# Patient Record
Sex: Female | Born: 1940 | Race: White | Hispanic: No | Smoking: Never smoker
Health system: Southern US, Community
[De-identification: ages and names within clinical notes are randomized; demographics above are authoritative.]

## PROBLEM LIST (undated history)

## (undated) DIAGNOSIS — K529 Noninfective gastroenteritis and colitis, unspecified: Secondary | ICD-10-CM

## (undated) DIAGNOSIS — K589 Irritable bowel syndrome without diarrhea: Secondary | ICD-10-CM

## (undated) DIAGNOSIS — R7303 Prediabetes: Secondary | ICD-10-CM

## (undated) DIAGNOSIS — D126 Benign neoplasm of colon, unspecified: Secondary | ICD-10-CM

## (undated) DIAGNOSIS — I10 Essential (primary) hypertension: Secondary | ICD-10-CM

## (undated) DIAGNOSIS — E7800 Pure hypercholesterolemia, unspecified: Secondary | ICD-10-CM

## (undated) DIAGNOSIS — M1990 Unspecified osteoarthritis, unspecified site: Secondary | ICD-10-CM

## (undated) DIAGNOSIS — R002 Palpitations: Secondary | ICD-10-CM

## (undated) DIAGNOSIS — K5790 Diverticulosis of intestine, part unspecified, without perforation or abscess without bleeding: Secondary | ICD-10-CM

## (undated) DIAGNOSIS — N390 Urinary tract infection, site not specified: Secondary | ICD-10-CM

## (undated) HISTORY — PX: COLONOSCOPY: SHX174

## (undated) HISTORY — DX: Palpitations: R00.2

## (undated) HISTORY — DX: Benign neoplasm of colon, unspecified: D12.6

## (undated) HISTORY — DX: Diverticulosis of intestine, part unspecified, without perforation or abscess without bleeding: K57.90

## (undated) HISTORY — PX: BILATERAL CARPAL TUNNEL RELEASE: SHX6508

## (undated) HISTORY — DX: Irritable bowel syndrome, unspecified: K58.9

---
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
## 2000-07-27 ENCOUNTER — Other Ambulatory Visit: Admission: RE | Admit: 2000-07-27 | Discharge: 2000-07-27 | Payer: Self-pay | Admitting: Obstetrics and Gynecology

## 2000-07-29 ENCOUNTER — Ambulatory Visit (HOSPITAL_COMMUNITY): Admission: RE | Admit: 2000-07-29 | Discharge: 2000-07-29 | Payer: Self-pay | Admitting: Obstetrics and Gynecology

## 2000-07-29 ENCOUNTER — Encounter: Payer: Self-pay | Admitting: Obstetrics and Gynecology

## 2001-08-01 ENCOUNTER — Other Ambulatory Visit: Admission: RE | Admit: 2001-08-01 | Discharge: 2001-08-01 | Payer: Self-pay | Admitting: Internal Medicine

## 2002-11-27 ENCOUNTER — Other Ambulatory Visit: Admission: RE | Admit: 2002-11-27 | Discharge: 2002-11-27 | Payer: Self-pay | Admitting: Obstetrics and Gynecology

## 2003-10-01 ENCOUNTER — Encounter (INDEPENDENT_AMBULATORY_CARE_PROVIDER_SITE_OTHER): Payer: Self-pay | Admitting: Gastroenterology

## 2003-12-31 ENCOUNTER — Other Ambulatory Visit: Admission: RE | Admit: 2003-12-31 | Discharge: 2003-12-31 | Payer: Self-pay | Admitting: Obstetrics and Gynecology

## 2005-01-29 ENCOUNTER — Other Ambulatory Visit: Admission: RE | Admit: 2005-01-29 | Discharge: 2005-01-29 | Payer: Self-pay | Admitting: Obstetrics and Gynecology

## 2005-09-06 ENCOUNTER — Encounter: Admission: RE | Admit: 2005-09-06 | Discharge: 2005-09-06 | Payer: Self-pay | Admitting: Obstetrics and Gynecology

## 2006-03-09 ENCOUNTER — Other Ambulatory Visit: Admission: RE | Admit: 2006-03-09 | Discharge: 2006-03-09 | Payer: Self-pay | Admitting: Obstetrics and Gynecology

## 2007-10-02 HISTORY — PX: NM MYOCAR PERF WALL MOTION: HXRAD629

## 2009-03-07 ENCOUNTER — Ambulatory Visit: Payer: Self-pay | Admitting: Gastroenterology

## 2009-04-03 ENCOUNTER — Ambulatory Visit: Payer: Self-pay | Admitting: Gastroenterology

## 2009-04-03 DIAGNOSIS — D126 Benign neoplasm of colon, unspecified: Secondary | ICD-10-CM | POA: Insufficient documentation

## 2009-04-07 ENCOUNTER — Telehealth: Payer: Self-pay | Admitting: Gastroenterology

## 2009-04-16 ENCOUNTER — Ambulatory Visit: Payer: Self-pay | Admitting: Gastroenterology

## 2010-10-29 HISTORY — PX: US ECHOCARDIOGRAPHY: HXRAD669

## 2010-12-15 NOTE — Procedures (Signed)
Summary: Colonoscopy   Colonoscopy  Procedure date:  10/01/2003  Findings:      Results: Diverticulosis.       Location:  [facility].    Comments:      Repeat colonoscopy in 5 years.   Procedures Next Due Date:    Colonoscopy: 10/2008 Patient Name: Jade, Hernandez MRN:  Procedure Procedures: Colonoscopy CPT: 47829.  Personnel: Endoscopist: Ulyess Mort, MD.  Exam Location: Exam performed in Outpatient Clinic. Outpatient  Patient Consent: Procedure, Alternatives, Risks and Benefits discussed, consent obtained, from patient. Consent was obtained by the RN.  Indications  Surveillance of: Adenomatous Polyp(s).  History  Current Medications: Patient is not currently taking Coumadin.  Pre-Exam Physical: Entire physical exam was normal.  Exam Exam: Extent of exam reached: Cecum, extent intended: Cecum.  The cecum was identified by appendiceal orifice and IC valve. Colon retroflexion performed. Images were not taken. ASA Classification: II. Tolerance: good.  Monitoring: Pulse and BP monitoring, Oximetry used. Supplemental O2 given.  Colon Prep Prep results: good.  Sedation Meds: Patient assessed and found to be appropriate for moderate (conscious) sedation. Fentanyl 125 mcg. given IV. Versed 12 given IV.  Findings - NOT SEEN ON EXAM: Cecum to Rectum. Polyps, AVM's, Colitis, Tumors, Melanosis, Crohn's, Hemorrhoids, Comments: minnimal diverticulosis   long,redundant colon.   Assessment Abnormal examination, see findings above.  Events  Unplanned Interventions: No intervention was required.  Unplanned Events: There were no complications. Plans Medication Plan: Continue current medications.  Patient Education: Patient given standard instructions for: Diverticulosis. Yearly hemoccult testing recommended. Patient instructed to get routine colonoscopy every 5 years.  Disposition: After procedure patient sent to recovery. After recovery  patient sent home.     cc: Desma Maxim, MD  This report was created from the original endoscopy report, which was reviewed and signed by the above listed endoscopist.

## 2010-12-15 NOTE — Assessment & Plan Note (Signed)
Summary: DISCUSS COL...AS.   History of Present Illness Visit Type: Initial Visit Primary GI MD: Melvia Heaps MD [facility] Primary Provider: Lynnea Ferrier Chief Complaint: Consult Colon Mrs. Wahler is a pleasant 70 year old female referred at the request of Dr.Cosiano  for followup colonoscopy.  She has a history of colon polyps.  On last examination in 2004 no polyps were seen.  The patient has no complaints of change in bowel habits, melena or hematochezia.  She has occasional discomfort in the right upper quadrant.   GI Review of Systems    Reports abdominal pain and  acid reflux.     Location of  Abdominal pain: LUQ.    Denies belching, bloating, chest pain, dysphagia with liquids, dysphagia with solids, heartburn, loss of appetite, nausea, vomiting, vomiting blood, weight loss, and  weight gain.        Denies anal fissure, black tarry stools, change in bowel habit, constipation, diarrhea, diverticulosis, fecal incontinence, heme positive stool, hemorrhoids, irritable bowel syndrome, jaundice, light color stool, liver problems, rectal bleeding, and  rectal pain. Preventive Screening-Counseling & Management     Smoking Status: never      Drug Use:  no.      Current Medications (verified): 1)  Metoprolol Tartrate 50 Mg Tabs (Metoprolol Tartrate) .Marland Kitchen.. 1 1/2 Tablet By Mouth Once Daily 2)  Hydrochlorothiazide 25 Mg Tabs (Hydrochlorothiazide) .Marland Kitchen.. 1 Tablet By Mouth Once Daily 3)  Simvastatin 40 Mg Tabs (Simvastatin) .Marland Kitchen.. 1 Tablet By Mouth Once Daily 4)  Adult Aspirin Ec Low Strength 81 Mg Tbec (Aspirin) .Marland Kitchen.. 1 Tablet By Mouth Once Daily 5)  Aciphex 20 Mg Tbec (Rabeprazole Sodium) .Marland Kitchen.. 1 Tablet By Mouth Every Other Day 6)  Mobic 15 Mg Tabs (Meloxicam) .Marland Kitchen.. 1 Tablet By Mouth At Bedtime 7)  Omega-3 1000 Mg Caps (Omega-3 Fatty Acids) .Marland Kitchen.. 1 Capsule By Mouth Three Times A Day 8)  Calcium 600/vitamin D 600-400 Mg-Unit Tabs (Calcium Carbonate-Vitamin D) .Marland Kitchen.. 1 Tablet By Mouth Three Times A  Day 9)  Vitamin D 1000 Unit Tabs (Cholecalciferol) .Marland Kitchen.. 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        Alcohol Use - no       Daily Caffeine Use       Illicit Drug Use - no    Smoking Status:  never    Drug Use:  no  Review of Systems       The patient complains of allergy/sinus, arthritis/joint pain, and urination - excessive.         All other systems were reviewed and were negative   Vital Signs:  Patient profile:   70 year old female Height:      64 inches Weight:      180.25 pounds BMI:     31.05 Pulse rate:   80 / minute Pulse  rhythm:   regular BP sitting:   126 / 78  (left arm)  Vitals Entered By: Milford Cage CMA (Apr 03, 2009 3:06 PM)  Physical Exam  Additional Exam:  She is a healthy-appearing female  skin: anicteric HEENT: normocephalic; PEERLA; no nasal or pharyngeal abnormalities neck: supple nodes: no cervical lymphadenopathy chest: clear to ausculatation and percussion heart: no murmurs, gallops, or rubs abd: soft, nontender; BS normoactive; no abdominal masses, tenderness, organomegaly rectal: deferred ext: no cynanosis, clubbing, edema skeletal: no deformities neuro: oriented x 3; no focal abnormalities    Impression & Recommendations:  Problem # 1:  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  2)  Conscious Sedation brochure given.  3)  Your procedure is scheduled for 04/16/2009 at 3pm 4)  You can pick up your Movi-Prep to day from your pharmacy 5)  cc Dr. Pecola Lawless 6)  The medication list was reviewed and reconciled.  All changed / newly prescribed medications were explained.  A complete medication list was provided to the patient / caregiver. Prescriptions: MOVIPREP 100 GM  SOLR (PEG-KCL-NACL-NASULF-NA ASC-C) As per prep instructions.  #1 x 0   Entered by:   Merri Ray CMA   Authorized by:   Louis Meckel MD   Signed by:   Merri Ray CMA on 04/03/2009   Method used:   Electronically to        [facility] [address] [phone]* (retail)       [address]       [place], [place]  [address]       Ph: [phone] or [phone]       Fax: [phone]   RxID:   [id number]

## 2010-12-15 NOTE — Procedures (Signed)
Summary: Colonoscopy   Colonoscopy  Procedure date:  04/16/2009  Findings:      [facility]    Procedures Next Due Date:    Colonoscopy: 04/2019  COLONOSCOPY PROCEDURE REPORT  PATIENT:  Jade Hernandez, Jade Hernandez  MR#:  [id number] BIRTHDATE:   Jun 23, 1941, 68 yrs. old   GENDER:   female  ENDOSCOPIST:   Barbette Hair. Arlyce Dice, MD Referred by: Desma Maxim, M.D.  PROCEDURE DATE:  04/16/2009 PROCEDURE:  Colonoscopy, diagnostic ASA CLASS:   Class II INDICATIONS: history of pre-cancerous (adenomatous) colon polyps Last exam 2004  MEDICATIONS:    Fentanyl 75 mcg IV, Versed 10 mg IV  DESCRIPTION OF PROCEDURE:   After the risks benefits and alternatives of the procedure were thoroughly explained, informed consent was obtained.  Digital rectal exam was performed and revealed no abnormalities.   The LB [id number] endoscope was introduced through the anus and advanced to the cecum, which was identified by both the appendix and ileocecal valve, without limitations.  The quality of the prep was excellent, using MoviPrep.  The instrument was then slowly withdrawn as the colon was fully examined. <<PROCEDUREIMAGES>>                    <<OLD IMAGES>>  FINDINGS:  Scattered diverticula were found in the proximal transverse colon (see image4).  This was otherwise a normal examination of the colon (see image1, image2, image3, image5, image6, image7, image8, image10, image12, and image13).   Retroflexed views in the rectum revealed no abnormalities.    The scope was then withdrawn from the patient and the procedure completed.  COMPLICATIONS:   None  ENDOSCOPIC IMPRESSION:  1) Diverticula, scattered in the proximal transverse colon  2) Otherwise normal examination RECOMMENDATIONS:  1) colonoscopy  10 years  REPEAT EXAM:   In 10 year(s) for Colonoscopy.   _______________________________ Barbette Hair. Arlyce Dice, MD  CC:

## 2010-12-15 NOTE — Miscellaneous (Signed)
Summary: Pre Meds removed(VFW)  Clinical Lists Changes  Medications: Removed medication of MIRALAX   POWD (POLYETHYLENE GLYCOL 3350) As per prep  instructions. Removed medication of DULCOLAX 5 MG  TBEC (BISACODYL) Day before procedure take 2 at 3pm and 2 at 8pm. Removed medication of REGLAN 10 MG  TABS (METOCLOPRAMIDE HCL) As per prep instructions.

## 2010-12-15 NOTE — Miscellaneous (Signed)
Summary: PV; mirlax  Clinical Lists Changes  Medications: Added new medication of MIRALAX   POWD (POLYETHYLENE GLYCOL 3350) As per prep  instructions. - Signed Added new medication of DULCOLAX 5 MG  TBEC (BISACODYL) Day before procedure take 2 at 3pm and 2 at 8pm. - Signed Added new medication of REGLAN 10 MG  TABS (METOCLOPRAMIDE HCL) As per prep instructions. - Signed Rx of MIRALAX   POWD (POLYETHYLENE GLYCOL 3350) As per prep  instructions.;  #255gm x 0;  Signed;  Entered by: Kyra Searles RN II;  Authorized by: Louis Meckel MD;  Method used: [facility]  [address] [phone]*, [address], [place], [place]  [address], Ph: [phone] or [phone], Fax: [phone] Rx of DULCOLAX 5 MG  TBEC (BISACODYL) Day before procedure take 2 at 3pm and 2 at 8pm.;  #4 x 0;  Signed;  Entered by: Kyra Searles RN II;  Authorized by: Louis Meckel MD;  Method used: [facility]  [address] [phone]*, [address], [place], [place]  [address], Ph: [phone] or [phone], Fax: [phone] Rx of REGLAN 10 MG  TABS (METOCLOPRAMIDE HCL) As per prep instructions.;  #2 x 0;  Signed;  Entered by: Kyra Searles RN II;  Authorized by: Louis Meckel MD;  Method used: [facility]  [address] [phone]*, [address] [place], [place]  [address], Ph: [phone] or [phone], Fax: [phone] Observations: Added new observation of NKA: T (03/07/2009 15:47)    Prescriptions: REGLAN 10 MG  TABS (METOCLOPRAMIDE HCL) As per prep instructions.  #2 x 0   Entered by:   Kyra Searles RN II   Authorized by:   Louis Meckel MD   Signed by:   Kyra Searles RN II on 03/07/2009   Method used:   [facility]        [facility]  [address] [phone]* (retail)       [address]       [place], [place]  [address]       Ph: [phone] or [phone]       Fax: [phone]   RxID:   [id number] DULCOLAX 5 MG  TBEC (BISACODYL) Day before procedure take 2 at 3pm  and 2 at 8pm.  #4 x 0   Entered by:   Kyra Searles RN II   Authorized by:   Louis Meckel MD   Signed by:   Kyra Searles RN II on 03/07/2009   Method used:   [facility]        [facility]  [address] [phone]* (retail)       [address]       [place], [place]  [address]       Ph: [phone] or [phone]       Fax: [phone]   RxID:   [id number] MIRALAX   POWD (POLYETHYLENE GLYCOL 3350) As per prep  instructions.  #255gm x 0   Entered by:   Kyra Searles RN II   Authorized by:   Louis Meckel MD   Signed by:   Kyra Searles RN II on 03/07/2009   Method used:   [facility]        [facility]  [address] [phone][facility] (retail)       [address]       [place], [place]  [address]       Ph: [phone] or [phone]  Fax: [phone]   RxID:   [id number]   Appended Document: PV; mirlax Pt decided to schedule dr's appointment to discuss other options to colonoscopy (XRAY; camera, etc); procedure cancelled; called pharmacy; spoke with Kenard Gower; all meds entered for this procedure have been cancelled

## 2010-12-15 NOTE — Letter (Signed)
Summary: Results Letter   Gastroenterology  [address] [place], [place] [address]   Phone: [phone]  Fax: [phone]        Apr 03, 2009 MRN: [id number]    [facility] [address] [place], [place]  [address]    Dear Ms. Jade Hernandez,  It is my pleasure to have treated you recently as a new patient in my office. I appreciate your confidence and the opportunity to participate in your care.  Since I do have a busy inpatient endoscopy schedule and office schedule, my office hours vary weekly. I am, however, available for emergency calls everyday through my office. If I am not available for an urgent office appointment, another one of our gastroenterologist will be able to assist you.  My well-trained staff are prepared to help you at all times. For emergencies after office hours, a physician from our Gastroenterology section is always available through my 24 hour answering service  Once again I welcome you as a new patient and I look forward to a happy and healthy relationship             Sincerely,  Louis Meckel MD  This letter has been electronically signed by your physician.  Appended Document: Results Letter letter mailed

## 2010-12-15 NOTE — Progress Notes (Signed)
Summary: updates meds  Phone Note Call from Patient Call back at [phone]   Caller: Patient Call For: Shacara Cozine Reason for Call: Talk to Nurse Summary of Call: Patient wants to speak to nurse regarding medication she is taking and does not know if she told the nurse bout it.( procedure is on 04-16-09) Initial call taken by: Tawni Levy,  Apr 07, 2009 9:43 AM  Follow-up for Phone Call        Pt instructed to write down name of med and bring with her the day of the procedure. Follow-up by: Ezra Sites RN,  Apr 07, 2009 10:46 AM

## 2011-12-07 DIAGNOSIS — E782 Mixed hyperlipidemia: Secondary | ICD-10-CM | POA: Diagnosis not present

## 2011-12-09 DIAGNOSIS — M899 Disorder of bone, unspecified: Secondary | ICD-10-CM | POA: Diagnosis not present

## 2011-12-09 DIAGNOSIS — M949 Disorder of cartilage, unspecified: Secondary | ICD-10-CM | POA: Diagnosis not present

## 2011-12-09 DIAGNOSIS — M1710 Unilateral primary osteoarthritis, unspecified knee: Secondary | ICD-10-CM | POA: Diagnosis not present

## 2011-12-31 DIAGNOSIS — R300 Dysuria: Secondary | ICD-10-CM | POA: Diagnosis not present

## 2011-12-31 DIAGNOSIS — N318 Other neuromuscular dysfunction of bladder: Secondary | ICD-10-CM | POA: Diagnosis not present

## 2012-01-18 DIAGNOSIS — M949 Disorder of cartilage, unspecified: Secondary | ICD-10-CM | POA: Diagnosis not present

## 2012-01-18 DIAGNOSIS — M25569 Pain in unspecified knee: Secondary | ICD-10-CM | POA: Diagnosis not present

## 2012-01-18 DIAGNOSIS — M899 Disorder of bone, unspecified: Secondary | ICD-10-CM | POA: Diagnosis not present

## 2012-01-20 DIAGNOSIS — R3915 Urgency of urination: Secondary | ICD-10-CM | POA: Diagnosis not present

## 2012-01-20 DIAGNOSIS — R300 Dysuria: Secondary | ICD-10-CM | POA: Diagnosis not present

## 2012-01-20 DIAGNOSIS — N390 Urinary tract infection, site not specified: Secondary | ICD-10-CM | POA: Diagnosis not present

## 2012-01-20 DIAGNOSIS — N3020 Other chronic cystitis without hematuria: Secondary | ICD-10-CM | POA: Diagnosis not present

## 2012-02-03 DIAGNOSIS — N390 Urinary tract infection, site not specified: Secondary | ICD-10-CM | POA: Diagnosis not present

## 2012-03-29 DIAGNOSIS — E669 Obesity, unspecified: Secondary | ICD-10-CM | POA: Diagnosis not present

## 2012-03-29 DIAGNOSIS — Z0000 Encounter for general adult medical examination without abnormal findings: Secondary | ICD-10-CM | POA: Diagnosis not present

## 2012-03-29 DIAGNOSIS — E782 Mixed hyperlipidemia: Secondary | ICD-10-CM | POA: Diagnosis not present

## 2012-03-29 DIAGNOSIS — I10 Essential (primary) hypertension: Secondary | ICD-10-CM | POA: Diagnosis not present

## 2012-03-29 DIAGNOSIS — M1990 Unspecified osteoarthritis, unspecified site: Secondary | ICD-10-CM | POA: Diagnosis not present

## 2012-04-19 DIAGNOSIS — Z1231 Encounter for screening mammogram for malignant neoplasm of breast: Secondary | ICD-10-CM | POA: Diagnosis not present

## 2012-06-17 ENCOUNTER — Encounter (HOSPITAL_COMMUNITY): Payer: Self-pay | Admitting: *Deleted

## 2012-06-17 ENCOUNTER — Observation Stay (HOSPITAL_COMMUNITY)
Admission: EM | Admit: 2012-06-17 | Discharge: 2012-06-18 | Disposition: A | Discharge status: Home / Self Care | Payer: Medicare Other | Attending: Internal Medicine | Admitting: Internal Medicine

## 2012-06-17 DIAGNOSIS — K5730 Diverticulosis of large intestine without perforation or abscess without bleeding: Secondary | ICD-10-CM | POA: Diagnosis not present

## 2012-06-17 DIAGNOSIS — I10 Essential (primary) hypertension: Secondary | ICD-10-CM | POA: Diagnosis not present

## 2012-06-17 DIAGNOSIS — K922 Gastrointestinal hemorrhage, unspecified: Secondary | ICD-10-CM | POA: Diagnosis not present

## 2012-06-17 DIAGNOSIS — M1710 Unilateral primary osteoarthritis, unspecified knee: Secondary | ICD-10-CM | POA: Diagnosis not present

## 2012-06-17 DIAGNOSIS — K5900 Constipation, unspecified: Secondary | ICD-10-CM | POA: Diagnosis present

## 2012-06-17 DIAGNOSIS — M179 Osteoarthritis of knee, unspecified: Secondary | ICD-10-CM | POA: Diagnosis present

## 2012-06-17 DIAGNOSIS — K5790 Diverticulosis of intestine, part unspecified, without perforation or abscess without bleeding: Secondary | ICD-10-CM

## 2012-06-17 DIAGNOSIS — D126 Benign neoplasm of colon, unspecified: Secondary | ICD-10-CM

## 2012-06-17 HISTORY — DX: Unspecified osteoarthritis, unspecified site: M19.90

## 2012-06-17 HISTORY — DX: Pure hypercholesterolemia, unspecified: E78.00

## 2012-06-17 HISTORY — DX: Essential (primary) hypertension: I10

## 2012-06-17 HISTORY — DX: Urinary tract infection, site not specified: N39.0

## 2012-06-17 LAB — COMPREHENSIVE METABOLIC PANEL
ALT: 12 U/L (ref 0–35)
AST: 16 U/L (ref 0–37)
Albumin: 3.5 g/dL (ref 3.5–5.2)
Alkaline Phosphatase: 48 U/L (ref 39–117)
BUN: 15 mg/dL (ref 6–23)
CO2: 28 mEq/L (ref 19–32)
Calcium: 9.2 mg/dL (ref 8.4–10.5)
Chloride: 106 mEq/L (ref 96–112)
Creatinine, Ser: 0.52 mg/dL (ref 0.50–1.10)
GFR calc Af Amer: >90 mL/min (ref >90)
GFR calc non Af Amer: >90 mL/min (ref >90)
Glucose, Bld: 119 mg/dL — ABNORMAL HIGH (ref 70–99)
Potassium: 3.9 mEq/L (ref 3.5–5.1)
Sodium: 142 mEq/L (ref 135–145)
Total Bilirubin: 0.5 mg/dL (ref 0.3–1.2)
Total Protein: 6.7 g/dL (ref 6.0–8.3)

## 2012-06-17 LAB — CBC
HCT: 38.8 % (ref 36.0–46.0)
Hemoglobin: 12.7 g/dL (ref 12.0–15.0)
MCH: 27.4 pg (ref 26.0–34.0)
MCHC: 32.7 g/dL (ref 30.0–36.0)
MCV: 83.6 fL (ref 78.0–100.0)
Platelets: 240 10*3/uL (ref 150–400)
RBC: 4.64 MIL/uL (ref 3.87–5.11)
RDW: 13.2 % (ref 11.5–15.5)
WBC: 7.8 10*3/uL (ref 4.0–10.5)

## 2012-06-17 LAB — OCCULT BLOOD, POC DEVICE: Fecal Occult Bld: POSITIVE

## 2012-06-17 MED ORDER — SIMVASTATIN 40 MG PO TABS
40.0000 mg | ORAL_TABLET | Freq: Every evening | ORAL | Status: DC
  Administered 2012-06-17: 40 mg via ORAL
  Filled 2012-06-17 (×2): qty 1

## 2012-06-17 MED ORDER — FIBER SELECT GUMMIES PO CHEW: 1.0000 | CHEWABLE_TABLET | ORAL | Status: DC

## 2012-06-17 MED ORDER — VITAMIN D3 25 MCG (1000 UNIT) PO TABS
4000.0000 [IU] | ORAL_TABLET | Freq: Every day | ORAL | Status: DC
  Administered 2012-06-17: 4000 [IU] via ORAL
  Filled 2012-06-17 (×2): qty 4

## 2012-06-17 MED ORDER — PNEUMOCOCCAL VAC POLYVALENT 25 MCG/0.5ML IJ INJ
0.5000 mL | INJECTION | INTRAMUSCULAR | Status: DC
  Filled 2012-06-17: qty 0.5

## 2012-06-17 MED ORDER — ASPIRIN EC 81 MG PO TBEC
81.0000 mg | DELAYED_RELEASE_TABLET | Freq: Every day | ORAL | Status: DC
  Filled 2012-06-17: qty 1

## 2012-06-17 MED ORDER — OMEGA-3-ACID ETHYL ESTERS 1 G PO CAPS
1.0000 g | ORAL_CAPSULE | Freq: Every day | ORAL | Status: DC
  Administered 2012-06-17: 1 g via ORAL
  Filled 2012-06-17 (×2): qty 1

## 2012-06-17 MED ORDER — VITAMIN D 50 MCG (2000 UT) PO CAPS: 4000.0000 [IU] | ORAL_CAPSULE | Freq: Every day | ORAL | Status: DC

## 2012-06-17 MED ORDER — METOPROLOL SUCCINATE ER 50 MG PO TB24
75.0000 mg | ORAL_TABLET | Freq: Every day | ORAL | Status: DC
  Filled 2012-06-17: qty 1

## 2012-06-17 MED ORDER — ACETAMINOPHEN 650 MG RE SUPP: 650.0000 mg | Freq: Four times a day (QID) | RECTAL | Status: DC | PRN

## 2012-06-17 MED ORDER — ACETAMINOPHEN 325 MG PO TABS: 650.0000 mg | ORAL_TABLET | Freq: Four times a day (QID) | ORAL | Status: DC | PRN

## 2012-06-17 MED ORDER — MELOXICAM 15 MG PO TABS
15.0000 mg | ORAL_TABLET | Freq: Every day | ORAL | Status: DC | PRN
  Filled 2012-06-17: qty 1

## 2012-06-17 MED ORDER — NITROFURANTOIN MONOHYD MACRO 100 MG PO CAPS
100.0000 mg | ORAL_CAPSULE | Freq: Every evening | ORAL | Status: DC
  Administered 2012-06-17: 100 mg via ORAL
  Filled 2012-06-17 (×3): qty 1

## 2012-06-17 MED ORDER — CAPSAICIN 0.075 % EX CREA
TOPICAL_CREAM | Freq: Two times a day (BID) | CUTANEOUS | Status: DC
  Administered 2012-06-17 (×2): via TOPICAL
  Filled 2012-06-17: qty 60

## 2012-06-17 MED ORDER — DOCUSATE SODIUM 100 MG PO CAPS
100.0000 mg | ORAL_CAPSULE | Freq: Two times a day (BID) | ORAL | Status: DC
  Administered 2012-06-17: 100 mg via ORAL
  Filled 2012-06-17 (×3): qty 1

## 2012-06-17 MED ORDER — SODIUM CHLORIDE 0.9 % IV SOLN
INTRAVENOUS | Status: AC
  Administered 2012-06-17: 14:00:00 via INTRAVENOUS

## 2012-06-17 MED ORDER — PANTOPRAZOLE SODIUM 40 MG PO TBEC
40.0000 mg | DELAYED_RELEASE_TABLET | Freq: Every day | ORAL | Status: DC
  Administered 2012-06-17: 40 mg via ORAL
  Filled 2012-06-17: qty 1

## 2012-06-17 MED ORDER — SODIUM CHLORIDE 0.9 % IJ SOLN: 3.0000 mL | Freq: Two times a day (BID) | INTRAMUSCULAR | Status: DC

## 2012-06-17 NOTE — ED Provider Notes (Signed)
History     CSN: [id number]  Arrival date & time 06/17/12  0817   First MD Initiated Contact with Patient 06/17/12 [phone]      Chief Complaint  Patient presents with  . Rectal Bleeding    (Consider location/radiation/quality/duration/timing/severity/associated sxs/prior treatment) HPI Comments: Started this morning with loose, bloody stools.  No pain or discomfort.  Had colonoscopy about 3 years ago that was okay.    Patient is a 71 y.o. female presenting with hematochezia. The history is provided by the patient.  Rectal Bleeding  The current episode started today. The onset was sudden. The problem occurs continuously. The problem has been resolved. The patient is experiencing no pain. The stool is described as liquid and bloody. There was no prior successful therapy. There was no prior unsuccessful therapy.    Past Medical History  Diagnosis Date  . Hypertension   . High cholesterol   . Arthritis   . UTI (urinary tract infection)     History reviewed. No pertinent past surgical history.  History reviewed. No pertinent family history.  History  Substance Use Topics  . Smoking status: Not on file  . Smokeless tobacco: Not on file  . Alcohol Use: No    OB History    Grav Para Term Preterm Abortions TAB SAB Ect Mult Living                  Review of Systems  Gastrointestinal: Positive for hematochezia.  All other systems reviewed and are negative.    Allergies  Review of patient's allergies indicates no known allergies.  Home Medications  No current outpatient prescriptions on file.  BP 146/82  Pulse 80  Temp 99.2 F (37.3 C) (Oral)  Resp 18  SpO2 97%  Physical Exam  Nursing note and vitals reviewed. Constitutional: She is oriented to person, place, and time. She appears well-developed and well-nourished. No distress.  HENT:  Head: Normocephalic and atraumatic.  Neck: Normal range of motion. Neck supple.  Cardiovascular: Normal rate and regular  rhythm.  Exam reveals no gallop and no friction rub.   No murmur heard. Pulmonary/Chest: Effort normal and breath sounds normal. No respiratory distress. She has no wheezes.  Abdominal: Soft. Bowel sounds are normal. She exhibits no distension. There is no tenderness.  Genitourinary:       Rectal exam unremarkable.  Scant stool.  Musculoskeletal: Normal range of motion.  Neurological: She is alert and oriented to person, place, and time.  Skin: Skin is warm and dry. She is not diaphoretic.    ED Course  Procedures (including critical care time)   Labs Reviewed  CBC  COMPREHENSIVE METABOLIC PANEL   No results found.   No diagnosis found.    MDM  The Hb and WBC are unremarkable, the exam is benign, and there is no fever.  Although she has been on two antibiotics recently, I doubt C-Diff.  Her colonoscopy from 2010 shows diverticuli in the transverse colon.  I suspect that this is the site of the bleeding.    I have spoken with Dr. Christella Hartigan from GI who wants patient admitted to [facility] for observation.        Geoffery Lyons, MD 06/17/12 1125

## 2012-06-17 NOTE — ED Notes (Signed)
Pt's daughter has pictures of the BM on her phone.  Blood is visible in toilet.  Pt states that the last time she had a BM, there was very little blood.

## 2012-06-17 NOTE — ED Notes (Signed)
Reports intermittent abd pain, had bowel movement this am with dark blood in stools. No acute distress noted at triage

## 2012-06-17 NOTE — H&P (Signed)
[facility] History and Physical  Jade Hernandez WUJ:[id number] DOB: 07/07/1941 DOA: 06/17/2012  Referring physician: ER PCP: [name], MD   Chief Complaint:  Chief Complaint  Patient presents with  . Rectal Bleeding     HPI:  71 year old woman with history of diverticulosis presents to the emergency room after she had 4 bloody bowel movements today. Denies any nausea vomiting. Reports history of chronic constipation  Review of Systems:  Positive for chronic bilateral knee pains, positive for occasional epigastric pains after eating, negative for chest pain shortness of breath. All other systems reviewed and negative  Past Medical History  Diagnosis Date  . Hypertension   . High cholesterol   . Arthritis   . UTI (urinary tract infection)    History reviewed. No pertinent past surgical history. Social History:  does not have a smoking history on file. She does not have any smokeless tobacco history on file. She reports that she does not drink alcohol or use illicit drugs. 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 Authorizing Provider  aspirin EC 81 MG tablet Take 81 mg by mouth daily.   Yes Historical Provider, MD  Calcium Carbonate-Vit D-Min (CALCIUM 1200 PO) Take 1 tablet by mouth daily.   Yes Historical Provider, MD  Cholecalciferol (VITAMIN D) 2000 UNITS CAPS Take 4,000 Units by mouth daily.   Yes Historical Provider, MD  FIBER SELECT GUMMIES CHEW Chew 1 tablet by mouth 2 (two) times a week.   Yes Historical Provider, MD  hydrochlorothiazide (HYDRODIURIL) 25 MG tablet Take 25 mg by mouth daily.   Yes Historical Provider, MD  ibandronate (BONIVA) 150 MG tablet Take 150 mg by mouth every 30 (thirty) days. Take in the morning with a full  glass of water, on an empty stomach, and do not take anything else by mouth or lie down for the next 30 min.- the 1st week of every month   Yes Historical Provider, MD  meloxicam (MOBIC) 15 MG tablet Take 15 mg by mouth daily as needed. For knee pain   Yes Historical Provider, MD  metoprolol succinate (TOPROL-XL) 50 MG 24 hr tablet Take 75 mg by mouth daily. Take with or immediately following a meal.   Yes Historical Provider, MD  nitrofurantoin, macrocrystal-monohydrate, (MACROBID) 100 MG capsule Take 100 mg by mouth every evening. For 3 months to prevent urinary tract infections   Yes Historical Provider, MD  Omega-3 Fatty Acids (FISH OIL) 1200 MG CAPS Take 1 capsule by mouth 2 (two) times a week.   Yes Historical Provider, MD  omeprazole (PRILOSEC) 20 MG capsule Take 20 mg by mouth every other day.   Yes Historical Provider, MD  simvastatin (ZOCOR) 40 MG tablet Take 40 mg by mouth every evening.   Yes Historical Provider, MD   Physical Exam: Filed Vitals:   06/17/12 0830 06/17/12 1125 06/17/12 1311  BP: 146/82 132/76 138/75  Pulse: 80 76 77  Temp: 99.2 F (37.3 C)  98.5 F (36.9 C)  TempSrc: Oral  Oral  Resp: 18 18 18   Height:   5\' 4"  (1.626 m)  Weight:   82.1 kg (181 lb)  SpO2: 97% 98% 96%     General:  Alert oriented x3  Eyes: Pupil equal and round react to light accommodation extraocular  movements intact conjunctiva pink sclera anicteric  ENT: Normal appearing ears, throat without pharyngeal exudate  Neck: No thyromegaly no jugular venous distention  Cardiovascular: Regular rate and rhythm, normal S1-S2  Respiratory: Clear to auscultation bilaterally without wheeze rhonchi crackles  Abdomen: Soft nontender bowel sounds are present  Skin: Warm dry without suspicious rashes  Musculoskeletal: Changes of bilateral osteoarthritis in the knees  Psychiatric: Euthymic  Neurologic: Cranial nerves 2-12 intact, strength intact all 4 extremities, deep tendon reflexes intact,  sensation intact  Labs on Admission:  Basic Metabolic Panel:  Lab 06/17/12 1610  NA 142  K 3.9  CL 106  CO2 28  GLUCOSE 119*  BUN 15  CREATININE 0.52  CALCIUM 9.2  MG --  PHOS --   Liver Function Tests:  Lab 06/17/12 0902  AST 16  ALT 12  ALKPHOS 48  BILITOT 0.5  PROT 6.7  ALBUMIN 3.5   No results found for this basename: LIPASE:5,AMYLASE:5 in the last 168 hours No results found for this basename: AMMONIA:5 in the last 168 hours CBC:  Lab 06/17/12 0902  WBC 7.8  NEUTROABS --  HGB 12.7  HCT 38.8  MCV 83.6  PLT 240   Cardiac Enzymes: No results found for this basename: CKTOTAL:5,CKMB:5,CKMBINDEX:5,TROPONINI:5 in the last 168 hours  BNP (last 3 results) No results found for this basename: PROBNP:3 in the last 8760 hours CBG: No results found for this basename: GLUCAP:5 in the last 168 hours  Radiological Exams on Admission: No results found.  EKG: Independently reviewed. No significant ST changes  Assessment/Plan Principal Problem:  *GI bleed Active Problems:  Benign neoplasm of colon  Diverticulosis  Hypertension  Osteoarthritis of knee  Constipation   1. Lower gastrointestinal bleed-most likely diverticular bleed - no signs of hemodynamic instability. We'll observe overnight to see if the bleeding recurs. Otherwise patient will followup with Dr. Arlyce Dice in the office. 2. Severe constipation-we'll discontinue calcium - start Colace twice a day 3. 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

## 2012-06-18 ENCOUNTER — Encounter (HOSPITAL_COMMUNITY): Payer: Self-pay | Admitting: *Deleted

## 2012-06-18 DIAGNOSIS — M1710 Unilateral primary osteoarthritis, unspecified knee: Secondary | ICD-10-CM

## 2012-06-18 DIAGNOSIS — K922 Gastrointestinal hemorrhage, unspecified: Secondary | ICD-10-CM | POA: Diagnosis not present

## 2012-06-18 DIAGNOSIS — IMO0002 Reserved for concepts with insufficient information to code with codable children: Secondary | ICD-10-CM

## 2012-06-18 DIAGNOSIS — K5900 Constipation, unspecified: Secondary | ICD-10-CM | POA: Diagnosis not present

## 2012-06-18 DIAGNOSIS — I10 Essential (primary) hypertension: Secondary | ICD-10-CM | POA: Diagnosis not present

## 2012-06-18 LAB — BASIC METABOLIC PANEL
BUN: 11 mg/dL (ref 6–23)
CO2: 27 mEq/L (ref 19–32)
Calcium: 8.8 mg/dL (ref 8.4–10.5)
Chloride: 107 mEq/L (ref 96–112)
Creatinine, Ser: 0.42 mg/dL — ABNORMAL LOW (ref 0.50–1.10)
GFR calc Af Amer: >90 mL/min (ref >90)
GFR calc non Af Amer: >90 mL/min (ref >90)
Glucose, Bld: 107 mg/dL — ABNORMAL HIGH (ref 70–99)
Potassium: 3.6 mEq/L (ref 3.5–5.1)
Sodium: 141 mEq/L (ref 135–145)

## 2012-06-18 LAB — CBC
HCT: 32.8 % — ABNORMAL LOW (ref 36.0–46.0)
Hemoglobin: 11 g/dL — ABNORMAL LOW (ref 12.0–15.0)
MCH: 28.3 pg (ref 26.0–34.0)
MCHC: 33.5 g/dL (ref 30.0–36.0)
MCV: 84.3 fL (ref 78.0–100.0)
Platelets: 173 10*3/uL (ref 150–400)
RBC: 3.89 MIL/uL (ref 3.87–5.11)
RDW: 13.4 % (ref 11.5–15.5)
WBC: 4.1 10*3/uL (ref 4.0–10.5)

## 2012-06-18 MED ORDER — DSS 100 MG PO CAPS: 100.0000 mg | ORAL_CAPSULE | Freq: Two times a day (BID) | ORAL | Status: AC | PRN

## 2012-06-18 MED ORDER — CAPSAICIN 0.075 % EX CREA: TOPICAL_CREAM | Freq: Two times a day (BID) | CUTANEOUS | Status: DC

## 2012-06-18 MED ORDER — POLYETHYLENE GLYCOL 3350 17 G PO PACK
34.0000 g | PACK | Freq: Once | ORAL | Status: DC
  Filled 2012-06-18: qty 2

## 2012-06-18 NOTE — Discharge Summary (Signed)
Physician Discharge Summary  Jade Hernandez VHQ:[id number] DOB: 03/31/1941 DOA: 06/17/2012  PCP: [name], MD  Admit date: 06/17/2012 Discharge date: 06/18/2012  Recommendations for Outpatient Follow-up:  1. Patient to return to her primary care physician for routine followup. We have recommended discontinuation of calcium as to prevent further constipation. She should followup with gastroenterology when indicated by her normal colon cancer screening schedule.  Discharge Diagnoses:  Minor GI bleed - most likely diverticular bleed-self resolved  Diverticulosis  Hypertension  Osteoarthritis of knee  Constipation   Discharge Condition: Good  Diet recommendation: Heart healthy  Wt Readings from Last 3 Encounters:  06/17/12 82.1 kg (181 lb)  04/03/09 81.761 kg (180 lb 4 oz)    History of present illness:  71 year old woman with known diverticulosis presented after 4 bloody bowel movements  Hospital Course:  #1 hematochezia - this actually self resolved prior to admission. We observe the patient overnight to make sure he did not recur. Patient remained hemodynamically stable. We presumed that the etiology of hematochezia was diverticular bleed. We recommended patient discontinue calcium as to decrease his constipation. On hospital day #2 the patient had a normal bowel movement without blood and we recommended discharge home   Procedures:  None  Consultations:  None  Discharge Exam: Filed Vitals:   06/18/12 0553  BP: 122/70  Pulse: 69  Temp: 97.6 F (36.4 C)  Resp: 18   Filed Vitals:   06/17/12 1125 06/17/12 1311 06/17/12 2116 06/18/12 0553  BP: 132/76 138/75 116/71 122/7  Pulse: 76 77 74 69  Temp:  98.5 F (36.9 C) 98.4 F (36.9 C) 97.6 F (36.4 C)  TempSrc:  Oral Oral Oral  Resp: 18 18 18 18   Height:  5\' 4"  (1.626 m)    Weight:  82.1 kg (181 lb)    SpO2: 98% 96% 97% 98%    General: Alert oriented x3 Cardiovascular: Regular rate and rhythm Respiratory:  Clear to auscultation bilaterally Abdomen is soft nontender  Discharge Instructions  Discharge Orders    Future Orders Please Complete By Expires   Diet general      Increase activity slowly        Medication List  As of 06/18/2012 10:09 AM   STOP taking these medications         CALCIUM 1200 PO         TAKE these medications         aspirin EC 81 MG tablet   Take 81 mg by mouth daily.      capsicum 0.075 % topical cream   Commonly known as: ZOSTRIX   Apply topically 2 (two) times daily. Apply to both knees      DSS 100 MG Caps   Take 100 mg by mouth 2 (two) times daily as needed for constipation.      FIBER SELECT GUMMIES Chew   Chew 1 tablet by mouth 2 (two) times a week.      Fish Oil 1200 MG Caps   Take 1 capsule by mouth 2 (two) times a week.      hydrochlorothiazide 25 MG tablet   Commonly known as: HYDRODIURIL   Take 25 mg by mouth daily.      ibandronate 150 MG tablet   Commonly known as: BONIVA   Take 150 mg by mouth every 30 (thirty) days. Take in the morning with a full glass of water, on an empty stomach, and do not take anything else by mouth or lie down for  the next 30 min.- the 1st week of every month      meloxicam 15 MG tablet   Commonly known as: MOBIC   Take 15 mg by mouth daily as needed. For knee pain      metoprolol succinate 50 MG 24 hr tablet   Commonly known as: TOPROL-XL   Take 75 mg by mouth daily. Take with or immediately following a meal.      nitrofurantoin (macrocrystal-monohydrate) 100 MG capsule   Commonly known as: MACROBID   Take 100 mg by mouth every evening. For 3 months to prevent urinary tract infections      omeprazole 20 MG capsule   Commonly known as: PRILOSEC   Take 20 mg by mouth every other day.      simvastatin 40 MG tablet   Commonly known as: ZOCOR   Take 40 mg by mouth every evening.      Vitamin D 2000 UNITS Caps   Take 4,000 Units by mouth daily.           Follow-up Information    Schedule an  appointment as soon as possible for a visit with [name], MD.   Contact information:   [address] [place] [address] [phone]           The results of significant diagnostics from this hospitalization (including imaging, microbiology, ancillary and laboratory) are listed below for reference.    Significant Diagnostic Studies: No results found.  Microbiology: No results found for this or any previous visit (from the past 240 hour(s)).   Labs: Basic Metabolic Panel:  Lab 06/18/12 2130 06/17/12 0902  NA 141 142  K 3.6 3.9  CL 107 106  CO2 27 28  GLUCOSE 107* 119*  BUN 11 15  CREATININE 0.42* 0.52  CALCIUM 8.8 9.2  MG -- --  PHOS -- --   Liver Function Tests:  Lab 06/17/12 0902  AST 16  ALT 12  ALKPHOS 48  BILITOT 0.5  PROT 6.7  ALBUMIN 3.5   No results found for this basename: LIPASE:5,AMYLASE:5 in the last 168 hours No results found for this basename: AMMONIA:5 in the last 168 hours CBC:  Lab 06/18/12 0815 06/17/12 0902  WBC 4.1 7.8  NEUTROABS -- --  HGB 11.0* 12.7  HCT 32.8* 38.8  MCV 84.3 83.6  PLT 173 240    Signed:  Linsay Vogt  [facility] 06/18/2012, 10:09 AM

## 2012-06-18 NOTE — Progress Notes (Signed)
Pt discharging home via wheelchair however she is able to get oob self and walk with steady gait, pt alert, oriented, skin intact, no further bleeding since prior to admission yesterday, discharge instructions discussed with pt and husband, copy given, IV rt forearm d/c without problem, telemetry d/c.

## 2012-06-26 ENCOUNTER — Telehealth: Payer: Self-pay | Admitting: Gastroenterology

## 2012-06-26 NOTE — Telephone Encounter (Signed)
Pt states she was in the hospital recently for a diverticular bleed, blood in her stool. Requesting to be seen for follow-up. Pt scheduled to see Mike Gip PA tomorrow at 9:30am. Pt aware of appt date and time.

## 2012-06-27 ENCOUNTER — Encounter: Payer: Self-pay | Admitting: Physician Assistant

## 2012-06-27 ENCOUNTER — Ambulatory Visit (INDEPENDENT_AMBULATORY_CARE_PROVIDER_SITE_OTHER): Payer: Medicare Other | Admitting: Physician Assistant

## 2012-06-27 ENCOUNTER — Other Ambulatory Visit (INDEPENDENT_AMBULATORY_CARE_PROVIDER_SITE_OTHER): Payer: Medicare Other

## 2012-06-27 VITALS — BP 128/80 | HR 79 | Ht 64.0 in | Wt 172.0 lb

## 2012-06-27 DIAGNOSIS — K5731 Diverticulosis of large intestine without perforation or abscess with bleeding: Secondary | ICD-10-CM

## 2012-06-27 LAB — CBC WITH DIFFERENTIAL/PLATELET
Basophils Absolute: 0 10*3/uL (ref 0.0–0.1)
Basophils Relative: 1 % (ref 0.0–3.0)
Eosinophils Absolute: 0 10*3/uL (ref 0.0–0.7)
Eosinophils Relative: 1 % (ref 0.0–5.0)
HCT: 34.6 % — ABNORMAL LOW (ref 36.0–46.0)
Hemoglobin: 11.2 g/dL — ABNORMAL LOW (ref 12.0–15.0)
Lymphocytes Relative: 23 % (ref 12.0–46.0)
Lymphs Abs: 1.1 10*3/uL (ref 0.7–4.0)
MCHC: 32.3 g/dL (ref 30.0–36.0)
MCV: 83.5 fl (ref 78.0–100.0)
Monocytes Absolute: 0.5 10*3/uL (ref 0.1–1.0)
Monocytes Relative: 11 % (ref 3.0–12.0)
Neutro Abs: 3.2 10*3/uL (ref 1.4–7.7)
Neutrophils Relative %: 64 % (ref 43.0–77.0)
Platelets: 295 10*3/uL (ref 150.0–400.0)
RBC: 4.15 Mil/uL (ref 3.87–5.11)
RDW: 14 % (ref 11.5–14.6)
WBC: 5 10*3/uL (ref 4.5–10.5)

## 2012-06-27 MED ORDER — ALIGN PO CAPS: 1.0000 | ORAL_CAPSULE | Freq: Every day | ORAL | Status: AC

## 2012-06-27 NOTE — Patient Instructions (Signed)
You will take Align daily for 3 weeks Samples given

## 2012-06-27 NOTE — Progress Notes (Signed)
Subjective:    Patient ID: Jade Hernandez, female    DOB: Oct 10, 1941, 71 y.o.   MRN: [id number]  HPI Japji is a pleasant 71 year old female known to Dr. Arlyce Dice who last had colonoscopy in 2010. This showed scattered diverticulosis from the transverse to the descending colon and no polyps. She does have prior history of adenomatous polyps. Patient just had a hospital admission 06/17/2012 through 06/18/2012 with an acute diverticular bleed which was self-limited. Patient has not had any history of previous GI bleeding. She says she had acute onset of bloody stool on the morning of admission without any abdominal pain or cramping. She said she had 4 episodes of bleeding and passed all a lot of dark red blood Her bleeding resolved that same day. She did not require transfusion. Hemoglobin was 12.7 on admission and 11.0 on discharge on 06/18/2012.  Patient says she had been constipated because she was taking a lot of calcium and wonders if that aggravated the bleeding. She also takes 1 baby aspirin daily and had been taking Mobic for knee pain. Since discharge she says she has felt a bit tired but otherwise has been fine and has had normal bowel movements without any evidence of blood. She stopped calcium supplements has been using an ointment  for her arthritic pain ,which seems to be helping and has not been taking the Mobic. She does have questions that she's been having some increased abdominal gas and bloating over the past week ,but says she has been taking prophylactic Bactrim because of recurrent urinary tract infections and is also on a penicillin because  of a root canal.    Review of Systems  Constitutional: Positive for fatigue.  HENT: Negative.   Eyes: Negative.   Respiratory: Negative.   Cardiovascular: Negative.   Gastrointestinal: Negative.   Genitourinary: Negative.   Musculoskeletal: Negative.   Neurological: Negative.   Hematological: Negative.   Psychiatric/Behavioral:  Negative.    Outpatient Prescriptions Prior to Visit  Medication Sig Dispense Refill  . aspirin EC 81 MG tablet Take 81 mg by mouth daily.      Marland Kitchen docusate sodium 100 MG CAPS Take 100 mg by mouth 2 (two) times daily as needed for constipation.  10 capsule    . FIBER SELECT GUMMIES CHEW Chew 1 tablet by mouth 2 (two) times a week.      . hydrochlorothiazide (HYDRODIURIL) 25 MG tablet Take 25 mg by mouth daily.      Marland Kitchen ibandronate (BONIVA) 150 MG tablet Take 150 mg by mouth every 30 (thirty) days. Take in the morning with a full glass of water, on an empty stomach, and do not take anything else by mouth or lie down for the next 30 min.- the 1st week of every month      . meloxicam (MOBIC) 15 MG tablet Take 15 mg by mouth daily as needed. For knee pain      . metoprolol succinate (TOPROL-XL) 50 MG 24 hr tablet Take 75 mg by mouth daily. Take with or immediately following a meal.      . nitrofurantoin, macrocrystal-monohydrate, (MACROBID) 100 MG capsule Take 100 mg by mouth every evening. For 3 months to prevent urinary tract infections      . omeprazole (PRILOSEC) 20 MG capsule Take 20 mg by mouth every other day.      . simvastatin (ZOCOR) 40 MG tablet Take 40 mg by mouth every evening.      . Cholecalciferol (VITAMIN D) 2000 UNITS CAPS  Take 4,000 Units by mouth daily.      . Omega-3 Fatty Acids (FISH OIL) 1200 MG CAPS Take 1 capsule by mouth 2 (two) times a week.      . capsicum (ZOSTRIX) 0.075 % topical cream Apply topically 2 (two) times daily. Apply to both knees  28.3 g  0   No Known Allergies     Patient Active Problem List  Diagnosis  . Benign neoplasm of colon  . Diverticulosis  . GI bleed  . Hypertension  . Osteoarthritis of knee  . Constipation   History   Social History  . Marital Status: Married    Spouse Name: N/A    Number of Children: N/A  . Years of Education: N/A   Occupational History  . Not on file.   Social History Main Topics  . Smoking status: Never Smoker    . Smokeless tobacco: Never Used  . Alcohol Use: No  . Drug Use: No  . Sexually Active:    Other Topics Concern  . Not on file   Social History Narrative   Cup of coffee a day    Objective:   Physical Exam well-developed older female in no acute distress, pleasant company by her daughter blood pressure 128/80 pulse 78 height 5 foot 4 weight 172. HEENT; nontraumatic, normocephalic, EOMI, PERRLA sclera anicteric, Neck;Supple no JVD, Cardiovascular; regular rate and rhythm with S1-S2 no murmur or gallop, Pulmonary; clear bilaterally, Abdomen; soft nontender bowel sounds active no palpable mass or hepatosplenomegaly, Rectal; exam not done, Extremities; trace edema in the ankles bilaterally, Psych; mood and affect normal and appropriate.        Assessment & Plan:  #33 71 year old female stable status post self-limited diverticular bleed requiring overnight hospitalization but no transfusions.  #2 anemia mild secondary to acute blood loss #3 remote history of colon polyps, negative colonoscopy 2010 plan followup at 10 year interval  Plan; long discussion today regarding diverticular disease and diverticular bleeding. Patient to advance to regular diet, and will stay off of calcium supplements and Mobic and will resume her baby Aspirin. Will repeat CBC today Start Align one by mouth daily over the next 2-3 weeks while taking antibiotics Followup with Dr. Arlyce Dice as needed

## 2012-06-27 NOTE — Progress Notes (Signed)
I agree with assessment and plans 

## 2012-06-30 DIAGNOSIS — K922 Gastrointestinal hemorrhage, unspecified: Secondary | ICD-10-CM | POA: Diagnosis not present

## 2012-06-30 DIAGNOSIS — K5730 Diverticulosis of large intestine without perforation or abscess without bleeding: Secondary | ICD-10-CM | POA: Diagnosis not present

## 2012-07-07 DIAGNOSIS — D485 Neoplasm of uncertain behavior of skin: Secondary | ICD-10-CM | POA: Diagnosis not present

## 2012-08-07 ENCOUNTER — Other Ambulatory Visit: Payer: Self-pay | Admitting: Obstetrics and Gynecology

## 2012-08-07 DIAGNOSIS — Z124 Encounter for screening for malignant neoplasm of cervix: Secondary | ICD-10-CM | POA: Diagnosis not present

## 2012-08-22 DIAGNOSIS — N3020 Other chronic cystitis without hematuria: Secondary | ICD-10-CM | POA: Diagnosis not present

## 2012-08-22 DIAGNOSIS — R300 Dysuria: Secondary | ICD-10-CM | POA: Diagnosis not present

## 2012-08-29 DIAGNOSIS — D3000 Benign neoplasm of unspecified kidney: Secondary | ICD-10-CM | POA: Diagnosis not present

## 2012-08-29 DIAGNOSIS — N3020 Other chronic cystitis without hematuria: Secondary | ICD-10-CM | POA: Diagnosis not present

## 2012-08-29 DIAGNOSIS — R3915 Urgency of urination: Secondary | ICD-10-CM | POA: Diagnosis not present

## 2012-08-31 DIAGNOSIS — R229 Localized swelling, mass and lump, unspecified: Secondary | ICD-10-CM | POA: Diagnosis not present

## 2012-08-31 DIAGNOSIS — Z23 Encounter for immunization: Secondary | ICD-10-CM | POA: Diagnosis not present

## 2012-09-27 DIAGNOSIS — H524 Presbyopia: Secondary | ICD-10-CM | POA: Diagnosis not present

## 2012-09-27 DIAGNOSIS — H25019 Cortical age-related cataract, unspecified eye: Secondary | ICD-10-CM | POA: Diagnosis not present

## 2012-09-27 DIAGNOSIS — H5210 Myopia, unspecified eye: Secondary | ICD-10-CM | POA: Diagnosis not present

## 2012-10-05 DIAGNOSIS — E782 Mixed hyperlipidemia: Secondary | ICD-10-CM | POA: Diagnosis not present

## 2012-10-05 DIAGNOSIS — N390 Urinary tract infection, site not specified: Secondary | ICD-10-CM | POA: Diagnosis not present

## 2012-10-05 DIAGNOSIS — M1990 Unspecified osteoarthritis, unspecified site: Secondary | ICD-10-CM | POA: Diagnosis not present

## 2012-10-05 DIAGNOSIS — I10 Essential (primary) hypertension: Secondary | ICD-10-CM | POA: Diagnosis not present

## 2012-10-30 DIAGNOSIS — N390 Urinary tract infection, site not specified: Secondary | ICD-10-CM | POA: Diagnosis not present

## 2012-10-30 DIAGNOSIS — R3915 Urgency of urination: Secondary | ICD-10-CM | POA: Diagnosis not present

## 2012-11-29 DIAGNOSIS — N3020 Other chronic cystitis without hematuria: Secondary | ICD-10-CM | POA: Diagnosis not present

## 2012-11-29 DIAGNOSIS — R3915 Urgency of urination: Secondary | ICD-10-CM | POA: Diagnosis not present

## 2013-01-08 DIAGNOSIS — I119 Hypertensive heart disease without heart failure: Secondary | ICD-10-CM | POA: Diagnosis not present

## 2013-01-08 DIAGNOSIS — E782 Mixed hyperlipidemia: Secondary | ICD-10-CM | POA: Diagnosis not present

## 2013-03-07 DIAGNOSIS — D485 Neoplasm of uncertain behavior of skin: Secondary | ICD-10-CM | POA: Diagnosis not present

## 2013-03-07 DIAGNOSIS — R234 Changes in skin texture: Secondary | ICD-10-CM | POA: Diagnosis not present

## 2013-03-21 DIAGNOSIS — R259 Unspecified abnormal involuntary movements: Secondary | ICD-10-CM | POA: Diagnosis not present

## 2013-05-02 DIAGNOSIS — Z1231 Encounter for screening mammogram for malignant neoplasm of breast: Secondary | ICD-10-CM | POA: Diagnosis not present

## 2013-05-03 DIAGNOSIS — N63 Unspecified lump in unspecified breast: Secondary | ICD-10-CM | POA: Diagnosis not present

## 2013-06-08 DIAGNOSIS — M1990 Unspecified osteoarthritis, unspecified site: Secondary | ICD-10-CM | POA: Diagnosis not present

## 2013-06-08 DIAGNOSIS — M899 Disorder of bone, unspecified: Secondary | ICD-10-CM | POA: Diagnosis not present

## 2013-06-08 DIAGNOSIS — Z0000 Encounter for general adult medical examination without abnormal findings: Secondary | ICD-10-CM | POA: Diagnosis not present

## 2013-06-08 DIAGNOSIS — I10 Essential (primary) hypertension: Secondary | ICD-10-CM | POA: Diagnosis not present

## 2013-06-08 DIAGNOSIS — M949 Disorder of cartilage, unspecified: Secondary | ICD-10-CM | POA: Diagnosis not present

## 2013-06-08 DIAGNOSIS — E669 Obesity, unspecified: Secondary | ICD-10-CM | POA: Diagnosis not present

## 2013-06-08 DIAGNOSIS — E782 Mixed hyperlipidemia: Secondary | ICD-10-CM | POA: Diagnosis not present

## 2013-06-14 ENCOUNTER — Encounter: Payer: Self-pay | Admitting: Cardiovascular Disease

## 2013-08-08 DIAGNOSIS — Z124 Encounter for screening for malignant neoplasm of cervix: Secondary | ICD-10-CM | POA: Diagnosis not present

## 2013-08-23 DIAGNOSIS — L723 Sebaceous cyst: Secondary | ICD-10-CM | POA: Diagnosis not present

## 2013-09-01 ENCOUNTER — Other Ambulatory Visit: Payer: Self-pay | Admitting: Cardiovascular Disease

## 2013-09-04 NOTE — Telephone Encounter (Signed)
Rx was sent to pharmacy electronically. 

## 2013-09-05 DIAGNOSIS — Z23 Encounter for immunization: Secondary | ICD-10-CM | POA: Diagnosis not present

## 2013-09-24 DIAGNOSIS — M899 Disorder of bone, unspecified: Secondary | ICD-10-CM | POA: Diagnosis not present

## 2013-09-24 DIAGNOSIS — M1710 Unilateral primary osteoarthritis, unspecified knee: Secondary | ICD-10-CM | POA: Diagnosis not present

## 2013-09-24 DIAGNOSIS — M722 Plantar fascial fibromatosis: Secondary | ICD-10-CM | POA: Diagnosis not present

## 2013-10-01 DIAGNOSIS — D3100 Benign neoplasm of unspecified conjunctiva: Secondary | ICD-10-CM | POA: Diagnosis not present

## 2013-10-01 DIAGNOSIS — H25019 Cortical age-related cataract, unspecified eye: Secondary | ICD-10-CM | POA: Diagnosis not present

## 2013-10-01 DIAGNOSIS — H52209 Unspecified astigmatism, unspecified eye: Secondary | ICD-10-CM | POA: Diagnosis not present

## 2013-10-03 DIAGNOSIS — R300 Dysuria: Secondary | ICD-10-CM | POA: Diagnosis not present

## 2013-10-03 DIAGNOSIS — N318 Other neuromuscular dysfunction of bladder: Secondary | ICD-10-CM | POA: Diagnosis not present

## 2013-10-30 DIAGNOSIS — M899 Disorder of bone, unspecified: Secondary | ICD-10-CM | POA: Diagnosis not present

## 2013-11-02 DIAGNOSIS — N318 Other neuromuscular dysfunction of bladder: Secondary | ICD-10-CM | POA: Diagnosis not present

## 2013-12-03 DIAGNOSIS — R3915 Urgency of urination: Secondary | ICD-10-CM | POA: Diagnosis not present

## 2013-12-03 DIAGNOSIS — N281 Cyst of kidney, acquired: Secondary | ICD-10-CM | POA: Diagnosis not present

## 2013-12-11 DIAGNOSIS — M818 Other osteoporosis without current pathological fracture: Secondary | ICD-10-CM | POA: Diagnosis not present

## 2013-12-11 DIAGNOSIS — M1710 Unilateral primary osteoarthritis, unspecified knee: Secondary | ICD-10-CM | POA: Diagnosis not present

## 2013-12-13 DIAGNOSIS — H6120 Impacted cerumen, unspecified ear: Secondary | ICD-10-CM | POA: Diagnosis not present

## 2013-12-28 DIAGNOSIS — J111 Influenza due to unidentified influenza virus with other respiratory manifestations: Secondary | ICD-10-CM | POA: Diagnosis not present

## 2013-12-28 DIAGNOSIS — B9789 Other viral agents as the cause of diseases classified elsewhere: Secondary | ICD-10-CM | POA: Diagnosis not present

## 2013-12-28 DIAGNOSIS — R52 Pain, unspecified: Secondary | ICD-10-CM | POA: Diagnosis not present

## 2014-01-11 ENCOUNTER — Ambulatory Visit (INDEPENDENT_AMBULATORY_CARE_PROVIDER_SITE_OTHER): Payer: Medicare Other | Admitting: Cardiovascular Disease

## 2014-01-11 ENCOUNTER — Encounter: Payer: Self-pay | Admitting: Cardiovascular Disease

## 2014-01-11 VITALS — BP 140/82 | HR 69 | Ht 63.5 in | Wt 185.0 lb

## 2014-01-11 DIAGNOSIS — I10 Essential (primary) hypertension: Secondary | ICD-10-CM | POA: Diagnosis not present

## 2014-01-11 DIAGNOSIS — E785 Hyperlipidemia, unspecified: Secondary | ICD-10-CM | POA: Diagnosis not present

## 2014-01-11 DIAGNOSIS — Z8249 Family history of ischemic heart disease and other diseases of the circulatory system: Secondary | ICD-10-CM | POA: Diagnosis not present

## 2014-01-11 DIAGNOSIS — K219 Gastro-esophageal reflux disease without esophagitis: Secondary | ICD-10-CM | POA: Insufficient documentation

## 2014-01-11 NOTE — Patient Instructions (Signed)
Your physician recommends that you schedule a follow-up appointment in: 6 months  

## 2014-01-11 NOTE — Progress Notes (Signed)
Patient ID: Jade Hernandez, female   DOB: Apr 25, 1941, 73 y.o.   MRN: [id number]     HPI: Jade Hernandez is a 73 y.o. female who presents to the office for one year cardiology evaluation.  Jade Hernandez is a 73 year old female who has a strong family history for premature coronary disease and has a history of hypertension, palpitations, and hyperlipidemia. In December 2011 and ECHO Doppler study showed proximal septal thickening without outflow tract obstruction, normal LV systolic function but grade 1 diastolic dysfunction. There is mild LA dilatation, mild e calcification with mild MR and TR and mild aortic valve sclerosis with mild AR. A nuclear perfusion study in 2008 revealed normal perfusion.  The past year, she has done well on a medical regimen with simvastatin 40 mg for hyperlipidemia. She takes Toprol-XL 75 mg and HCTZ 25 mg for palpitations and hypertension. She is on Prilosec for GERD.  Recently, she has noticed that her blood pressure has been tending to stay in the range of 140. She denies any chest tightness. She denies dyspnea. He denies palpitations. She presents for evaluation.  I did obtain records from usual physicians and review the laboratory that she had from 06/08/2013. At that time creatinine was 0.52 with a BUN of 18. U/A was negative. Cholesterol was 154 triglycerides 138 HDL 50 LDL 76.  She presents for one-year evaluation  Past Medical History  Diagnosis Date  . Hypertension   . High cholesterol   . Arthritis   . UTI (urinary tract infection)   . Diverticulosis     Past Surgical History  Procedure Laterality Date  . Colonoscopy      No Known Allergies  Current Outpatient Prescriptions  Medication Sig Dispense Refill  . aspirin EC 81 MG tablet Take 81 mg by mouth daily.      . Cholecalciferol (VITAMIN D) 2000 UNITS CAPS Take 4,000 Units by mouth daily.      . hydrochlorothiazide (HYDRODIURIL) 25 MG tablet Take 25 mg by mouth daily.      Marland Kitchen ibandronate  (BONIVA) 150 MG tablet Take 150 mg by mouth every 30 (thirty) days. Take in the morning with a full glass of water, on an empty stomach, and do not take anything else by mouth or lie down for the next 30 min.- the 1st week of every month      . meloxicam (MOBIC) 15 MG tablet Take 15 mg by mouth daily as needed. For knee pain      . metoprolol succinate (TOPROL-XL) 50 MG 24 hr tablet TAKE 1 AND 1/2 TABLETS DAILY  45 tablet  6  . omeprazole (PRILOSEC) 20 MG capsule Take 20 mg by mouth every other day.      . simvastatin (ZOCOR) 40 MG tablet Take 40 mg by mouth every evening.       No current facility-administered medications for this visit.    History   Social History  . Marital Status: Married    Spouse Name: N/A    Number of Children: N/A  . Years of Education: N/A   Occupational History  . Not on file.   Social History Main Topics  . Smoking status: Never Smoker   . Smokeless tobacco: Never Used  . Alcohol Use: No  . Drug Use: No  . Sexual Activity:    Other Topics Concern  . Not on file   Social History Narrative   Cup of coffee a day   She is married and has 4  children 9 grandchildren. Her brother is my patient Mr. Reather Converse.  She states she cannot walk due to knee discomfort and often times tries to march in place for exercise.  Family History  Problem Relation Age of Onset  . Heart disease Father     died from heart attack  . Hypertension Mother   . Diabetes Mother   . Heart disease Brother     ROS is negative for fevers, chills or night sweats.   She denies any significant weight gain. She denies change in vision or hearing. She is unaware of lymphadenopathy. She denies rash. She denies cough or increased sputum production. There is no wheezing. She denies chest wall pain. She denies chest pressure. She is unaware of recent palpitations on her beta blocker therapy. At times she does have some mild GERD. She denies leg edema. She denies nausea vomiting or diarrhea.  She is unaware of blood in stool or urine. There is no claudication. She's not having difficulty with sleep. She is not diabetic. There is no cold or heat intolerance. Other comprehensive 14 point system review is negative.  PE BP 140/82  Pulse 69  Ht 5' 3.5" (1.613 m)  Wt 185 lb (83.915 kg)  BMI 32.25 kg/m2  General: Alert, oriented, no distress.  Skin: normal turgor, no rashes HEENT: Normocephalic, atraumatic. Pupils round and reactive; sclera anicteric;no lid lag. Extraocular muscles intact;; no xanthelasmas. Nose without nasal septal hypertrophy Mouth/Parynx benign; Mallinpatti scale 2 Neck: No JVD, no carotid bruits; normal carotid upstroke Lungs: clear to ausculatation and percussion; no wheezing or rales Chest wall: no tenderness to palpitation Heart: RRR, s1 s2 normal; 1/6 systolic murmur;no diastolic murmur, rub thrills or heaves Abdomen: soft, nontender; no hepatosplenomehaly, BS+; abdominal aorta nontender and not dilated by palpation. Back: no CVA tenderness Pulses 2+ Extremities: no clubbing cyanosis or edema, Homan's sign negative  Neurologic: grossly nonfocal; cranial nerves grossly normal. Psychologic: normal affect and mood.  ECG (independently read by me): Normal sinus rhythm at 69 beats per minute. Mild RV conduction delay. No ectopy.  LABS:  BMET    Component Value Date/Time   NA 141 06/18/2012 0815   K 3.6 06/18/2012 0815   CL 107 06/18/2012 0815   CO2 27 06/18/2012 0815   GLUCOSE 107* 06/18/2012 0815   BUN 11 06/18/2012 0815   CREATININE 0.42* 06/18/2012 0815   CALCIUM 8.8 06/18/2012 0815   GFRNONAA >90 06/18/2012 0815   GFRAA >90 06/18/2012 0815     Hepatic Function Panel     Component Value Date/Time   PROT 6.7 06/17/2012 0902   ALBUMIN 3.5 06/17/2012 0902   AST 16 06/17/2012 0902   ALT 12 06/17/2012 0902   ALKPHOS 48 06/17/2012 0902   BILITOT 0.5 06/17/2012 0902     CBC    Component Value Date/Time   WBC 5.0 06/27/2012 1032   RBC 4.15 06/27/2012 1032   HGB 11.2*  06/27/2012 1032   HCT 34.6* 06/27/2012 1032   PLT 295.0 06/27/2012 1032   MCV 83.5 06/27/2012 1032   MCH 28.3 06/18/2012 0815   MCHC 32.3 06/27/2012 1032   RDW 14.0 06/27/2012 1032   LYMPHSABS 1.1 06/27/2012 1032   MONOABS 0.5 06/27/2012 1032   EOSABS 0.0 06/27/2012 1032   BASOSABS 0.0 06/27/2012 1032     BNP No results found for this basename: probnp    Lipid Panel  No results found for this basename: chol, trig, hdl, cholhdl, vldl, ldlcalc     RADIOLOGY: No  results found.    ASSESSMENT AND PLAN:  A long discussion with both Jade Hernandez and her husband who is with her today. Her blood pressure today was in the 527 systolic range. Upon further questioning she has been using salt in her foods which may also be contributing to her mild blood pressure elevation. She is on Toprol-XL at 75 mg with a resting pulse in the 60s. She also is on HCTZ at 25 mg. I have recommended weight loss as well as significant sodium restriction. She will continue to monitor her blood pressure. Her blood pressure consistently is staying in the 140 range she may benefit from the addition of lisinopril at low dose initially a 2.5 mg per medical regimen. She tells me she will followup with her primary physician, Dr. [name]. Her lipid studies are good on her current dose of simvastatin. She has normal renal function. Her body mass index is 32.25 concordant with mild obesity. I will see her in 6 months to a year depending upon her clinical status.    Troy Sine, MD, [facility]  01/11/2014 2:48 PM

## 2014-02-04 ENCOUNTER — Telehealth: Payer: Self-pay | Admitting: *Deleted

## 2014-02-04 NOTE — Telephone Encounter (Signed)
Pt stated that she saw Dr. Claiborne Billings a few weeks back and they discussed her taking some BP pills. At the time it was not prescribed, she feels that she needs to take it now and wants a Rx for it.  TK

## 2014-02-04 NOTE — Telephone Encounter (Signed)
Returned call and pt verified x 2.  Pt stated she takes her BP meds about 6 am.  Then checks BP about 1-2 hours later and BP 140s/70s initially and then 120s/70s after 5 mins.  Pt wants Dr. Claiborne Billings to prescribe the other BP med he mentioned at her visit.  RN asked pt if she has cut back on salt and started losing weight.  Pt stated she has been trying.  Pt advised to come in for BP check as RN explained BP 140s/70s w/ drop to 120s/70s in 5 mins should be checked.  Pt agreed and BP check scheduled for 3.25.15 at 9:30am w/ Tommy Medal, PharmD.  Pt informed she can prescribe med if needed.  Pt verbalized understanding and agreed w/ plan.

## 2014-02-06 ENCOUNTER — Ambulatory Visit (INDEPENDENT_AMBULATORY_CARE_PROVIDER_SITE_OTHER): Payer: Medicare Other | Admitting: Pharmacist Clinician (PhC)/ Clinical Pharmacy Specialist

## 2014-02-06 VITALS — BP 130/76 | HR 68 | Ht 64.0 in | Wt 185.5 lb

## 2014-02-06 DIAGNOSIS — I10 Essential (primary) hypertension: Secondary | ICD-10-CM | POA: Diagnosis not present

## 2014-02-06 NOTE — Patient Instructions (Signed)
Return for a a follow up appointment if blood pressure is consistently above 150/90  Your blood pressure today is 130/76  Check your blood pressure at home 2-3 times per week.   No change in medications today     HOW TO TAKE YOUR BLOOD PRESSURE:   Rest 5 minutes before taking your blood pressure.    Don't smoke or drink caffeinated beverages for at least 30 minutes before.   Take your blood pressure before (not after) you eat.   Sit comfortably with your back supported and both feet on the floor (don't cross your legs).   Elevate your arm to heart level on a table or a desk.   Use the proper sized cuff. It should fit smoothly and snugly around your bare upper arm. There should be enough room to slip a fingertip under the cuff. The bottom edge of the cuff should be 1 inch above the crease of the elbow.   Ideally, take 3 measurements at one sitting and record the average.

## 2014-02-07 ENCOUNTER — Encounter: Payer: Self-pay | Admitting: Pharmacist Clinician (PhC)/ Clinical Pharmacy Specialist

## 2014-02-07 NOTE — Progress Notes (Signed)
02/07/2014 Jade Hernandez Jan 19, 1941 [id number]   HPI:  Jade Hernandez is a 73 y.o. female patient of Dr Claiborne Billings, with a PMH below who presents today for a blood pressure check.  She saw Dr. Claiborne Billings several weeks ago and her pressure was stable at 140/82, however there were concerns because her home readings were running as high as 462 systolic.  Jade Hernandez states compliance with her medications, for blood pressure control she takes metoprolol succ 75mg  and hctz 25mg , both each morning.  She does have an active prescription for meloxicam, which she uses for knee pain, but she states that she rarely takes more than 1-2 tablets per week.  She has never smoked or used any tobacco products.  She does drink the occasional alcoholic beverage and likes her morning cup of Turkish coffee.  She has cut salt out of her diet as much as possible, although her family owns a [employer], so she does eat some meals there.  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  Today she brings her home BP cuff in for calibration.  Home readings have ranged from 120/67 to 160/72.  Her family history is significant in that her father died from a MI in his 100s while living in [place].  Her mother suffered from hypertension as do many of her 8 brothers.  She had one sister who is now deceased.      Current Outpatient Prescriptions  Medication Sig Dispense Refill  . aspirin EC 81 MG tablet Take 81 mg by mouth daily.      . Cholecalciferol (VITAMIN D) 2000 UNITS CAPS Take 4,000 Units by mouth daily.      . hydrochlorothiazide (HYDRODIURIL) 25 MG tablet Take 25 mg by mouth daily.      Marland Kitchen ibandronate (BONIVA) 150 MG tablet Take 150 mg by mouth every 30 (thirty) days. Take in the morning with a full glass of water, on an empty stomach, and do not take anything else by mouth or lie down for the next 30 min.- the 1st week of every month      .  meloxicam (MOBIC) 15 MG tablet Take 15 mg by mouth daily as needed. For knee pain      . metoprolol succinate (TOPROL-XL) 50 MG 24 hr tablet TAKE 1 AND 1/2 TABLETS DAILY  45 tablet  6  . omeprazole (PRILOSEC) 20 MG capsule Take 20 mg by mouth every other day.      . simvastatin (ZOCOR) 40 MG tablet Take 40 mg by mouth every evening.       No current facility-administered medications for this visit.    No Known Allergies  Past Medical History  Diagnosis Date  . Hypertension   . High cholesterol   . Arthritis   . UTI (urinary tract infection)   . Diverticulosis     Blood pressure 130/76, pulse 68, height 5\' 4"  (1.626 m), weight 185 lb 8 oz (84.142 kg). Standing 124/66   ASSESSMENT AND PLAN: Today Jade Hernandez blood pressure is well within normal limits.  She is not diabetic, nor do her labs indicate any problems with kidney function, therefore her goal BP is to remain <150/90.  We tested her home BP cuff, a newer [employer] model, and it was accurate within 5 points of the office reading.  For now I believe she is well controlled.  I have asked that  she continue to monitor her home BP 2-3 times per week and let us know if the readings trend to >150/90.  Otherwise I have asked her to continue monitoring her sodium intake and stay active.  We talked about the use of meloxicam and its potential to increase BP if used on a daily basis.  She understands this and hopefully will not increase her use of this medication.    Jade Hernandez PharmD CPP [facility]

## 2014-03-19 DIAGNOSIS — N63 Unspecified lump in unspecified breast: Secondary | ICD-10-CM | POA: Diagnosis not present

## 2014-03-21 DIAGNOSIS — N6459 Other signs and symptoms in breast: Secondary | ICD-10-CM | POA: Diagnosis not present

## 2014-03-29 ENCOUNTER — Other Ambulatory Visit: Payer: Self-pay

## 2014-03-29 MED ORDER — METOPROLOL SUCCINATE ER 50 MG PO TB24: ORAL_TABLET | ORAL | Status: DC

## 2014-03-29 NOTE — Telephone Encounter (Signed)
Rx was sent to pharmacy electronically. 

## 2014-06-16 DIAGNOSIS — H6120 Impacted cerumen, unspecified ear: Secondary | ICD-10-CM | POA: Diagnosis not present

## 2014-07-09 DIAGNOSIS — Z6833 Body mass index (BMI) 33.0-33.9, adult: Secondary | ICD-10-CM | POA: Diagnosis not present

## 2014-07-09 DIAGNOSIS — Z23 Encounter for immunization: Secondary | ICD-10-CM | POA: Diagnosis not present

## 2014-07-09 DIAGNOSIS — I10 Essential (primary) hypertension: Secondary | ICD-10-CM | POA: Diagnosis not present

## 2014-07-09 DIAGNOSIS — E669 Obesity, unspecified: Secondary | ICD-10-CM | POA: Diagnosis not present

## 2014-07-09 DIAGNOSIS — M949 Disorder of cartilage, unspecified: Secondary | ICD-10-CM | POA: Diagnosis not present

## 2014-07-09 DIAGNOSIS — E782 Mixed hyperlipidemia: Secondary | ICD-10-CM | POA: Diagnosis not present

## 2014-07-09 DIAGNOSIS — Z0000 Encounter for general adult medical examination without abnormal findings: Secondary | ICD-10-CM | POA: Diagnosis not present

## 2014-07-09 DIAGNOSIS — M899 Disorder of bone, unspecified: Secondary | ICD-10-CM | POA: Diagnosis not present

## 2014-07-09 DIAGNOSIS — M1990 Unspecified osteoarthritis, unspecified site: Secondary | ICD-10-CM | POA: Diagnosis not present

## 2014-08-20 ENCOUNTER — Other Ambulatory Visit: Payer: Self-pay | Admitting: Obstetrics and Gynecology

## 2014-08-20 DIAGNOSIS — Z01419 Encounter for gynecological examination (general) (routine) without abnormal findings: Secondary | ICD-10-CM | POA: Diagnosis not present

## 2014-08-20 DIAGNOSIS — Z124 Encounter for screening for malignant neoplasm of cervix: Secondary | ICD-10-CM | POA: Diagnosis not present

## 2014-08-21 LAB — CYTOLOGY - PAP

## 2014-09-03 DIAGNOSIS — Z23 Encounter for immunization: Secondary | ICD-10-CM | POA: Diagnosis not present

## 2014-10-07 DIAGNOSIS — H25013 Cortical age-related cataract, bilateral: Secondary | ICD-10-CM | POA: Diagnosis not present

## 2014-10-07 DIAGNOSIS — D3101 Benign neoplasm of right conjunctiva: Secondary | ICD-10-CM | POA: Diagnosis not present

## 2014-11-20 DIAGNOSIS — M1712 Unilateral primary osteoarthritis, left knee: Secondary | ICD-10-CM | POA: Diagnosis not present

## 2014-11-20 DIAGNOSIS — M1711 Unilateral primary osteoarthritis, right knee: Secondary | ICD-10-CM | POA: Diagnosis not present

## 2015-01-22 DIAGNOSIS — M1711 Unilateral primary osteoarthritis, right knee: Secondary | ICD-10-CM | POA: Diagnosis not present

## 2015-01-22 DIAGNOSIS — M1712 Unilateral primary osteoarthritis, left knee: Secondary | ICD-10-CM | POA: Diagnosis not present

## 2015-01-22 DIAGNOSIS — M170 Bilateral primary osteoarthritis of knee: Secondary | ICD-10-CM | POA: Diagnosis not present

## 2015-01-23 ENCOUNTER — Other Ambulatory Visit: Payer: Self-pay | Admitting: Cardiovascular Disease

## 2015-01-23 NOTE — Telephone Encounter (Signed)
Rx refill sent to patient pharmacy   

## 2015-01-29 DIAGNOSIS — H9190 Unspecified hearing loss, unspecified ear: Secondary | ICD-10-CM | POA: Diagnosis not present

## 2015-01-29 DIAGNOSIS — H6120 Impacted cerumen, unspecified ear: Secondary | ICD-10-CM | POA: Diagnosis not present

## 2015-02-19 ENCOUNTER — Telehealth: Payer: Self-pay | Admitting: Cardiovascular Disease

## 2015-02-19 MED ORDER — METOPROLOL SUCCINATE ER 50 MG PO TB24: ORAL_TABLET | ORAL | Status: DC

## 2015-02-19 NOTE — Telephone Encounter (Signed)
Rx(s) sent to pharmacy electronically. Patient notified & reminded of OV 04/22/2015

## 2015-02-19 NOTE — Telephone Encounter (Signed)
°  1. Which medications need to be refilled? Metoprolol  2. Which pharmacy is medication to be sent to?[facility] on [address]  3. Do they need a 30 day or 90 day supply? Not specified   4. Would they like a call back once the medication has been sent to the pharmacy? yes

## 2015-02-27 DIAGNOSIS — N3020 Other chronic cystitis without hematuria: Secondary | ICD-10-CM | POA: Diagnosis not present

## 2015-02-27 DIAGNOSIS — D1771 Benign lipomatous neoplasm of kidney: Secondary | ICD-10-CM | POA: Diagnosis not present

## 2015-02-27 DIAGNOSIS — R350 Frequency of micturition: Secondary | ICD-10-CM | POA: Diagnosis not present

## 2015-03-11 DIAGNOSIS — M1711 Unilateral primary osteoarthritis, right knee: Secondary | ICD-10-CM | POA: Diagnosis not present

## 2015-03-11 DIAGNOSIS — M1712 Unilateral primary osteoarthritis, left knee: Secondary | ICD-10-CM | POA: Diagnosis not present

## 2015-03-18 ENCOUNTER — Encounter: Payer: Self-pay | Admitting: *Deleted

## 2015-03-21 DIAGNOSIS — D1771 Benign lipomatous neoplasm of kidney: Secondary | ICD-10-CM | POA: Diagnosis not present

## 2015-03-26 ENCOUNTER — Encounter: Payer: Self-pay | Admitting: Gastroenterology

## 2015-04-22 ENCOUNTER — Encounter: Payer: Self-pay | Admitting: Cardiovascular Disease

## 2015-04-22 ENCOUNTER — Ambulatory Visit (INDEPENDENT_AMBULATORY_CARE_PROVIDER_SITE_OTHER): Payer: Medicare Other | Admitting: Cardiovascular Disease

## 2015-04-22 VITALS — BP 150/62 | HR 72 | Ht 63.0 in | Wt 184.7 lb

## 2015-04-22 DIAGNOSIS — K219 Gastro-esophageal reflux disease without esophagitis: Secondary | ICD-10-CM | POA: Diagnosis not present

## 2015-04-22 DIAGNOSIS — Z8249 Family history of ischemic heart disease and other diseases of the circulatory system: Secondary | ICD-10-CM | POA: Diagnosis not present

## 2015-04-22 DIAGNOSIS — I10 Essential (primary) hypertension: Secondary | ICD-10-CM | POA: Diagnosis not present

## 2015-04-22 DIAGNOSIS — Z87898 Personal history of other specified conditions: Secondary | ICD-10-CM | POA: Insufficient documentation

## 2015-04-22 DIAGNOSIS — E669 Obesity, unspecified: Secondary | ICD-10-CM

## 2015-04-22 DIAGNOSIS — M1711 Unilateral primary osteoarthritis, right knee: Secondary | ICD-10-CM

## 2015-04-22 DIAGNOSIS — Z8679 Personal history of other diseases of the circulatory system: Secondary | ICD-10-CM

## 2015-04-22 DIAGNOSIS — E785 Hyperlipidemia, unspecified: Secondary | ICD-10-CM | POA: Diagnosis not present

## 2015-04-22 NOTE — Patient Instructions (Signed)
Your physician wants you to follow-up in: 1 year or sooner if needed. You will receive a reminder letter in the mail two months in advance. If you don't receive a letter, please call our office to schedule the follow-up appointment.  

## 2015-04-22 NOTE — Progress Notes (Signed)
Patient ID: Jade Hernandez, female   DOB: [birth year], 74 y.o.   MRN: [id number]     HPI: Jade Hernandez is a 75 y.o. female who presents to the office for one year cardiology evaluation.  Jade Hernandez has a strong family history for premature coronary disease and has a history of hypertension, palpitations, and hyperlipidemia. In December 2011 an ECHO Doppler study showed proximal septal thickening without outflow tract obstruction, normal LV systolic function but grade 1 diastolic dysfunction. There was mild LA dilatation, mild MAC with mild MR, TR and mild aortic valve sclerosis with mild AR. A nuclear perfusion study in 2008 revealed normal perfusion. Recently, she has noticed that her blood pressure has been tending to stay in the range of 140. She denies any chest tightness. She denies dyspnea. He denies palpitations. She presents for evaluation.  In August 2015, she underwent laboratory echo.  This revealed normal renal function with a BUN of 14, creatine 0.43.  Fasting glucose was 85.  LFTs were normal.  Total cholesterol was 165, HDL 52, triglycerides 166, and LDL cholesterol 80.  Over the past year, she has done well on a medical regimen with simvastatin 40 mg for hyperlipidemia. She takes Toprol-XL 75 mg and HCTZ 25 mg for palpitations and hypertension. She is on Prilosec for GERD.  She also is bothered by right knee pain for which she takes meloxicam 15 mg on an as-needed basis.  She denies chest pain.  She denies PND, orthopnea.  She denies recent palpitations.  There is no significant leg swelling. She presents for one-year evaluation.  Past Medical History  Diagnosis Date  . Hypertension   . High cholesterol   . Arthritis   . UTI (urinary tract infection)   . Diverticulosis   . Palpitations     Past Surgical History  Procedure Laterality Date  . Colonoscopy    . Nm myocar perf wall motion  10/02/2007    No significant ischemia demonstrated  . US echocardiography   10/29/2010    Proximal septal thickening noted, EF =>55%,LA mildly dilated,mild mitral annular ca+, AOV mildly sclerotic, mild AI.    No Known Allergies  Current Outpatient Prescriptions  Medication Sig Dispense Refill  . aspirin EC 81 MG tablet Take 81 mg by mouth daily.    . Cholecalciferol (VITAMIN D) 2000 UNITS CAPS Take 4,000 Units by mouth daily.    . hydrochlorothiazide (HYDRODIURIL) 25 MG tablet Take 25 mg by mouth daily.    Marland Kitchen ibandronate (BONIVA) 150 MG tablet Take 150 mg by mouth every 30 (thirty) days. Take in the morning with a full glass of water, on an empty stomach, and do not take anything else by mouth or lie down for the next 30 min.- the 1st week of every month    . meloxicam (MOBIC) 15 MG tablet Take 15 mg by mouth daily as needed. For knee pain    . metoprolol succinate (TOPROL-XL) 50 MG 24 hr tablet Take 1.5 tablets (70m) by mouth daily 45 tablet 1  . omeprazole (PRILOSEC) 20 MG capsule Take 20 mg by mouth every other day.    . simvastatin (ZOCOR) 40 MG tablet Take 40 mg by mouth every evening.    . trimethoprim (TRIMPEX) 100 MG tablet Take 1-2 tablets by mouth as needed.  1   No current facility-administered medications for this visit.    History   Social History  . Marital Status: Married    Spouse Name: N/A  . Number  of Children: N/A  . Years of Education: N/A   Occupational History  . Not on file.   Social History Main Topics  . Smoking status: Never Smoker   . Smokeless tobacco: Never Used  . Alcohol Use: No  . Drug Use: No  . Sexual Activity: Not on file   Other Topics Concern  . Not on file   Social History Narrative   Cup of coffee a day   She is married and has 4 children 9 grandchildren. Her brother is my patient Mr. Reather Converse.  She states she cannot walk due to knee discomfort and often times tries to march in place for exercise.  Family History  Problem Relation Age of Onset  . Heart disease Father     died from heart attack  .  Hypertension Mother   . Diabetes Mother   . Heart disease Brother    ROS General: Negative; No fevers, chills, or night sweats; no success with weight loss HEENT: Negative; No changes in vision or hearing, sinus congestion, difficulty swallowing Pulmonary: Negative; No cough, wheezing, shortness of breath, hemoptysis Cardiovascular: Negative; No chest pain, presyncope, syncope, palpitations GI: Negative; No nausea, vomiting, diarrhea, or abdominal pain GU: Negative; No dysuria, hematuria, or difficulty voiding Musculoskeletal: Positive for right knee discomfort Hematologic/Oncology: Negative; no easy bruising, bleeding Endocrine: Negative; no heat/cold intolerance; no diabetes Neuro: Negative; no changes in balance, headaches Skin: Negative; No rashes or skin lesions Psychiatric: Negative; No behavioral problems, depression Sleep: Negative; No snoring, daytime sleepiness, hypersomnolence, bruxism, restless legs, hypnogognic hallucinations, no cataplexy Other comprehensive 14 point system review is negative.  PE BP 150/62 mmHg  Pulse 72  Ht 5' 3" (1.6 m)  Wt 184 lb 11.2 oz (83.779 kg)  BMI 32.73 kg/m2  Repeat blood pressure by me 130/80. Wt Readings from Last 3 Encounters:  04/22/15 184 lb 11.2 oz (83.779 kg)  02/07/14 185 lb 8 oz (84.142 kg)  01/11/14 185 lb (83.915 kg)   General: Alert, oriented, no distress.  Skin: normal turgor, no rashes HEENT: Normocephalic, atraumatic. Pupils round and reactive; sclera anicteric;no lid lag. Extraocular muscles intact;; no xanthelasmas. Nose without nasal septal hypertrophy Mouth/Parynx benign; Mallinpatti scale 2 Neck: No JVD, no carotid bruits; normal carotid upstroke Lungs: clear to ausculatation and percussion; no wheezing or rales Chest wall: no tenderness to palpitation Heart: RRR, s1 s2 normal; 1/6 systolic murmur;no diastolic murmur, rub thrills or heaves Abdomen: Mild central adiposity soft, nontender; no hepatosplenomehaly,  BS+; abdominal aorta nontender and not dilated by palpation. Back: no CVA tenderness Pulses 2+ Extremities: no clubbing cyanosis or edema, Homan's sign negative  Neurologic: grossly nonfocal; cranial nerves grossly normal. Psychologic: normal affect and mood.  ECG (independently read by me): Normal sinus rhythm at 72 bpm.  Early transition.  No ectopy.  QTC 462 ms.  February 2015 ECG (independently read by me): Normal sinus rhythm at 69 beats per minute. Mild RV conduction delay. No ectopy.  LABS: BMP Latest Ref Rng 06/18/2012 06/17/2012  Glucose 70 - 99 mg/dL 107(H) 119(H)  BUN 6 - 23 mg/dL 11 15  Creatinine 0.50 - 1.10 mg/dL 0.42(L) 0.52  Sodium 135 - 145 mEq/L 141 142  Potassium 3.5 - 5.1 mEq/L 3.6 3.9  Chloride 96 - 112 mEq/L 107 106  CO2 19 - 32 mEq/L 27 28  Calcium 8.4 - 10.5 mg/dL 8.8 9.2    Hepatic Function Latest Ref Rng 06/17/2012  Total Protein 6.0 - 8.3 g/dL 6.7  Albumin 3.5 - 5.2  g/dL 3.5  AST 0 - 37 U/L 16  ALT 0 - 35 U/L 12  Alk Phosphatase 39 - 117 U/L 48  Total Bilirubin 0.3 - 1.2 mg/dL 0.5   CBC Latest Ref Rng 06/27/2012 06/18/2012 06/17/2012  WBC 4.5 - 10.5 K/uL 5.0 4.1 7.8  Hemoglobin 12.0 - 15.0 g/dL 11.2(L) 11.0(L) 12.7  Hematocrit 36.0 - 46.0 % 34.6(L) 32.8(L) 38.8  Platelets 150.0 - 400.0 K/uL 295.0 173 240   No results found for: TSH  Lipid Panel  No results found for: CHOL, TRIG, HDL, CHOLHDL, VLDL, LDLCALC, LDLDIRECT  RADIOLOGY: No results found.    ASSESSMENT AND PLAN:  Ms Mclouth is a very pleasant 74 year old female who has a history of hypertension, palpitations, hyperlipidemia.  Her blood pressure today when rechecked by me was well controlled on her current dose of Toprol-XL 75 mg and HCTZ 25 mg.  She does not have any edema.  Her palpitations have resolved with the beta blocker regimen.  Her GERD is controlled with Prilosec.  She is tolerant simvastatin without side effects.  I reviewed her recent laboratory done by her primary physician.  Total  cholesterol was 165 LDL 80.  Triglycerides were mildly increased at 166.  We discussed importance of diet and reduction carbohydrates and sweets.  I also discussed the importance of increasing exercise.  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  I discussed exercising 5 days a week for up to 30 minutes of moderate intensity at all possible.  Her BMI is elevated at 32.7 and is compatible with mild obesity.  She will be undergoing one-year follow-up laboratory with her primary physician this summer and I will ask that these be sent to my office for my review.  I will see her in one year for cardiology reevaluation.    Time spent 25 mnutes  Troy Sine, MD, [facility]  04/22/2015 3:53 PM

## 2015-04-25 DIAGNOSIS — Z1231 Encounter for screening mammogram for malignant neoplasm of breast: Secondary | ICD-10-CM | POA: Diagnosis not present

## 2015-07-30 DIAGNOSIS — M1990 Unspecified osteoarthritis, unspecified site: Secondary | ICD-10-CM | POA: Diagnosis not present

## 2015-07-30 DIAGNOSIS — K5730 Diverticulosis of large intestine without perforation or abscess without bleeding: Secondary | ICD-10-CM | POA: Diagnosis not present

## 2015-07-30 DIAGNOSIS — Z1211 Encounter for screening for malignant neoplasm of colon: Secondary | ICD-10-CM | POA: Diagnosis not present

## 2015-07-30 DIAGNOSIS — M899 Disorder of bone, unspecified: Secondary | ICD-10-CM | POA: Diagnosis not present

## 2015-07-30 DIAGNOSIS — I10 Essential (primary) hypertension: Secondary | ICD-10-CM | POA: Diagnosis not present

## 2015-07-30 DIAGNOSIS — Z0000 Encounter for general adult medical examination without abnormal findings: Secondary | ICD-10-CM | POA: Diagnosis not present

## 2015-07-30 DIAGNOSIS — K219 Gastro-esophageal reflux disease without esophagitis: Secondary | ICD-10-CM | POA: Diagnosis not present

## 2015-07-30 DIAGNOSIS — E782 Mixed hyperlipidemia: Secondary | ICD-10-CM | POA: Diagnosis not present

## 2015-07-30 DIAGNOSIS — E669 Obesity, unspecified: Secondary | ICD-10-CM | POA: Diagnosis not present

## 2015-07-30 DIAGNOSIS — Z6832 Body mass index (BMI) 32.0-32.9, adult: Secondary | ICD-10-CM | POA: Diagnosis not present

## 2015-08-13 DIAGNOSIS — Z1211 Encounter for screening for malignant neoplasm of colon: Secondary | ICD-10-CM | POA: Diagnosis not present

## 2015-08-20 DIAGNOSIS — M170 Bilateral primary osteoarthritis of knee: Secondary | ICD-10-CM | POA: Diagnosis not present

## 2015-08-20 DIAGNOSIS — M1712 Unilateral primary osteoarthritis, left knee: Secondary | ICD-10-CM | POA: Diagnosis not present

## 2015-08-20 DIAGNOSIS — M1711 Unilateral primary osteoarthritis, right knee: Secondary | ICD-10-CM | POA: Diagnosis not present

## 2015-09-09 DIAGNOSIS — Z23 Encounter for immunization: Secondary | ICD-10-CM | POA: Diagnosis not present

## 2015-09-10 DIAGNOSIS — Z01419 Encounter for gynecological examination (general) (routine) without abnormal findings: Secondary | ICD-10-CM | POA: Diagnosis not present

## 2015-09-10 DIAGNOSIS — Z6833 Body mass index (BMI) 33.0-33.9, adult: Secondary | ICD-10-CM | POA: Diagnosis not present

## 2015-10-27 DIAGNOSIS — D3101 Benign neoplasm of right conjunctiva: Secondary | ICD-10-CM | POA: Diagnosis not present

## 2015-10-27 DIAGNOSIS — H52203 Unspecified astigmatism, bilateral: Secondary | ICD-10-CM | POA: Diagnosis not present

## 2015-10-27 DIAGNOSIS — H25013 Cortical age-related cataract, bilateral: Secondary | ICD-10-CM | POA: Diagnosis not present

## 2015-11-03 ENCOUNTER — Other Ambulatory Visit: Payer: Self-pay | Admitting: Cardiovascular Disease

## 2015-12-01 DIAGNOSIS — S0003XA Contusion of scalp, initial encounter: Secondary | ICD-10-CM | POA: Diagnosis not present

## 2015-12-01 DIAGNOSIS — S8002XA Contusion of left knee, initial encounter: Secondary | ICD-10-CM | POA: Diagnosis not present

## 2015-12-01 DIAGNOSIS — W19XXXA Unspecified fall, initial encounter: Secondary | ICD-10-CM | POA: Diagnosis not present

## 2015-12-01 DIAGNOSIS — M542 Cervicalgia: Secondary | ICD-10-CM | POA: Diagnosis not present

## 2015-12-01 DIAGNOSIS — S0093XA Contusion of unspecified part of head, initial encounter: Secondary | ICD-10-CM | POA: Diagnosis not present

## 2015-12-04 ENCOUNTER — Ambulatory Visit
Admission: RE | Admit: 2015-12-04 | Discharge: 2015-12-04 | Disposition: A | Discharge status: Home / Self Care | Payer: BLUE CROSS/BLUE SHIELD | Source: Ambulatory Visit | Attending: Family Medicine | Admitting: Family Medicine

## 2015-12-04 ENCOUNTER — Other Ambulatory Visit: Payer: Self-pay | Admitting: Family Medicine

## 2015-12-04 DIAGNOSIS — S0512XA Contusion of eyeball and orbital tissues, left eye, initial encounter: Secondary | ICD-10-CM | POA: Diagnosis not present

## 2015-12-04 DIAGNOSIS — W010XXA Fall on same level from slipping, tripping and stumbling without subsequent striking against object, initial encounter: Secondary | ICD-10-CM | POA: Diagnosis not present

## 2015-12-04 DIAGNOSIS — W19XXXA Unspecified fall, initial encounter: Secondary | ICD-10-CM

## 2015-12-04 DIAGNOSIS — M545 Low back pain: Secondary | ICD-10-CM | POA: Diagnosis not present

## 2015-12-04 DIAGNOSIS — R51 Headache: Secondary | ICD-10-CM | POA: Diagnosis not present

## 2015-12-04 DIAGNOSIS — S0093XA Contusion of unspecified part of head, initial encounter: Secondary | ICD-10-CM | POA: Diagnosis not present

## 2015-12-11 DIAGNOSIS — S8392XA Sprain of unspecified site of left knee, initial encounter: Secondary | ICD-10-CM | POA: Diagnosis not present

## 2015-12-11 DIAGNOSIS — M1712 Unilateral primary osteoarthritis, left knee: Secondary | ICD-10-CM | POA: Diagnosis not present

## 2016-04-26 DIAGNOSIS — Z1231 Encounter for screening mammogram for malignant neoplasm of breast: Secondary | ICD-10-CM | POA: Diagnosis not present

## 2016-05-11 DIAGNOSIS — M1712 Unilateral primary osteoarthritis, left knee: Secondary | ICD-10-CM | POA: Diagnosis not present

## 2016-05-11 DIAGNOSIS — M1711 Unilateral primary osteoarthritis, right knee: Secondary | ICD-10-CM | POA: Diagnosis not present

## 2016-05-11 DIAGNOSIS — M170 Bilateral primary osteoarthritis of knee: Secondary | ICD-10-CM | POA: Diagnosis not present

## 2016-06-07 DIAGNOSIS — R350 Frequency of micturition: Secondary | ICD-10-CM | POA: Diagnosis not present

## 2016-06-07 DIAGNOSIS — D1771 Benign lipomatous neoplasm of kidney: Secondary | ICD-10-CM | POA: Diagnosis not present

## 2016-06-07 DIAGNOSIS — N281 Cyst of kidney, acquired: Secondary | ICD-10-CM | POA: Diagnosis not present

## 2016-06-15 DIAGNOSIS — M791 Myalgia: Secondary | ICD-10-CM | POA: Diagnosis not present

## 2016-06-15 DIAGNOSIS — R509 Fever, unspecified: Secondary | ICD-10-CM | POA: Diagnosis not present

## 2016-06-23 DIAGNOSIS — M21619 Bunion of unspecified foot: Secondary | ICD-10-CM | POA: Diagnosis not present

## 2016-06-23 DIAGNOSIS — M19071 Primary osteoarthritis, right ankle and foot: Secondary | ICD-10-CM | POA: Diagnosis not present

## 2016-06-24 DIAGNOSIS — R300 Dysuria: Secondary | ICD-10-CM | POA: Diagnosis not present

## 2016-06-24 DIAGNOSIS — R8271 Bacteriuria: Secondary | ICD-10-CM | POA: Diagnosis not present

## 2016-07-16 ENCOUNTER — Ambulatory Visit (INDEPENDENT_AMBULATORY_CARE_PROVIDER_SITE_OTHER): Payer: Medicare Other | Admitting: Cardiovascular Disease

## 2016-07-16 ENCOUNTER — Encounter: Payer: Self-pay | Admitting: Cardiovascular Disease

## 2016-07-16 VITALS — BP 120/60 | HR 73 | Ht 63.5 in | Wt 176.4 lb

## 2016-07-16 DIAGNOSIS — E785 Hyperlipidemia, unspecified: Secondary | ICD-10-CM | POA: Diagnosis not present

## 2016-07-16 DIAGNOSIS — I10 Essential (primary) hypertension: Secondary | ICD-10-CM

## 2016-07-16 DIAGNOSIS — M1711 Unilateral primary osteoarthritis, right knee: Secondary | ICD-10-CM

## 2016-07-16 DIAGNOSIS — K219 Gastro-esophageal reflux disease without esophagitis: Secondary | ICD-10-CM

## 2016-07-16 DIAGNOSIS — Z8679 Personal history of other diseases of the circulatory system: Secondary | ICD-10-CM

## 2016-07-16 DIAGNOSIS — Z87898 Personal history of other specified conditions: Secondary | ICD-10-CM

## 2016-07-16 NOTE — Progress Notes (Signed)
Patient ID: Jade Hernandez, female   DOB: [birth year], 75 y.o.   MRN: [id number]     PCP: Dr. [name]  HPI: [name] is a 75 y.o. female who presents to the office for a 15 month cardiology evaluation.  She is the sister of my patient Jade Hernandez.  Jade Hernandez has a strong family history for premature coronary disease and has a history of hypertension, palpitations, and hyperlipidemia. In December 2011 an ECHO Doppler study showed proximal septal thickening without outflow tract obstruction, normal LV systolic function but grade 1 diastolic dysfunction. There was mild LA dilatation, mild MAC with mild MR, TR and mild aortic valve sclerosis with mild AR. A nuclear perfusion study in 2008 revealed normal perfusion. Recently, she has noticed that her blood pressure has been tending to stay in the range of 140. She denies any chest tightness. She denies dyspnea. He denies palpitations. She presents for evaluation.  In August 2015 a F/U echo revealed normal renal function with a BUN of 14, creatine 0.43.  Fasting glucose was 85.  LFTs were normal.  Total cholesterol was 165, HDL 52, triglycerides 166, and LDL cholesterol 80.  Over the past year, she has done well on a medical regimen with simvastatin 40 mg for hyperlipidemia. She takes Toprol-XL 75 mg and HCTZ 25 mg for palpitations and hypertension. She is on Prilosec for GERD.  She also is bothered by right knee pain for which she takes meloxicam 15 mg on an as-needed basis.    He tells me that she had a complete set of blood work done by her primary physician several months ago.  She was told that her labs were within the normal range.  She denies any episodes of chest pain or shortness of breath.  She had recently completed antibiotic course for UTI with cephalexin.  She has noticed very rare isolated palpitation and was wondering if the antibiotic may be contributing to this.  She denies significant swelling.  She presents for   evaluation.  Past Medical History:  Diagnosis Date  . Arthritis   . Diverticulosis   . High cholesterol   . Hypertension   . Palpitations   . UTI (urinary tract infection)     Past Surgical History:  Procedure Laterality Date  . COLONOSCOPY    . NM MYOCAR PERF WALL MOTION  10/02/2007   No significant ischemia demonstrated  . US ECHOCARDIOGRAPHY  10/29/2010   Proximal septal thickening noted, EF =>55%,LA mildly dilated,mild mitral annular ca+, AOV mildly sclerotic, mild AI.    No Known Allergies  Current Outpatient Prescriptions  Medication Sig Dispense Refill  . aspirin EC 81 MG tablet Take 81 mg by mouth daily.    . Cholecalciferol (VITAMIN D) 2000 UNITS CAPS Take 4,000 Units by mouth daily.    . hydrochlorothiazide (HYDRODIURIL) 25 MG tablet Take 25 mg by mouth daily.    Marland Kitchen ibandronate (BONIVA) 150 MG tablet Take 150 mg by mouth every 30 (thirty) days. Take in the morning with a full glass of water, on an empty stomach, and do not take anything else by mouth or lie down for the next 30 min.- the 1st week of every month    . meloxicam (MOBIC) 15 MG tablet Take 15 mg by mouth daily as needed. For knee pain    . metoprolol succinate (TOPROL-XL) 50 MG 24 hr tablet Take 75 mg by mouth daily. Take with or immediately following a meal.    .  omeprazole (PRILOSEC) 20 MG capsule Take 20 mg by mouth every other day.    . simvastatin (ZOCOR) 40 MG tablet Take 40 mg by mouth every evening.    . trimethoprim (TRIMPEX) 100 MG tablet Take 1-2 tablets by mouth as needed.  1   No current facility-administered medications for this visit.     Social History   Social History  . Marital status: Married    Spouse name: N/A  . Number of children: N/A  . Years of education: N/A   Occupational History  . Not on file.   Social History Main Topics  . Smoking status: Never Smoker  . Smokeless tobacco: Never Used  . Alcohol use No  . Drug use: No  . Sexual activity: Not on file   Other  Topics Concern  . Not on file   Social History Narrative   Cup of coffee a day   She is married and has 4 children 9 grandchildren. Her brother is my patient Mr. Jade Hernandez.  She states she cannot walk due to knee discomfort and often times tries to march in place for exercise.  Family History  Problem Relation Age of Onset  . Heart disease Father     died from heart attack  . Hypertension Mother   . Diabetes Mother   . Heart disease Brother    ROS General: Negative; No fevers, chills, or night sweats; no success with weight loss HEENT: Negative; No changes in vision or hearing, sinus congestion, difficulty swallowing Pulmonary: Negative; No cough, wheezing, shortness of breath, hemoptysis Cardiovascular: Negative; No chest pain, presyncope, syncope, palpitations GI: Negative; No nausea, vomiting, diarrhea, or abdominal pain GU: Negative; No dysuria, hematuria, or difficulty voiding Musculoskeletal: Positive for right knee discomfort Hematologic/Oncology: Negative; no easy bruising, bleeding Endocrine: Negative; no heat/cold intolerance; no diabetes Neuro: Negative; no changes in balance, headaches Skin: Negative; No rashes or skin lesions Psychiatric: Negative; No behavioral problems, depression Sleep: Negative; No snoring, daytime sleepiness, hypersomnolence, bruxism, restless legs, hypnogognic hallucinations, no cataplexy Other comprehensive 14 point system review is negative.  PE BP 120/60 (BP Location: Left Arm, Patient Position: Sitting, Cuff Size: Normal)   Pulse 73   Ht 5' 3.5" (1.613 m)   Wt 176 lb 6 oz (80 kg)   BMI 30.75 kg/m    Repeat blood pressure by me 128/64.  Wt Readings from Last 3 Encounters:  07/16/16 176 lb 6 oz (80 kg)  04/22/15 184 lb 11.2 oz (83.8 kg)  02/07/14 185 lb 8 oz (84.1 kg)   General: Alert, oriented, no distress.  Skin: normal turgor, no rashes HEENT: Normocephalic, atraumatic. Pupils round and reactive; sclera anicteric;no lid lag.  Extraocular muscles intact;; no xanthelasmas. Nose without nasal septal hypertrophy Mouth/Parynx benign; Mallinpatti scale 2 Neck: No JVD, no carotid bruits; normal carotid upstroke Lungs: clear to ausculatation and percussion; no wheezing or rales Chest wall: no tenderness to palpitation Heart: RRR, s1 s2 normal; 1/6 systolic murmur;no diastolic murmur, rub thrills or heaves Abdomen: Mild central adiposity soft, nontender; no hepatosplenomehaly, BS+; abdominal aorta nontender and not dilated by palpation. Back: no CVA tenderness Pulses 2+ Extremities: no clubbing cyanosis or edema, Homan's sign negative  Neurologic: grossly nonfocal; cranial nerves grossly normal. Psychologic: normal affect and mood.  ECG (independently read by me): Normal sinus rhythm at 73 bpm.  No ectopy.  Normal intervals.  June 2016 ECG (independently read by me): Normal sinus rhythm at 72 bpm.  Early transition.  No ectopy.  QTC 462  ms.  February 2015 ECG (independently read by me): Normal sinus rhythm at 69 beats per minute. Mild RV conduction delay. No ectopy.  LABS: BMP Latest Ref Rng & Units 06/18/2012 06/17/2012  Glucose 70 - 99 mg/dL 107(H) 119(H)  BUN 6 - 23 mg/dL 11 15  Creatinine 0.50 - 1.10 mg/dL 0.42(L) 0.52  Sodium 135 - 145 mEq/L 141 142  Potassium 3.5 - 5.1 mEq/L 3.6 3.9  Chloride 96 - 112 mEq/L 107 106  CO2 19 - 32 mEq/L 27 28  Calcium 8.4 - 10.5 mg/dL 8.8 9.2    Hepatic Function Latest Ref Rng & Units 06/17/2012  Total Protein 6.0 - 8.3 g/dL 6.7  Albumin 3.5 - 5.2 g/dL 3.5  AST 0 - 37 U/L 16  ALT 0 - 35 U/L 12  Alk Phosphatase 39 - 117 U/L 48  Total Bilirubin 0.3 - 1.2 mg/dL 0.5   CBC Latest Ref Rng & Units 06/27/2012 06/18/2012 06/17/2012  WBC 4.5 - 10.5 K/uL 5.0 4.1 7.8  Hemoglobin 12.0 - 15.0 g/dL 11.2(L) 11.0(L) 12.7  Hematocrit 36.0 - 46.0 % 34.6(L) 32.8(L) 38.8  Platelets 150.0 - 400.0 K/uL 295.0 173 240   No results found for: TSH  Lipid Panel  No results found for: CHOL, TRIG, HDL,  CHOLHDL, VLDL, LDLCALC, LDLDIRECT  RADIOLOGY: No results found.    ASSESSMENT AND PLAN: Ms Duddy is a very pleasant 75 year old female who has a history of hypertension, palpitations, hyperlipidemia.  Her blood pressure today is well controlled on her current dose of Toprol-XL 75 mg and HCTZ 25 mg.  She does not have any edema.  She has lost weight since her last evaluation in her weight today is 176.  BMI is improved but still places her in the mildly obese category 30.75.  We discussed potential further weight reduction.  She has experienced rare episode of palpable of palpitation which typically occur when she sits down.  She works at her [employer].  She oftentimes is on the computer.  She denies any exertional precipitation of chest pain or palpitations.  She can take an extra one half of metoprolol if she notices worsening of this palpitation.  I will try to obtain the results of her recent blood work which was done by her primary physician.  Her GERD is controlled with omeprazole.  She continues to have knee discomfort and takes meloxicam intermittently.  She continues to be on simvastatin 40 mg for hyperlipidemia.  She continues to take supplemental vitamin D and Boniva.  I have recommended increased activity with some type of exercise at least 5 days per week if possible.  I will see her in one year for reevaluation.   Time spent 25 mnutes  Troy Sine, MD, [facility]  07/16/2016 10:11 AM

## 2016-07-16 NOTE — Patient Instructions (Signed)
Your physician wants you to follow-up in: 1 year or sooner if needed. You will receive a reminder letter in the mail two months in advance. If you don't receive a letter, please call our office to schedule the follow-up appointment.   If you need a refill on your cardiac medications before your next appointment, please call your pharmacy.   

## 2016-08-13 ENCOUNTER — Other Ambulatory Visit: Payer: Self-pay | Admitting: Cardiovascular Disease

## 2016-08-24 DIAGNOSIS — N3281 Overactive bladder: Secondary | ICD-10-CM | POA: Diagnosis not present

## 2016-08-24 DIAGNOSIS — N3020 Other chronic cystitis without hematuria: Secondary | ICD-10-CM | POA: Diagnosis not present

## 2016-09-08 DIAGNOSIS — Z23 Encounter for immunization: Secondary | ICD-10-CM | POA: Diagnosis not present

## 2016-09-16 ENCOUNTER — Other Ambulatory Visit: Payer: Self-pay | Admitting: Cardiovascular Disease

## 2016-09-16 NOTE — Telephone Encounter (Signed)
Rx(s) sent to pharmacy electronically.  

## 2016-09-28 DIAGNOSIS — M71571 Other bursitis, not elsewhere classified, right ankle and foot: Secondary | ICD-10-CM | POA: Diagnosis not present

## 2016-09-28 DIAGNOSIS — M76811 Anterior tibial syndrome, right leg: Secondary | ICD-10-CM | POA: Diagnosis not present

## 2016-09-28 DIAGNOSIS — M19071 Primary osteoarthritis, right ankle and foot: Secondary | ICD-10-CM | POA: Diagnosis not present

## 2016-10-05 DIAGNOSIS — M71571 Other bursitis, not elsewhere classified, right ankle and foot: Secondary | ICD-10-CM | POA: Diagnosis not present

## 2016-10-05 DIAGNOSIS — M76811 Anterior tibial syndrome, right leg: Secondary | ICD-10-CM | POA: Diagnosis not present

## 2016-10-12 DIAGNOSIS — Z6832 Body mass index (BMI) 32.0-32.9, adult: Secondary | ICD-10-CM | POA: Diagnosis not present

## 2016-10-12 DIAGNOSIS — Z124 Encounter for screening for malignant neoplasm of cervix: Secondary | ICD-10-CM | POA: Diagnosis not present

## 2016-10-12 DIAGNOSIS — R350 Frequency of micturition: Secondary | ICD-10-CM | POA: Diagnosis not present

## 2016-10-14 DIAGNOSIS — M170 Bilateral primary osteoarthritis of knee: Secondary | ICD-10-CM | POA: Diagnosis not present

## 2016-10-15 DIAGNOSIS — M71571 Other bursitis, not elsewhere classified, right ankle and foot: Secondary | ICD-10-CM | POA: Diagnosis not present

## 2016-10-15 DIAGNOSIS — M76811 Anterior tibial syndrome, right leg: Secondary | ICD-10-CM | POA: Diagnosis not present

## 2016-10-20 DIAGNOSIS — Z0000 Encounter for general adult medical examination without abnormal findings: Secondary | ICD-10-CM | POA: Diagnosis not present

## 2016-10-20 DIAGNOSIS — D72819 Decreased white blood cell count, unspecified: Secondary | ICD-10-CM | POA: Diagnosis not present

## 2016-10-20 DIAGNOSIS — N390 Urinary tract infection, site not specified: Secondary | ICD-10-CM | POA: Diagnosis not present

## 2016-10-20 DIAGNOSIS — I10 Essential (primary) hypertension: Secondary | ICD-10-CM | POA: Diagnosis not present

## 2016-10-20 DIAGNOSIS — K5730 Diverticulosis of large intestine without perforation or abscess without bleeding: Secondary | ICD-10-CM | POA: Diagnosis not present

## 2016-10-20 DIAGNOSIS — Z1211 Encounter for screening for malignant neoplasm of colon: Secondary | ICD-10-CM | POA: Diagnosis not present

## 2016-10-20 DIAGNOSIS — E669 Obesity, unspecified: Secondary | ICD-10-CM | POA: Diagnosis not present

## 2016-10-20 DIAGNOSIS — M1990 Unspecified osteoarthritis, unspecified site: Secondary | ICD-10-CM | POA: Diagnosis not present

## 2016-10-20 DIAGNOSIS — K219 Gastro-esophageal reflux disease without esophagitis: Secondary | ICD-10-CM | POA: Diagnosis not present

## 2016-10-20 DIAGNOSIS — M899 Disorder of bone, unspecified: Secondary | ICD-10-CM | POA: Diagnosis not present

## 2016-10-20 DIAGNOSIS — E782 Mixed hyperlipidemia: Secondary | ICD-10-CM | POA: Diagnosis not present

## 2016-10-22 DIAGNOSIS — M170 Bilateral primary osteoarthritis of knee: Secondary | ICD-10-CM | POA: Diagnosis not present

## 2016-10-29 DIAGNOSIS — M170 Bilateral primary osteoarthritis of knee: Secondary | ICD-10-CM | POA: Diagnosis not present

## 2016-11-01 DIAGNOSIS — H52203 Unspecified astigmatism, bilateral: Secondary | ICD-10-CM | POA: Diagnosis not present

## 2016-11-01 DIAGNOSIS — H25013 Cortical age-related cataract, bilateral: Secondary | ICD-10-CM | POA: Diagnosis not present

## 2016-12-09 DIAGNOSIS — M170 Bilateral primary osteoarthritis of knee: Secondary | ICD-10-CM | POA: Diagnosis not present

## 2016-12-09 DIAGNOSIS — M1712 Unilateral primary osteoarthritis, left knee: Secondary | ICD-10-CM | POA: Diagnosis not present

## 2016-12-09 DIAGNOSIS — M1711 Unilateral primary osteoarthritis, right knee: Secondary | ICD-10-CM | POA: Diagnosis not present

## 2016-12-13 DIAGNOSIS — Z780 Asymptomatic menopausal state: Secondary | ICD-10-CM | POA: Diagnosis not present

## 2016-12-13 DIAGNOSIS — M8588 Other specified disorders of bone density and structure, other site: Secondary | ICD-10-CM | POA: Diagnosis not present

## 2016-12-14 ENCOUNTER — Telehealth: Payer: Self-pay | Admitting: *Deleted

## 2016-12-14 NOTE — Telephone Encounter (Signed)
Faxed surgical clearance to [facility] for ok to have planned knee surgery to be done by Dr Maureen Ralphs.

## 2016-12-21 DIAGNOSIS — B349 Viral infection, unspecified: Secondary | ICD-10-CM | POA: Diagnosis not present

## 2017-01-11 DIAGNOSIS — M1712 Unilateral primary osteoarthritis, left knee: Secondary | ICD-10-CM | POA: Diagnosis not present

## 2017-01-25 ENCOUNTER — Encounter (HOSPITAL_COMMUNITY): Payer: Self-pay | Admitting: *Deleted

## 2017-02-07 DIAGNOSIS — R040 Epistaxis: Secondary | ICD-10-CM | POA: Diagnosis not present

## 2017-02-07 DIAGNOSIS — H6120 Impacted cerumen, unspecified ear: Secondary | ICD-10-CM | POA: Diagnosis not present

## 2017-02-10 ENCOUNTER — Ambulatory Visit: Payer: Self-pay | Admitting: Orthopedic Surgery

## 2017-02-10 NOTE — Patient Instructions (Addendum)
Jade Hernandez  02/10/2017   Your procedure is scheduled on:02/21/2017    Report to [facility] Main  Entrance and check in at [phone] Admitting  Call this number if you have problems the morning of surgery [phone]   Remember: ONLY 1 PERSON MAY GO WITH YOU TO SHORT STAY TO GET  READY MORNING OF YOUR SURGERY.  Do not eat food or drink liquids :After Midnight.     Take these medicines the morning of surgery with A SIP OF WATER: Metoprolol (Toprol-XL),                              You may not have any metal on your body including hair pins and              piercings  Do not wear jewelry, make-up, lotions, powders or perfumes, deodorant             Do not wear nail polish.  Do not shave  48 hours prior to surgery.                [facility] - Preparing for Surgery Before surgery, you can play an important role.  Because skin is not sterile, your skin needs to be as free of germs as possible.  You can reduce the number of germs on your skin by washing with CHG (chlorahexidine gluconate) soap before surgery.  CHG is an antiseptic cleaner which kills germs and bonds with the skin to continue killing germs even after washing. Please DO NOT use if you have an allergy to CHG or antibacterial soaps.  If your skin becomes reddened/irritated stop using the CHG and inform your nurse when you arrive at Short Stay. Do not shave (including legs and underarms) for at least 48 hours prior to the first CHG shower.  You may shave your face/neck. Please follow these instructions carefully:  1.  Shower with CHG Soap the night before surgery and the  morning of Surgery.  2.  If you choose to wash your hair, wash your hair first as usual with your  normal  shampoo.  3.  After you shampoo, rinse your hair and body thoroughly to remove the  shampoo.                           4.  Use CHG as you would any other liquid soap.  You can apply chg directly  to the skin and wash    Gently with a scrungie or clean washcloth.  5.  Apply the CHG Soap to your body ONLY FROM THE NECK DOWN.   Do not use on face/ open                           Wound or open sores. Avoid contact with eyes, ears mouth and genitals (private parts).                       Wash face,  Genitals (private parts) with your normal soap.             6.  Wash thoroughly, paying special attention to the area where your surgery  will be performed.  7.  Thoroughly rinse your body with warm water from the  neck down.  8.  DO NOT shower/wash with your normal soap after using and rinsing off  the CHG Soap.                9.  Pat yourself dry with a clean towel.            10.  Wear clean pajamas.            11.  Place clean sheets on your bed the night of your first shower and do not  sleep with pets. Day of Surgery : Do not apply any lotions/deodorants the morning of surgery.  Please wear clean clothes to the hospital/surgery center.  FAILURE TO FOLLOW THESE INSTRUCTIONS MAY RESULT IN THE CANCELLATION OF YOUR SURGERY PATIENT SIGNATURE_________________________________  NURSE SIGNATURE__________________________________  ________________________________________________________________________   Adam Phenix  An incentive spirometer is a tool that can help keep your lungs clear and active. This tool measures how well you are filling your lungs with each breath. Taking long deep breaths may help reverse or decrease the chance of developing breathing (pulmonary) problems (especially infection) following:  A long period of time when you are unable to move or be active. BEFORE THE PROCEDURE   If the spirometer includes an indicator to show your best effort, your nurse or respiratory therapist will set it to a desired goal.  If possible, sit up straight or lean slightly forward. Try not to slouch.  Hold the incentive spirometer in an upright position. INSTRUCTIONS FOR USE  1. Sit on the edge of your  bed if possible, or sit up as far as you can in bed or on a chair. 2. Hold the incentive spirometer in an upright position. 3. Breathe out normally. 4. Place the mouthpiece in your mouth and seal your lips tightly around it. 5. Breathe in slowly and as deeply as possible, raising the piston or the ball toward the top of the column. 6. Hold your breath for 3-5 seconds or for as long as possible. Allow the piston or ball to fall to the bottom of the column. 7. Remove the mouthpiece from your mouth and breathe out normally. 8. Rest for a few seconds and repeat Steps 1 through 7 at least 10 times every 1-2 hours when you are awake. Take your time and take a few normal breaths between deep breaths. 9. The spirometer may include an indicator to show your best effort. Use the indicator as a goal to work toward during each repetition. 10. After each set of 10 deep breaths, practice coughing to be sure your lungs are clear. If you have an incision (the cut made at the time of surgery), support your incision when coughing by placing a pillow or rolled up towels firmly against it. Once you are able to get out of bed, walk around indoors and cough well. You may stop using the incentive spirometer when instructed by your caregiver.  RISKS AND COMPLICATIONS  Take your time so you do not get dizzy or light-headed.  If you are in pain, you may need to take or ask for pain medication before doing incentive spirometry. It is harder to take a deep breath if you are having pain. AFTER USE  Rest and breathe slowly and easily.  It can be helpful to keep track of a log of your progress. Your caregiver can provide you with a simple table to help with this. If you are using the spirometer at home, follow these instructions: [place] IF:  You are having difficultly using the spirometer.  You have trouble using the spirometer as often as instructed.  Your pain medication is not giving enough relief while  using the spirometer.  You develop fever of 100.5 F (38.1 C) or higher. SEEK IMMEDIATE MEDICAL CARE IF:   You cough up bloody sputum that had not been present before.  You develop fever of 102 F (38.9 C) or greater.  You develop worsening pain at or near the incision site. MAKE SURE YOU:   Understand these instructions.  Will watch your condition.  Will get help right away if you are not doing well or get worse. Document Released: 03/14/2007 Document Revised: 01/24/2012 Document Reviewed: 05/15/2007 ExitCare Patient Information 2014 ExitCare, [place].   ________________________________________________________________________  WHAT IS A BLOOD TRANSFUSION? Blood Transfusion Information  A transfusion is the replacement of blood or some of its parts. Blood is made up of multiple cells which provide different functions.  Red blood cells carry oxygen and are used for blood loss replacement.  White blood cells fight against infection.  Platelets control bleeding.  Plasma helps clot blood.  Other blood products are available for specialized needs, such as hemophilia or other clotting disorders. BEFORE THE TRANSFUSION  Who gives blood for transfusions?   Healthy volunteers who are fully evaluated to make sure their blood is safe. This is blood bank blood. Transfusion therapy is the safest it has ever been in the practice of medicine. Before blood is taken from a donor, a complete history is taken to make sure that person has no history of diseases nor engages in risky social behavior (examples are intravenous drug use or sexual activity with multiple partners). The donor's travel history is screened to minimize risk of transmitting infections, such as malaria. The donated blood is tested for signs of infectious diseases, such as HIV and hepatitis. The blood is then tested to be sure it is compatible with you in order to minimize the chance of a transfusion reaction. If you or a  relative donates blood, this is often done in anticipation of surgery and is not appropriate for emergency situations. It takes many days to process the donated blood. RISKS AND COMPLICATIONS Although transfusion therapy is very safe and saves many lives, the main dangers of transfusion include:   Getting an infectious disease.  Developing a transfusion reaction. This is an allergic reaction to something in the blood you were given. Every precaution is taken to prevent this. The decision to have a blood transfusion has been considered carefully by your caregiver before blood is given. Blood is not given unless the benefits outweigh the risks. AFTER THE TRANSFUSION  Right after receiving a blood transfusion, you will usually feel much better and more energetic. This is especially true if your red blood cells have gotten low (anemic). The transfusion raises the level of the red blood cells which carry oxygen, and this usually causes an energy increase.  The nurse administering the transfusion will monitor you carefully for complications. HOME CARE INSTRUCTIONS  No special instructions are needed after a transfusion. You may find your energy is better. Speak with your caregiver about any limitations on activity for underlying diseases you may have. SEEK MEDICAL CARE IF:   Your condition is not improving after your transfusion.  You develop redness or irritation at the intravenous (IV) site. SEEK IMMEDIATE MEDICAL CARE IF:  Any of the following symptoms occur over the next 12 hours:  Shaking chills.  You have a temperature  by mouth above 102 F (38.9 C), not controlled by medicine.  Chest, back, or muscle pain.  People around you feel you are not acting correctly or are confused.  Shortness of breath or difficulty breathing.  Dizziness and fainting.  You get a rash or develop hives.  You have a decrease in urine output.  Your urine turns a dark color or changes to pink, red, or  brown. Any of the following symptoms occur over the next 10 days:  You have a temperature by mouth above 102 F (38.9 C), not controlled by medicine.  Shortness of breath.  Weakness after normal activity.  The white part of the eye turns yellow (jaundice).  You have a decrease in the amount of urine or are urinating less often.  Your urine turns a dark color or changes to pink, red, or brown. Document Released: 10/29/2000 Document Revised: 01/24/2012 Document Reviewed: 06/17/2008 ExitCare Patient Information 2014 ExitCare, [place].  _______________________________________________________________________   Do not bring valuables to the hospital. [facility].  Contacts, dentures or bridgework may not be worn into surgery.  Leave suitcase in the car. After surgery it may be brought to your room.         Special Instructions: N/A              Please read over the following fact sheets you were given: _____________________________________________________________________

## 2017-02-11 ENCOUNTER — Ambulatory Visit: Payer: Self-pay | Admitting: Orthopedic Surgery

## 2017-02-11 NOTE — H&P (Signed)
Devona Konig DOB: 1941-01-17 Married / Language: English / Race: White Female Date of Admission:  02/21/2017 CC:  Left knee pain History of Present Illness The patient is a 76 year old female who comes in for a preoperative History and Physical. The patient is scheduled for a left total knee arthroplasty to be performed by Dr. Dione Plover. Aluisio, MD at [facility] on 02-21-2017. The patient is a 76 year old female who has been followed for their bilateral knee pain and osteoarthritis. They are now months out from Euflexxa series . Symptoms reported include: pain, pain at night, aching, stiffness, pain with weightbearing and difficulty arising from chair, while the patient does not report symptoms of: swelling. The patient feels that they are doing poorly and report their pain level to be 7 / 10. The following medication has been used for pain control: Tylenol (prn). The patient has not gotten any relief of their symptoms with viscosupplementation. Both knees have given her a very difficult time. Euflexxa series did not provide much benefit at all. Her left knee is more symptomatic than the right. It is bothering her at all times and limiting what she can and cannot do. She is at a stage now where she feels like she needs a more permanent solution to the knee problem. She is ready to proceed with surgery. They have been treated conservatively in the past for the above stated problem and despite conservative measures, they continue to have progressive pain and severe functional limitations and dysfunction. They have failed non-operative management including home exercise, medications, and injections. It is felt that they would benefit from undergoing total joint replacement. Risks and benefits of the procedure have been discussed with the patient and they elect to proceed with surgery. There are no active contraindications to surgery such as ongoing infection or rapidly progressive neurological  disease.   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 First Degree Relatives  reported Heart Disease  Father. Heart disease in female family member before age 2  Hypertension  Brother.  Social History  Current drinker  01/22/2015: Currently drinks only occasionally per week Exercise  does other No history of drug/alcohol rehab  Not under pain contract  Tobacco / smoke exposure  01/22/2015: no Tobacco use  Never smoker. 01/22/2015 Advance Directives  Living Will  Medication History  Cephalexin (Oral) Specific strength unknown - Active. Metoprolol Succinate ER (50MG  Tablet ER 24HR, Oral) Active. (75 mg qd) Hydrochlorothiazide (25MG  Tablet, Oral) Active. (qd) Simvastatin (40MG  Tablet, Oral) Active. (qd) Aspirin EC (81MG  Tablet DR, Oral) Active. (qd) Vitamin D (1000UNIT Capsule, Oral) Active. (qd) Meloxicam Active.   Past Surgical History Colon Polyp Removal - Colonoscopy   Review of Systems General Not Present- Chills, Fatigue, Fever, Memory Loss, Night Sweats, Weight Gain and Weight Loss. Skin Not Present- Eczema, Hives, Itching, Lesions and Rash. HEENT Not Present- Dentures, Double Vision, Headache, Hearing Loss, Tinnitus and Visual Loss. Respiratory Not Present- Allergies, Chronic Cough, Coughing up blood, Shortness of breath at rest and Shortness of breath with exertion. Cardiovascular Not Present- Chest Pain, Difficulty Breathing Lying Down, Murmur,  Palpitations, Racing/skipping  heartbeats and Swelling. Gastrointestinal Not Present- Abdominal Pain, Bloody Stool, Constipation, Diarrhea, Difficulty Swallowing, Heartburn, Jaundice, Loss of appetitie, Nausea and Vomiting. Female Genitourinary Present- Urinary frequency and Urinating at Night. Not Present- Blood in Urine, Discharge, Flank Pain, Incontinence, Painful Urination, Urgency, Urinary Retention and Weak urinary stream. Musculoskeletal Present- Morning Stiffness. Not Present- Back Pain, Joint Pain, Joint Swelling, Muscle Pain, Muscle Weakness and Spasms. Neurological Not Present- Blackout spells, Difficulty with balance, Dizziness, Paralysis, Tremor and Weakness. Psychiatric Not Present- Insomnia.  Vitals Weight: 186 lb Height: 62in Weight was reported by patient. Height was reported by patient. Body Surface Area: 1.85 m Body Mass Index: 34.02 kg/m  Pulse: 64 (Regular)  BP: 144/78 (Sitting, Right Arm, Standard)   Physical Exam  General Mental Status -Alert, cooperative and good historian. General Appearance-pleasant, Not in acute distress. Orientation-Oriented X3. Build & Nutrition-Well nourished and Well developed.  Head and Neck Head-normocephalic, atraumatic . Neck Global Assessment - supple, no bruit auscultated on the right, no bruit auscultated on the left.  Eye Pupil - Bilateral-Regular and Round. Motion - Bilateral-EOMI.  Chest and Lung Exam Auscultation Breath sounds - clear at anterior chest wall and clear at posterior chest wall. Adventitious sounds - No Adventitious sounds.  Cardiovascular Auscultation Rhythm - Regular rate and rhythm. Heart Sounds - S1 WNL and S2 WNL. Murmurs & Other Heart Sounds - Auscultation of the heart reveals - No Murmurs.  Abdomen Palpation/Percussion Tenderness - Abdomen is non-tender to palpation. Rigidity (guarding) - Abdomen is soft. Auscultation Auscultation of the abdomen reveals - Bowel sounds normal.  Female  Genitourinary Note: Not done, not pertinent to present illness   Musculoskeletal Note: On exam, she is in no distress. Evaluation of both knees showed no effusion. Range of about 5 to 125 on each side. There is moderate crepitus on range of motion of the knees. Some tenderness medial greater than lateral with no stability noted.   Assessment & Plan Primary osteoarthritis of right knee (M17.11) Primary osteoarthritis of left knee (M17.12)  Note:Surgical Plans: Left Total Knee Replacement  Disposition: Home with Family  Cards: Dr. Claiborne Billings - Patient has been seen preoperatively and felt to be stable for surgery.  IV TXA  Anesthesia Issues: None  Patient was instructed on what medications to stop prior to surgery.  Signed electronically by Joelene Millin, III PA-C

## 2017-02-15 ENCOUNTER — Encounter (HOSPITAL_COMMUNITY)
Admission: RE | Admit: 2017-02-15 | Discharge: 2017-02-15 | Disposition: A | Discharge status: Home / Self Care | Payer: Medicare Other | Source: Ambulatory Visit | Attending: Orthopedic Surgery | Admitting: Orthopedic Surgery

## 2017-02-15 ENCOUNTER — Encounter (HOSPITAL_COMMUNITY): Payer: Self-pay

## 2017-02-15 DIAGNOSIS — Z0183 Encounter for blood typing: Secondary | ICD-10-CM | POA: Insufficient documentation

## 2017-02-15 DIAGNOSIS — M1712 Unilateral primary osteoarthritis, left knee: Secondary | ICD-10-CM | POA: Insufficient documentation

## 2017-02-15 DIAGNOSIS — Z01812 Encounter for preprocedural laboratory examination: Secondary | ICD-10-CM | POA: Insufficient documentation

## 2017-02-15 LAB — CBC
HCT: 53.1 % — ABNORMAL HIGH (ref 36.0–46.0)
Hemoglobin: 19.2 g/dL — ABNORMAL HIGH (ref 12.0–15.0)
MCH: 31.6 pg (ref 26.0–34.0)
MCHC: 36.2 g/dL — ABNORMAL HIGH (ref 30.0–36.0)
MCV: 87.5 fL (ref 78.0–100.0)
Platelets: 173 10*3/uL (ref 150–400)
RBC: 6.07 MIL/uL — ABNORMAL HIGH (ref 3.87–5.11)
RDW: 13.4 % (ref 11.5–15.5)
WBC: 7.1 10*3/uL (ref 4.0–10.5)

## 2017-02-15 LAB — COMPREHENSIVE METABOLIC PANEL
ALT: 18 U/L (ref 14–54)
AST: 23 U/L (ref 15–41)
Albumin: 3.8 g/dL (ref 3.5–5.0)
Alkaline Phosphatase: 49 U/L (ref 38–126)
Anion gap: 8 (ref 5–15)
BUN: 13 mg/dL (ref 6–20)
CO2: 26 mmol/L (ref 22–32)
Calcium: 9.3 mg/dL (ref 8.9–10.3)
Chloride: 105 mmol/L (ref 101–111)
Creatinine, Ser: 0.44 mg/dL (ref 0.44–1.00)
GFR calc Af Amer: >60 mL/min (ref >60)
GFR calc non Af Amer: >60 mL/min (ref >60)
Glucose, Bld: 89 mg/dL (ref 65–99)
Potassium: 3.7 mmol/L (ref 3.5–5.1)
Sodium: 139 mmol/L (ref 135–145)
Total Bilirubin: 1.1 mg/dL (ref 0.3–1.2)
Total Protein: 6.6 g/dL (ref 6.5–8.1)

## 2017-02-15 LAB — SURGICAL PCR SCREEN
MRSA, PCR: NEGATIVE
Staphylococcus aureus: NEGATIVE

## 2017-02-15 LAB — PROTIME-INR
INR: 1
Prothrombin Time: 13.2 seconds (ref 11.4–15.2)

## 2017-02-15 LAB — APTT: aPTT: 37 seconds — ABNORMAL HIGH (ref 24–36)

## 2017-02-15 LAB — ABO/RH: ABO/RH(D): AB POS

## 2017-02-16 NOTE — Progress Notes (Signed)
Patient called and stated she had misplaced her soap and preop instructions.  Called patient and patient drove by [place] circle entrance and nurse took to patient in a bag a copy of preop instructions and more hibiclens soap.

## 2017-02-21 ENCOUNTER — Inpatient Hospital Stay (HOSPITAL_COMMUNITY)
Admission: RE | Admit: 2017-02-21 | Discharge: 2017-02-24 | DRG: 470 | Disposition: A | Discharge status: Home / Self Care | Payer: Medicare Other | Source: Ambulatory Visit | Attending: Orthopedic Surgery | Admitting: Orthopedic Surgery

## 2017-02-21 ENCOUNTER — Inpatient Hospital Stay (HOSPITAL_COMMUNITY): Payer: Medicare Other | Admitting: Certified Registered"

## 2017-02-21 ENCOUNTER — Encounter (HOSPITAL_COMMUNITY): Payer: Self-pay | Admitting: *Deleted

## 2017-02-21 ENCOUNTER — Encounter (HOSPITAL_COMMUNITY)
Admission: RE | Disposition: A | Discharge status: Home / Self Care | Payer: Self-pay | Source: Ambulatory Visit | Attending: Orthopedic Surgery

## 2017-02-21 DIAGNOSIS — Z8249 Family history of ischemic heart disease and other diseases of the circulatory system: Secondary | ICD-10-CM

## 2017-02-21 DIAGNOSIS — G8918 Other acute postprocedural pain: Secondary | ICD-10-CM | POA: Diagnosis not present

## 2017-02-21 DIAGNOSIS — K5790 Diverticulosis of intestine, part unspecified, without perforation or abscess without bleeding: Secondary | ICD-10-CM | POA: Diagnosis not present

## 2017-02-21 DIAGNOSIS — E7800 Pure hypercholesterolemia, unspecified: Secondary | ICD-10-CM | POA: Diagnosis present

## 2017-02-21 DIAGNOSIS — Z833 Family history of diabetes mellitus: Secondary | ICD-10-CM

## 2017-02-21 DIAGNOSIS — M170 Bilateral primary osteoarthritis of knee: Principal | ICD-10-CM | POA: Diagnosis present

## 2017-02-21 DIAGNOSIS — Z7982 Long term (current) use of aspirin: Secondary | ICD-10-CM | POA: Diagnosis not present

## 2017-02-21 DIAGNOSIS — I10 Essential (primary) hypertension: Secondary | ICD-10-CM | POA: Diagnosis present

## 2017-02-21 DIAGNOSIS — E785 Hyperlipidemia, unspecified: Secondary | ICD-10-CM | POA: Diagnosis not present

## 2017-02-21 DIAGNOSIS — M1710 Unilateral primary osteoarthritis, unspecified knee: Secondary | ICD-10-CM

## 2017-02-21 DIAGNOSIS — M25562 Pain in left knee: Secondary | ICD-10-CM | POA: Diagnosis not present

## 2017-02-21 DIAGNOSIS — M1712 Unilateral primary osteoarthritis, left knee: Secondary | ICD-10-CM | POA: Diagnosis not present

## 2017-02-21 DIAGNOSIS — M179 Osteoarthritis of knee, unspecified: Secondary | ICD-10-CM | POA: Diagnosis present

## 2017-02-21 HISTORY — PX: TOTAL KNEE ARTHROPLASTY: SHX125

## 2017-02-21 LAB — TYPE AND SCREEN
ABO/RH(D): AB POS
Antibody Screen: NEGATIVE

## 2017-02-21 SURGERY — ARTHROPLASTY, KNEE, TOTAL
Anesthesia: Monitor Anesthesia Care | Site: Knee | Laterality: Left

## 2017-02-21 MED ORDER — HYDROCHLOROTHIAZIDE 25 MG PO TABS
25.0000 mg | ORAL_TABLET | Freq: Every day | ORAL | Status: DC
  Administered 2017-02-22: 25 mg via ORAL
  Filled 2017-02-21 (×3): qty 1

## 2017-02-21 MED ORDER — PHENOL 1.4 % MT LIQD: 1.0000 | OROMUCOSAL | Status: DC | PRN

## 2017-02-21 MED ORDER — MIDAZOLAM HCL 5 MG/5ML IJ SOLN
2.0000 mg | Freq: Once | INTRAMUSCULAR | Status: AC
  Administered 2017-02-21: 2 mg via INTRAVENOUS

## 2017-02-21 MED ORDER — ONDANSETRON HCL 4 MG/2ML IJ SOLN
INTRAMUSCULAR | Status: AC
  Filled 2017-02-21: qty 2

## 2017-02-21 MED ORDER — BUPIVACAINE LIPOSOME 1.3 % IJ SUSP
INTRAMUSCULAR | Status: DC | PRN
  Administered 2017-02-21: 20 mL

## 2017-02-21 MED ORDER — ACETAMINOPHEN 325 MG PO TABS: 650.0000 mg | ORAL_TABLET | Freq: Four times a day (QID) | ORAL | Status: DC | PRN

## 2017-02-21 MED ORDER — STERILE WATER FOR IRRIGATION IR SOLN
Status: DC | PRN
  Administered 2017-02-21: 2000 mL

## 2017-02-21 MED ORDER — MENTHOL 3 MG MT LOZG: 1.0000 | LOZENGE | OROMUCOSAL | Status: DC | PRN

## 2017-02-21 MED ORDER — FLEET ENEMA 7-19 GM/118ML RE ENEM: 1.0000 | ENEMA | Freq: Once | RECTAL | Status: DC | PRN

## 2017-02-21 MED ORDER — PROPOFOL 10 MG/ML IV BOLUS
INTRAVENOUS | Status: AC
  Filled 2017-02-21: qty 20

## 2017-02-21 MED ORDER — RIVAROXABAN 10 MG PO TABS
10.0000 mg | ORAL_TABLET | Freq: Every day | ORAL | Status: DC
  Administered 2017-02-22 – 2017-02-24 (×3): 10 mg via ORAL
  Filled 2017-02-21 (×3): qty 1

## 2017-02-21 MED ORDER — BUPIVACAINE HCL (PF) 0.5 % IJ SOLN
INTRAMUSCULAR | Status: DC | PRN
  Administered 2017-02-21: 3 mL

## 2017-02-21 MED ORDER — DEXAMETHASONE SODIUM PHOSPHATE 10 MG/ML IJ SOLN
INTRAMUSCULAR | Status: AC
  Filled 2017-02-21: qty 1

## 2017-02-21 MED ORDER — SODIUM CHLORIDE 0.9 % IJ SOLN
INTRAMUSCULAR | Status: DC | PRN
  Administered 2017-02-21: 30 mL

## 2017-02-21 MED ORDER — ACETAMINOPHEN 500 MG PO TABS
1000.0000 mg | ORAL_TABLET | Freq: Four times a day (QID) | ORAL | Status: AC
Start: 2017-02-21 — End: 2017-02-22
  Administered 2017-02-21 – 2017-02-22 (×4): 1000 mg via ORAL
  Filled 2017-02-21 (×5): qty 2

## 2017-02-21 MED ORDER — ONDANSETRON HCL 4 MG PO TABS
4.0000 mg | ORAL_TABLET | Freq: Four times a day (QID) | ORAL | Status: DC | PRN
Start: 2017-02-21 — End: 2017-02-24

## 2017-02-21 MED ORDER — METHOCARBAMOL 500 MG PO TABS
500.0000 mg | ORAL_TABLET | Freq: Four times a day (QID) | ORAL | Status: DC | PRN
  Administered 2017-02-22 – 2017-02-24 (×3): 500 mg via ORAL
  Filled 2017-02-21 (×4): qty 1

## 2017-02-21 MED ORDER — PROPOFOL 500 MG/50ML IV EMUL
INTRAVENOUS | Status: DC | PRN
  Administered 2017-02-21: 75 ug/kg/min via INTRAVENOUS

## 2017-02-21 MED ORDER — HYDROMORPHONE HCL 1 MG/ML IJ SOLN: 0.2500 mg | INTRAMUSCULAR | Status: DC | PRN

## 2017-02-21 MED ORDER — SIMVASTATIN 20 MG PO TABS
40.0000 mg | ORAL_TABLET | Freq: Every day | ORAL | Status: DC
  Administered 2017-02-21 – 2017-02-23 (×3): 40 mg via ORAL
  Filled 2017-02-21 (×3): qty 2

## 2017-02-21 MED ORDER — ACETAMINOPHEN 10 MG/ML IV SOLN
INTRAVENOUS | Status: AC
  Filled 2017-02-21: qty 100

## 2017-02-21 MED ORDER — CEFAZOLIN SODIUM-DEXTROSE 2-4 GM/100ML-% IV SOLN
INTRAVENOUS | Status: AC
  Filled 2017-02-21: qty 100

## 2017-02-21 MED ORDER — OXYCODONE HCL 5 MG PO TABS
5.0000 mg | ORAL_TABLET | ORAL | Status: DC | PRN
  Administered 2017-02-21 (×3): 5 mg via ORAL
  Administered 2017-02-22 – 2017-02-23 (×8): 10 mg via ORAL
  Administered 2017-02-23 (×2): 5 mg via ORAL
  Administered 2017-02-23 – 2017-02-24 (×3): 10 mg via ORAL
  Administered 2017-02-24: 5 mg via ORAL
  Administered 2017-02-24: 10 mg via ORAL
  Filled 2017-02-21: qty 1
  Filled 2017-02-21 (×7): qty 2
  Filled 2017-02-21: qty 1
  Filled 2017-02-21: qty 2
  Filled 2017-02-21: qty 1
  Filled 2017-02-21 (×5): qty 2
  Filled 2017-02-21: qty 1
  Filled 2017-02-21 (×2): qty 2

## 2017-02-21 MED ORDER — METOPROLOL SUCCINATE ER 50 MG PO TB24
75.0000 mg | ORAL_TABLET | Freq: Every day | ORAL | Status: DC
  Administered 2017-02-22 – 2017-02-23 (×2): 75 mg via ORAL
  Filled 2017-02-21 (×3): qty 1

## 2017-02-21 MED ORDER — SODIUM CHLORIDE 0.9 % IR SOLN
Status: DC | PRN
  Administered 2017-02-21: 1000 mL

## 2017-02-21 MED ORDER — BISACODYL 10 MG RE SUPP: 10.0000 mg | Freq: Every day | RECTAL | Status: DC | PRN

## 2017-02-21 MED ORDER — TRANEXAMIC ACID 1000 MG/10ML IV SOLN
1000.0000 mg | INTRAVENOUS | Status: AC
  Administered 2017-02-21: 1000 mg via INTRAVENOUS
  Filled 2017-02-21: qty 1100

## 2017-02-21 MED ORDER — METOCLOPRAMIDE HCL 5 MG PO TABS: 5.0000 mg | ORAL_TABLET | Freq: Three times a day (TID) | ORAL | Status: DC | PRN

## 2017-02-21 MED ORDER — BUPIVACAINE LIPOSOME 1.3 % IJ SUSP
20.0000 mL | Freq: Once | INTRAMUSCULAR | Status: DC
  Filled 2017-02-21: qty 20

## 2017-02-21 MED ORDER — METHOCARBAMOL 1000 MG/10ML IJ SOLN
500.0000 mg | Freq: Four times a day (QID) | INTRAMUSCULAR | Status: DC | PRN
  Filled 2017-02-21: qty 5

## 2017-02-21 MED ORDER — BUPIVACAINE HCL (PF) 0.5 % IJ SOLN
INTRAMUSCULAR | Status: AC
  Filled 2017-02-21: qty 30

## 2017-02-21 MED ORDER — FENTANYL CITRATE (PF) 100 MCG/2ML IJ SOLN
100.0000 ug | Freq: Once | INTRAMUSCULAR | Status: AC
  Administered 2017-02-21 (×2): 50 ug via INTRAVENOUS

## 2017-02-21 MED ORDER — GABAPENTIN 300 MG PO CAPS
ORAL_CAPSULE | ORAL | Status: AC
  Filled 2017-02-21: qty 1

## 2017-02-21 MED ORDER — DEXAMETHASONE SODIUM PHOSPHATE 10 MG/ML IJ SOLN
10.0000 mg | Freq: Once | INTRAMUSCULAR | Status: AC
  Administered 2017-02-21: 10 mg via INTRAVENOUS

## 2017-02-21 MED ORDER — LACTATED RINGERS IV SOLN
INTRAVENOUS | Status: DC
  Administered 2017-02-21 (×3): via INTRAVENOUS

## 2017-02-21 MED ORDER — ACETAMINOPHEN 10 MG/ML IV SOLN
1000.0000 mg | Freq: Once | INTRAVENOUS | Status: AC
  Administered 2017-02-21: 1000 mg via INTRAVENOUS

## 2017-02-21 MED ORDER — ONDANSETRON HCL 4 MG/2ML IJ SOLN: 4.0000 mg | Freq: Four times a day (QID) | INTRAMUSCULAR | Status: DC | PRN

## 2017-02-21 MED ORDER — METOCLOPRAMIDE HCL 5 MG/ML IJ SOLN: 5.0000 mg | Freq: Three times a day (TID) | INTRAMUSCULAR | Status: DC | PRN

## 2017-02-21 MED ORDER — CEFAZOLIN SODIUM-DEXTROSE 2-4 GM/100ML-% IV SOLN
2.0000 g | INTRAVENOUS | Status: AC
  Administered 2017-02-21: 2 g via INTRAVENOUS

## 2017-02-21 MED ORDER — MORPHINE SULFATE (PF) 2 MG/ML IV SOLN
1.0000 mg | INTRAVENOUS | Status: DC | PRN
  Administered 2017-02-21 (×2): 1 mg via INTRAVENOUS
  Filled 2017-02-21 (×2): qty 1

## 2017-02-21 MED ORDER — CEFAZOLIN SODIUM-DEXTROSE 2-4 GM/100ML-% IV SOLN
2.0000 g | Freq: Four times a day (QID) | INTRAVENOUS | Status: AC
  Administered 2017-02-21 (×2): 2 g via INTRAVENOUS
  Filled 2017-02-21 (×2): qty 100

## 2017-02-21 MED ORDER — MEPERIDINE HCL 50 MG/ML IJ SOLN: 6.2500 mg | INTRAMUSCULAR | Status: DC | PRN

## 2017-02-21 MED ORDER — DOCUSATE SODIUM 100 MG PO CAPS
100.0000 mg | ORAL_CAPSULE | Freq: Two times a day (BID) | ORAL | Status: DC
  Administered 2017-02-21 – 2017-02-24 (×6): 100 mg via ORAL
  Filled 2017-02-21 (×6): qty 1

## 2017-02-21 MED ORDER — 0.9 % SODIUM CHLORIDE (POUR BTL) OPTIME
TOPICAL | Status: DC | PRN
  Administered 2017-02-21: 1000 mL

## 2017-02-21 MED ORDER — GABAPENTIN 300 MG PO CAPS
300.0000 mg | ORAL_CAPSULE | Freq: Once | ORAL | Status: AC
  Administered 2017-02-21: 300 mg via ORAL

## 2017-02-21 MED ORDER — HYDROXYZINE HCL 10 MG PO TABS
10.0000 mg | ORAL_TABLET | Freq: Every evening | ORAL | Status: DC | PRN
  Filled 2017-02-21: qty 1

## 2017-02-21 MED ORDER — CHLORHEXIDINE GLUCONATE 4 % EX LIQD: 60.0000 mL | Freq: Once | CUTANEOUS | Status: DC

## 2017-02-21 MED ORDER — ACETAMINOPHEN 650 MG RE SUPP: 650.0000 mg | Freq: Four times a day (QID) | RECTAL | Status: DC | PRN

## 2017-02-21 MED ORDER — DIPHENHYDRAMINE HCL 12.5 MG/5ML PO ELIX: 12.5000 mg | ORAL_SOLUTION | ORAL | Status: DC | PRN

## 2017-02-21 MED ORDER — DEXAMETHASONE SODIUM PHOSPHATE 10 MG/ML IJ SOLN
10.0000 mg | Freq: Once | INTRAMUSCULAR | Status: AC
  Administered 2017-02-22: 10 mg via INTRAVENOUS
  Filled 2017-02-21: qty 1

## 2017-02-21 MED ORDER — SODIUM CHLORIDE 0.9 % IJ SOLN
INTRAMUSCULAR | Status: AC
  Filled 2017-02-21: qty 50

## 2017-02-21 MED ORDER — MIDAZOLAM HCL 2 MG/2ML IJ SOLN
INTRAMUSCULAR | Status: AC
  Filled 2017-02-21: qty 2

## 2017-02-21 MED ORDER — FENTANYL CITRATE (PF) 100 MCG/2ML IJ SOLN
INTRAMUSCULAR | Status: AC
  Administered 2017-02-21: 50 ug
  Filled 2017-02-21: qty 2

## 2017-02-21 MED ORDER — TRAMADOL HCL 50 MG PO TABS
50.0000 mg | ORAL_TABLET | Freq: Four times a day (QID) | ORAL | Status: DC | PRN
Start: 2017-02-21 — End: 2017-02-24
  Administered 2017-02-22: 50 mg via ORAL
  Filled 2017-02-21: qty 1

## 2017-02-21 MED ORDER — ONDANSETRON HCL 4 MG/2ML IJ SOLN
INTRAMUSCULAR | Status: DC | PRN
  Administered 2017-02-21: 4 mg via INTRAVENOUS

## 2017-02-21 MED ORDER — POLYETHYLENE GLYCOL 3350 17 G PO PACK
17.0000 g | PACK | Freq: Every day | ORAL | Status: DC | PRN
  Administered 2017-02-23: 17 g via ORAL
  Filled 2017-02-21: qty 1

## 2017-02-21 MED ORDER — SODIUM CHLORIDE 0.9 % IV SOLN
INTRAVENOUS | Status: DC
  Administered 2017-02-21: 17:00:00 via INTRAVENOUS

## 2017-02-21 MED ORDER — ONDANSETRON HCL 4 MG/2ML IJ SOLN: 4.0000 mg | Freq: Once | INTRAMUSCULAR | Status: DC | PRN

## 2017-02-21 MED ORDER — TRANEXAMIC ACID 1000 MG/10ML IV SOLN
1000.0000 mg | Freq: Once | INTRAVENOUS | Status: AC
  Administered 2017-02-21: 1000 mg via INTRAVENOUS
  Filled 2017-02-21: qty 1100

## 2017-02-21 SURGICAL SUPPLY — 49 items
BAG DECANTER FOR FLEXI CONT (MISCELLANEOUS) ×2 IMPLANT
BAG SPEC THK2 15X12 ZIP CLS (MISCELLANEOUS) ×1
BAG ZIPLOCK 12X15 (MISCELLANEOUS) ×2 IMPLANT
BANDAGE ACE 6X5 VEL STRL LF (GAUZE/BANDAGES/DRESSINGS) ×2 IMPLANT
BLADE SAG 18X100X1.27 (BLADE) ×2 IMPLANT
BLADE SAW SGTL 11.0X1.19X90.0M (BLADE) ×2 IMPLANT
BOWL SMART MIX CTS (DISPOSABLE) ×2 IMPLANT
CAPT KNEE TOTAL 3 ATTUNE ×1 IMPLANT
CEMENT HV SMART SET (Cement) ×4 IMPLANT
COVER SURGICAL LIGHT HANDLE (MISCELLANEOUS) ×2 IMPLANT
CUFF TOURN SGL QUICK 34 (TOURNIQUET CUFF) ×2
CUFF TRNQT CYL 34X4X40X1 (TOURNIQUET CUFF) ×1 IMPLANT
DECANTER SPIKE VIAL GLASS SM (MISCELLANEOUS) ×2 IMPLANT
DRAPE U-SHAPE 47X51 STRL (DRAPES) ×2 IMPLANT
DRSG ADAPTIC 3X8 NADH LF (GAUZE/BANDAGES/DRESSINGS) ×2 IMPLANT
DRSG PAD ABDOMINAL 8X10 ST (GAUZE/BANDAGES/DRESSINGS) ×2 IMPLANT
DURAPREP 26ML APPLICATOR (WOUND CARE) ×2 IMPLANT
ELECT REM PT RETURN 15FT ADLT (MISCELLANEOUS) ×2 IMPLANT
EVACUATOR 1/8 PVC DRAIN (DRAIN) ×2 IMPLANT
GAUZE SPONGE 4X4 12PLY STRL (GAUZE/BANDAGES/DRESSINGS) ×2 IMPLANT
GLOVE BIO SURGEON STRL SZ 6.5 (GLOVE) ×1 IMPLANT
GLOVE BIO SURGEON STRL SZ8 (GLOVE) ×2 IMPLANT
GLOVE BIOGEL PI IND STRL 6.5 (GLOVE) IMPLANT
GLOVE BIOGEL PI IND STRL 7.5 (GLOVE) IMPLANT
GLOVE BIOGEL PI IND STRL 8 (GLOVE) ×1 IMPLANT
GLOVE BIOGEL PI INDICATOR 6.5 (GLOVE) ×2
GLOVE BIOGEL PI INDICATOR 7.5 (GLOVE) ×4
GLOVE BIOGEL PI INDICATOR 8 (GLOVE) ×1
GLOVE SURG SS PI 6.5 STRL IVOR (GLOVE) ×1 IMPLANT
GOWN STRL REUS W/TWL LRG LVL3 (GOWN DISPOSABLE) ×3 IMPLANT
GOWN STRL REUS W/TWL XL LVL3 (GOWN DISPOSABLE) ×2 IMPLANT
HANDPIECE INTERPULSE COAX TIP (DISPOSABLE) ×2
IMMOBILIZER KNEE 20 (SOFTGOODS) ×2
IMMOBILIZER KNEE 20 THIGH 36 (SOFTGOODS) ×1 IMPLANT
MANIFOLD NEPTUNE II (INSTRUMENTS) ×2 IMPLANT
PACK TOTAL KNEE CUSTOM (KITS) ×2 IMPLANT
PADDING CAST COTTON 6X4 STRL (CAST SUPPLIES) ×6 IMPLANT
POSITIONER SURGICAL ARM (MISCELLANEOUS) ×2 IMPLANT
SET HNDPC FAN SPRY TIP SCT (DISPOSABLE) ×1 IMPLANT
STRIP CLOSURE SKIN 1/2X4 (GAUZE/BANDAGES/DRESSINGS) ×4 IMPLANT
SUT MNCRL AB 4-0 PS2 18 (SUTURE) ×2 IMPLANT
SUT STRATAFIX 0 PDS 27 VIOLET (SUTURE) ×2
SUT VIC AB 2-0 CT1 27 (SUTURE) ×6
SUT VIC AB 2-0 CT1 TAPERPNT 27 (SUTURE) ×3 IMPLANT
SUTURE STRATFX 0 PDS 27 VIOLET (SUTURE) ×1 IMPLANT
SYR 50ML LL SCALE MARK (SYRINGE) IMPLANT
TRAY FOLEY CATH SILVER 14FR (SET/KITS/TRAYS/PACK) ×1 IMPLANT
WRAP KNEE MAXI GEL POST OP (GAUZE/BANDAGES/DRESSINGS) ×2 IMPLANT
YANKAUER SUCT BULB TIP 10FT TU (MISCELLANEOUS) ×2 IMPLANT

## 2017-02-21 NOTE — Anesthesia Preprocedure Evaluation (Signed)
Anesthesia Evaluation  Patient identified by MRN, date of birth, ID band Patient awake    Reviewed: Allergy & Precautions, NPO status , Patient's Chart, lab work & pertinent test results  Airway Mallampati: I  TM Distance: >3 FB Neck ROM: Full    Dental   Pulmonary    Pulmonary exam normal        Cardiovascular hypertension, Pt. on medications Normal cardiovascular exam     Neuro/Psych    GI/Hepatic GERD  Medicated and Controlled,  Endo/Other    Renal/GU      Musculoskeletal   Abdominal   Peds  Hematology   Anesthesia Other Findings   Reproductive/Obstetrics                             Anesthesia Physical Anesthesia Plan  ASA: II  Anesthesia Plan: Spinal and MAC   Post-op Pain Management:  Regional for Post-op pain   Induction: Intravenous  Airway Management Planned: Simple Face Mask  Additional Equipment:   Intra-op Plan:   Post-operative Plan:   Informed Consent: I have reviewed the patients History and Physical, chart, labs and discussed the procedure including the risks, benefits and alternatives for the proposed anesthesia with the patient or authorized representative who has indicated his/her understanding and acceptance.     Plan Discussed with: CRNA and Surgeon  Anesthesia Plan Comments:         Anesthesia Quick Evaluation

## 2017-02-21 NOTE — Anesthesia Postprocedure Evaluation (Signed)
Anesthesia Post Note  Patient: Jade Hernandez  Procedure(s) Performed: Procedure(s) (LRB): LEFT TOTAL KNEE ARTHROPLASTY (Left)  Patient location during evaluation: PACU Anesthesia Type: Spinal Level of consciousness: oriented and awake and alert Pain management: pain level controlled Vital Signs Assessment: post-procedure vital signs reviewed and stable Respiratory status: spontaneous breathing, respiratory function stable and patient connected to nasal cannula oxygen Cardiovascular status: blood pressure returned to baseline and stable Postop Assessment: no headache and no backache Anesthetic complications: no       Last Vitals:  Vitals:   02/21/17 1330 02/21/17 1430  BP: 136/69 124/61  Pulse: 61 (!) 58  Resp: 13 11  Temp:      Last Pain:  Vitals:   02/21/17 1430  TempSrc:   PainSc: [place] DAVID

## 2017-02-21 NOTE — Progress Notes (Signed)
AssistedDr. Ossey with left, ultrasound guided, adductor canal block. Side rails up, monitors on throughout procedure. See vital signs in flow sheet. Tolerated Procedure well.  

## 2017-02-21 NOTE — Anesthesia Procedure Notes (Signed)
Spinal  Patient location during procedure: OR Start time: 02/21/2017 11:20 AM End time: 02/21/2017 11:22 AM Staffing Anesthesiologist: Lillia Abed Performed: anesthesiologist  Preanesthetic Checklist Completed: patient identified, surgical consent, pre-op evaluation, timeout performed, IV checked, risks and benefits discussed and monitors and equipment checked Spinal Block Patient position: sitting Prep: DuraPrep Patient monitoring: heart rate, cardiac monitor, continuous pulse ox and blood pressure Approach: right paramedian Location: L3-4 Injection technique: single-shot Needle Needle type: Pencan  Needle gauge: 24 G Needle length: 9 cm Needle insertion depth: 7 cm

## 2017-02-21 NOTE — Op Note (Signed)
OPERATIVE REPORT-TOTAL KNEE ARTHROPLASTY   Pre-operative diagnosis- Osteoarthritis  Left knee(s)  Post-operative diagnosis- Osteoarthritis Left knee(s)  Procedure-  Left  Total Knee Arthroplasty (Depuy Attune)  Surgeon- Dione Plover. Mikaia Janvier, MD  Assistant- Ardeen Jourdain, PA-C   Anesthesia-  Adductor canal block and spinal  EBL-* No blood loss amount entered *   Drains Hemovac  Tourniquet time- 32 minutes @ 607 mm Hg  Complications- None  Condition-PACU - hemodynamically stable.   Brief Clinical Note  Jade Hernandez is a 76 y.o. year old female with end stage OA of her left knee with progressively worsening pain and dysfunction. She has constant pain, with activity and at rest and significant functional deficits with difficulties even with ADLs. She has had extensive non-op management including analgesics, injections of cortisone and viscosupplements, and home exercise program, but remains in significant pain with significant dysfunction. Radiographs show bone on bone arthritis medial and patellofemoral. She presents now for left Total Knee Arthroplasty.    Procedure in detail---   The patient is brought into the operating room and positioned supine on the operating table. After successful administration of  Adductor canal block and spinal,   a tourniquet is placed high on the  Left thigh(s) and the lower extremity is prepped and draped in the usual sterile fashion. Time out is performed by the operating team and then the  Left lower extremity is wrapped in Esmarch, knee flexed and the tourniquet inflated to 300 mmHg.       A midline incision is made with a ten blade through the subcutaneous tissue to the level of the extensor mechanism. A fresh blade is used to make a medial parapatellar arthrotomy. Soft tissue over the proximal medial tibia is subperiosteally elevated to the joint line with a knife and into the semimembranosus bursa with a Cobb elevator. Soft tissue over the  proximal lateral tibia is elevated with attention being paid to avoiding the patellar tendon on the tibial tubercle. The patella is everted, knee flexed 90 degrees and the ACL and PCL are removed. Findings are bone on bone medial and patellofemoral with large global osteophytes.        The drill is used to create a starting hole in the distal femur and the canal is thoroughly irrigated with sterile saline to remove the fatty contents. The 5 degree Left  valgus alignment guide is placed into the femoral canal and the distal femoral cutting block is pinned to remove 10 mm off the distal femur. Resection is made with an oscillating saw.      The tibia is subluxed forward and the menisci are removed. The extramedullary alignment guide is placed referencing proximally at the medial aspect of the tibial tubercle and distally along the second metatarsal axis and tibial crest. The block is pinned to remove 27mm off the more deficient medail  side. Resection is made with an oscillating saw. Size 6is the most appropriate size for the tibia and the proximal tibia is prepared with the modular drill and keel punch for that size.      The femoral sizing guide is placed and size 7 is most appropriate. Rotation is marked off the epicondylar axis and confirmed by creating a rectangular flexion gap at 90 degrees. The size 7 cutting block is pinned in this rotation and the anterior, posterior and chamfer cuts are made with the oscillating saw. The intercondylar block is then placed and that cut is made.      Trial size  6 tibial component, trial size 7 posterior stabilized femur and a 8  mm posterior stabilized rotating platform insert trial is placed. Full extension is achieved with excellent varus/valgus and anterior/posterior balance throughout full range of motion. The patella is everted and thickness measured to be 22  mm. Free hand resection is taken to 12 mm, a 38 template is placed, lug holes are drilled, trial patella is  placed, and it tracks normally. Osteophytes are removed off the posterior femur with the trial in place. All trials are removed and the cut bone surfaces prepared with pulsatile lavage. Cement is mixed and once ready for implantation, the size 6 tibial implant, size  7 posterior stabilized femoral component, and the size 38 patella are cemented in place and the patella is held with the clamp. The trial insert is placed and the knee held in full extension. The Exparel (20 ml mixed with 30 ml saline) is injected into the extensor mechanism, posterior capsule, medial and lateral gutters and subcutaneous tissues.  All extruded cement is removed and once the cement is hard the permanent 8 mm posterior stabilized rotating platform insert is placed into the tibial tray.      The wound is copiously irrigated with saline solution and the extensor mechanism closed over a hemovac drain with #1 V-loc suture. The tourniquet is released for a total tourniquet time of 32  minutes. Flexion against gravity is 149 degrees and the patella tracks normally. Subcutaneous tissue is closed with 2.0 vicryl and subcuticular with running 4.0 Monocryl. The incision is cleaned and dried and steri-strips and a bulky sterile dressing are applied. The limb is placed into a knee immobilizer and the patient is awakened and transported to recovery in stable condition.      Please note that a surgical assistant was a medical necessity for this procedure in order to perform it in a safe and expeditious manner. Surgical assistant was necessary to retract the ligaments and vital neurovascular structures to prevent injury to them and also necessary for proper positioning of the limb to allow for anatomic placement of the prosthesis.   Dione Plover Malka Bocek, MD    02/21/2017, 12:18 PM

## 2017-02-21 NOTE — Transfer of Care (Signed)
Immediate Anesthesia Transfer of Care Note  Patient: Jade Hernandez  Procedure(s) Performed: Procedure(s) with comments: LEFT TOTAL KNEE ARTHROPLASTY (Left) - requests 5mins; Adductor Block  Patient Location: PACU  Anesthesia Type:Regional  Level of Consciousness: awake, alert  and oriented  Airway & Oxygen Therapy: Patient Spontanous Breathing and Patient connected to face mask oxygen  Post-op Assessment: Report given to RN and Post -op Vital signs reviewed and stable  Post vital signs: Reviewed and stable  Last Vitals:  Vitals:   02/21/17 1105 02/21/17 1106  BP:    Pulse: 67 67  Resp: 12 (!) 9  Temp:      Last Pain:  Vitals:   02/21/17 0951  TempSrc: Oral         Complications: No apparent anesthesia complications

## 2017-02-21 NOTE — Anesthesia Procedure Notes (Addendum)
Anesthesia Regional Block: Adductor canal block   Pre-Anesthetic Checklist: ,, timeout performed, Correct Patient, Correct Site, Correct Laterality, Correct Procedure, Correct Position, site marked, Risks and benefits discussed,  Surgical consent,  Pre-op evaluation,  At surgeon's request and post-op pain management  Laterality: Left  Prep: chloraprep       Needles:  Injection technique: Single-shot  Needle Type: Echogenic Stimulator Needle     Needle Length: 9cm  Needle Gauge: 21     Additional Needles:   Procedures: ultrasound guided,,,,,,,,  Narrative:  Start time: 02/21/2017 10:50 AM End time: 02/21/2017 11:00 AM Injection made incrementally with aspirations every 5 mL.  Performed by: Personally  Anesthesiologist: Lillia Abed  Additional Notes: Monitors applied. Patient sedated. Sterile prep and drape,hand hygiene and sterile gloves were used. Relevant anatomy identified.Needle position confirmed.Local anesthetic injected incrementally after negative aspiration. Local anesthetic spread visualized around nerve(s). Vascular puncture avoided. No complications. Image printed for medical record.The patient tolerated the procedure well.    Lillia Abed MD

## 2017-02-22 LAB — CBC
HCT: 31.6 % — ABNORMAL LOW (ref 36.0–46.0)
Hemoglobin: 10.4 g/dL — ABNORMAL LOW (ref 12.0–15.0)
MCH: 26.7 pg (ref 26.0–34.0)
MCHC: 32.9 g/dL (ref 30.0–36.0)
MCV: 81 fL (ref 78.0–100.0)
Platelets: 166 10*3/uL (ref 150–400)
RBC: 3.9 MIL/uL (ref 3.87–5.11)
RDW: 14 % (ref 11.5–15.5)
WBC: 9.4 10*3/uL (ref 4.0–10.5)

## 2017-02-22 LAB — BASIC METABOLIC PANEL
Anion gap: 5 (ref 5–15)
BUN: 11 mg/dL (ref 6–20)
CO2: 24 mmol/L (ref 22–32)
Calcium: 8.3 mg/dL — ABNORMAL LOW (ref 8.9–10.3)
Chloride: 109 mmol/L (ref 101–111)
Creatinine, Ser: 0.47 mg/dL (ref 0.44–1.00)
GFR calc Af Amer: >60 mL/min (ref >60)
GFR calc non Af Amer: >60 mL/min (ref >60)
Glucose, Bld: 145 mg/dL — ABNORMAL HIGH (ref 65–99)
Potassium: 3.6 mmol/L (ref 3.5–5.1)
Sodium: 138 mmol/L (ref 135–145)

## 2017-02-22 MED ORDER — RIVAROXABAN 10 MG PO TABS: 10.0000 mg | ORAL_TABLET | Freq: Every day | ORAL | 0 refills | Status: DC

## 2017-02-22 MED ORDER — TRAMADOL HCL 50 MG PO TABS: 50.0000 mg | ORAL_TABLET | Freq: Four times a day (QID) | ORAL | 0 refills | Status: DC | PRN

## 2017-02-22 MED ORDER — OXYCODONE HCL 5 MG PO TABS: 5.0000 mg | ORAL_TABLET | ORAL | 0 refills | Status: DC | PRN

## 2017-02-22 MED ORDER — METHOCARBAMOL 500 MG PO TABS: 500.0000 mg | ORAL_TABLET | Freq: Four times a day (QID) | ORAL | 0 refills | Status: DC | PRN

## 2017-02-22 NOTE — Discharge Instructions (Addendum)
°  ° °Dr. Frank Aluisio °Total Joint Specialist °[facility] °[address] °[place], [place] [address] °[phone] ° °TOTAL KNEE REPLACEMENT POSTOPERATIVE DIRECTIONS ° °Knee Rehabilitation, Guidelines Following Surgery  °Results after knee surgery are often greatly improved when you follow the exercise, range of motion and muscle strengthening exercises prescribed by your doctor. Safety measures are also important to protect the knee from further injury. Any time any of these exercises cause you to have increased pain or swelling in your knee joint, decrease the amount until you are comfortable again and slowly increase them. If you have problems or questions, call your caregiver or physical therapist for advice.  ° °HOME CARE INSTRUCTIONS  °Remove items at home which could result in a fall. This includes throw rugs or furniture in walking pathways.  °· ICE to the affected knee every three hours for 30 minutes at a time and then as needed for pain and swelling.  Continue to use ice on the knee for pain and swelling from surgery. You may notice swelling that will progress down to the foot and ankle.  This is normal after surgery.  Elevate the leg when you are not up walking on it.   °· Continue to use the breathing machine which will help keep your temperature down.  It is common for your temperature to cycle up and down following surgery, especially at night when you are not up moving around and exerting yourself.  The breathing machine keeps your lungs expanded and your temperature down. °· Do not place pillow under knee, focus on keeping the knee straight while resting ° °DIET °You may resume your previous home diet once your are discharged from the hospital. ° °DRESSING / WOUND CARE / SHOWERING °You may shower 3 days after surgery, but keep the wounds dry during showering.  You may use an occlusive plastic wrap (Press'n Seal for example), NO SOAKING/SUBMERGING IN THE BATHTUB.  If the  bandage gets wet, change with a clean dry gauze.  If the incision gets wet, pat the wound dry with a clean towel. °You may start showering once you are discharged home but do not submerge the incision under water. Just pat the incision dry and apply a dry gauze dressing on daily. °Change the surgical dressing daily and reapply a dry dressing each time. ° °ACTIVITY °Walk with your walker as instructed. °Use walker as long as suggested by your caregivers. °Avoid periods of inactivity such as sitting longer than an hour when not asleep. This helps prevent blood clots.  °You may resume a sexual relationship in one month or when given the OK by your doctor.  °You may return to work once you are cleared by your doctor.  °Do not drive a car for 6 weeks or until released by you surgeon.  °Do not drive while taking narcotics. ° °WEIGHT BEARING °Weight bearing as tolerated with assist device (walker, cane, etc) as directed, use it as long as suggested by your surgeon or therapist, typically at least 4-6 weeks. ° °POSTOPERATIVE CONSTIPATION PROTOCOL °Constipation - defined medically as fewer than three stools per week and severe constipation as less than one stool per week. ° °One of the most common issues patients have following surgery is constipation.  Even if you have a regular bowel pattern at home, your normal regimen is likely to be disrupted due to multiple reasons following surgery.  Combination of anesthesia, postoperative narcotics, change in appetite and fluid intake all can affect your   bowels.  In order to avoid complications following surgery, here are some recommendations in order to help you during your recovery period. ° °Colace (docusate) - Pick up an over-the-counter form of Colace or another stool softener and take twice a day as long as you are requiring postoperative pain medications.  Take with a full glass of water daily.  If you experience loose stools or diarrhea, hold the colace until you stool forms  back up.  If your symptoms do not get better within 1 week or if they get worse, check with your doctor. ° °Dulcolax (bisacodyl) - Pick up over-the-counter and take as directed by the product packaging as needed to assist with the movement of your bowels.  Take with a full glass of water.  Use this product as needed if not relieved by Colace only.  ° °MiraLax (polyethylene glycol) - Pick up over-the-counter to have on hand.  MiraLax is a solution that will increase the amount of water in your bowels to assist with bowel movements.  Take as directed and can mix with a glass of water, juice, soda, coffee, or tea.  Take if you go more than two days without a movement. °Do not use MiraLax more than once per day. Call your doctor if you are still constipated or irregular after using this medication for 7 days in a row. ° °If you continue to have problems with postoperative constipation, please contact the office for further assistance and recommendations.  If you experience "the worst abdominal pain ever" or develop nausea or vomiting, please contact the office immediatly for further recommendations for treatment. ° °ITCHING ° If you experience itching with your medications, try taking only a single pain pill, or even half a pain pill at a time.  You can also use Benadryl over the counter for itching or also to help with sleep.  ° °TED HOSE STOCKINGS °Wear the elastic stockings on both legs for three weeks following surgery during the day but you may remove then at night for sleeping. ° °MEDICATIONS °See your medication summary on the “After Visit Summary” that the nursing staff will review with you prior to discharge.  You may have some home medications which will be placed on hold until you complete the course of blood thinner medication.  It is important for you to complete the blood thinner medication as prescribed by your surgeon.  Continue your approved medications as instructed at time of  discharge. ° °PRECAUTIONS °If you experience chest pain or shortness of breath - call 911 immediately for transfer to the hospital emergency department.  °If you develop a fever greater that 101 F, purulent drainage from wound, increased redness or drainage from wound, foul odor from the wound/dressing, or calf pain - CONTACT YOUR SURGEON.   °                                                °FOLLOW-UP APPOINTMENTS °Make sure you keep all of your appointments after your operation with your surgeon and caregivers. You should call the office at the above phone number and make an appointment for approximately two weeks after the date of your surgery or on the date instructed by your surgeon outlined in the "After Visit Summary". ° ° °RANGE OF MOTION AND STRENGTHENING EXERCISES  °Rehabilitation of the knee is important following a knee   injury or an operation. After just a few days of immobilization, the muscles of the thigh which control the knee become weakened and shrink (atrophy). Knee exercises are designed to build up the tone and strength of the thigh muscles and to improve knee motion. Often times heat used for twenty to thirty minutes before working out will loosen up your tissues and help with improving the range of motion but do not use heat for the first two weeks following surgery. These exercises can be done on a training (exercise) mat, on the floor, on a table or on a bed. Use what ever works the best and is most comfortable for you Knee exercises include:  Leg Lifts - While your knee is still immobilized in a splint or cast, you can do straight leg raises. Lift the leg to 60 degrees, hold for 3 sec, and slowly lower the leg. Repeat 10-20 times 2-3 times daily. Perform this exercise against resistance later as your knee gets better.  Quad and Hamstring Sets - Tighten up the muscle on the front of the thigh (Quad) and hold for 5-10 sec. Repeat this 10-20 times hourly. Hamstring sets are done by pushing the  foot backward against an object and holding for 5-10 sec. Repeat as with quad sets.   Leg Slides: Lying on your back, slowly slide your foot toward your buttocks, bending your knee up off the floor (only go as far as is comfortable). Then slowly slide your foot back down until your leg is flat on the floor again.  Angel Wings: Lying on your back spread your legs to the side as far apart as you can without causing discomfort.  A rehabilitation program following serious knee injuries can speed recovery and prevent re-injury in the future due to weakened muscles. Contact your doctor or a physical therapist for more information on knee rehabilitation.   IF YOU ARE TRANSFERRED TO A SKILLED REHAB FACILITY If the patient is transferred to a skilled rehab facility following release from the hospital, a list of the current medications will be sent to the facility for the patient to continue.  When discharged from the skilled rehab facility, please have the facility set up the patient's [place] prior to being released. Also, the skilled facility will be responsible for providing the patient with their medications at time of release from the facility to include their pain medication, the muscle relaxants, and their blood thinner medication. If the patient is still at the rehab facility at time of the two week follow up appointment, the skilled rehab facility will also need to assist the patient in arranging follow up appointment in our office and any transportation needs.  MAKE SURE YOU:  Understand these instructions.  Get help right away if you are not doing well or get worse.    Pick up stool softner and laxative for home use following surgery while on pain medications. Do not submerge incision under water. Please use good hand washing techniques while changing dressing each day. May shower starting three days after surgery. Please use a clean towel to pat the incision dry following  showers. Continue to use ice for pain and swelling after surgery. Do not use any lotions or creams on the incision until instructed by your surgeon.  Take Xarelto for two and a half more weeks following discharge from the hospital, then discontinue Xarelto. Once the patient has completed the Xarelto, they may resume the 81 mg Aspirin.   Information  on my medicine - XARELTO (Rivaroxaban)  This medication education was reviewed with me or my healthcare representative as part of my discharge preparation.  The pharmacist that spoke with me during my hospital stay was:  Minda Ditto, [facility]  Why was Xarelto prescribed for you? Xarelto was prescribed for you to reduce the risk of blood clots forming after orthopedic surgery. The medical term for these abnormal blood clots is venous thromboembolism (VTE).  What do you need to know about xarelto ? Take your Xarelto ONCE DAILY at the same time every day. You may take it either with or without food.  If you have difficulty swallowing the tablet whole, you may crush it and mix in applesauce just prior to taking your dose.  Take Xarelto exactly as prescribed by your doctor and DO NOT stop taking Xarelto without talking to the doctor who prescribed the medication.  Stopping without other VTE prevention medication to take the place of Xarelto may increase your risk of developing a clot.  After discharge, you should have regular check-up appointments with your healthcare provider that is prescribing your Xarelto.    What do you do if you miss a dose? If you miss a dose, take it as soon as you remember on the same day then continue your regularly scheduled once daily regimen the next day. Do not take two doses of Xarelto on the same day.   Important Safety Information A possible side effect of Xarelto is bleeding. You should call your healthcare provider right away if you experience any of the following: ? Bleeding from an injury or your nose  that does not stop. ? Unusual colored urine (red or dark brown) or unusual colored stools (red or black). ? Unusual bruising for unknown reasons. ? A serious fall or if you hit your head (even if there is no bleeding).  Some medicines may interact with Xarelto and might increase your risk of bleeding while on Xarelto. To help avoid this, consult your healthcare provider or pharmacist prior to using any new prescription or non-prescription medications, including herbals, vitamins, non-steroidal anti-inflammatory drugs (NSAIDs) and supplements.  Discussed holding Mobic and Aspirin while on Xarelto  This website has more information on Xarelto: [email or web address]

## 2017-02-22 NOTE — Evaluation (Addendum)
Occupational Therapy Evaluation Patient Details Name: Jade Hernandez MRN: [id number] DOB: [birth year] Today's Date: 02/22/2017    History of Present Illness s/p L TKA    Clinical Impression   Pt is a 76 y/o F who presents with the above. Pt has necessary DME at home and will have assistance from family upon return home with ADL needs as necessary. Provided education on strategies and safe techniques for completion of ADL tasks. Pt has no concerns about completing self care tasks upon return home with family assistance. Education provided and questions answered. No further OT needed at this time.     Follow Up Recommendations  No OT follow up;Supervision/Assistance - 24 hour    Equipment Recommendations  None recommended by OT (Pt has needed DME)           Precautions / Restrictions Precautions Precautions: Fall;Knee Precaution Comments: Educated on knee precautions  Required Braces or Orthoses: Knee Immobilizer - Left Knee Immobilizer - Left: Discontinue once straight leg raise with < 10 degree lag Restrictions Weight Bearing Restrictions: No LLE Weight Bearing: Weight bearing as tolerated      Mobility Bed Mobility Overal bed mobility: Needs Assistance Bed Mobility: Supine to Sit     Supine to sit: Min assist     General bed mobility comments: for LLE   Transfers Overall transfer level: Needs assistance Equipment used: Rolling walker (2 wheeled) Transfers: Sit to/from Stand Sit to Stand: Min guard         General transfer comment: verbal cues for hand/LE placement with RW    Balance Overall balance assessment: Needs assistance Sitting-balance support: Feet supported Sitting balance-Leahy Scale: Fair     Standing balance support: Bilateral upper extremity supported Standing balance-Leahy Scale: Fair                             ADL either performed or assessed with clinical judgement   ADL Overall ADL's : Needs  assistance/impaired Eating/Feeding: Independent   Grooming: Supervision/safety;Sitting;Wash/dry face;Oral care   Upper Body Bathing: Sitting;Supervision/ safety   Lower Body Bathing: Minimal assistance;Sit to/from stand   Upper Body Dressing : Sitting;Supervision/safety   Lower Body Dressing: Moderate assistance;Sit to/from stand Lower Body Dressing Details (indicate cue type and reason): ModA LB dressing; MinGuard sit to/from stand  Toilet Transfer: Min IT trainer Details (indicate cue type and reason): BSC over toilet  Toileting- Clothing Manipulation and Hygiene: Min guard;Sit to/from stand   Tub/ Shower Transfer: Minimal assistance;Tub bench;Rolling walker;Ambulation Tub/Shower Transfer Details (indicate cue type and reason): Reviewed transfer technique with tub bench Functional mobility during ADLs: Min guard;Rolling walker General ADL Comments: Pt completed room level functional mobility to complete ADL tasks; Pt will have assistance from family for LB dressing and ADL needs PRN upon return home. Education provided on knee precautions                          Pertinent Vitals/Pain Pain Assessment: 0-10 Pain Score: 3  Faces Pain Scale: Hurts a little bit Pain Location: L knee Pain Descriptors / Indicators: Sore Pain Intervention(s): Limited activity within patient's tolerance;Monitored during session;Premedicated before session          Extremity/Trunk Assessment Upper Extremity Assessment Upper Extremity Assessment: Overall WFL for tasks assessed           Communication Communication Communication: No difficulties   Cognition Arousal/Alertness: Awake/alert Behavior During Therapy: WFL for tasks assessed/performed  Overall Cognitive Status: Within Functional Limits for tasks assessed                                                      Home Living Family/patient expects to be discharged to:: Private  residence Living Arrangements: Spouse/significant other Available Help at Discharge: Family Type of Home: House Home Access: Stairs to enter [employer] of Steps: 3   Home Layout: Multi-level;Able to live on main level with bedroom/bathroom Alternate Level Stairs-Number of Steps: will stay on main level initially   Bathroom Shower/Tub: Teacher, early years/pre: Standard     Home Equipment: Bedside commode;Tub bench;Shower seat   Additional Comments: Educated on using tub bench vs shower seat to increase safety during shower transfer       Prior Functioning/Environment Level of Independence: Independent                 OT Problem List: Decreased strength;Decreased activity tolerance;Pain            OT Goals(Current goals can be found in the care plan section) Acute Rehab OT Goals Patient Stated Goal: To be more independent  OT Goal Formulation: With patient                                 End of Session Equipment Utilized During Treatment: Gait belt;Rolling walker;Left knee immobilizer  Activity Tolerance: Patient tolerated treatment well Patient left: in chair;with call bell/phone within reach  OT Visit Diagnosis: Muscle weakness (generalized) (M62.81)                Time: 6578-4696 OT Time Calculation (min): 34 min Charges:  OT General Charges $OT Visit: 1 Procedure OT Evaluation $OT Eval Low Complexity: 1 Procedure OT Treatments $Self Care/Home Management : 8-22 mins G-Codes:     [name], OT Pager [phone] 02/22/2017   [name] 02/22/2017, 10:36 AM

## 2017-02-22 NOTE — Progress Notes (Signed)
   Subjective: 1 Day Post-Op Procedure(s) (LRB): LEFT TOTAL KNEE ARTHROPLASTY (Left) Patient reports pain as mild and moderate.   Patient seen in rounds for Dr. Wynelle Link. Patient is well, but has had some minor complaints of pain in the knee, requiring pain medications We will start therapy today.  Plan is to go Home after hospital stay.  Objective: Vital signs in last 24 hours: Temp:  [97.4 F (36.3 C)-99.1 F (37.3 C)] 97.6 F (36.4 C) (04/10 0627) Pulse Rate:  [55-79] 60 (04/10 0627) Resp:  [9-21] 19 (04/10 0627) BP: (102-176)/(47-86) 130/59 (04/10 0627) SpO2:  [94 %-100 %] 98 % (04/10 0627) Weight:  [82.6 kg (182 lb)] 82.6 kg (182 lb) (04/09 0946)  Intake/Output from previous day:  Intake/Output Summary (Last 24 hours) at 02/22/17 0822 Last data filed at 02/22/17 0600  Gross per 24 hour  Intake          4313.75 ml  Output             3030 ml  Net          1283.75 ml    Intake/Output this shift: No intake/output data recorded.  Labs:  Recent Labs  02/22/17 0435  HGB 10.4*    Recent Labs  02/22/17 0435  WBC 9.4  RBC 3.90  HCT 31.6*  PLT 166    Recent Labs  02/22/17 0435  NA 138  K 3.6  CL 109  CO2 24  BUN 11  CREATININE 0.47  GLUCOSE 145*  CALCIUM 8.3*   No results for input(s): LABPT, INR in the last 72 hours.  EXAM General - Patient is Alert, Appropriate and Oriented Extremity - Neurovascular intact Sensation intact distally Intact pulses distally Dorsiflexion/Plantar flexion intact Dressing - dressing C/D/I Motor Function - intact, moving foot and toes well on exam.  Hemovac pulled without difficulty.  Past Medical History:  Diagnosis Date  . Arthritis   . Diverticulosis   . High cholesterol   . Hypertension   . Palpitations   . UTI (urinary tract infection)     Assessment/Plan: 1 Day Post-Op Procedure(s) (LRB): LEFT TOTAL KNEE ARTHROPLASTY (Left) Principal Problem:   Osteoarthritis of knee Active Problems:   OA  (osteoarthritis) of knee  Estimated body mass index is 32.24 kg/m as calculated from the following:   Height as of this encounter: 5\' 3"  (1.6 m).   Weight as of this encounter: 82.6 kg (182 lb). Up with therapy Plan for discharge tomorrow Discharge home with home health versus straight to outpatient therapy  DVT Prophylaxis - Xarelto Weight-Bearing as tolerated to left leg D/C O2 and Pulse OX and try on Room Air  Arlee Muslim, PA-C Orthopaedic Surgery 02/22/2017, 8:22 AM

## 2017-02-22 NOTE — Discharge Summary (Signed)
Physician Discharge Summary   Patient ID: Jade Hernandez MRN: [id number] DOB/AGE: [birth year] 74 y.o.  Admit date: 02/21/2017 Discharge date: 02-24-2017  Primary Diagnosis:  Osteoarthritis  Left knee(s)  Admission Diagnoses:  Past Medical History:  Diagnosis Date  . Arthritis   . Diverticulosis   . High cholesterol   . Hypertension   . Palpitations   . UTI (urinary tract infection)    Discharge Diagnoses:   Principal Problem:   Osteoarthritis of knee Active Problems:   OA (osteoarthritis) of knee  Estimated body mass index is 32.24 kg/m as calculated from the following:   Height as of this encounter: '5\' 3"'  (1.6 m).   Weight as of this encounter: 82.6 kg (182 lb).  Procedure:  Procedure(s) (LRB): LEFT TOTAL KNEE ARTHROPLASTY (Left)   Consults: None  HPI:   Laboratory Data: Admission on 02/21/2017  Component Date Value Ref Range Status  . WBC 02/22/2017 9.4  4.0 - 10.5 K/uL Final  . RBC 02/22/2017 3.90  3.87 - 5.11 MIL/uL Final  . Hemoglobin 02/22/2017 10.4* 12.0 - 15.0 g/dL Final  . HCT 02/22/2017 31.6* 36.0 - 46.0 % Final  . MCV 02/22/2017 81.0  78.0 - 100.0 fL Final  . MCH 02/22/2017 26.7  26.0 - 34.0 pg Final  . MCHC 02/22/2017 32.9  30.0 - 36.0 g/dL Final  . RDW 02/22/2017 14.0  11.5 - 15.5 % Final  . Platelets 02/22/2017 166  150 - 400 K/uL Final  . Sodium 02/22/2017 138  135 - 145 mmol/L Final  . Potassium 02/22/2017 3.6  3.5 - 5.1 mmol/L Final  . Chloride 02/22/2017 109  101 - 111 mmol/L Final  . CO2 02/22/2017 24  22 - 32 mmol/L Final  . Glucose, Bld 02/22/2017 145* 65 - 99 mg/dL Final  . BUN 02/22/2017 11  6 - 20 mg/dL Final  . Creatinine, Ser 02/22/2017 0.47  0.44 - 1.00 mg/dL Final  . Calcium 02/22/2017 8.3* 8.9 - 10.3 mg/dL Final  . GFR calc non Af Amer 02/22/2017 >60  >60 mL/min Final  . GFR calc Af Amer 02/22/2017 >60  >60 mL/min Final   Comment: (NOTE) The eGFR has been calculated using the CKD EPI equation. This calculation has not been  validated in all clinical situations. eGFR's persistently <60 mL/min signify possible Chronic Kidney Disease.   Georgiann Hahn gap 02/22/2017 5  5 - 15 Final  Hospital Outpatient Visit on 02/15/2017  Component Date Value Ref Range Status  . aPTT 02/15/2017 37* 24 - 36 seconds Final   Comment:        IF BASELINE aPTT IS ELEVATED, SUGGEST PATIENT RISK ASSESSMENT BE USED TO DETERMINE APPROPRIATE ANTICOAGULANT THERAPY.   . WBC 02/15/2017 7.1  4.0 - 10.5 K/uL Final  . RBC 02/15/2017 6.07* 3.87 - 5.11 MIL/uL Final  . Hemoglobin 02/15/2017 19.2* 12.0 - 15.0 g/dL Final  . HCT 02/15/2017 53.1* 36.0 - 46.0 % Final  . MCV 02/15/2017 87.5  78.0 - 100.0 fL Final  . MCH 02/15/2017 31.6  26.0 - 34.0 pg Final  . MCHC 02/15/2017 36.2* 30.0 - 36.0 g/dL Final  . RDW 02/15/2017 13.4  11.5 - 15.5 % Final  . Platelets 02/15/2017 173  150 - 400 K/uL Final  . Sodium 02/15/2017 139  135 - 145 mmol/L Final  . Potassium 02/15/2017 3.7  3.5 - 5.1 mmol/L Final  . Chloride 02/15/2017 105  101 - 111 mmol/L Final  . CO2 02/15/2017 26  22 - 32 mmol/L Final  .  Glucose, Bld 02/15/2017 89  65 - 99 mg/dL Final  . BUN 02/15/2017 13  6 - 20 mg/dL Final  . Creatinine, Ser 02/15/2017 0.44  0.44 - 1.00 mg/dL Final  . Calcium 02/15/2017 9.3  8.9 - 10.3 mg/dL Final  . Total Protein 02/15/2017 6.6  6.5 - 8.1 g/dL Final  . Albumin 02/15/2017 3.8  3.5 - 5.0 g/dL Final  . AST 02/15/2017 23  15 - 41 U/L Final  . ALT 02/15/2017 18  14 - 54 U/L Final  . Alkaline Phosphatase 02/15/2017 49  38 - 126 U/L Final  . Total Bilirubin 02/15/2017 1.1  0.3 - 1.2 mg/dL Final  . GFR calc non Af Amer 02/15/2017 >60  >60 mL/min Final  . GFR calc Af Amer 02/15/2017 >60  >60 mL/min Final   Comment: (NOTE) The eGFR has been calculated using the CKD EPI equation. This calculation has not been validated in all clinical situations. eGFR's persistently <60 mL/min signify possible Chronic Kidney Disease.   . Anion gap 02/15/2017 8  5 - 15 Final  .  Prothrombin Time 02/15/2017 13.2  11.4 - 15.2 seconds Final  . INR 02/15/2017 1.00   Final  . ABO/RH(D) 02/15/2017 AB POS   Final  . Antibody Screen 02/15/2017 NEG   Final  . Sample Expiration 02/15/2017 02/24/2017   Final  . Extend sample reason 02/15/2017 NO TRANSFUSIONS OR PREGNANCY IN THE PAST 3 MONTHS   Final  . MRSA, PCR 02/15/2017 NEGATIVE  NEGATIVE Final  . Staphylococcus aureus 02/15/2017 NEGATIVE  NEGATIVE Final   Comment:        The Xpert SA Assay (FDA approved for NASAL specimens in patients over 65 years of age), is one component of a comprehensive surveillance program.  Test performance has been validated by [facility] for patients greater than or equal to 40 year old. It is not intended to diagnose infection nor to guide or monitor treatment.   . ABO/RH(D) 02/15/2017 AB POS   Final     X-Rays:No results found.  EKG: Orders placed or performed in visit on 07/16/16  . EKG 12-Lead     Hospital Course: Jade Hernandez is a 76 y.o. who was admitted to [facility]. They were brought to the operating room on 02/21/2017 and underwent Procedure(s): LEFT TOTAL KNEE ARTHROPLASTY.  Patient tolerated the procedure well and was later transferred to the recovery room and then to the orthopaedic floor for postoperative care.  They were given PO and IV analgesics for pain control following their surgery.  They were given 24 hours of postoperative antibiotics of  Anti-infectives    Start     Dose/Rate Route Frequency Ordered Stop   02/21/17 1730  ceFAZolin (ANCEF) IVPB 2g/100 mL premix     2 g 200 mL/hr over 30 Minutes Intravenous Every 6 hours 02/21/17 1547 02/21/17 2308   02/21/17 1012  ceFAZolin (ANCEF) 2-4 GM/100ML-% IVPB    Comments:  Bridget Hartshorn   : cabinet override      02/21/17 1012 02/21/17 1127   02/21/17 0944  ceFAZolin (ANCEF) IVPB 2g/100 mL premix     2 g 200 mL/hr over 30 Minutes Intravenous On call to O.R. 02/21/17 2947 02/21/17 1127     and  started on DVT prophylaxis in the form of Xarelto.   PT and OT were ordered for total joint protocol.  Discharge planning consulted to help with postop disposition and equipment needs.  Patient had a tough night on the  evening of surgery.  They started to get up OOB with therapy on day one. Hemovac drain was pulled without difficulty.  Continued to work with therapy into day two.  Dressing was changed on day two and the incision was healing well.  By day three, the patient had progressed with therapy and meeting their goals, completed therapy.  Incision was healing well.  Patient was seen in rounds and was ready to go home.  DC home, straight to outpatient therapy Friday. Diet - Cardiac diet Follow up - in 2 weeks Activity - WBAT Disposition - Home Condition Upon Discharge - stable D/C Meds - See DC Summary DVT Prophylaxis - Xarelto   Discharge Instructions    Call MD / Call 911    Complete by:  As directed    If you experience chest pain or shortness of breath, CALL 911 and be transported to the hospital emergency room.  If you develope a fever above 101 F, pus (white drainage) or increased drainage or redness at the wound, or calf pain, call your surgeon's office.   Change dressing    Complete by:  As directed    Change dressing daily with sterile 4 x 4 inch gauze dressing and apply TED hose. Do not submerge the incision under water.   Constipation Prevention    Complete by:  As directed    Drink plenty of fluids.  Prune juice may be helpful.  You may use a stool softener, such as Colace (over the counter) 100 mg twice a day.  Use MiraLax (over the counter) for constipation as needed.   Diet - low sodium heart healthy    Complete by:  As directed    Discharge instructions    Complete by:  As directed    Pick up stool softner and laxative for home use following surgery while on pain medications. Do not submerge incision under water. Please use good hand washing techniques while changing  dressing each day. May shower starting three days after surgery. Please use a clean towel to pat the incision dry following showers. Continue to use ice for pain and swelling after surgery. Do not use any lotions or creams on the incision until instructed by your surgeon.  Wear both TED hose on both legs during the day every day for three weeks, but may have off at night at home.  Postoperative Constipation Protocol  Constipation - defined medically as fewer than three stools per week and severe constipation as less than one stool per week.  One of the most common issues patients have following surgery is constipation.  Even if you have a regular bowel pattern at home, your normal regimen is likely to be disrupted due to multiple reasons following surgery.  Combination of anesthesia, postoperative narcotics, change in appetite and fluid intake all can affect your bowels.  In order to avoid complications following surgery, here are some recommendations in order to help you during your recovery period.  Colace (docusate) - Pick up an over-the-counter form of Colace or another stool softener and take twice a day as long as you are requiring postoperative pain medications.  Take with a full glass of water daily.  If you experience loose stools or diarrhea, hold the colace until you stool forms back up.  If your symptoms do not get better within 1 week or if they get worse, check with your doctor.  Dulcolax (bisacodyl) - Pick up over-the-counter and take as directed by the product packaging as  needed to assist with the movement of your bowels.  Take with a full glass of water.  Use this product as needed if not relieved by Colace only.   MiraLax (polyethylene glycol) - Pick up over-the-counter to have on hand.  MiraLax is a solution that will increase the amount of water in your bowels to assist with bowel movements.  Take as directed and can mix with a glass of water, juice, soda, coffee, or tea.  Take  if you go more than two days without a movement. Do not use MiraLax more than once per day. Call your doctor if you are still constipated or irregular after using this medication for 7 days in a row.  If you continue to have problems with postoperative constipation, please contact the office for further assistance and recommendations.  If you experience "the worst abdominal pain ever" or develop nausea or vomiting, please contact the office immediatly for further recommendations for treatment.   Take Xarelto for two and a half more weeks, then discontinue Xarelto. Once the patient has completed the Xarelto, they may resume the 81 mg Aspirin.   Do not put a pillow under the knee. Place it under the heel.    Complete by:  As directed    Do not sit on low chairs, stoools or toilet seats, as it may be difficult to get up from low surfaces    Complete by:  As directed    Driving restrictions    Complete by:  As directed    No driving until released by the physician.   Increase activity slowly as tolerated    Complete by:  As directed    Lifting restrictions    Complete by:  As directed    No lifting until released by the physician.   Patient may shower    Complete by:  As directed    You may shower without a dressing once there is no drainage.  Do not wash over the wound.  If drainage remains, do not shower until drainage stops.   TED hose    Complete by:  As directed    Use stockings (TED hose) for 3 weeks on both leg(s).  You may remove them at night for sleeping.   Weight bearing as tolerated    Complete by:  As directed      Allergies as of 02/22/2017   No Known Allergies     Medication List    STOP taking these medications   aspirin EC 81 MG tablet   meloxicam 15 MG tablet Commonly known as:  MOBIC   Vitamin D 2000 units Caps     TAKE these medications   acetaminophen 325 MG tablet Commonly known as:  TYLENOL Take 325-487.5 mg by mouth every 6 (six) hours as needed (for  knee pain.).   cephALEXin 250 MG capsule Commonly known as:  KEFLEX Take 250 mg by mouth at bedtime. FOR PREVENTION OF UTI   hydrochlorothiazide 25 MG tablet Commonly known as:  HYDRODIURIL Take 25 mg by mouth daily.   hydrOXYzine 10 MG tablet Commonly known as:  ATARAX/VISTARIL Take 10 mg by mouth at bedtime as needed (for relaxation.).   methocarbamol 500 MG tablet Commonly known as:  ROBAXIN Take 1 tablet (500 mg total) by mouth every 6 (six) hours as needed for muscle spasms.   metoprolol succinate 50 MG 24 hr tablet Commonly known as:  TOPROL-XL TAKE 2 TABLETS BY MOUTH EVERY DAY What changed:  See the  new instructions.   oxyCODONE 5 MG immediate release tablet Commonly known as:  Oxy IR/ROXICODONE Take 1-2 tablets (5-10 mg total) by mouth every 4 (four) hours as needed for moderate pain or severe pain.   rivaroxaban 10 MG Tabs tablet Commonly known as:  XARELTO Take 1 tablet (10 mg total) by mouth daily with breakfast. Take Xarelto for two and a half more weeks following discharge from the hospital, then discontinue Xarelto. Once the patient has completed the Xarelto, they may resume the 81 mg Aspirin. Start taking on:  02/23/2017   simvastatin 40 MG tablet Commonly known as:  ZOCOR Take 40 mg by mouth daily.   traMADol 50 MG tablet Commonly known as:  ULTRAM Take 1-2 tablets (50-100 mg total) by mouth every 6 (six) hours as needed for moderate pain.      Follow-up Information    Gearlean Alf, MD. Schedule an appointment as soon as possible for a visit on 03/08/2017.   Specialty:  Orthopedic Surgery Contact information: [address] [place] [address] [phone]           Signed: Arlee Muslim, PA-C Orthopaedic Surgery 02/22/2017, 8:40 PM

## 2017-02-22 NOTE — Progress Notes (Signed)
   02/22/17 1500  PT Visit Information  Last PT Received On 02/22/17  Assistance Needed +1  History of Present Illness s/p L TKA   Subjective Data  Patient Stated Goal To be more independent   Precautions  Precautions Fall;Knee  Precaution Comments Educated on knee precautions   Required Braces or Orthoses Knee Immobilizer - Left  Knee Immobilizer - Left Discontinue once straight leg raise with < 10 degree lag  Restrictions  Weight Bearing Restrictions No  LLE Weight Bearing WBAT  Pain Assessment  Pain Assessment 0-10  Pain Score 2  Pain Location L knee  Pain Descriptors / Indicators Sore  Pain Intervention(s) Limited activity within patient's tolerance;Monitored during session;Premedicated before session;Ice applied  Cognition  Arousal/Alertness Awake/alert  Behavior During Therapy WFL for tasks assessed/performed  Overall Cognitive Status Within Functional Limits for tasks assessed  Ambulation/Gait  General Gait Details (pt just amb to BR, deferred OOB again)  Total Joint Exercises  Ankle Circles/Pumps AROM;Both;10 reps  Quad Sets AROM;Both;10 reps  Heel Slides AROM;AAROM;Left;10 reps  Hip ABduction/ADduction AROM;AAROM;Left;10 reps  Straight Leg Raises 10 reps;Both;AAROM  Goniometric ROM grosslt  10 to 55* AAROM knee flexion  PT - End of Session  Activity Tolerance Patient tolerated treatment well  Patient left in bed;with call bell/phone within reach;with bed alarm set  Nurse Communication Mobility status  PT - Assessment/Plan  PT Plan Current plan remains appropriate  PT Visit Diagnosis Difficulty in walking, not elsewhere classified (R26.2)  PT Frequency (ACUTE ONLY) 7X/week  Follow Up Recommendations Outpatient PT  PT equipment Rolling walker with 5" wheels  AM-PAC PT "6 Clicks" Daily Activity Outcome Measure  Difficulty turning over in bed (including adjusting bedclothes, sheets and blankets)? 3  Difficulty moving from lying on back to sitting on the side of the  bed?  3  Difficulty sitting down on and standing up from a chair with arms (e.g., wheelchair, bedside commode, etc,.)? 3  Help needed moving to and from a bed to chair (including a wheelchair)? 3  Help needed walking in hospital room? 3  Help needed climbing 3-5 steps with a railing?  3  6 Click Score 18  Mobility G Code  CK  PT Goal Progression  Progress towards PT goals Progressing toward goals  Acute Rehab PT Goals  PT Goal Formulation With patient  Time For Goal Achievement 03/01/17  Potential to Achieve Goals Good  PT Time Calculation  PT Start Time (ACUTE ONLY) 1413  PT Stop Time (ACUTE ONLY) 1426  PT Time Calculation (min) (ACUTE ONLY) 13 min  PT General Charges  $$ ACUTE PT VISIT 1 Procedure  PT Treatments  $Therapeutic Exercise 8-22 mins

## 2017-02-22 NOTE — Evaluation (Signed)
Physical Therapy Evaluation Patient Details Name: Jade Hernandez MRN: [id number] DOB: 27-Mar-1941 Today's Date: 02/22/2017   History of Present Illness  s/p L TKA   Clinical Impression  Pt is s/p TKA resulting in the deficits listed below (see PT Problem List).  Pt will benefit from skilled PT to increase their independence and safety with mobility to allow discharge to the venue listed below.      Follow Up Recommendations Outpatient PT    Equipment Recommendations       Recommendations for Other Services       Precautions / Restrictions Precautions Precautions: Fall;Knee Precaution Comments: Educated on knee precautions  Required Braces or Orthoses: Knee Immobilizer - Left Knee Immobilizer - Left: Discontinue once straight leg raise with < 10 degree lag Restrictions Weight Bearing Restrictions: No LLE Weight Bearing: Weight bearing as tolerated      Mobility  Bed Mobility Overal bed mobility: Needs Assistance Bed Mobility: Supine to Sit     Supine to sit: Min assist     General bed mobility comments: OOB with OT  Transfers Overall transfer level: Needs assistance Equipment used: Rolling walker (2 wheeled) Transfers: Sit to/from Stand Sit to Stand: Min guard         General transfer comment: verbal cues for hand/LE placement with RW  Ambulation/Gait Ambulation/Gait assistance: Min guard Ambulation Distance (Feet): 60 Feet Assistive device: Rolling walker (2 wheeled) Gait Pattern/deviations: Step-to pattern;Decreased weight shift to left     General Gait Details: cues fro sequence and technique  Stairs            Wheelchair Mobility    Modified Rankin (Stroke Patients Only)       Balance Overall balance assessment: Needs assistance Sitting-balance support: Feet supported Sitting balance-Leahy Scale: Fair     Standing balance support: Bilateral upper extremity supported Standing balance-Leahy Scale: Fair                                Pertinent Vitals/Pain Pain Assessment: 0-10 Pain Score: 3  Faces Pain Scale: Hurts a little bit Pain Location: L knee Pain Descriptors / Indicators: Sore Pain Intervention(s): Limited activity within patient's tolerance;Monitored during session;Premedicated before session    Home Living Family/patient expects to be discharged to:: Private residence Living Arrangements: Spouse/significant other Available Help at Discharge: Family Type of Home: House Home Access: Stairs to enter   Technical brewer of Steps: 3 Home Layout: Multi-level;Able to live on main level with bedroom/bathroom Home Equipment: Bedside commode;Tub bench;Shower seat;Walker - 2 wheels Additional Comments: Educated on using tub bench vs shower seat to increase safety during shower transfer     Prior Function Level of Independence: Independent               Hand Dominance        Extremity/Trunk Assessment   Upper Extremity Assessment Upper Extremity Assessment: Overall WFL for tasks assessed;Defer to OT evaluation    Lower Extremity Assessment Lower Extremity Assessment: LLE deficits/detail LLE Deficits / Details: ankle WFL, knee extension and hip flexion 2-/5, anticipated post op pain and weakness       Communication   Communication: No difficulties  Cognition Arousal/Alertness: Awake/alert Behavior During Therapy: WFL for tasks assessed/performed Overall Cognitive Status: Within Functional Limits for tasks assessed  General Comments      Exercises     Assessment/Plan    PT Assessment Patient needs continued PT services  PT Problem List Decreased strength;Decreased range of motion;Decreased activity tolerance;Decreased knowledge of use of DME;Decreased mobility;Decreased safety awareness;Pain       PT Treatment Interventions DME instruction;Gait training;Functional mobility training;Therapeutic  activities;Therapeutic exercise;Stair training;Patient/family education    PT Goals (Current goals can be found in the Care Plan section)  Acute Rehab PT Goals Patient Stated Goal: To be more independent  PT Goal Formulation: With patient Time For Goal Achievement: 03/01/17 Potential to Achieve Goals: Good    Frequency 7X/week   Barriers to discharge        Co-evaluation               End of Session Equipment Utilized During Treatment: Gait belt;Left knee immobilizer Activity Tolerance: Patient tolerated treatment well Patient left: in chair;with call bell/phone within reach;with family/visitor present;with chair alarm set Nurse Communication: Mobility status PT Visit Diagnosis: Difficulty in walking, not elsewhere classified (R26.2)    Time: 1561-5379 PT Time Calculation (min) (ACUTE ONLY): 18 min   Charges:   PT Evaluation $PT Eval Low Complexity: 1 Procedure     PT G CodesKenyon Ana, PT Pager: [phone] 02/22/2017   [facility] 02/22/2017, 1:27 PM

## 2017-02-22 NOTE — Progress Notes (Addendum)
Home with spouse. Lives in a 3 story home with 3 steps. No DME needs. Has a RW. Scheduled to start OP PT at [facility] on 02-25-17 home with OPPT. [phone]

## 2017-02-23 LAB — CBC
HCT: 35.7 % — ABNORMAL LOW (ref 36.0–46.0)
Hemoglobin: 11.7 g/dL — ABNORMAL LOW (ref 12.0–15.0)
MCH: 27.5 pg (ref 26.0–34.0)
MCHC: 32.8 g/dL (ref 30.0–36.0)
MCV: 84 fL (ref 78.0–100.0)
Platelets: 180 10*3/uL (ref 150–400)
RBC: 4.25 MIL/uL (ref 3.87–5.11)
RDW: 14.6 % (ref 11.5–15.5)
WBC: 9.1 10*3/uL (ref 4.0–10.5)

## 2017-02-23 LAB — BASIC METABOLIC PANEL
Anion gap: 8 (ref 5–15)
BUN: 8 mg/dL (ref 6–20)
CO2: 29 mmol/L (ref 22–32)
Calcium: 8.9 mg/dL (ref 8.9–10.3)
Chloride: 102 mmol/L (ref 101–111)
Creatinine, Ser: 0.53 mg/dL (ref 0.44–1.00)
GFR calc Af Amer: >60 mL/min (ref >60)
GFR calc non Af Amer: >60 mL/min (ref >60)
Glucose, Bld: 107 mg/dL — ABNORMAL HIGH (ref 65–99)
Potassium: 3.3 mmol/L — ABNORMAL LOW (ref 3.5–5.1)
Sodium: 139 mmol/L (ref 135–145)

## 2017-02-23 MED ORDER — POTASSIUM CHLORIDE CRYS ER 20 MEQ PO TBCR
40.0000 meq | EXTENDED_RELEASE_TABLET | Freq: Every day | ORAL | Status: DC
  Administered 2017-02-23: 40 meq via ORAL
  Filled 2017-02-23: qty 2

## 2017-02-23 NOTE — Progress Notes (Signed)
02/23/17 1500  PT Visit Information  Last PT Received On 02/23/17  Assistance Needed +1  History of Present Illness s/p L TKA   Subjective Data  Patient Stated Goal To be more independent   Precautions  Precautions Fall;Knee  Precaution Comments reviewed knee precautions and donning/doffing KI  Required Braces or Orthoses Knee Immobilizer - Left  Knee Immobilizer - Left Discontinue once straight leg raise with < 10 degree lag  Restrictions  LLE Weight Bearing WBAT  Pain Assessment  Pain Assessment 0-10  Pain Score 3  Pain Location L knee  Pain Descriptors / Indicators Sore  Pain Intervention(s) Limited activity within patient's tolerance;Monitored during session  Cognition  Arousal/Alertness Awake/alert  Behavior During Therapy WFL for tasks assessed/performed  Overall Cognitive Status Within Functional Limits for tasks assessed  Bed Mobility  Overal bed mobility Needs Assistance  Bed Mobility Supine to Sit  Supine to sit Min assist  General bed mobility comments light assist with LLE  Transfers  Overall transfer level Needs assistance  Equipment used Rolling walker (2 wheeled)  Transfers Sit to/from Stand  Sit to Stand Min guard;Supervision  General transfer comment cues for hand placement and LLE position  Ambulation/Gait  Ambulation/Gait assistance Min guard;Supervision  Ambulation Distance (Feet) 50 Feet  Assistive device Rolling walker (2 wheeled)  Gait Pattern/deviations Step-to pattern;Decreased weight shift to left  General Gait Details cues for sequence, posture and technique  Stairs assistance Min assist;Min guard  Stair Management One rail Right;With walker;Step to pattern;Forwards  Number of Stairs 4  General stair comments cues for sequence and technique; family present and able to return demo  Total Joint Exercises  Heel Slides AROM;AAROM;Left;10 reps  Hip ABduction/ADduction AROM;AAROM;Left;10 reps  Straight Leg Raises Other (comment) (pain incr>7/10,  further ex's deferred, encouraged to dolater)  PT - End of Session  Equipment Utilized During Treatment Gait belt;Left knee immobilizer  Activity Tolerance Patient tolerated treatment well  Patient left in chair;with call bell/phone within reach;with family/visitor present;with chair alarm set  Nurse Communication Mobility status  PT - Assessment/Plan  PT Plan Current plan remains appropriate  PT Visit Diagnosis Difficulty in walking, not elsewhere classified (R26.2)  PT Frequency (ACUTE ONLY) 7X/week  Follow Up Recommendations Outpatient PT  PT equipment Rolling walker with 5" wheels  AM-PAC PT "6 Clicks" Daily Activity Outcome Measure  Difficulty turning over in bed (including adjusting bedclothes, sheets and blankets)? 3  Difficulty moving from lying on back to sitting on the side of the bed?  3  Difficulty sitting down on and standing up from a chair with arms (e.g., wheelchair, bedside commode, etc,.)? 3  Help needed moving to and from a bed to chair (including a wheelchair)? 3  Help needed walking in hospital room? 3  Help needed climbing 3-5 steps with a railing?  3  6 Click Score 18  Mobility G Code  CK  PT Goal Progression  Progress towards PT goals Progressing toward goals  Acute Rehab PT Goals  PT Goal Formulation With patient  Time For Goal Achievement 03/01/17  Potential to Achieve Goals Good  PT Time Calculation  PT Start Time (ACUTE ONLY) 1400  PT Stop Time (ACUTE ONLY) 1436  PT Time Calculation (min) (ACUTE ONLY) 36 min  PT General Charges  $$ ACUTE PT VISIT 1 Procedure  PT Treatments  $Gait Training 23-37 mins

## 2017-02-23 NOTE — Progress Notes (Signed)
Physical Therapy Treatment Patient Details Name: Jade Hernandez MRN: [id number] DOB: 23-Jun-1941 Today's Date: 02/23/2017    History of Present Illness s/p L TKA     PT Comments    Progressing well, will see again for second session to review stairs if needed and  HEP  Follow Up Recommendations  Outpatient PT     Equipment Recommendations  Rolling walker with 5" wheels    Recommendations for Other Services       Precautions / Restrictions Precautions Precautions: Fall;Knee Required Braces or Orthoses: Knee Immobilizer - Left Knee Immobilizer - Left: Discontinue once straight leg raise with < 10 degree lag Restrictions LLE Weight Bearing: Weight bearing as tolerated    Mobility  Bed Mobility Overal bed mobility: Needs Assistance Bed Mobility: Supine to Sit;Sit to Supine     Supine to sit: Min assist Sit to supine: Min assist   General bed mobility comments: light assist with LLE  Transfers Overall transfer level: Needs assistance Equipment used: Rolling walker (2 wheeled) Transfers: Sit to/from Stand Sit to Stand: Min guard;Supervision         General transfer comment: cues for hand placement and LLE position  Ambulation/Gait Ambulation/Gait assistance: Min guard;Supervision Ambulation Distance (Feet): 80 Feet Assistive device: Rolling walker (2 wheeled) Gait Pattern/deviations: Step-to pattern;Decreased weight shift to left     General Gait Details: cues for sequence and technique   Stairs Stairs: Yes   Stair Management: One rail Right;With walker;Step to pattern;Forwards Number of Stairs: 3 General stair comments: cues for sequence and technique  Wheelchair Mobility    Modified Rankin (Stroke Patients Only)       Balance                                            Cognition Arousal/Alertness: Awake/alert Behavior During Therapy: WFL for tasks assessed/performed Overall Cognitive Status: Within Functional Limits for  tasks assessed                                        Exercises      General Comments        Pertinent Vitals/Pain Pain Assessment: 0-10 Pain Score: 4  Pain Location: L knee Pain Descriptors / Indicators: Sore Pain Intervention(s): Limited activity within patient's tolerance;Monitored during session    Home Living                      Prior Function            PT Goals (current goals can now be found in the care plan section) Acute Rehab PT Goals Patient Stated Goal: To be more independent  PT Goal Formulation: With patient Time For Goal Achievement: 03/01/17 Potential to Achieve Goals: Good Progress towards PT goals: Progressing toward goals    Frequency    7X/week      PT Plan Current plan remains appropriate    Co-evaluation             End of Session Equipment Utilized During Treatment: Gait belt;Left knee immobilizer Activity Tolerance: Patient tolerated treatment well Patient left: in bed;with call bell/phone within reach;with bed alarm set   PT Visit Diagnosis: Difficulty in walking, not elsewhere classified (R26.2)     Time: 4098-1191 PT Time Calculation (min) (ACUTE  ONLY): 30 min  Charges:  $Gait Training: 23-37 mins                    G CodesKenyon Ana, PT Pager: [phone] 02/23/2017    [facility] 02/23/2017, 1:34 PM

## 2017-02-23 NOTE — Progress Notes (Signed)
Asked to see patient about getting a BSC. When talking with the patient, son and spouse at bedside, they relayed concerns about help at home. Patient lives at home with spouse and has done well with PT, no PT recommendations. Son had private duty list with him during discussion. Spoke with him about the different levels of care (skilled vs custodial), son states he was comfortable with OP PT as arranged but still had care concerns. Encouraged him to utilize private duty list if needed. He asked to discuss concerns with attending. Contacted Leonides Grills since this is a bundle patient to see if someone could better address his concerns.

## 2017-02-24 LAB — CBC
HCT: 32 % — ABNORMAL LOW (ref 36.0–46.0)
Hemoglobin: 10.5 g/dL — ABNORMAL LOW (ref 12.0–15.0)
MCH: 27.4 pg (ref 26.0–34.0)
MCHC: 32.8 g/dL (ref 30.0–36.0)
MCV: 83.6 fL (ref 78.0–100.0)
Platelets: 165 10*3/uL (ref 150–400)
RBC: 3.83 MIL/uL — ABNORMAL LOW (ref 3.87–5.11)
RDW: 14.9 % (ref 11.5–15.5)
WBC: 7.5 10*3/uL (ref 4.0–10.5)

## 2017-02-24 MED ORDER — PNEUMOCOCCAL VAC POLYVALENT 25 MCG/0.5ML IJ INJ: 0.5000 mL | INJECTION | INTRAMUSCULAR | Status: DC

## 2017-02-24 NOTE — Progress Notes (Addendum)
   Subjective: 3 Days Post-Op Procedure(s) (LRB): LEFT TOTAL KNEE ARTHROPLASTY (Left) Patient reports pain as mild.   Patient seen in rounds by Dr. Wynelle Link. Patient is well, but has had some minor complaints of pain in the knee, requiring pain medications Patient is ready to go home if she meets all her goals with therapy.  Plan on two sessions of therapy today and then home this afternoon if ready.  Objective: Vital signs in last 24 hours: Temp:  [98.1 F (36.7 C)-98.7 F (37.1 C)] 98.1 F (36.7 C) (04/12 0515) Pulse Rate:  [76-86] 79 (04/12 0515) Resp:  [16-18] 18 (04/12 0515) BP: (121-136)/(54-60) 124/56 (04/12 0515) SpO2:  [92 %-96 %] 94 % (04/12 0515)  Intake/Output from previous day:  Intake/Output Summary (Last 24 hours) at 02/24/17 0841 Last data filed at 02/24/17 0515  Gross per 24 hour  Intake              420 ml  Output                0 ml  Net              420 ml    Intake/Output this shift: No intake/output data recorded.  Labs:  Recent Labs  02/22/17 0435 02/23/17 0423 02/24/17 0445  HGB 10.4* 11.7* 10.5*    Recent Labs  02/23/17 0423 02/24/17 0445  WBC 9.1 7.5  RBC 4.25 3.83*  HCT 35.7* 32.0*  PLT 180 165    Recent Labs  02/22/17 0435 02/23/17 0423  NA 138 139  K 3.6 3.3*  CL 109 102  CO2 24 29  BUN 11 8  CREATININE 0.47 0.53  GLUCOSE 145* 107*  CALCIUM 8.3* 8.9   No results for input(s): LABPT, INR in the last 72 hours.  EXAM: General - Patient is Alert, Appropriate and Oriented Extremity - Neurovascular intact Sensation intact distally Intact pulses distally Dorsiflexion/Plantar flexion intact Incision - clean, dry, healing Motor Function - intact, moving foot and toes well on exam.   Assessment/Plan: 3 Days Post-Op Procedure(s) (LRB): LEFT TOTAL KNEE ARTHROPLASTY (Left) Procedure(s) (LRB): LEFT TOTAL KNEE ARTHROPLASTY (Left) Past Medical History:  Diagnosis Date  . Arthritis   . Diverticulosis   . High cholesterol    . Hypertension   . Palpitations   . UTI (urinary tract infection)    Principal Problem:   Osteoarthritis of knee Active Problems:   OA (osteoarthritis) of knee  Estimated body mass index is 32.24 kg/m as calculated from the following:   Height as of this encounter: 5\' 3"  (1.6 m).   Weight as of this encounter: 82.6 kg (182 lb). Up with therapy  DC home, straight to outpatient therapy Friday. Diet - Cardiac diet Follow up - in 2 weeks Activity - WBAT Disposition - Home Condition Upon Discharge - pending D/C Meds - See DC Summary DVT Prophylaxis - Xarelto  Arlee Muslim, PA-C Orthopaedic Surgery 02/24/2017, 8:41 AM

## 2017-02-24 NOTE — Progress Notes (Signed)
Physical Therapy Treatment Patient Details Name: Jade Hernandez MRN: [id number] DOB: May 16, 1941 Today's Date: 02/24/2017    History of Present Illness s/p L TKA     PT Comments    POD # 3 pm session Son present but only for today to get mom home as he lives in [place].  Assisted with amb pt in hallway.  Assisted with stairs with spouse however he is physically unable to safely assist pt up/down the 4 steps to get in/out house for her OP PT session.  Pt still present with impaired safety cognition (meds) and too unsteady on stairs.  HIGH FALL RISK. Son is here today to assist pt into house but he lives in [place] and will not be available to take pt to her OP appts.  Consulted  with LPT pt will need HH vs OP   Follow Up Recommendations     HH PT   Equipment Recommendations     RW, 3:1  Recommendations for Other Services       Precautions / Restrictions Precautions Precautions: Fall;Knee Precaution Comments: instructed on KI use for amb and stairs Required Braces or Orthoses: Knee Immobilizer - Left Knee Immobilizer - Left: Discontinue once straight leg raise with < 10 degree lag Restrictions Weight Bearing Restrictions: No LLE Weight Bearing: Weight bearing as tolerated    Mobility  Bed Mobility Overal bed mobility: Needs Assistance Bed Mobility: Supine to Sit     Supine to sit: Min assist     General bed mobility comments: had son "hands on" assist pt OOB supporting L LE  Transfers Overall transfer level: Needs assistance Equipment used: Rolling walker (2 wheeled) Transfers: Sit to/from [facility] Sit to Stand: Min guard;Supervision Stand pivot transfers: Min guard;Supervision       General transfer comment: 50% VC's on proper hand placement and to L LE placement prior to sit/stand  Ambulation/Gait Ambulation/Gait assistance: Min guard;Supervision Ambulation Distance (Feet): 42 Feet Assistive device: Rolling walker (2 wheeled) Gait  Pattern/deviations: Step-to pattern;Decreased stance time - left Gait velocity: decreased   General Gait Details: 25% VC's on proper walker to self distance and safety with turns.  Pt unsteady.     Stairs Stairs: Yes   Stair Management: One rail Right;Step to pattern;Forwards;With crutches Number of Stairs: 4 General stair comments: attempted stairs with spouse use one rail and one crutch.  Spouse physical unable to safely assist pt up/down stairs.  Both unsteady.  Both fall Risk.  Pt near fall therapist recovered.  Son also present but he lives in [place] and will not be taking pt to her OP appointments.  Wheelchair Mobility    Modified Rankin (Stroke Patients Only)       Balance                                            Cognition Arousal/Alertness: Awake/alert Behavior During Therapy: WFL for tasks assessed/performed Overall Cognitive Status: Within Functional Limits for tasks assessed                                        Exercises      General Comments        Pertinent Vitals/Pain Pain Assessment: 0-10 Pain Score: 3  Pain Location: L knee Pain Descriptors / Indicators: Operative  site guarding;Sore;Tender Pain Intervention(s): Monitored during session;Repositioned;Ice applied    Home Living                      Prior Function            PT Goals (current goals can now be found in the care plan section) Progress towards PT goals: Progressing toward goals    Frequency           PT Plan      Co-evaluation             End of Session Equipment Utilized During Treatment: Gait belt;Left knee immobilizer Activity Tolerance: Patient limited by fatigue Patient left: in chair;with call bell/phone within reach;with family/visitor present Nurse Communication:  (spouse Physically unable to assist pt safely up in/out of house for her OP appts.  Son here only for a day as he lives in [place].  He can only help today to  get pt home.)       Time: 1350-1430 PT Time Calculation (min) (ACUTE ONLY): 40 min  Charges:  $Gait Training: 23-37 mins $Therapeutic Activity: 8-22 mins                    G Codes:       Rica Koyanagi  PTA [facility]  Acute  Rehab Pager      [phone]

## 2017-02-24 NOTE — Progress Notes (Signed)
Physical Therapy Treatment Patient Details Name: Jade Hernandez MRN: [id number] DOB: 1941-04-12 Today's Date: 02/24/2017    History of Present Illness s/p L TKA     PT Comments    POD # 3 am session Applied KI and assisted OOB to bathroom then amb in hallway.  No family present, so will need another PT session with family .  Follow Up Recommendations        Equipment Recommendations  Rolling walker with 5" wheels    Recommendations for Other Services       Precautions / Restrictions Precautions Precautions: Fall;Knee Precaution Comments: instructed on KI use for amb and stairs Required Braces or Orthoses: Knee Immobilizer - Left Knee Immobilizer - Left: Discontinue once straight leg raise with < 10 degree lag Restrictions Weight Bearing Restrictions: No LLE Weight Bearing: Weight bearing as tolerated    Mobility  Bed Mobility Overal bed mobility: Needs Assistance Bed Mobility: Supine to Sit     Supine to sit: Min assist     General bed mobility comments: assist for L LE and increased time  Transfers Overall transfer level: Needs assistance Equipment used: Rolling walker (2 wheeled) Transfers: Sit to/from [facility] Sit to Stand: Min guard;Min assist Stand pivot transfers: Min guard;Min assist       General transfer comment: 50% VC's on proper hand placement and to L LE placement prior to sit/stand   also assisted in bathroom  Ambulation/Gait Ambulation/Gait assistance: Min guard;Supervision Ambulation Distance (Feet): 27 Feet Assistive device: Rolling walker (2 wheeled) Gait Pattern/deviations: Step-to pattern;Decreased stance time - left Gait velocity: decreased   General Gait Details: 25% VC's on proper walker to self distance and safety with turns.  Pt unsteady.     Wheelchair Mobility    Modified Rankin (Stroke Patients Only)       Balance                                            Cognition  Arousal/Alertness: Awake/alert Behavior During Therapy: WFL for tasks assessed/performed Overall Cognitive Status: Within Functional Limits for tasks assessed                                        Exercises      General Comments        Pertinent Vitals/Pain Pain Assessment: 0-10 Pain Score: 4  Pain Location: L knee Pain Descriptors / Indicators: Operative site guarding;Sore;Tender Pain Intervention(s): Monitored during session;Repositioned;Ice applied    Home Living                      Prior Function            PT Goals (current goals can now be found in the care plan section) Progress towards PT goals: Progressing toward goals    Frequency           PT Plan Current plan remains appropriate    Co-evaluation             Time: 1020-1050 PT Time Calculation (min) (ACUTE ONLY): 30 min  Charges:  $Gait Training: 8-22 mins $Therapeutic Activity: 8-22 mins                    G Codes:       {  Rica Koyanagi  PTA [facility]  Acute  Rehab Pager      [phone]

## 2017-02-25 DIAGNOSIS — M1711 Unilateral primary osteoarthritis, right knee: Secondary | ICD-10-CM | POA: Diagnosis not present

## 2017-02-25 DIAGNOSIS — M19071 Primary osteoarthritis, right ankle and foot: Secondary | ICD-10-CM | POA: Diagnosis not present

## 2017-02-25 DIAGNOSIS — I10 Essential (primary) hypertension: Secondary | ICD-10-CM | POA: Diagnosis not present

## 2017-02-25 DIAGNOSIS — Z471 Aftercare following joint replacement surgery: Secondary | ICD-10-CM | POA: Diagnosis not present

## 2017-02-25 DIAGNOSIS — Z96652 Presence of left artificial knee joint: Secondary | ICD-10-CM | POA: Diagnosis not present

## 2017-02-25 NOTE — Progress Notes (Signed)
Received a call late 4/12 stating patient would need HH PT. Patient arranged preop for [facility] should [place] be needed. Contacted [facility] rep for referral, they accepted patient for HHPT. Patient ready for d/c home.

## 2017-02-28 DIAGNOSIS — I10 Essential (primary) hypertension: Secondary | ICD-10-CM | POA: Diagnosis not present

## 2017-02-28 DIAGNOSIS — M19071 Primary osteoarthritis, right ankle and foot: Secondary | ICD-10-CM | POA: Diagnosis not present

## 2017-02-28 DIAGNOSIS — Z471 Aftercare following joint replacement surgery: Secondary | ICD-10-CM | POA: Diagnosis not present

## 2017-02-28 DIAGNOSIS — M1711 Unilateral primary osteoarthritis, right knee: Secondary | ICD-10-CM | POA: Diagnosis not present

## 2017-02-28 DIAGNOSIS — Z96652 Presence of left artificial knee joint: Secondary | ICD-10-CM | POA: Diagnosis not present

## 2017-03-02 DIAGNOSIS — I10 Essential (primary) hypertension: Secondary | ICD-10-CM | POA: Diagnosis not present

## 2017-03-02 DIAGNOSIS — M19071 Primary osteoarthritis, right ankle and foot: Secondary | ICD-10-CM | POA: Diagnosis not present

## 2017-03-02 DIAGNOSIS — Z96652 Presence of left artificial knee joint: Secondary | ICD-10-CM | POA: Diagnosis not present

## 2017-03-02 DIAGNOSIS — M1711 Unilateral primary osteoarthritis, right knee: Secondary | ICD-10-CM | POA: Diagnosis not present

## 2017-03-02 DIAGNOSIS — Z471 Aftercare following joint replacement surgery: Secondary | ICD-10-CM | POA: Diagnosis not present

## 2017-03-04 DIAGNOSIS — M19071 Primary osteoarthritis, right ankle and foot: Secondary | ICD-10-CM | POA: Diagnosis not present

## 2017-03-04 DIAGNOSIS — I10 Essential (primary) hypertension: Secondary | ICD-10-CM | POA: Diagnosis not present

## 2017-03-04 DIAGNOSIS — Z96652 Presence of left artificial knee joint: Secondary | ICD-10-CM | POA: Diagnosis not present

## 2017-03-04 DIAGNOSIS — Z471 Aftercare following joint replacement surgery: Secondary | ICD-10-CM | POA: Diagnosis not present

## 2017-03-04 DIAGNOSIS — M1711 Unilateral primary osteoarthritis, right knee: Secondary | ICD-10-CM | POA: Diagnosis not present

## 2017-03-07 DIAGNOSIS — M19071 Primary osteoarthritis, right ankle and foot: Secondary | ICD-10-CM | POA: Diagnosis not present

## 2017-03-07 DIAGNOSIS — Z471 Aftercare following joint replacement surgery: Secondary | ICD-10-CM | POA: Diagnosis not present

## 2017-03-07 DIAGNOSIS — M1711 Unilateral primary osteoarthritis, right knee: Secondary | ICD-10-CM | POA: Diagnosis not present

## 2017-03-07 DIAGNOSIS — Z96652 Presence of left artificial knee joint: Secondary | ICD-10-CM | POA: Diagnosis not present

## 2017-03-07 DIAGNOSIS — I10 Essential (primary) hypertension: Secondary | ICD-10-CM | POA: Diagnosis not present

## 2017-03-08 DIAGNOSIS — Z96652 Presence of left artificial knee joint: Secondary | ICD-10-CM | POA: Diagnosis not present

## 2017-03-08 DIAGNOSIS — Z471 Aftercare following joint replacement surgery: Secondary | ICD-10-CM | POA: Diagnosis not present

## 2017-03-09 DIAGNOSIS — M1712 Unilateral primary osteoarthritis, left knee: Secondary | ICD-10-CM | POA: Diagnosis not present

## 2017-03-11 DIAGNOSIS — M1712 Unilateral primary osteoarthritis, left knee: Secondary | ICD-10-CM | POA: Diagnosis not present

## 2017-03-14 DIAGNOSIS — M1712 Unilateral primary osteoarthritis, left knee: Secondary | ICD-10-CM | POA: Diagnosis not present

## 2017-03-16 DIAGNOSIS — M1712 Unilateral primary osteoarthritis, left knee: Secondary | ICD-10-CM | POA: Diagnosis not present

## 2017-03-21 DIAGNOSIS — Z471 Aftercare following joint replacement surgery: Secondary | ICD-10-CM | POA: Diagnosis not present

## 2017-03-21 DIAGNOSIS — Z96652 Presence of left artificial knee joint: Secondary | ICD-10-CM | POA: Diagnosis not present

## 2017-03-21 DIAGNOSIS — M1712 Unilateral primary osteoarthritis, left knee: Secondary | ICD-10-CM | POA: Diagnosis not present

## 2017-03-23 DIAGNOSIS — M1712 Unilateral primary osteoarthritis, left knee: Secondary | ICD-10-CM | POA: Diagnosis not present

## 2017-03-25 DIAGNOSIS — M1712 Unilateral primary osteoarthritis, left knee: Secondary | ICD-10-CM | POA: Diagnosis not present

## 2017-03-29 DIAGNOSIS — Z471 Aftercare following joint replacement surgery: Secondary | ICD-10-CM | POA: Diagnosis not present

## 2017-03-29 DIAGNOSIS — Z96652 Presence of left artificial knee joint: Secondary | ICD-10-CM | POA: Diagnosis not present

## 2017-03-30 DIAGNOSIS — M1712 Unilateral primary osteoarthritis, left knee: Secondary | ICD-10-CM | POA: Diagnosis not present

## 2017-04-01 DIAGNOSIS — M1712 Unilateral primary osteoarthritis, left knee: Secondary | ICD-10-CM | POA: Diagnosis not present

## 2017-04-04 DIAGNOSIS — M1712 Unilateral primary osteoarthritis, left knee: Secondary | ICD-10-CM | POA: Diagnosis not present

## 2017-04-06 DIAGNOSIS — M1712 Unilateral primary osteoarthritis, left knee: Secondary | ICD-10-CM | POA: Diagnosis not present

## 2017-04-14 DIAGNOSIS — R300 Dysuria: Secondary | ICD-10-CM | POA: Diagnosis not present

## 2017-04-14 DIAGNOSIS — N3020 Other chronic cystitis without hematuria: Secondary | ICD-10-CM | POA: Diagnosis not present

## 2017-04-28 DIAGNOSIS — E782 Mixed hyperlipidemia: Secondary | ICD-10-CM | POA: Diagnosis not present

## 2017-04-28 DIAGNOSIS — I10 Essential (primary) hypertension: Secondary | ICD-10-CM | POA: Diagnosis not present

## 2017-04-28 DIAGNOSIS — M1990 Unspecified osteoarthritis, unspecified site: Secondary | ICD-10-CM | POA: Diagnosis not present

## 2017-04-28 DIAGNOSIS — L910 Hypertrophic scar: Secondary | ICD-10-CM | POA: Diagnosis not present

## 2017-04-28 DIAGNOSIS — E669 Obesity, unspecified: Secondary | ICD-10-CM | POA: Diagnosis not present

## 2017-05-03 DIAGNOSIS — Z471 Aftercare following joint replacement surgery: Secondary | ICD-10-CM | POA: Diagnosis not present

## 2017-05-03 DIAGNOSIS — Z96652 Presence of left artificial knee joint: Secondary | ICD-10-CM | POA: Diagnosis not present

## 2017-05-03 DIAGNOSIS — M1712 Unilateral primary osteoarthritis, left knee: Secondary | ICD-10-CM | POA: Diagnosis not present

## 2017-05-06 DIAGNOSIS — Z1231 Encounter for screening mammogram for malignant neoplasm of breast: Secondary | ICD-10-CM | POA: Diagnosis not present

## 2017-05-26 DIAGNOSIS — H25013 Cortical age-related cataract, bilateral: Secondary | ICD-10-CM | POA: Diagnosis not present

## 2017-06-01 DIAGNOSIS — N281 Cyst of kidney, acquired: Secondary | ICD-10-CM | POA: Diagnosis not present

## 2017-06-01 DIAGNOSIS — R350 Frequency of micturition: Secondary | ICD-10-CM | POA: Diagnosis not present

## 2017-06-01 DIAGNOSIS — H6122 Impacted cerumen, left ear: Secondary | ICD-10-CM | POA: Diagnosis not present

## 2017-07-15 DIAGNOSIS — R197 Diarrhea, unspecified: Secondary | ICD-10-CM | POA: Diagnosis not present

## 2017-07-27 DIAGNOSIS — R197 Diarrhea, unspecified: Secondary | ICD-10-CM | POA: Diagnosis not present

## 2017-08-09 ENCOUNTER — Other Ambulatory Visit: Payer: Self-pay | Admitting: Cardiovascular Disease

## 2017-08-23 DIAGNOSIS — Z23 Encounter for immunization: Secondary | ICD-10-CM | POA: Diagnosis not present

## 2017-08-23 DIAGNOSIS — K589 Irritable bowel syndrome without diarrhea: Secondary | ICD-10-CM | POA: Diagnosis not present

## 2017-09-21 ENCOUNTER — Encounter: Payer: Self-pay | Admitting: Nurse Practitioner

## 2017-09-21 ENCOUNTER — Encounter (INDEPENDENT_AMBULATORY_CARE_PROVIDER_SITE_OTHER): Payer: Self-pay

## 2017-09-21 ENCOUNTER — Ambulatory Visit (INDEPENDENT_AMBULATORY_CARE_PROVIDER_SITE_OTHER): Payer: Medicare Other | Admitting: Nurse Practitioner

## 2017-09-21 VITALS — BP 138/78 | HR 80 | Ht 63.0 in | Wt 176.4 lb

## 2017-09-21 DIAGNOSIS — R197 Diarrhea, unspecified: Secondary | ICD-10-CM | POA: Diagnosis not present

## 2017-09-21 NOTE — Progress Notes (Addendum)
HPI: Patient is a 76 year old female previously followed by Dr. Deatra Ina. She has a hx of diverticulosis / diverticular hemorrhage in 2013, chronic constipation and adenomatous colon polyps. Three weeks ago she had 1-2 days of non-bloody diarrhea without cramps or fevers. She took a couple of doses of Imodium and diarrhea resolved. Stools promptly returned to normal. Appetite has been fine and weight has been stable. Occcasionally she gets constipated with "thicker" stools. She hasn't had any rectal bleeding. No sick contacts. She had not had any recent antibiotics. No out of county travel. No recent medication changes. She was seen at PCP's office around the time diarrhea started. I don't have results but per patient her labs and stool studies were normal.     Past Medical History:  Diagnosis Date  . Arthritis   . Diverticulosis   . High cholesterol   . Hypertension   . Palpitations   . UTI (urinary tract infection)      Past Surgical History:  Procedure Laterality Date  . BILATERAL CARPAL TUNNEL RELEASE    . COLONOSCOPY    . NM MYOCAR PERF WALL MOTION  10/02/2007   No significant ischemia demonstrated  . US ECHOCARDIOGRAPHY  10/29/2010   Proximal septal thickening noted, EF =>55%,LA mildly dilated,mild mitral annular ca+, AOV mildly sclerotic, mild AI.   Family History  Problem Relation Age of Onset  . Hypertension Mother   . Diabetes Mother   . Heart disease Father        died from heart attack  . Heart disease Brother   . Colon cancer Neg Hx   . Rectal cancer Neg Hx   . Liver cancer Neg Hx   . Stomach cancer Neg Hx    Social History   Tobacco Use  . Smoking status: Never Smoker  . Smokeless tobacco: Never Used  Substance Use Topics  . Alcohol use: Yes    Alcohol/week: 0.6 oz    Types: 1 Glasses of wine per week    Comment: occasional  . Drug use: No   Current Outpatient Medications  Medication Sig Dispense Refill  . acetaminophen (TYLENOL) 325 MG tablet  Take 325-487.5 mg by mouth every 6 (six) hours as needed (for knee pain.).    Marland Kitchen hydrochlorothiazide (HYDRODIURIL) 25 MG tablet Take 25 mg by mouth daily.    . metoprolol succinate (TOPROL-XL) 50 MG 24 hr tablet Take 2 tablets (100 mg total) by mouth daily. Please call to schedule an appointment thanks. (attempt 1) (Patient taking differently: Take 50 mg daily by mouth. Please call to schedule an appointment thanks. (attempt 1)) 180 tablet 0  . simvastatin (ZOCOR) 40 MG tablet Take 40 mg by mouth daily.      No current facility-administered medications for this visit.    No Known Allergies   Review of Systems: All systems reviewed and negative except where noted in HPI.    Physical Exam: BP 138/78   Pulse 80   Ht 5\' 3"  (1.6 m)   Wt 176 lb 6 oz (80 kg)   BMI 31.24 kg/m  Constitutional:  Well-developed female in no acute distress. Psychiatric: Normal mood and affect. Behavior is normal. EENT: Pupils normal.  Conjunctivae are normal. No scleral icterus. Neck supple.  Cardiovascular: Normal rate, regular rhythm. No edema Pulmonary/chest: Effort normal and breath sounds normal. No wheezing, rales or rhonchi. Abdominal: Soft, nondistended. Nontender. Bowel sounds active throughout. There are no masses palpable. No hepatomegaly. Lymphadenopathy: No cervical adenopathy noted. Neurological:  Alert and oriented to person place and time. Skin: Skin is warm and dry. No rashes noted.   ASSESSMENT AND PLAN:  76 year old female with two days worth of loose stool a few weeks back. No associated abdominal pain , nausea or fevers. BMs back to baseline which consists of occasional constipation but otherwise normal stools. . She and husband had questions about whether patient should avoid certain foods going forward. Since patient cannot correlate the illness with anything she ate, or even with food in general then there is anything in particular to avoid. She looks fine. Abdominal exam is unremarkable.  Suspect resolved infectious process. Patient will call is diarrhea recurs. She is due for colon cancer screening in 2020  Tye Savoy, NP  09/21/2017, 11:59 AM  Agree with Ms. Vanita Ingles assessment and plan. Gatha Mayer, MD, [facility]

## 2017-09-21 NOTE — Patient Instructions (Signed)
If you are age 74 or older, your body mass index should be between 23-30. Your Body mass index is 31.24 kg/m. If this is out of the aforementioned range listed, please consider follow up with your Primary Care Provider.  If you are age 33 or younger, your body mass index should be between 19-25. Your Body mass index is 31.24 kg/m. If this is out of the aformentioned range listed, please consider follow up with your Primary Care Provider.   Follow up as needed.  Thank you for choosing me and [facility].   Tye Savoy, NP

## 2017-11-01 DIAGNOSIS — E782 Mixed hyperlipidemia: Secondary | ICD-10-CM | POA: Diagnosis not present

## 2017-11-01 DIAGNOSIS — M1990 Unspecified osteoarthritis, unspecified site: Secondary | ICD-10-CM | POA: Diagnosis not present

## 2017-11-01 DIAGNOSIS — D72819 Decreased white blood cell count, unspecified: Secondary | ICD-10-CM | POA: Diagnosis not present

## 2017-11-01 DIAGNOSIS — E669 Obesity, unspecified: Secondary | ICD-10-CM | POA: Diagnosis not present

## 2017-11-01 DIAGNOSIS — K219 Gastro-esophageal reflux disease without esophagitis: Secondary | ICD-10-CM | POA: Diagnosis not present

## 2017-11-01 DIAGNOSIS — M899 Disorder of bone, unspecified: Secondary | ICD-10-CM | POA: Diagnosis not present

## 2017-11-01 DIAGNOSIS — Z0000 Encounter for general adult medical examination without abnormal findings: Secondary | ICD-10-CM | POA: Diagnosis not present

## 2017-11-01 DIAGNOSIS — I10 Essential (primary) hypertension: Secondary | ICD-10-CM | POA: Diagnosis not present

## 2017-11-01 DIAGNOSIS — K589 Irritable bowel syndrome without diarrhea: Secondary | ICD-10-CM | POA: Diagnosis not present

## 2017-11-01 DIAGNOSIS — Z6832 Body mass index (BMI) 32.0-32.9, adult: Secondary | ICD-10-CM | POA: Diagnosis not present

## 2017-11-02 DIAGNOSIS — H52203 Unspecified astigmatism, bilateral: Secondary | ICD-10-CM | POA: Diagnosis not present

## 2017-11-02 DIAGNOSIS — H25813 Combined forms of age-related cataract, bilateral: Secondary | ICD-10-CM | POA: Diagnosis not present

## 2017-11-02 DIAGNOSIS — D3131 Benign neoplasm of right choroid: Secondary | ICD-10-CM | POA: Diagnosis not present

## 2017-11-17 DIAGNOSIS — M1711 Unilateral primary osteoarthritis, right knee: Secondary | ICD-10-CM | POA: Diagnosis not present

## 2018-01-16 ENCOUNTER — Other Ambulatory Visit: Payer: Self-pay | Admitting: Cardiovascular Disease

## 2018-01-16 DIAGNOSIS — L292 Pruritus vulvae: Secondary | ICD-10-CM | POA: Diagnosis not present

## 2018-01-16 DIAGNOSIS — Z6832 Body mass index (BMI) 32.0-32.9, adult: Secondary | ICD-10-CM | POA: Diagnosis not present

## 2018-01-16 DIAGNOSIS — Z01419 Encounter for gynecological examination (general) (routine) without abnormal findings: Secondary | ICD-10-CM | POA: Diagnosis not present

## 2018-02-13 ENCOUNTER — Telehealth: Payer: Self-pay | Admitting: Nurse Practitioner

## 2018-02-13 DIAGNOSIS — R300 Dysuria: Secondary | ICD-10-CM | POA: Diagnosis not present

## 2018-02-13 NOTE — Telephone Encounter (Signed)
Spoke with the patient and her husband. She has spells of loose stools, normal stools and constipated stools. She does not use any over the counter supplements for her symptoms. She does know of dietary triggers. She wants to know what she should do. She is traveling in 3 days and will be flying. She is going to [place].  Discussed recognizing her dietary triggers. Avoiding dehydration. Important to maintain her routine bowel schedule to avoid "holding it." She seems to understand this plan. She is self diagnosing as IBS. She does not have more than 2 stools in the day. She does not skip a day of moving her bowels.

## 2018-02-15 DIAGNOSIS — R197 Diarrhea, unspecified: Secondary | ICD-10-CM | POA: Diagnosis not present

## 2018-02-21 DIAGNOSIS — R197 Diarrhea, unspecified: Secondary | ICD-10-CM | POA: Diagnosis not present

## 2018-02-21 DIAGNOSIS — K591 Functional diarrhea: Secondary | ICD-10-CM | POA: Diagnosis not present

## 2018-02-24 ENCOUNTER — Encounter (INDEPENDENT_AMBULATORY_CARE_PROVIDER_SITE_OTHER): Payer: Self-pay

## 2018-02-24 ENCOUNTER — Encounter: Payer: Self-pay | Admitting: Internal Medicine

## 2018-02-24 ENCOUNTER — Ambulatory Visit (INDEPENDENT_AMBULATORY_CARE_PROVIDER_SITE_OTHER): Payer: Medicare Other | Admitting: Internal Medicine

## 2018-02-24 VITALS — BP 120/74 | HR 82 | Ht 63.5 in | Wt 176.4 lb

## 2018-02-24 DIAGNOSIS — K580 Irritable bowel syndrome with diarrhea: Secondary | ICD-10-CM

## 2018-02-24 DIAGNOSIS — Z86010 Personal history of colonic polyps: Secondary | ICD-10-CM

## 2018-02-24 NOTE — Patient Instructions (Addendum)
  Try taking 1/2 tablet of Imodium per Dr Carlean Purl.   Take your probiotic.   We are giving you an IBS handout to read.   We would like to see you in June.  Appointment made for 04/18/18 at 10:30AM    I appreciate the opportunity to care for you. Silvano Rusk, MD, [facility]

## 2018-02-24 NOTE — Progress Notes (Signed)
Jade Hernandez 77 y.o. 1941-03-30 [id number]  Assessment & Plan:   Encounter Diagnoses  Name Primary?  . Irritable bowel syndrome with diarrhea Yes  . Hx of adenomatous polyp of colon     I think she has diarrhea predominant irritable bowel syndrome and constipation occurs after taking Imodium as best I can tell from talking to her.  It sporadic and the more we talked it sounds like it is chronic.  Loperamide try 1/2 tab at a time as needed and perhaps she might have less of the.  Of constipation after using that Probiotic supplementation is reasonable for the time being IBS handout from up-to-date is provided to the patient today Other considerations might be using IB Gard depending upon clinical course A daily medication seems unlikely to be necessary given she has periods of normalcy Fiber supplementation could be considered as well though might promote looser stools  We will see how she is doing at follow-up in a few months  She will return in a few months for follow-up.  She is advised to call back sooner as needed.  We did discuss having a colonoscopy before her routine follow-up.  She prefers not to but will if she has persistent symptoms or something changes.  Otherwise she will be due for a routine follow-up next year given history of diminutive adenoma in the past and then subsequent negative colonoscopy in 2010.  I appreciate the opportunity to care for this patient. CC: [name], MD  Subjective:   Chief Complaint: Diarrhea constipation and abdominal pain  HPI The patient is an elderly woman here with her husband, she was last seen in our office in the fall with diarrhea symptoms which were thought to probably be irritable bowel syndrome.  She was recently in [place] for a wedding it was a bit stressful traveling, she had diarrhea while she was there and she was evaluated in an emergency department and had a low potassium at 3.3 and was given a prescription  for Lomotil which she did not take because she had resolution of her diarrhea.  That was occurring for about a week.  Otherwise her chemistries were normal and so was her CBC.  She was seen by Tye Savoy NP with diarrhea in November, actually at that time the diarrhea had resolved but she was getting follow-up and she was released to see [place] as needed.  She had seen Dr. Brigitte Pulse her primary care provider before that and we have a note and apparently stool studies were negative.  It was self-limited.  However as we talk it is clear that she has intermittent episodes of diarrhea up for a few days at a time, if she takes Imodium she then will move her bowels for up to 3 days.  That is not uncomfortable or a problem for her.  Sometimes she has constipation described as "thicker stools".  She denies any rectal bleeding.  Her last colonoscopy was negative for neoplasia in 20 performed by Dr. Deatra Ina and prior to that she had had a diminutive adenoma. She was thought to probably have had a diverticular hemorrhage in 2013 when she was seen for some self-limited transient hematochezia.  She was admitted for about 24-36 hours.  She did recently start a probiotic as a relative who is her cardiologist suggested that.  She makes and eats her own yogurt every day.  She has not noticed dietary triggers but stress may trigger things.  She is completing a course of  ampicillin for a UTI No Known Allergies Current Meds  Medication Sig  . aspirin EC 81 MG tablet Take 81 mg by mouth daily.  . diphenoxylate-atropine (LOMOTIL) 2.5-0.025 MG tablet Take by mouth as needed for diarrhea or loose stools.  Marland Kitchen loperamide (IMODIUM) 2 MG capsule Take by mouth as needed for diarrhea or loose stools.  . meloxicam (MOBIC) 15 MG tablet Take 15 mg by mouth daily.  . metoprolol tartrate (LOPRESSOR) 50 MG tablet Take 50 mg by mouth daily.  Marland Kitchen omeprazole (PRILOSEC) 20 MG capsule Take 20 mg by mouth as needed.  . Probiotic Product (PROBIOTIC-10  PO) Take by mouth daily.   Past Medical History:  Diagnosis Date  . Adenomatous colon polyp   . Arthritis   . Diverticulosis   . High cholesterol   . Hypertension   . IBS (irritable bowel syndrome)   . Palpitations   . UTI (urinary tract infection)    Past Surgical History:  Procedure Laterality Date  . BILATERAL CARPAL TUNNEL RELEASE    . COLONOSCOPY    . NM MYOCAR PERF WALL MOTION  10/02/2007   No significant ischemia demonstrated  . 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 US ECHOCARDIOGRAPHY  10/29/2010   Proximal septal thickening noted, EF =>55%,LA mildly dilated,mild mitral annular ca+, AOV mildly sclerotic, mild AI.   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   Review of Systems As per HPI  Objective:   Physical Exam BP 120/74   Pulse 82   Ht 5' 3.5" (1.613 m)   Wt 176 lb 6 oz (80 kg)   BMI 30.75 kg/m  Well-developed elderly woman in no acute distress Eyes are anicteric Abdomen is soft somewhat obese nontender without organomegaly or mass and bowel sounds are present Her mood is appropriate she has a normal affect Alert and oriented x3

## 2018-03-08 DIAGNOSIS — N3000 Acute cystitis without hematuria: Secondary | ICD-10-CM | POA: Diagnosis not present

## 2018-03-08 DIAGNOSIS — R300 Dysuria: Secondary | ICD-10-CM | POA: Diagnosis not present

## 2018-03-08 DIAGNOSIS — N3020 Other chronic cystitis without hematuria: Secondary | ICD-10-CM | POA: Diagnosis not present

## 2018-03-10 ENCOUNTER — Telehealth: Payer: Self-pay | Admitting: Internal Medicine

## 2018-03-10 NOTE — Telephone Encounter (Signed)
Patient encouraged to follow recommendations given to her at the last office visit of loperamide 1/2 daily and probiotic.  She is encouraged to keep her follow up for June

## 2018-03-17 DIAGNOSIS — R300 Dysuria: Secondary | ICD-10-CM | POA: Diagnosis not present

## 2018-03-17 DIAGNOSIS — N3020 Other chronic cystitis without hematuria: Secondary | ICD-10-CM | POA: Diagnosis not present

## 2018-03-29 DIAGNOSIS — R300 Dysuria: Secondary | ICD-10-CM | POA: Diagnosis not present

## 2018-04-04 DIAGNOSIS — R300 Dysuria: Secondary | ICD-10-CM | POA: Diagnosis not present

## 2018-04-04 DIAGNOSIS — R350 Frequency of micturition: Secondary | ICD-10-CM | POA: Diagnosis not present

## 2018-04-18 ENCOUNTER — Encounter (INDEPENDENT_AMBULATORY_CARE_PROVIDER_SITE_OTHER): Payer: Self-pay

## 2018-04-18 ENCOUNTER — Ambulatory Visit (INDEPENDENT_AMBULATORY_CARE_PROVIDER_SITE_OTHER): Payer: Medicare Other | Admitting: Internal Medicine

## 2018-04-18 ENCOUNTER — Encounter: Payer: Self-pay | Admitting: Internal Medicine

## 2018-04-18 VITALS — BP 130/80 | HR 76 | Ht 63.0 in | Wt 176.0 lb

## 2018-04-18 DIAGNOSIS — R300 Dysuria: Secondary | ICD-10-CM

## 2018-04-18 DIAGNOSIS — K582 Mixed irritable bowel syndrome: Secondary | ICD-10-CM

## 2018-04-18 NOTE — Patient Instructions (Signed)
  Follow up with Dr Carlean Purl as needed.   Glad your doing better.   Talk with Dr Junious Silk about interstitial cystitis.    I appreciate the opportunity to care for you. Silvano Rusk, MD, [facility]

## 2018-04-18 NOTE — Progress Notes (Signed)
   Jade Hernandez 77 y.o. 03-Oct-1941 [id number]  Assessment & Plan:   Encounter Diagnoses  Name Primary?  . Irritable bowel syndrome with both constipation and diarrhea Yes  . Dysuria     IBS is not symptomatic at this time.  Use loperamide half tablet as needed.  I said she should discuss interstitial cystitis as a possible diagnosis with her urologist Dr. Junious Silk she has a follow-up with him soon.  I appreciate the opportunity to care for this patient. CC: [name], MD Dr. Eda Keys   Subjective:   Chief Complaint: Follow-up of irritable bowel syndrome  HPI The patient returns with her husband reporting that she has not had any diarrhea since I saw her a couple of months ago.  She is really done well and not needed to use any loperamide.  She does complain of a lot of dysuria and urinary tract-like symptoms and says she has been checked multiple times and does not have a UTI.  She went to pelvic physical therapy which is helped a little bit.  She used to see Dr. Terance Hart but now sees Dr. Junious Silk.  She does not give a history of being diagnosed with interstitial cystitis but says that she and her daughter looked it up online and her symptoms sound like that. No Known Allergies Current Meds  Medication Sig  . acetaminophen (TYLENOL) 325 MG tablet Take 325-487.5 mg by mouth every 6 (six) hours as needed (for knee pain.).  Marland Kitchen aspirin EC 81 MG tablet Take 81 mg by mouth daily.  . hydrochlorothiazide (HYDRODIURIL) 25 MG tablet Take 25 mg by mouth daily.  . meloxicam (MOBIC) 15 MG tablet Take 15 mg by mouth daily.  . metoprolol tartrate (LOPRESSOR) 50 MG tablet Take 50 mg by mouth daily.  Marland Kitchen omeprazole (PRILOSEC) 20 MG capsule Take 20 mg by mouth as needed.  . phenazopyridine (AZO DINE) 95 MG tablet Take 95 mg by mouth 3 (three) times daily as needed for pain.  . Probiotic Product (PROBIOTIC-10 PO) Take by mouth daily.  . simvastatin (ZOCOR) 40 MG tablet Take 40 mg by  mouth daily.    Past Medical History:  Diagnosis Date  . Adenomatous colon polyp   . Arthritis   . Diverticulosis   . High cholesterol   . Hypertension   . IBS (irritable bowel syndrome)   . Palpitations   . UTI (urinary tract infection)    Past Surgical History:  Procedure Laterality Date  . BILATERAL CARPAL TUNNEL RELEASE    . COLONOSCOPY    . NM MYOCAR PERF WALL MOTION  10/02/2007   No significant ischemia demonstrated  . 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 US ECHOCARDIOGRAPHY  10/29/2010   Proximal septal thickening noted, EF =>55%,LA mildly dilated,mild mitral annular ca+, AOV mildly sclerotic, mild AI.     Review of Systems As above  Objective:   Physical Exam BP 130/80   Pulse 76   Ht 5\' 3"  (1.6 m)   Wt 176 lb (79.8 kg)   BMI 31.18 kg/m    15 minutes time spent with patient > half in counseling coordination of care

## 2018-04-24 DIAGNOSIS — M6281 Muscle weakness (generalized): Secondary | ICD-10-CM | POA: Diagnosis not present

## 2018-04-24 DIAGNOSIS — M62838 Other muscle spasm: Secondary | ICD-10-CM | POA: Diagnosis not present

## 2018-04-24 DIAGNOSIS — R351 Nocturia: Secondary | ICD-10-CM | POA: Diagnosis not present

## 2018-04-24 DIAGNOSIS — R300 Dysuria: Secondary | ICD-10-CM | POA: Diagnosis not present

## 2018-05-03 DIAGNOSIS — I10 Essential (primary) hypertension: Secondary | ICD-10-CM | POA: Diagnosis not present

## 2018-05-03 DIAGNOSIS — E782 Mixed hyperlipidemia: Secondary | ICD-10-CM | POA: Diagnosis not present

## 2018-05-03 DIAGNOSIS — Z6831 Body mass index (BMI) 31.0-31.9, adult: Secondary | ICD-10-CM | POA: Diagnosis not present

## 2018-05-03 DIAGNOSIS — E669 Obesity, unspecified: Secondary | ICD-10-CM | POA: Diagnosis not present

## 2018-05-04 DIAGNOSIS — M6281 Muscle weakness (generalized): Secondary | ICD-10-CM | POA: Diagnosis not present

## 2018-05-04 DIAGNOSIS — R300 Dysuria: Secondary | ICD-10-CM | POA: Diagnosis not present

## 2018-05-04 DIAGNOSIS — M62838 Other muscle spasm: Secondary | ICD-10-CM | POA: Diagnosis not present

## 2018-05-04 DIAGNOSIS — R351 Nocturia: Secondary | ICD-10-CM | POA: Diagnosis not present

## 2018-05-31 DIAGNOSIS — Z1231 Encounter for screening mammogram for malignant neoplasm of breast: Secondary | ICD-10-CM | POA: Diagnosis not present

## 2018-06-01 DIAGNOSIS — M6281 Muscle weakness (generalized): Secondary | ICD-10-CM | POA: Diagnosis not present

## 2018-06-01 DIAGNOSIS — M62838 Other muscle spasm: Secondary | ICD-10-CM | POA: Diagnosis not present

## 2018-06-01 DIAGNOSIS — R300 Dysuria: Secondary | ICD-10-CM | POA: Diagnosis not present

## 2018-06-08 DIAGNOSIS — R922 Inconclusive mammogram: Secondary | ICD-10-CM | POA: Diagnosis not present

## 2018-06-08 DIAGNOSIS — N6002 Solitary cyst of left breast: Secondary | ICD-10-CM | POA: Diagnosis not present

## 2018-06-21 DIAGNOSIS — S0502XA Injury of conjunctiva and corneal abrasion without foreign body, left eye, initial encounter: Secondary | ICD-10-CM | POA: Diagnosis not present

## 2018-06-22 DIAGNOSIS — S0502XA Injury of conjunctiva and corneal abrasion without foreign body, left eye, initial encounter: Secondary | ICD-10-CM | POA: Diagnosis not present

## 2018-07-03 DIAGNOSIS — N3020 Other chronic cystitis without hematuria: Secondary | ICD-10-CM | POA: Diagnosis not present

## 2018-07-03 DIAGNOSIS — R300 Dysuria: Secondary | ICD-10-CM | POA: Diagnosis not present

## 2018-07-06 DIAGNOSIS — R062 Wheezing: Secondary | ICD-10-CM | POA: Diagnosis not present

## 2018-07-06 DIAGNOSIS — R05 Cough: Secondary | ICD-10-CM | POA: Diagnosis not present

## 2018-07-06 DIAGNOSIS — L570 Actinic keratosis: Secondary | ICD-10-CM | POA: Diagnosis not present

## 2018-07-07 DIAGNOSIS — N281 Cyst of kidney, acquired: Secondary | ICD-10-CM | POA: Diagnosis not present

## 2018-07-07 DIAGNOSIS — R300 Dysuria: Secondary | ICD-10-CM | POA: Diagnosis not present

## 2018-07-07 DIAGNOSIS — R350 Frequency of micturition: Secondary | ICD-10-CM | POA: Diagnosis not present

## 2018-07-21 ENCOUNTER — Other Ambulatory Visit: Payer: Self-pay | Admitting: Cardiovascular Disease

## 2018-09-01 DIAGNOSIS — Z23 Encounter for immunization: Secondary | ICD-10-CM | POA: Diagnosis not present

## 2018-11-03 ENCOUNTER — Other Ambulatory Visit: Payer: Self-pay | Admitting: Cardiovascular Disease

## 2018-11-03 NOTE — Telephone Encounter (Signed)
Rx sent to pharmacy   

## 2018-11-09 ENCOUNTER — Other Ambulatory Visit: Payer: Self-pay | Admitting: Cardiovascular Disease

## 2018-11-20 DIAGNOSIS — E669 Obesity, unspecified: Secondary | ICD-10-CM | POA: Diagnosis not present

## 2018-11-20 DIAGNOSIS — Z6833 Body mass index (BMI) 33.0-33.9, adult: Secondary | ICD-10-CM | POA: Diagnosis not present

## 2018-11-20 DIAGNOSIS — M1990 Unspecified osteoarthritis, unspecified site: Secondary | ICD-10-CM | POA: Diagnosis not present

## 2018-11-20 DIAGNOSIS — D72819 Decreased white blood cell count, unspecified: Secondary | ICD-10-CM | POA: Diagnosis not present

## 2018-11-20 DIAGNOSIS — K589 Irritable bowel syndrome without diarrhea: Secondary | ICD-10-CM | POA: Diagnosis not present

## 2018-11-20 DIAGNOSIS — N760 Acute vaginitis: Secondary | ICD-10-CM | POA: Diagnosis not present

## 2018-11-20 DIAGNOSIS — E782 Mixed hyperlipidemia: Secondary | ICD-10-CM | POA: Diagnosis not present

## 2018-11-20 DIAGNOSIS — I10 Essential (primary) hypertension: Secondary | ICD-10-CM | POA: Diagnosis not present

## 2018-11-20 DIAGNOSIS — K219 Gastro-esophageal reflux disease without esophagitis: Secondary | ICD-10-CM | POA: Diagnosis not present

## 2018-11-20 DIAGNOSIS — M899 Disorder of bone, unspecified: Secondary | ICD-10-CM | POA: Diagnosis not present

## 2018-11-20 DIAGNOSIS — Z0000 Encounter for general adult medical examination without abnormal findings: Secondary | ICD-10-CM | POA: Diagnosis not present

## 2018-12-05 DIAGNOSIS — N760 Acute vaginitis: Secondary | ICD-10-CM | POA: Diagnosis not present

## 2018-12-05 DIAGNOSIS — L293 Anogenital pruritus, unspecified: Secondary | ICD-10-CM | POA: Diagnosis not present

## 2018-12-05 DIAGNOSIS — L900 Lichen sclerosus et atrophicus: Secondary | ICD-10-CM | POA: Diagnosis not present

## 2018-12-12 ENCOUNTER — Encounter: Payer: Self-pay | Admitting: Internal Medicine

## 2018-12-12 NOTE — Telephone Encounter (Signed)
Error

## 2018-12-13 DIAGNOSIS — H52203 Unspecified astigmatism, bilateral: Secondary | ICD-10-CM | POA: Diagnosis not present

## 2018-12-13 DIAGNOSIS — H25013 Cortical age-related cataract, bilateral: Secondary | ICD-10-CM | POA: Diagnosis not present

## 2018-12-13 DIAGNOSIS — H11131 Conjunctival pigmentations, right eye: Secondary | ICD-10-CM | POA: Diagnosis not present

## 2018-12-20 ENCOUNTER — Encounter: Payer: Self-pay | Admitting: Physician Assistant

## 2018-12-20 ENCOUNTER — Ambulatory Visit (INDEPENDENT_AMBULATORY_CARE_PROVIDER_SITE_OTHER): Payer: Medicare Other | Admitting: Physician Assistant

## 2018-12-20 VITALS — BP 130/60 | HR 88 | Ht 63.0 in | Wt 176.0 lb

## 2018-12-20 DIAGNOSIS — K580 Irritable bowel syndrome with diarrhea: Secondary | ICD-10-CM

## 2018-12-20 DIAGNOSIS — R109 Unspecified abdominal pain: Secondary | ICD-10-CM

## 2018-12-20 MED ORDER — HYOSCYAMINE SULFATE 0.125 MG SL SUBL: 0.1250 mg | SUBLINGUAL_TABLET | SUBLINGUAL | 1 refills | Status: DC | PRN

## 2018-12-20 NOTE — Patient Instructions (Signed)
You have been scheduled for a colonoscopy. Please follow written instructions given to you at your visit today.  Please pick up your prep supplies at the pharmacy within the next 1-3 days. If you use inhalers (even only as needed), please bring them with you on the day of your procedure.  Your physician has requested that you go to www.startemmi.com and enter the access code given to you at your visit today. This web site gives a general overview about your procedure. However, you should still follow specific instructions given to you by our office regarding your preparation for the procedure.  If you are age 65 or older, your body mass index should be between 23-30. Your Body mass index is 31.18 kg/m. If this is out of the aforementioned range listed, please consider follow up with your Primary Care Provider.    Stop other probiotic  We have sent the following medications to your pharmacy for you to pick up at your convenience: Hyoscyamine   Start IBgard - 1 capsule twice daily for 1 month, then once daily therefore after.   Thank you for choosing me and [facility].

## 2018-12-20 NOTE — Progress Notes (Addendum)
Chief Complaint: Diarrhea and abdominal cramping  HPI:    Jade Hernandez is a 78 year old female, known to Dr. Carlean Purl, with a past medical history of IBS who presents to clinic today with a complaint of some diarrhea and abdominal cramping.      05/04/2009 colonoscopy by Dr. Deatra Ina with diverticula scattered in the proximal transverse colon and otherwise normal.  Repeat recommended in 10 years.    02/24/2018 office visit with Dr. Carlean Purl.  Described diarrhea predominant IBS and constipation.  She was told to use Loperamide a half tab at a time as needed.  Also discussed probiotics and possibly IBgard.  Briefly discussed colonoscopy, recommended she have one the next year if necessary.    04/18/2018 office visit with Dr. Carlean Purl, was doing well, IBS was not symptomatic.  Continued on loperamide half tablet as needed.    Today,  Presents to clinic accompanied by her husband, and tells me that earlier this week she started again with some diarrhea accompanied by generalized abdominal cramping and bloating.  Patient tells me she has never had the cramping pain with the diarrhea before and this did worry her some.  Took Tylenol which seemed to help.  Restarted her Imodium and has been going back to normal stools over the past few days.  Continues on a daily probiotic.    Husband would like it noted that his brother died under complications from anesthesia during the procedure.    Denies fever, chills, weight loss, nausea, vomiting or blood in her stool.  Past Medical History:  Diagnosis Date  . Adenomatous colon polyp   . Arthritis   . Diverticulosis   . High cholesterol   . Hypertension   . IBS (irritable bowel syndrome)   . Palpitations   . UTI (urinary tract infection)     Past Surgical History:  Procedure Laterality Date  . BILATERAL CARPAL TUNNEL RELEASE    . COLONOSCOPY    . NM MYOCAR PERF WALL MOTION  10/02/2007   No significant ischemia demonstrated  . TOTAL KNEE ARTHROPLASTY Left  02/21/2017   Procedure: LEFT TOTAL KNEE ARTHROPLASTY;  Surgeon: Gaynelle Arabian, MD;  Location: [facility];  Service: Orthopedics;  Laterality: Left;  requests 61mins; Adductor Block  . US ECHOCARDIOGRAPHY  10/29/2010   Proximal septal thickening noted, EF =>55%,LA mildly dilated,mild mitral annular ca+, AOV mildly sclerotic, mild AI.    Current Outpatient Medications  Medication Sig Dispense Refill  . acetaminophen (TYLENOL) 325 MG tablet Take 325-487.5 mg by mouth every 6 (six) hours as needed (for knee pain.).    Marland Kitchen aspirin EC 81 MG tablet Take 81 mg by mouth daily.    . hydrochlorothiazide (HYDRODIURIL) 25 MG tablet Take 25 mg by mouth daily.    . meloxicam (MOBIC) 15 MG tablet Take 15 mg by mouth daily.    . metoprolol succinate (TOPROL-XL) 50 MG 24 hr tablet TAKE 2 TABLETS(100 MG) BY MOUTH DAILY (Patient taking differently: 50 mg. ** DO NOT CRUSH **    (BETA BLOCKER)) 180 tablet 0  . metoprolol tartrate (LOPRESSOR) 50 MG tablet Take 50 mg by mouth daily.    Marland Kitchen omeprazole (PRILOSEC) 20 MG capsule Take 20 mg by mouth as needed.    . Probiotic Product (PROBIOTIC-10 PO) Take by mouth daily.    . simvastatin (ZOCOR) 40 MG tablet Take 40 mg by mouth daily.     . hyoscyamine (LEVSIN SL) 0.125 MG SL tablet Place 1 tablet (0.125 mg total) under the tongue every  4 (four) hours as needed. 30 tablet 1   No current facility-administered medications for this visit.     Allergies as of 12/20/2018  . (No Known Allergies)    Family History  Problem Relation Age of Onset  . Hypertension Mother   . Diabetes Mother   . Heart disease Father        died from heart attack  . Heart disease Brother   . Colon cancer Neg Hx   . Rectal cancer Neg Hx   . Liver cancer Neg Hx   . Stomach cancer Neg Hx     Social History   Socioeconomic History  . Marital status: Married    Spouse name: Not on file  . Number of children: Not on file  . Years of education: Not on file  . Highest education level: Not on file   Occupational History  . Occupation: semi-retired  Social Needs  . Financial resource strain: Not on file  . Food insecurity:    Worry: Not on file    Inability: Not on file  . Transportation needs:    Medical: Not on file    Non-medical: Not on file  Tobacco Use  . Smoking status: Never Smoker  . Smokeless tobacco: Never Used  Substance and Sexual Activity  . Alcohol use: Yes    Alcohol/week: 1.0 standard drinks    Types: 1 Glasses of wine per week    Comment: occasional  . Drug use: No  . Sexual activity: Not on file  Lifestyle  . Physical activity:    Days per week: Not on file    Minutes per session: Not on file  . Stress: Not on file  Relationships  . Social connections:    Talks on phone: Not on file    Gets together: Not on file    Attends religious service: Not on file    Active member of club or organization: Not on file    Attends meetings of clubs or organizations: Not on file    Relationship status: Not on file  . Intimate partner violence:    Fear of current or ex partner: Not on file    Emotionally abused: Not on file    Physically abused: Not on file    Forced sexual activity: Not on file  Other Topics Concern  . Not on file  Social History Narrative   The patient is married she is originally from [place] I believe   Family owns [employer]   She does have children   Rare alcohol no tobacco or drug use    Review of Systems:    Constitutional: No weight loss, fever or chills Cardiovascular: No chest pain Respiratory: No SOB Gastrointestinal: See HPI and otherwise negative   Physical Exam:  Vital signs: BP 130/60   Pulse 88   Ht 5\' 3"  (1.6 m)   Wt 176 lb (79.8 kg)   BMI 31.18 kg/m   Constitutional:   Pleasant female appears to be in NAD, Well developed, Well nourished, alert and cooperative Respiratory: Respirations even and unlabored. Lungs clear to auscultation bilaterally.   No wheezes, crackles, or rhonchi.    Cardiovascular: Normal S1, S2. No MRG. Regular rate and rhythm. No peripheral edema, cyanosis or pallor.  Gastrointestinal:  Soft, nondistended, Mild generalizes ttp,  No rebound or guarding. Normal bowel sounds. No appreciable masses or hepatomegaly. Psychiatric:  Demonstrates good judgement and reason without abnormal affect or behaviors.  No recent labs or imaging.  Assessment: 1.  IBS-D: Symptomatic over the past week, helped by 1/2 tab of Loperamide, but now with some cramping which is concerning for the patient 2.  Abdominal cramping: With above  Plan: 1.  Patient would like to proceed with colonoscopy at this time given her ongoing change in bowel habits off and on.  She is also due from a screening perspective. 2.  Scheduled patient for colonoscopy in the [place] with Dr. Carlean Purl.  Did discuss risks, benefits, limitations and alternatives and patient agrees to proceed. 3.  Prescribed Hyoscyamine sulfate 0.125 mg sublingual tabs, 1 tab every 4-6 hours as needed for abdominal cramping #30 with 3 refills 4.  Provided the patient with samples of IBgard.  Discussed taking 1 tab twice daily for a month and then decreasing to 1 tab once daily.  She can discontinue her other probiotic while using this. 5.  Patient to follow in clinic per recommendations from Dr. Carlean Purl after time of procedure.  Jade Newer, PA-C [facility] 12/20/2018, 11:39 AM  Cc: Jade Neer, MD   Agree with Ms. Jade Hernandez's evaluation and management.  Gatha Mayer, MD, Jade Hernandez

## 2018-12-22 DIAGNOSIS — N3020 Other chronic cystitis without hematuria: Secondary | ICD-10-CM | POA: Diagnosis not present

## 2018-12-26 ENCOUNTER — Telehealth: Payer: Self-pay | Admitting: Physician Assistant

## 2018-12-26 MED ORDER — DICYCLOMINE HCL 10 MG PO CAPS: 10.0000 mg | ORAL_CAPSULE | Freq: Four times a day (QID) | ORAL | 0 refills | Status: DC | PRN

## 2018-12-26 NOTE — Telephone Encounter (Signed)
Pt states her insurance does not cover hyoscyamine, would you recommend an alternative. Also she canceled her colonoscopy, she wanted to know if their were any alternatives to screening. I explained to her that this was because she is having diarrhea and she also has a history of polyps (2004). She states she thinks she is almost at the age to stop having  colonoscopies and will call back if she thinks she needs to have one.

## 2018-12-26 NOTE — Telephone Encounter (Signed)
Jade Hernandez,  Please let her know that I think she should do the colonoscopy as we had originally planned.

## 2018-12-26 NOTE — Telephone Encounter (Signed)
Left message on patients voicemail to call office.   

## 2018-12-26 NOTE — Telephone Encounter (Signed)
[name]   Do you have any other recommendations?

## 2018-12-26 NOTE — Telephone Encounter (Signed)
Pt had OV 2.5.20 with Ellouise Newer, PA.  Pt reported that the probiotic [name] had prescribed contains too much peppermint and pt cannot take it.  Pt prefers her regular probiotic.  Please CB.

## 2018-12-26 NOTE — Telephone Encounter (Signed)
Can prescribe Dicyclomine 10mg  q6 hrs prn instead. Also I will forward this to Dr. Carlean Purl so he can discuss further with her. THanks-JLL

## 2018-12-26 NOTE — Telephone Encounter (Signed)
She can stay on her current probiotic or try Align . THanks-JLL

## 2018-12-27 NOTE — Telephone Encounter (Signed)
Received  PA for Bentyl 10mg . Per [facility] pts plan does not cover. Pt has MCR part D.  Spoke with pharmacy and pt would need to pay $37.22 OOP for a 90d supply.  Informed pt of this and she states that she is not having much cramping and that it only comes every now and then.   She is fine not getting either Levsin or Bentyl at this time. She is only taking a Probiotic daily.   FYI.Jade Hernandez was not helpful and made her feel worse so she is no longer taking.  Pt will call if abdominal cramping returns.

## 2018-12-27 NOTE — Telephone Encounter (Signed)
Patient is returning your call.  

## 2019-01-08 ENCOUNTER — Encounter: Payer: Self-pay | Admitting: Cardiovascular Disease

## 2019-01-08 ENCOUNTER — Ambulatory Visit (INDEPENDENT_AMBULATORY_CARE_PROVIDER_SITE_OTHER): Payer: Medicare Other | Admitting: Cardiovascular Disease

## 2019-01-08 VITALS — BP 148/90 | HR 81 | Ht 63.0 in | Wt 173.0 lb

## 2019-01-08 DIAGNOSIS — E7800 Pure hypercholesterolemia, unspecified: Secondary | ICD-10-CM

## 2019-01-08 DIAGNOSIS — R002 Palpitations: Secondary | ICD-10-CM

## 2019-01-08 DIAGNOSIS — K580 Irritable bowel syndrome with diarrhea: Secondary | ICD-10-CM | POA: Diagnosis not present

## 2019-01-08 DIAGNOSIS — I10 Essential (primary) hypertension: Secondary | ICD-10-CM | POA: Diagnosis not present

## 2019-01-08 MED ORDER — METOPROLOL SUCCINATE ER 50 MG PO TB24: ORAL_TABLET | ORAL | 1 refills | Status: DC

## 2019-01-08 NOTE — Progress Notes (Signed)
Patient ID: Jade Hernandez, female   DOB: [birth year], 78 y.o.   MRN: [id number]     PCP: Dr. Mayra Hernandez  HPI: Jade Hernandez is a 78 y.o. female who presents to the office for a 60 month cardiology evaluation. I last saw her in September 2017.  She is the sister of my patient Mr. Jade Hernandez.  Jade Hernandez has a strong family history for premature coronary disease and has a history of hypertension, palpitations, and hyperlipidemia. In December 2011 an ECHO Doppler study showed proximal septal thickening without outflow tract obstruction, normal LV systolic function but grade 1 diastolic dysfunction. There was mild LA dilatation, mild MAC with mild MR, TR and mild aortic valve sclerosis with mild AR. A nuclear perfusion study in 2008 revealed normal perfusion. Recently, she has noticed that her blood pressure has been tending to stay in the range of 140. She denies any chest tightness. She denies dyspnea. He denies palpitations. She presents for evaluation.  In August 2015 a F/U echo revealed normal renal function with a BUN of 14, creatine 0.43.  Fasting glucose was 85.  LFTs were normal.  Total cholesterol was 165, HDL 52, triglycerides 166, and LDL cholesterol 80.  When I  she was doing well on a medical regimen with simvastatin 40 mg for hyperlipidemia. She takes Toprol-XL 75 mg and HCTZ 25 mg for palpitations and hypertension. She was on Prilosec for GERD.  She also is bothered by right knee pain for which she takes meloxicam 15 mg on an as-needed basis.    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  Since I last saw her, she was advised by her primary physician to reduce her Toprol dose down to 50 mg.  Has noticed that her pulse is faster.  She also admits to occasional episodes of palpitations.  She has had issues with diarrhea and abdominal cramping and has been evaluated by GI and is felt to have  irritable bowel syndrome.  She will be undergoing a repeat colonoscopy in several weeks.  She denies chest pain PND orthopnea.   Past Medical History:  Diagnosis Date  . Adenomatous colon polyp   . Arthritis   . Diverticulosis   . High cholesterol   . Hypertension   . IBS (irritable bowel syndrome)   . Palpitations   . UTI (urinary tract infection)     Past Surgical History:  Procedure Laterality Date  . BILATERAL CARPAL TUNNEL RELEASE    . COLONOSCOPY    . NM MYOCAR PERF WALL MOTION  10/02/2007   No significant ischemia demonstrated  . TOTAL KNEE ARTHROPLASTY Left 02/21/2017   Procedure: LEFT TOTAL KNEE ARTHROPLASTY;  Surgeon: Jade Arabian, MD;  Location: [facility];  Service: Orthopedics;  Laterality: Left;  requests 30mns; Adductor Block  . UKoreaECHOCARDIOGRAPHY  10/29/2010   Proximal septal thickening noted, EF =>55%,LA mildly dilated,mild mitral annular ca+, AOV mildly sclerotic, mild AI.    No Known Allergies  Current Outpatient Medications  Medication Sig Dispense Refill  . acetaminophen (TYLENOL) 325 MG tablet Take 325-487.5 mg by mouth every 6 (six) hours as needed (for knee pain.).    .Marland Kitchenaspirin EC 81 MG tablet Take 81 mg by mouth daily.    . hydrochlorothiazide (HYDRODIURIL) 25 MG tablet Take 25 mg by mouth. 1-2 times weekly    . meloxicam (MOBIC) 15 MG tablet Take 15 mg by  mouth daily.    . metoprolol succinate (TOPROL-XL) 50 MG 24 hr tablet Take 1.5 tablet (75 mg) by mouth daily. 90 tablet 1  . omeprazole (PRILOSEC) 20 MG capsule Take 20 mg by mouth as needed.    . Probiotic Product (PROBIOTIC-10 PO) Take by mouth daily.    . simvastatin (ZOCOR) 40 MG tablet Take 40 mg by mouth daily.      No current facility-administered medications for this visit.     Social History   Socioeconomic History  . Marital status: Married    Spouse name: Not on file  . Number of children: Not on file  . Years of education: Not on file  . Highest education level: Not on file    Occupational History  . Occupation: semi-retired  Social Needs  . Financial resource strain: Not on file  . Food insecurity:    Worry: Not on file    Inability: Not on file  . Transportation needs:    Medical: Not on file    Non-medical: Not on file  Tobacco Use  . Smoking status: Never Smoker  . Smokeless tobacco: Never Used  Substance and Sexual Activity  . Alcohol use: Yes    Alcohol/week: 1.0 standard drinks    Types: 1 Glasses of wine per week    Comment: occasional  . Drug use: No  . Sexual activity: Not on file  Lifestyle  . Physical activity:    Days per week: Not on file    Minutes per session: Not on file  . Stress: Not on file  Relationships  . Social connections:    Talks on phone: Not on file    Gets together: Not on file    Attends religious service: Not on file    Active member of club or organization: Not on file    Attends meetings of clubs or organizations: Not on file    Relationship status: Not on file  . Intimate partner violence:    Fear of current or ex partner: Not on file    Emotionally abused: Not on file    Physically abused: Not on file    Forced sexual activity: Not on file  Other Topics Concern  . 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 Her brother is my patient Mr. Jade Hernandez.  She states she cannot walk due to knee discomfort and often times tries to march in place for exercise.  Family History  Problem Relation Age of Onset  . Hypertension Mother   . Diabetes Mother   . Heart disease Father        died from heart attack  . Heart disease Brother   . Colon cancer Neg Hx   . Rectal cancer Neg Hx   . Liver cancer Neg Hx   . Stomach cancer Neg Hx    ROS General: Negative; No fevers, chills, or night sweats;  no success with weight loss HEENT: Negative; No changes in vision or hearing, sinus congestion, difficulty swallowing Pulmonary: Negative; No cough, wheezing, shortness of breath, hemoptysis Cardiovascular: See HPI GI: Negative; No nausea, vomiting, diarrhea, or abdominal pain GU: Irritable bowel syndrome Musculoskeletal: Positive for right knee discomfort; status post left knee arthroplasty Hematologic/Oncology: Negative; no  easy bruising, bleeding Endocrine: Negative; no heat/cold intolerance; no diabetes Neuro: Negative; no changes in balance, headaches Skin: Negative; No rashes or skin lesions Psychiatric: Negative; No behavioral problems, depression Sleep: Negative; No snoring, daytime sleepiness, hypersomnolence, bruxism, restless legs, hypnogognic hallucinations, no cataplexy Other comprehensive 14 point system review is negative.  PE BP (!) 148/90   Pulse 81   Ht _0  (1.6 m)   Wt 173 lb (78.5 kg)   BMI 30.65 kg/m    Repeat blood pressure by me was 138/86  Wt Readings from Last 3 Encounters:  01/08/19 173 lb (78.5 kg)  12/20/18 176 lb (79.8 kg)  04/18/18 176 lb (79.8 kg)   General: Alert, oriented, no distress.  Skin: normal turgor, no rashes, warm and dry HEENT: Normocephalic, atraumatic. Pupils equal round and reactive to light; sclera anicteric; extraocular muscles intact;  Nose without nasal septal hypertrophy Mouth/Parynx benign; Mallinpatti scale 3 Neck: No JVD, no carotid bruits; normal carotid upstroke Lungs: clear to ausculatation and percussion; no wheezing or rales Chest wall: without tenderness to palpitation Heart: PMI not displaced, RRR, s1 s2 normal, 1/6 systolic murmur, no diastolic murmur, no rubs, gallops, thrills, or heaves Abdomen: soft, nontender; no hepatosplenomehaly, BS+; abdominal aorta nontender and not dilated by palpation. Back: no CVA tenderness Pulses 2+ Musculoskeletal: full range of motion, normal strength, no joint  deformities Extremities: no clubbing cyanosis or edema, Homan's sign negative  Neurologic: grossly nonfocal; Cranial nerves grossly wnl Psychologic: Normal mood and affect  ECG (independently read by me): NSR at 81; easrly transition, normal intervals  September 2017 ECG (independently read by me): Normal sinus rhythm at 73 bpm.  No ectopy.  Normal intervals.  June 2016 ECG (independently read by me): Normal sinus rhythm at 72 bpm.  Early transition.  No ectopy.  QTC 462 ms.  February 2015 ECG (independently read by me): Normal sinus rhythm at 69 beats per minute. Mild RV conduction delay. No ectopy.  LABS: BMP Latest Ref Rng & Units 02/23/2017 02/22/2017 02/15/2017  Glucose 65 - 99 mg/dL 107(H) 145(H) 89  BUN 6 - 20 mg/dL _1 Creatinine 0.44 - 1.00 mg/dL 0.53 0.47 0.44  Sodium 135 - 145 mmol/L 139 138 139  Potassium 3.5 - 5.1 mmol/L 3.3(L) 3.6 3.7  Chloride 101 - 111 mmol/L 102 109 105  CO2 22 - 32 mmol/L _2 Calcium 8.9 - 10.3 mg/dL 8.9 8.3(L) 9.3    Hepatic Function Latest Ref Rng & Units 02/15/2017 06/17/2012  Total Protein 6.5 - 8.1 g/dL 6.6 6.7  Albumin 3.5 - 5.0 g/dL 3.8 3.5  AST 15 - 41 U/L 23 16  ALT 14 - 54 U/L 18 12  Alk Phosphatase 38 - 126 U/L 49 48  Total Bilirubin 0.3 - 1.2 mg/dL 1.1 0.5   CBC Latest Ref Rng & Units 02/24/2017 02/23/2017 02/22/2017  WBC 4.0 - 10.5 K/uL 7.5 9.1 9.4  Hemoglobin 12.0 - 15.0 g/dL 10.5(L) 11.7(L) 10.4(L)  Hematocrit 36.0 - 46.0 % 32.0(L) 35.7(L) 31.6(L)  Platelets 150 - 400 K/uL 165 180 166   No results found for: TSH  Lipid Panel  No results found for: CHOL, TRIG, HDL, CHOLHDL, VLDL, LDLCALC, LDLDIRECT  RADIOLOGY: No results found.  IMPRESSION: 1. Essential hypertension   2. Palpitations   3. Irritable bowel syndrome with diarrhea   4. Pure hypercholesterolemia    ASSESSMENT AND PLAN: Ms Hocevar is a very pleasant 78 year old female who has a history of hypertension, palpitations, hyperlipidemia.  When  I had seen her  previously, she had stabilized nicely with reference to palpitations and blood pressure on a regimen consisting of Toprol-XL 75 mg in addition to HCTZ.  Apparently, since that time she was told to reduce her Toprol-XL to just 50 mg by her primary physician and she has been taking the hydrochlorothiazide approximately 2 times per week.  Her blood pressure today is elevated and initially was stage II with a blood pressure 148/90.  Repeat blood pressure by me was improved but still stage I hypertension.  I discussed with her the new hypertensive guidelines.  Her resting pulse is in the 80s despite taking her Toprol 50 mg.  I have suggested she increase this back to 75 mg daily for improved blood pressure control as well as improvement in her sense of increased heart rate and occasional palpitations.  She has irritable bowel syndrome and will be undergoing colonoscopy in several weeks.  She is not having any chest pain or anginal symptoms.  At present there is no significant edema but she has been taking HCTZ 5 mg twice a week.  She will monitor her blood pressure and if her blood pressure consistently is above 130/80 her dose of HCTZ Z may need to be increased in frequency.  SHe continues to be on simvastatin 40 mg for hyperlipidemia and is tolerating this well.  She has not had recent laboratory by her primary provider this will need to be rechecked.  I will see her in 1 year for reevaluation.   Troy Sine, MD, [facility]  01/09/2019 11:23 AM

## 2019-01-08 NOTE — Patient Instructions (Signed)
Medication Instructions:  Increase Metoprolol to 75 mg (1.5 tablet) daily.  If you need a refill on your cardiac medications before your next appointment, please call your pharmacy.    Follow-Up: At [facility], you and your health needs are our priority.  As part of our continuing mission to provide you with exceptional heart care, we have created designated Provider Care Teams.  These Care Teams include your primary Cardiologist (physician) and Advanced Practice Providers (APPs -  Physician Assistants and Nurse Practitioners) who all work together to provide you with the care you need, when you need it. You will need a follow up appointment in 12 months.  Please call our office 2 months in advance to schedule this appointment.  You may see Dr.Kelly or one of the following Advanced Practice Providers on your designated Care Team: Almyra Deforest, [place] . Fabian Sharp, PA-C

## 2019-01-09 ENCOUNTER — Encounter: Payer: Self-pay | Admitting: Cardiovascular Disease

## 2019-01-09 DIAGNOSIS — L900 Lichen sclerosus et atrophicus: Secondary | ICD-10-CM | POA: Diagnosis not present

## 2019-01-12 NOTE — Addendum Note (Signed)
Addended by: Therisa Doyne on: 01/12/2019 04:53 PM   Modules accepted: Orders

## 2019-01-18 ENCOUNTER — Encounter: Payer: Medicare Other | Admitting: Internal Medicine

## 2019-01-25 ENCOUNTER — Other Ambulatory Visit: Payer: Self-pay

## 2019-01-25 ENCOUNTER — Encounter: Payer: Self-pay | Admitting: Internal Medicine

## 2019-01-25 ENCOUNTER — Ambulatory Visit (AMBULATORY_SURGERY_CENTER): Payer: Medicare Other | Admitting: Internal Medicine

## 2019-01-25 VITALS — BP 114/87 | HR 113 | Resp 17

## 2019-01-25 DIAGNOSIS — K529 Noninfective gastroenteritis and colitis, unspecified: Secondary | ICD-10-CM | POA: Diagnosis not present

## 2019-01-25 DIAGNOSIS — K5000 Crohn's disease of small intestine without complications: Secondary | ICD-10-CM | POA: Diagnosis not present

## 2019-01-25 DIAGNOSIS — Z1211 Encounter for screening for malignant neoplasm of colon: Secondary | ICD-10-CM | POA: Diagnosis not present

## 2019-01-25 DIAGNOSIS — R197 Diarrhea, unspecified: Secondary | ICD-10-CM

## 2019-01-25 MED ORDER — SODIUM CHLORIDE 0.9 % IV SOLN: 500.0000 mL | Freq: Once | INTRAVENOUS | Status: DC

## 2019-01-25 NOTE — Progress Notes (Signed)
Epic down for a period of time this a.m.  Pathology put in at this time

## 2019-01-25 NOTE — Progress Notes (Signed)
Computers went down during patients procedure. Admission and other documentation was entered at a later time by recovery RN.

## 2019-01-25 NOTE — Op Note (Signed)
[place] Patient Name: Jade Hernandez Procedure Date: 01/25/2019 9:25 AM MRN: [id number] Endoscopist: Gatha Mayer , MD Age: 78 Referring MD:  Date of Birth: June 25, 1941 Gender: Female Account #: [id number] Procedure:                Colonoscopy Indications:              Clinically significant diarrhea of unexplained                            origin - suspected IBS D Medicines:                Propofol per Anesthesia, Monitored Anesthesia Care Procedure:                Pre-Anesthesia Assessment:                           - Prior to the procedure, a History and Physical                            was performed, and patient medications and                            allergies were reviewed. The patient's tolerance of                            previous anesthesia was also reviewed. The risks                            and benefits of the procedure and the sedation                            options and risks were discussed with the patient.                            All questions were answered, and informed consent                            was obtained. Prior Anticoagulants: The patient has                            taken no previous anticoagulant or antiplatelet                            agents. ASA Grade Assessment: II - A patient with                            mild systemic disease. After reviewing the risks                            and benefits, the patient was deemed in                            satisfactory condition to undergo the procedure.  After obtaining informed consent, the colonoscope                            was passed under direct vision. Throughout the                            procedure, the patient's blood pressure, pulse, and                            oxygen saturations were monitored continuously. The                            Model [id number]-[id number]) scope was introduced                            through the  anus and advanced to the the terminal                            ileum, with identification of the appendiceal                            orifice and IC valve. The colonoscopy was somewhat                            difficult due to significant looping. Successful                            completion of the procedure was aided by applying                            abdominal pressure. The patient tolerated the                            procedure well. The quality of the bowel                            preparation was good. The terminal ileum, ileocecal                            valve, appendiceal orifice, and rectum were                            photographed. Scope In: 9:32:53 AM Scope Out: 9:53:27 AM Scope Withdrawal Time: 0 hours 14 minutes 3 seconds  Total Procedure Duration: 0 hours 20 minutes 34 seconds  Findings:                 Patchy inflammation, mild in severity and                            characterized by erosions and serpentine                            ulcerations was found in the terminal ileum.  Biopsies were taken with a cold forceps for                            histology. Verification of patient identification                            for the specimen was done. Estimated blood loss was                            minimal.                           A diffuse area of mildly congested, erythematous,                            inflamed and ulcerated mucosa was found in the                            entire colon. Biopsies were taken with a cold                            forceps for histology. Verification of patient                            identification for the specimen was done. Estimated                            blood loss was minimal.                           The exam was otherwise without abnormality on                            direct and retroflexion views. Complications:            No immediate complications. Estimated  Blood Loss:     Estimated blood loss was minimal. Impression:               - Ileitis. Biopsied.                           - Congested, erythematous, inflamed and ulcerated                            mucosa in the entire examined colon. Biopsied.                           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 Recommendation:           - Patient has a contact number available for                            emergencies. The signs and symptoms of potential                            delayed complications were discussed with the                            patient. Return to normal activities tomorrow.                            Written discharge instructions were provided to the                            patient.                           - Resume previous diet.                           - Continue present medications.                           - Await pathology results.                           - Repeat colonoscopy is recommended. The                            colonoscopy date will be determined after pathology                            results from today's exam become available for                            review. Gatha Mayer, MD 01/25/2019 10:35:26 AM This report has been signed electronically.

## 2019-01-26 ENCOUNTER — Telehealth: Payer: Self-pay

## 2019-01-26 NOTE — Telephone Encounter (Signed)
  Follow up Call-  Call back number 01/25/2019  Post procedure Call Back phone  # [phone]  Permission to leave phone message Yes  Some recent data might be hidden     Patient questions:  Do you have a fever, pain , or abdominal swelling? No. Pain Score  0 *  Have you tolerated food without any problems? Yes.    Have you been able to return to your normal activities? Yes.    Do you have any questions about your discharge instructions: Diet   No. Medications  No. Follow up visit  No.  Do you have questions or concerns about your Care? No.  Actions: * If pain score is 4 or above: No action needed, pain <4.

## 2019-01-30 ENCOUNTER — Telehealth: Payer: Self-pay | Admitting: Internal Medicine

## 2019-01-30 NOTE — Telephone Encounter (Signed)
Patient notified that as soon as Dr. Carlean Purl has the opportunity to review we will call or send a letter

## 2019-01-30 NOTE — Telephone Encounter (Signed)
Pt call and stated that he doctor was to call her back to prescribe some medication. She do not know what medication but she is needing to speak with a nurse because she has not recv a called

## 2019-01-31 ENCOUNTER — Telehealth: Payer: Self-pay | Admitting: Internal Medicine

## 2019-01-31 MED ORDER — BUDESONIDE 3 MG PO CPEP: 9.0000 mg | ORAL_CAPSULE | Freq: Every day | ORAL | 1 refills | Status: DC

## 2019-01-31 NOTE — Telephone Encounter (Signed)
Results of colon bxs explained Will try to treat w/ budesonide She takes meloxicam - so ? If that is playing a role  If budesonide too expensive then consider prednisone taper Sulfasalazine is another possible option  She is having some red nodules on shins - not clearly e nodosum when I saw them but possible I suppose

## 2019-01-31 NOTE — Progress Notes (Signed)
Results called to patient Will treat with budesonide and plan f/u in 2-3 months   See phone note also  No letter or recall

## 2019-02-01 NOTE — Telephone Encounter (Signed)
Please advise Sir? 

## 2019-02-01 NOTE — Telephone Encounter (Signed)
Pt called to inform that budesonide was too expensive and requested an alternative.

## 2019-02-02 NOTE — Telephone Encounter (Signed)
Tell her there are some other options to try but I need her to do a CBC before I can try so please have her do that  Dx is crohn's ileocolitis

## 2019-02-02 NOTE — Telephone Encounter (Signed)
I called to tell her to come get the lab work and she said her son told her to get the budesonide and he went and picked it up for her. It was $76.00. She will try it and let us know if she has any problems.

## 2019-02-12 ENCOUNTER — Telehealth: Payer: Self-pay | Admitting: Internal Medicine

## 2019-02-12 NOTE — Telephone Encounter (Signed)
I spoke with her and she will refill her bottle and let us know before she runs out about her current symptoms. I also wished her a Happy Birthday! It's today.

## 2019-02-12 NOTE — Telephone Encounter (Signed)
There is 1 refill and I want her to give [place] an update as the her sxs or a "visit" at about 6-8 weeks from starting the med and will decide what is next after that

## 2019-02-12 NOTE — Telephone Encounter (Signed)
Pt called in wanting to know if she has to refill the medication once she has finished the bottle that she is taking now.

## 2019-02-12 NOTE — Telephone Encounter (Signed)
How many refills do you want to approve Sir?

## 2019-02-13 NOTE — Telephone Encounter (Signed)
Please advise Sir, thank you. 

## 2019-02-13 NOTE — Telephone Encounter (Signed)
Ask her to give that some time - not something I would expect to occur from the medication If it persists into next week or worsens before call back

## 2019-02-13 NOTE — Telephone Encounter (Signed)
Patient thought it could be from her food and said her food is spicy but not that spicy. She will keep an eye on things and call us back if needed. I informed her as Dr Carlean Purl advised.

## 2019-02-13 NOTE — Telephone Encounter (Signed)
Pt said that she is taking the medicine and when she uses the bathroom it burns her.

## 2019-03-14 ENCOUNTER — Ambulatory Visit: Payer: Self-pay

## 2019-03-14 NOTE — Telephone Encounter (Signed)
Patient's husband called and says a glass fell on the patient's right ankle and cut it. The wife was given the phone and she says it happened about 30 minutes ago, they cleaned it with water and alcohol, then put a bandage on it. She says it bled a little, but is not bleeding now. She asks is it ok to apply neosporin on it. I asked how large is the cut, she says just a small cut. Home care advice given, advised to call PCP with any other concerns or if it's not healing, she verbalized understanding.   Reason for Disposition . Minor cut or scratch  Answer Assessment - Initial Assessment Questions 1. APPEARANCE of INJURY: "What does the injury look like?"      Looks okay, not bleeding 2. SIZE: "How large is the cut?"      Little slit 3. BLEEDING: "Is it bleeding now?" If so, ask: "Is it difficult to stop?"      No 4. LOCATION: "Where is the injury located?"      Right ankle above the foot 5. ONSET: "How long ago did the injury occur?"      30 minutes ago 6. MECHANISM: "Tell me how it happened."      A glass fell on it 7. TETANUS: "When was the last tetanus booster?"     Unknown 8. PREGNANCY: "Is there any chance you are pregnant?" "When was your last menstrual period?"     No  Protocols used: [place]

## 2019-03-27 ENCOUNTER — Telehealth: Payer: Self-pay | Admitting: Internal Medicine

## 2019-03-27 NOTE — Telephone Encounter (Signed)
Patient was advised to call one week before her medication udesonide (ENTOCORT EC) is almost done. She is calling to know what will the next step be.

## 2019-03-27 NOTE — Telephone Encounter (Signed)
Patient reports that her diarrhea has resolved.  She is currently on 9 mg of budesonide a day. Please advise on how to begin her wean.

## 2019-03-27 NOTE — Telephone Encounter (Signed)
I spoke to her Has been well for some time  She will finish current supply of budesonide and observe and call me back if recurrent problems

## 2019-05-21 ENCOUNTER — Telehealth: Payer: Self-pay | Admitting: Internal Medicine

## 2019-05-21 NOTE — Telephone Encounter (Signed)
Pt states that she has been having diarrhea since this morning, she has budisonide that has never taken so wants to know if that would help. Pls call her.

## 2019-05-21 NOTE — Telephone Encounter (Signed)
Patient has dicyclomine at home not budesonide.  She is advised that she can try for diarrhea and cramping.  She will call back if this fails to improve her symptoms.

## 2019-06-04 DIAGNOSIS — E782 Mixed hyperlipidemia: Secondary | ICD-10-CM | POA: Diagnosis not present

## 2019-06-04 DIAGNOSIS — I10 Essential (primary) hypertension: Secondary | ICD-10-CM | POA: Diagnosis not present

## 2019-06-08 DIAGNOSIS — K589 Irritable bowel syndrome without diarrhea: Secondary | ICD-10-CM | POA: Diagnosis not present

## 2019-06-08 DIAGNOSIS — Z7189 Other specified counseling: Secondary | ICD-10-CM | POA: Diagnosis not present

## 2019-06-08 DIAGNOSIS — M1990 Unspecified osteoarthritis, unspecified site: Secondary | ICD-10-CM | POA: Diagnosis not present

## 2019-06-08 DIAGNOSIS — E782 Mixed hyperlipidemia: Secondary | ICD-10-CM | POA: Diagnosis not present

## 2019-06-08 DIAGNOSIS — I10 Essential (primary) hypertension: Secondary | ICD-10-CM | POA: Diagnosis not present

## 2019-06-13 DIAGNOSIS — H6123 Impacted cerumen, bilateral: Secondary | ICD-10-CM | POA: Diagnosis not present

## 2019-06-21 DIAGNOSIS — Z471 Aftercare following joint replacement surgery: Secondary | ICD-10-CM | POA: Diagnosis not present

## 2019-06-21 DIAGNOSIS — Z96652 Presence of left artificial knee joint: Secondary | ICD-10-CM | POA: Diagnosis not present

## 2019-06-21 DIAGNOSIS — M1711 Unilateral primary osteoarthritis, right knee: Secondary | ICD-10-CM | POA: Diagnosis not present

## 2019-06-27 ENCOUNTER — Telehealth: Payer: Self-pay | Admitting: Internal Medicine

## 2019-06-27 NOTE — Telephone Encounter (Signed)
Patient notified that she can use the imodium PRN.  She will call back for any additional questions or concerns.

## 2019-07-12 ENCOUNTER — Telehealth: Payer: Self-pay | Admitting: Internal Medicine

## 2019-07-12 DIAGNOSIS — K529 Noninfective gastroenteritis and colitis, unspecified: Secondary | ICD-10-CM

## 2019-07-12 NOTE — Telephone Encounter (Signed)
Discussed arranging f/u Finished budesonide  Using Imodium and dicyclomine  Needs f/u visit

## 2019-07-17 ENCOUNTER — Encounter: Payer: Self-pay | Admitting: Internal Medicine

## 2019-07-17 DIAGNOSIS — Z124 Encounter for screening for malignant neoplasm of cervix: Secondary | ICD-10-CM | POA: Diagnosis not present

## 2019-07-17 DIAGNOSIS — Z6832 Body mass index (BMI) 32.0-32.9, adult: Secondary | ICD-10-CM | POA: Diagnosis not present

## 2019-07-17 DIAGNOSIS — L900 Lichen sclerosus et atrophicus: Secondary | ICD-10-CM | POA: Diagnosis not present

## 2019-07-17 DIAGNOSIS — K529 Noninfective gastroenteritis and colitis, unspecified: Secondary | ICD-10-CM | POA: Insufficient documentation

## 2019-07-17 HISTORY — DX: Noninfective gastroenteritis and colitis, unspecified: K52.9

## 2019-07-17 NOTE — Telephone Encounter (Signed)
She is doing ok - toolk budesonide 9 mg/day x 2 months with good control of diarrhea - now occasional loperamide Satisfied w/ quality of life I told her to call back if diarrhea is a problem again and we will reassess/retreat

## 2019-07-24 DIAGNOSIS — Z1231 Encounter for screening mammogram for malignant neoplasm of breast: Secondary | ICD-10-CM | POA: Diagnosis not present

## 2019-08-18 DIAGNOSIS — Z23 Encounter for immunization: Secondary | ICD-10-CM | POA: Diagnosis not present

## 2019-09-12 ENCOUNTER — Other Ambulatory Visit: Payer: Self-pay | Admitting: Cardiovascular Disease

## 2019-09-13 ENCOUNTER — Other Ambulatory Visit: Payer: Self-pay | Admitting: Cardiovascular Disease

## 2019-09-13 NOTE — Telephone Encounter (Signed)
 *  STAT* If patient is at the pharmacy, call can be transferred to refill team.   1. Which medications need to be refilled? (please list name of each medication and dose if known) metoprolol succinate (TOPROL-XL) 50 MG 24 hr tablet  2. Which pharmacy/location (including street and city if local pharmacy) is medication to be sent to? [place], [place]  3. Do they need a 30 day or 90 day supply? [place]

## 2019-09-13 NOTE — Telephone Encounter (Signed)
Rx has already been sent to requesting pharmacy.

## 2019-09-14 ENCOUNTER — Other Ambulatory Visit: Payer: Self-pay | Admitting: Cardiovascular Disease

## 2019-09-14 MED ORDER — METOPROLOL SUCCINATE ER 50 MG PO TB24: ORAL_TABLET | ORAL | 0 refills | Status: DC

## 2019-09-14 NOTE — Telephone Encounter (Signed)
New message    *STAT* If patient is at the pharmacy, call can be transferred to refill team.   1. Which medications need to be refilled? (please list name of each medication and dose if known) metoprolol succinate (TOPROL-XL) 50 MG 24 hr tablet  2. Which pharmacy/location (including street and city if local pharmacy) is medication to be sent to?[id number] - [place], [place] [address]  3. Do they need a 30 day or 90 day supply?[place]

## 2019-09-14 NOTE — Telephone Encounter (Signed)
Requested Prescriptions   Signed Prescriptions Disp Refills  . metoprolol succinate (TOPROL-XL) 50 MG 24 hr tablet 135 tablet 0    Sig: Take 1.5 tablet (75 mg) by mouth daily.    Authorizing Provider: Troy Sine    Ordering User: Raelene Bott, Shawnay Bramel L

## 2019-11-30 DIAGNOSIS — E669 Obesity, unspecified: Secondary | ICD-10-CM | POA: Diagnosis not present

## 2019-11-30 DIAGNOSIS — I10 Essential (primary) hypertension: Secondary | ICD-10-CM | POA: Diagnosis not present

## 2019-11-30 DIAGNOSIS — M899 Disorder of bone, unspecified: Secondary | ICD-10-CM | POA: Diagnosis not present

## 2019-11-30 DIAGNOSIS — Z6833 Body mass index (BMI) 33.0-33.9, adult: Secondary | ICD-10-CM | POA: Diagnosis not present

## 2019-11-30 DIAGNOSIS — Z0000 Encounter for general adult medical examination without abnormal findings: Secondary | ICD-10-CM | POA: Diagnosis not present

## 2019-11-30 DIAGNOSIS — E782 Mixed hyperlipidemia: Secondary | ICD-10-CM | POA: Diagnosis not present

## 2019-11-30 DIAGNOSIS — Z7189 Other specified counseling: Secondary | ICD-10-CM | POA: Diagnosis not present

## 2019-11-30 DIAGNOSIS — M1990 Unspecified osteoarthritis, unspecified site: Secondary | ICD-10-CM | POA: Diagnosis not present

## 2019-12-10 DIAGNOSIS — E782 Mixed hyperlipidemia: Secondary | ICD-10-CM | POA: Diagnosis not present

## 2019-12-10 DIAGNOSIS — I10 Essential (primary) hypertension: Secondary | ICD-10-CM | POA: Diagnosis not present

## 2020-01-01 ENCOUNTER — Other Ambulatory Visit: Payer: Self-pay | Admitting: Cardiovascular Disease

## 2020-01-11 ENCOUNTER — Other Ambulatory Visit: Payer: Self-pay

## 2020-01-11 ENCOUNTER — Ambulatory Visit (INDEPENDENT_AMBULATORY_CARE_PROVIDER_SITE_OTHER): Payer: Medicare Other | Admitting: Cardiovascular Disease

## 2020-01-11 ENCOUNTER — Encounter: Payer: Self-pay | Admitting: Cardiovascular Disease

## 2020-01-11 DIAGNOSIS — E7800 Pure hypercholesterolemia, unspecified: Secondary | ICD-10-CM

## 2020-01-11 DIAGNOSIS — R002 Palpitations: Secondary | ICD-10-CM

## 2020-01-11 DIAGNOSIS — E669 Obesity, unspecified: Secondary | ICD-10-CM

## 2020-01-11 DIAGNOSIS — I10 Essential (primary) hypertension: Secondary | ICD-10-CM

## 2020-01-11 NOTE — Patient Instructions (Signed)

## 2020-01-11 NOTE — Progress Notes (Addendum)
Patient ID: Jade Hernandez, female   DOB: 1941-05-03, 79 y.o.   MRN: [id number]     PCP: Dr. [name]  HPI: Jade Hernandez is a 79 y.o. female who presents to the office for a 12 month cardiology evaluation.  She is the sister of my patient Jade Hernandez.  Ms. Karwowski has a strong family history for premature coronary disease and has a history of hypertension, palpitations, and hyperlipidemia. In December 2011 an ECHO Doppler study showed proximal septal thickening without outflow tract obstruction, normal LV systolic function but grade 1 diastolic dysfunction. There was mild LA dilatation, mild MAC with mild MR, TR and mild aortic valve sclerosis with mild AR. A nuclear perfusion study in 2008 revealed normal perfusion. Recently, she has noticed that her blood pressure has been tending to stay in the range of 140. She denies any chest tightness. She denies dyspnea. He denies palpitations. She presents for evaluation.  In August 2015 a F/U echo revealed normal renal function with a BUN of 14, creatine 0.43.  Fasting glucose was 85.  LFTs were normal.  Total cholesterol was 165, HDL 52, triglycerides 166, and LDL cholesterol 80.  When I saw her in 2016 she was doing well on a medical regimen with simvastatin 40 mg for hyperlipidemia. She takes Toprol-XL 75 mg and HCTZ 25 mg for palpitations and hypertension. She was on Prilosec for GERD.  She also is bothered by right knee pain for which she takes meloxicam 15 mg on an as-needed basis.     I last saw her in February 2020 after not having seen her in over 2-1/2 years.  She had undergone left total knee arthroplasty in April 2018 and tolerated this well.  Since I last saw her, she was advised by her primary physician to reduce her Toprol dose down to 50 mg.  Subsequently, she noticed that her pulse is faster.  She also admits to occasional episodes of palpitations.  She  had issues with diarrhea and abdominal cramping and has been evaluated  by GI and is felt to have irritable bowel syndrome.  She will be undergoing a repeat colonoscopy in several weeks.  She denied chest pain PND orthopnea.  Since I last saw her, she has remained cardiac stable.  She sees Dr. Serita Grammes for primary care.  She had her Covid vaccines.  She continues to have right knee discomfort and remotely had undergone left knee replacement.  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  She had undergone recent laboratory by Dr. Brigitte Pulse which showed a total cholesterol 140, HDL 50, LDL 66 and triglycerides 140.  She has continued to be on simvastatin.  She believes her blood pressure has been stable on Toprol-XL in addition to hydrochlorothiazide.  She presents for yearly evaluation.   Past Medical History:  Diagnosis Date  . Adenomatous colon polyp   . Arthritis   . Diverticulosis   . High cholesterol   . Hypertension   . IBS (irritable bowel syndrome)   . Noninfectious ileitis and colitis 07/17/2019  . Palpitations   . UTI (urinary tract infection)     Past Surgical History:  Procedure Laterality Date  . BILATERAL CARPAL TUNNEL RELEASE    . COLONOSCOPY    . NM MYOCAR PERF WALL MOTION  10/02/2007   No significant ischemia demonstrated  . TOTAL KNEE ARTHROPLASTY Left 02/21/2017   Procedure: LEFT TOTAL KNEE ARTHROPLASTY;  Surgeon:  Gaynelle Arabian, MD;  Location: [facility];  Service: Orthopedics;  Laterality: Left;  requests 56mns; Adductor Block  . UKoreaECHOCARDIOGRAPHY  10/29/2010   Proximal septal thickening noted, EF =>55%,LA mildly dilated,mild mitral annular ca+, AOV mildly sclerotic, mild AI.    No Known Allergies  Current Outpatient Medications  Medication Sig Dispense Refill  . acetaminophen (TYLENOL) 325 MG tablet Take 325-487.5 mg by mouth every 6 (six) hours as needed (for knee pain.).    .Marland Kitchenaspirin EC 81 MG tablet Take 81 mg by mouth daily.    . hydrochlorothiazide (HYDRODIURIL) 25 MG  tablet Take 25 mg by mouth. 1-2 times weekly    . meloxicam (MOBIC) 15 MG tablet Take 15 mg by mouth daily.    . metoprolol succinate (TOPROL-XL) 50 MG 24 hr tablet TAKE 1 AND 1/2 TABLETS(75 MG) BY MOUTH DAILY 135 tablet 0  . omeprazole (PRILOSEC) 20 MG capsule Take 20 mg by mouth as needed.    . Probiotic Product (PROBIOTIC-10 PO) Take by mouth daily.    . simvastatin (ZOCOR) 40 MG tablet Take 40 mg by mouth daily.     . budesonide (ENTOCORT EC) 3 MG 24 hr capsule Take 3 capsules (9 mg total) by mouth daily. (Patient not taking: Reported on 01/11/2020) 90 capsule 1   No current facility-administered medications for this visit.    Social History   Socioeconomic History  . Marital status: Married    Spouse name: Not on file  . Number of children: Not on file  . Years of education: Not on file  . Highest education level: Not on file  Occupational History  . Occupation: semi-retired  Tobacco Use  . Smoking status: Never Smoker  . Smokeless tobacco: Never Used  Substance and Sexual Activity  . Alcohol use: Yes    Alcohol/week: 1.0 standard drinks    Types: 1 Glasses of wine per week    Comment: occasional  . Drug use: No  . Sexual activity: Not on file  Other Topics Concern  . Not on file  Social History Narrative   The [place]   Family owns [employer]  She does have children   Rare alcohol no tobacco or drug use   Social Determinants of HRadio broadcast assistantStrain:   . Difficulty of Paying Living Expenses: Not on file  Food Insecurity:   . Worried About RCharity fundraiserin the Last Year: Not on file  . Ran Out of Food in the Last Year: Not on file  Transportation Needs:   . Lack of Transportation (Medical): Not on file  . Lack of Transportation (Non-Medical): Not on file  Physical Activity:   . Days of Exercise per Week: Not on file  . Minutes of Exercise per Session: Not on file  Stress:    . Feeling of Stress : Not on file  Social Connections:   . Frequency of Communication with Friends and Family: Not on file  . Frequency of Social Gatherings with Friends and Family: Not on file  . Attends Religious Services: Not on file  . Active Member of Clubs or Organizations: Not on file  . Attends CArchivistMeetings: Not on file  . Marital Status: Not on file  Intimate Partner Violence:   . Fear of Current or Ex-Partner: Not on file  . Emotionally Abused: Not on file  . Physically Abused: Not on file  .  Sexually Abused: Not on file   She is married and has 4 children 9 grandchildren. Her brother is my patient Mr. Reather Converse.  She states she cannot walk due to knee discomfort and often times tries to march in place for exercise.  Family History  Problem Relation Age of Onset  . Hypertension Mother   . Diabetes Mother   . Heart disease Father        died from heart attack  . Heart disease Brother   . Colon cancer Neg Hx   . Rectal cancer Neg Hx   . Liver cancer Neg Hx   . Stomach cancer Neg Hx    ROS General: Negative; No fevers, chills, or night sweats; no success with weight loss HEENT: Negative; No changes in vision or hearing, sinus congestion, difficulty swallowing Pulmonary: Negative; No cough, wheezing, shortness of breath, hemoptysis Cardiovascular: See HPI GI: Negative; No nausea, vomiting, diarrhea, or abdominal pain GU: Irritable bowel syndrome Musculoskeletal: Positive for right knee discomfort; status post left knee arthroplasty Hematologic/Oncology: Negative; no easy bruising, bleeding Endocrine: Negative; no heat/cold intolerance; no diabetes Neuro: Negative; no changes in balance, headaches Skin: Negative; No rashes or skin lesions Psychiatric: Negative; No behavioral problems, depression Sleep: Negative; No snoring, daytime sleepiness, hypersomnolence, bruxism, restless legs, hypnogognic hallucinations, no cataplexy Other comprehensive 14  point system review is negative.  PE BP (!) 154/78   Pulse 70   Temp (!) 97.2 F (36.2 C)   Ht _0  (1.626 m)   Wt 186 lb (84.4 kg)   SpO2 99%   BMI 31.93 kg/m    Repeat blood pressure by me was 124/70  Wt Readings from Last 3 Encounters:  01/11/20 186 lb (84.4 kg)  01/08/19 173 lb (78.5 kg)  12/20/18 176 lb (79.8 kg)   General: Alert, oriented, no distress.  Skin: normal turgor, no rashes, warm and dry HEENT: Normocephalic, atraumatic. Pupils equal round and reactive to light; sclera anicteric; extraocular muscles intact; Nose without nasal septal hypertrophy Mouth/Parynx benign; Mallinpatti scale 3 Neck: No JVD, no carotid bruits; normal carotid upstroke Lungs: clear to ausculatation and percussion; no wheezing or rales Chest wall: without tenderness to palpitation Heart: PMI not displaced, RRR, s1 s2 normal, 1/6 systolic murmur, no diastolic murmur, no rubs, gallops, thrills, or heaves Abdomen: soft, nontender; no hepatosplenomehaly, BS+; abdominal aorta nontender and not dilated by palpation. Back: no CVA tenderness Pulses 2+ Musculoskeletal: full range of motion, normal strength, no joint deformities Extremities: no clubbing cyanosis or edema, Homan's sign negative  Neurologic: grossly nonfocal; Cranial nerves grossly wnl Psychologic: Normal mood and affect  ECG (independently read by me): Normal sinus rhythm at 70 bpm.  No ectopy.  Normal intervals.  Early transition.  February 2020 ECG (independently read by me): NSR at 81; easrly transition, normal intervals  September 2017 ECG (independently read by me): Normal sinus rhythm at 73 bpm.  No ectopy.  Normal intervals.  June 2016 ECG (independently read by me): Normal sinus rhythm at 72 bpm.  Early transition.  No ectopy.  QTC 462 ms.  February 2015 ECG (independently read by me): Normal sinus rhythm at 69 beats per minute. Mild RV conduction delay. No ectopy.  LABS: BMP Latest Ref Rng & Units 02/23/2017 02/22/2017  02/15/2017  Glucose 65 - 99 mg/dL 107(H) 145(H) 89  BUN 6 - 20 mg/dL _1 Creatinine 0.44 - 1.00 mg/dL 0.53 0.47 0.44  Sodium 135 - 145 mmol/L 139 138 139  Potassium 3.5 -  5.1 mmol/L 3.3(L) 3.6 3.7  Chloride 101 - 111 mmol/L 102 109 105  CO2 22 - 32 mmol/L _0 Calcium 8.9 - 10.3 mg/dL 8.9 8.3(L) 9.3    Hepatic Function Latest Ref Rng & Units 02/15/2017 06/17/2012  Total Protein 6.5 - 8.1 g/dL 6.6 6.7  Albumin 3.5 - 5.0 g/dL 3.8 3.5  AST 15 - 41 U/L 23 16  ALT 14 - 54 U/L 18 12  Alk Phosphatase 38 - 126 U/L 49 48  Total Bilirubin 0.3 - 1.2 mg/dL 1.1 0.5   CBC Latest Ref Rng & Units 02/24/2017 02/23/2017 02/22/2017  WBC 4.0 - 10.5 K/uL 7.5 9.1 9.4  Hemoglobin 12.0 - 15.0 g/dL 10.5(L) 11.7(L) 10.4(L)  Hematocrit 36.0 - 46.0 % 32.0(L) 35.7(L) 31.6(L)  Platelets 150 - 400 K/uL 165 180 166   No results found for: TSH  Lipid Panel  No results found for: CHOL, TRIG, HDL, CHOLHDL, VLDL, LDLCALC, LDLDIRECT  RADIOLOGY: No results found.  IMPRESSION: 1. Essential hypertension   2. Palpitations   3. Pure hypercholesterolemia   4. Mild obesity      ASSESSMENT AND PLAN: Ms Haymaker is a very pleasant 79 year old female who has a history of hypertension, palpitations, hyperlipidemia.  When I had seen her previously, she had stabilized nicely with reference to palpitations and blood pressure on a regimen consisting of Toprol-XL 75 mg in addition to HCTZ.  Her dose of metoprolol succinate had been reduced by her primary physician in the past which led to increase sensation of fast heart rate.  Presently, her blood pressure is excellent on her regimen of 50 alternating with 75 mg in addition to HCTZ which he takes of times a week.  Repeat blood pressure by me today was excellent at 124/78.  She denies any chest pain or any exertional dyspnea symptoms.  Her mobility is somewhat limited by her knee discomfort.  She had undergone left knee replacement remotely and has issues with her right knee  presently.  She continues to be on simvastatin 40 mg.  Most recent laboratory was reviewed which reveals an LDL cholesterol at 66 on current therapy.  Renal function is stable with a BUN of 13 creatinine 0.44.  She is mildly obese with a BMI of 31.9.  Weight loss was recommended to try to get below the obesity threshold.  I discussed current Heart Association recommendations of exercising at least 5 days/week for 30 minutes.  She will follow-up with her primary physician.  As long as she is stable I will see her in 1 year for reevaluation.  Troy Sine, MD, [facility]  01/13/2020 4:41 PM

## 2020-01-13 ENCOUNTER — Encounter: Payer: Self-pay | Admitting: Cardiovascular Disease

## 2020-02-11 DIAGNOSIS — Z780 Asymptomatic menopausal state: Secondary | ICD-10-CM | POA: Diagnosis not present

## 2020-02-11 DIAGNOSIS — M85852 Other specified disorders of bone density and structure, left thigh: Secondary | ICD-10-CM | POA: Diagnosis not present

## 2020-02-11 DIAGNOSIS — M85851 Other specified disorders of bone density and structure, right thigh: Secondary | ICD-10-CM | POA: Diagnosis not present

## 2020-02-20 DIAGNOSIS — H25813 Combined forms of age-related cataract, bilateral: Secondary | ICD-10-CM | POA: Diagnosis not present

## 2020-02-20 DIAGNOSIS — H52203 Unspecified astigmatism, bilateral: Secondary | ICD-10-CM | POA: Diagnosis not present

## 2020-04-13 ENCOUNTER — Other Ambulatory Visit: Payer: Self-pay | Admitting: Cardiovascular Disease

## 2020-04-21 ENCOUNTER — Other Ambulatory Visit: Payer: Self-pay | Admitting: Family Medicine

## 2020-04-21 DIAGNOSIS — R519 Headache, unspecified: Secondary | ICD-10-CM

## 2020-04-22 ENCOUNTER — Ambulatory Visit
Admission: RE | Admit: 2020-04-22 | Discharge: 2020-04-22 | Disposition: A | Discharge status: Home / Self Care | Payer: Medicare Other | Source: Ambulatory Visit | Attending: Family Medicine | Admitting: Family Medicine

## 2020-04-22 DIAGNOSIS — R519 Headache, unspecified: Secondary | ICD-10-CM

## 2020-04-22 MED ORDER — IOPAMIDOL (ISOVUE-300) INJECTION 61%
75.0000 mL | Freq: Once | INTRAVENOUS | Status: AC | PRN
  Administered 2020-04-22: 75 mL via INTRAVENOUS

## 2020-04-23 ENCOUNTER — Other Ambulatory Visit: Payer: Self-pay | Admitting: Family Medicine

## 2020-04-23 DIAGNOSIS — R519 Headache, unspecified: Secondary | ICD-10-CM

## 2020-04-23 DIAGNOSIS — G8929 Other chronic pain: Secondary | ICD-10-CM

## 2020-05-29 ENCOUNTER — Telehealth: Payer: Self-pay | Admitting: Internal Medicine

## 2020-05-29 NOTE — Telephone Encounter (Signed)
Left message for patient to call back  

## 2020-05-29 NOTE — Telephone Encounter (Signed)
The last time we used budesonide 3 mg caps at 9 mg a day for a month and that worked  She balked some at the price but then purchased it   I recommend that she try that for a month assuming no recent antibiotics (past 3 mos) in which case would do a C diff test

## 2020-05-29 NOTE — Telephone Encounter (Signed)
Patient reports that she is having diarrhea for about a week to a week and a half .  She has been having loose stools once or twice a day and has been using imodium.  The imodium helps control it .  She is asking if you have any additional recommendations?

## 2020-05-30 MED ORDER — BUDESONIDE 3 MG PO CPEP: 9.0000 mg | ORAL_CAPSULE | Freq: Every day | ORAL | 1 refills | Status: DC

## 2020-05-30 NOTE — Telephone Encounter (Signed)
Patient notified No recent antibiotics rx sent

## 2020-05-30 NOTE — Addendum Note (Signed)
Addended by: Marlon Pel on: 05/30/2020 10:21 AM   Modules accepted: Orders

## 2020-06-04 ENCOUNTER — Telehealth: Payer: Self-pay | Admitting: Internal Medicine

## 2020-06-04 NOTE — Telephone Encounter (Signed)
Patient reports that she has only had one stool today, but some cramping. She has only been on the budesonide for 5 days.  She will try dicyclomine for a few more days.  She will call back on Friday if she has not improved

## 2020-06-04 NOTE — Telephone Encounter (Signed)
Left message for patient to call back  

## 2020-06-04 NOTE — Telephone Encounter (Signed)
Lm on vm for patient to return call 

## 2020-06-04 NOTE — Telephone Encounter (Signed)
Patient has questions regarding medication for diarrhea

## 2020-06-05 NOTE — Telephone Encounter (Signed)
Dr. Carlean Purl do you have additional recommendations?  See notes below

## 2020-06-05 NOTE — Telephone Encounter (Signed)
I called her  1-2 loose stools a day  Explained that it is too soon to say budesonide is not working  She is under stress - husband recently dxed with cancer  Could be IBS and not colitis but recommended that she continue budesonide for longer and then let us know

## 2020-06-30 ENCOUNTER — Telehealth: Payer: Self-pay | Admitting: Internal Medicine

## 2020-06-30 NOTE — Telephone Encounter (Signed)
I spoke with Mrs. Suchan and she finished her budesonide yesterday. She has had 2 bowel movements a day, not loose. She said she is tired and has weight loss of 7-8 pounds in past several months. Under stress with husbands dx of cancer. Today she noticed she has a fever of 99.8. She is taking Imodium and dicyclomine. She wants to know if you want her to take more budesonide ? Imodium? And dicyclomine? She will need a refill on the budesonide if to continue.

## 2020-06-30 NOTE — Telephone Encounter (Signed)
Pt is wanting to know if provider Carlean Purl would still like for her to continue taking budesonide.

## 2020-06-30 NOTE — Telephone Encounter (Signed)
Stop Budesonide See if she will make an appointment for about 1 month from now  Also ask her to update is re: weight and symptoms in 1 week and call back sooner prn

## 2020-06-30 NOTE — Telephone Encounter (Signed)
OK 

## 2020-06-30 NOTE — Telephone Encounter (Signed)
Patient informed. 

## 2020-06-30 NOTE — Telephone Encounter (Signed)
She has made an appointment for 07/28/2020. Is it okay for her to take the Imodium and dicyclomine Sir?

## 2020-07-01 DIAGNOSIS — R05 Cough: Secondary | ICD-10-CM | POA: Diagnosis not present

## 2020-07-03 DIAGNOSIS — R109 Unspecified abdominal pain: Secondary | ICD-10-CM | POA: Diagnosis not present

## 2020-07-03 DIAGNOSIS — R195 Other fecal abnormalities: Secondary | ICD-10-CM | POA: Diagnosis not present

## 2020-07-03 DIAGNOSIS — R634 Abnormal weight loss: Secondary | ICD-10-CM | POA: Diagnosis not present

## 2020-07-03 DIAGNOSIS — R8290 Unspecified abnormal findings in urine: Secondary | ICD-10-CM | POA: Diagnosis not present

## 2020-07-07 ENCOUNTER — Telehealth: Payer: Self-pay | Admitting: Internal Medicine

## 2020-07-07 DIAGNOSIS — R1084 Generalized abdominal pain: Secondary | ICD-10-CM

## 2020-07-07 DIAGNOSIS — R197 Diarrhea, unspecified: Secondary | ICD-10-CM

## 2020-07-07 DIAGNOSIS — R634 Abnormal weight loss: Secondary | ICD-10-CM

## 2020-07-07 NOTE — Telephone Encounter (Signed)
I left her a message to call me back. The labs have been ordered.

## 2020-07-07 NOTE — Telephone Encounter (Signed)
Probiotics are fine  I would like her to do labs and some stool tests to check things  CBC, CMET, TSH, lipase and amylase (dx generalized abd pain, weight loss)  Stool calprotectin and fecal elastase - due to diarrhea - will help me understand if inflammation or malabsorption issues

## 2020-07-07 NOTE — Telephone Encounter (Signed)
Update: 2-3 Bowel movements a day, did have 4 yesterday. Pieces not runny diarrhea. Some cramping but not really bad. She finished her Budesonide last week.  Saw per PCP last week and they told her to stop the Imodium if no diarrhea.  She is taking a probiotic from [employer]-one twice daily. Is this okay with you?  Also can she drink protein drinks Sir?   She has a f/u appointment 07/28/2020. She wants to discuss other meds that her niece takes for her UC.

## 2020-07-08 ENCOUNTER — Other Ambulatory Visit (INDEPENDENT_AMBULATORY_CARE_PROVIDER_SITE_OTHER): Payer: Medicare Other

## 2020-07-08 DIAGNOSIS — R197 Diarrhea, unspecified: Secondary | ICD-10-CM

## 2020-07-08 DIAGNOSIS — R634 Abnormal weight loss: Secondary | ICD-10-CM

## 2020-07-08 DIAGNOSIS — R1084 Generalized abdominal pain: Secondary | ICD-10-CM

## 2020-07-08 DIAGNOSIS — D509 Iron deficiency anemia, unspecified: Secondary | ICD-10-CM | POA: Diagnosis not present

## 2020-07-08 LAB — COMPREHENSIVE METABOLIC PANEL
ALT: 8 U/L (ref 0–35)
AST: 11 U/L (ref 0–37)
Albumin: 2.9 g/dL — ABNORMAL LOW (ref 3.5–5.2)
Alkaline Phosphatase: 60 U/L (ref 39–117)
BUN: 9 mg/dL (ref 6–23)
CO2: 29 mEq/L (ref 19–32)
Calcium: 8.6 mg/dL (ref 8.4–10.5)
Chloride: 100 mEq/L (ref 96–112)
Creatinine, Ser: 0.71 mg/dL (ref 0.40–1.20)
GFR: 79.74 mL/min (ref >60.00)
Glucose, Bld: 129 mg/dL — ABNORMAL HIGH (ref 70–99)
Potassium: 3.7 mEq/L (ref 3.5–5.1)
Sodium: 136 mEq/L (ref 135–145)
Total Bilirubin: 0.7 mg/dL (ref 0.2–1.2)
Total Protein: 6.5 g/dL (ref 6.0–8.3)

## 2020-07-08 LAB — AMYLASE: Amylase: 19 U/L — ABNORMAL LOW (ref 27–131)

## 2020-07-08 LAB — CBC WITH DIFFERENTIAL/PLATELET
Basophils Absolute: 0.1 10*3/uL (ref 0.0–0.1)
Basophils Relative: 0.9 % (ref 0.0–3.0)
Eosinophils Absolute: 0.1 10*3/uL (ref 0.0–0.7)
Eosinophils Relative: 2.1 % (ref 0.0–5.0)
HCT: 34.8 % — ABNORMAL LOW (ref 36.0–46.0)
Hemoglobin: 11.3 g/dL — ABNORMAL LOW (ref 12.0–15.0)
Lymphocytes Relative: 16 % (ref 12.0–46.0)
Lymphs Abs: 1.1 10*3/uL (ref 0.7–4.0)
MCHC: 32.4 g/dL (ref 30.0–36.0)
MCV: 76 fl — ABNORMAL LOW (ref 78.0–100.0)
Monocytes Absolute: 0.8 10*3/uL (ref 0.1–1.0)
Monocytes Relative: 11.5 % (ref 3.0–12.0)
Neutro Abs: 4.7 10*3/uL (ref 1.4–7.7)
Neutrophils Relative %: 69.5 % (ref 43.0–77.0)
Platelets: 372 10*3/uL (ref 150.0–400.0)
RBC: 4.58 Mil/uL (ref 3.87–5.11)
RDW: 15.3 % (ref 11.5–15.5)
WBC: 6.7 10*3/uL (ref 4.0–10.5)

## 2020-07-08 LAB — LIPASE: Lipase: 18 U/L (ref 11.0–59.0)

## 2020-07-08 LAB — TSH: TSH: 2.86 u[IU]/mL (ref 0.35–4.50)

## 2020-07-08 NOTE — Telephone Encounter (Signed)
Patient had a CBC and CMET done thru her PCP on 07/03/2020. I will forward these results to Dr Carlean Purl.

## 2020-07-08 NOTE — Telephone Encounter (Signed)
I spoke with Jade Hernandez and she said she had labs recently at Dr Raul Del office. I will try and get those before she comes for these in case Dr Carlean Purl changes the order. I called and the phones are off currently.

## 2020-07-09 DIAGNOSIS — Z23 Encounter for immunization: Secondary | ICD-10-CM | POA: Diagnosis not present

## 2020-07-09 NOTE — Telephone Encounter (Signed)
error 

## 2020-07-10 ENCOUNTER — Telehealth: Payer: Self-pay | Admitting: Internal Medicine

## 2020-07-10 ENCOUNTER — Other Ambulatory Visit: Payer: Self-pay

## 2020-07-10 DIAGNOSIS — R197 Diarrhea, unspecified: Secondary | ICD-10-CM | POA: Diagnosis not present

## 2020-07-10 DIAGNOSIS — K529 Noninfective gastroenteritis and colitis, unspecified: Secondary | ICD-10-CM

## 2020-07-10 DIAGNOSIS — R634 Abnormal weight loss: Secondary | ICD-10-CM

## 2020-07-10 DIAGNOSIS — D509 Iron deficiency anemia, unspecified: Secondary | ICD-10-CM

## 2020-07-10 LAB — VITAMIN B12: Vitamin B-12: 284 pg/mL (ref 211–911)

## 2020-07-10 LAB — FERRITIN: Ferritin: 109.6 ng/mL (ref 10.0–291.0)

## 2020-07-10 NOTE — Telephone Encounter (Signed)
Patient informed of CT appointment: [facility] [place] [employer]. , informed to pick up the contrast and be there on 07/22/2020 at 1:30pm for a 1:50pm appointment. Drink at 11:30 and 12:30 and NPO 4 hours. She read it back to me and wrote it all down. I gave her there # to call to see if anyone cancels so she can be done sooner.

## 2020-07-13 ENCOUNTER — Other Ambulatory Visit: Payer: Self-pay | Admitting: Cardiovascular Disease

## 2020-07-14 ENCOUNTER — Telehealth: Payer: Self-pay | Admitting: Internal Medicine

## 2020-07-14 NOTE — Telephone Encounter (Signed)
Pt's daughter is requesting a call back from a nurse regarding stool sample results.

## 2020-07-14 NOTE — Telephone Encounter (Signed)
I explained that stool studies are still pending.  She would like to speak with PJ.  She asks that PJ call her when she can

## 2020-07-14 NOTE — Telephone Encounter (Signed)
I spoke with the lab and found out the stool test have a 4-6 day turn around time so we should know something soon. I called and told Jade Hernandez and I also called her daughter Jade Hernandez at [phone].

## 2020-07-15 LAB — PANCREATIC ELASTASE, FECAL: Pancreatic Elastase, Fecal: >500 ug Elast./g (ref >200)

## 2020-07-15 LAB — CALPROTECTIN, FECAL: Calprotectin, Fecal: 3116 ug/g — ABNORMAL HIGH (ref 0–120)

## 2020-07-16 ENCOUNTER — Other Ambulatory Visit: Payer: Self-pay | Admitting: Internal Medicine

## 2020-07-16 MED ORDER — BUDESONIDE 3 MG PO CPEP
9.0000 mg | ORAL_CAPSULE | Freq: Every day | ORAL | 1 refills | Status: DC
Start: 2020-07-16 — End: 2020-08-27

## 2020-07-22 ENCOUNTER — Ambulatory Visit
Admission: RE | Admit: 2020-07-22 | Discharge: 2020-07-22 | Disposition: A | Discharge status: Home / Self Care | Payer: Medicare Other | Source: Ambulatory Visit | Attending: Internal Medicine | Admitting: Internal Medicine

## 2020-07-22 DIAGNOSIS — K529 Noninfective gastroenteritis and colitis, unspecified: Secondary | ICD-10-CM | POA: Diagnosis not present

## 2020-07-22 DIAGNOSIS — R634 Abnormal weight loss: Secondary | ICD-10-CM | POA: Diagnosis not present

## 2020-07-22 DIAGNOSIS — R197 Diarrhea, unspecified: Secondary | ICD-10-CM

## 2020-07-22 DIAGNOSIS — I700 Atherosclerosis of aorta: Secondary | ICD-10-CM | POA: Diagnosis not present

## 2020-07-22 MED ORDER — IOPAMIDOL (ISOVUE-300) INJECTION 61%
100.0000 mL | Freq: Once | INTRAVENOUS | Status: AC | PRN
  Administered 2020-07-22: 100 mL via INTRAVENOUS

## 2020-07-23 ENCOUNTER — Other Ambulatory Visit: Payer: Self-pay

## 2020-07-23 ENCOUNTER — Encounter: Payer: Self-pay | Admitting: Nurse Practitioner

## 2020-07-23 ENCOUNTER — Other Ambulatory Visit: Payer: Medicare Other

## 2020-07-23 ENCOUNTER — Ambulatory Visit (INDEPENDENT_AMBULATORY_CARE_PROVIDER_SITE_OTHER): Payer: Medicare Other | Admitting: Nurse Practitioner

## 2020-07-23 VITALS — BP 124/70 | HR 92 | Ht 62.0 in | Wt 163.0 lb

## 2020-07-23 DIAGNOSIS — K529 Noninfective gastroenteritis and colitis, unspecified: Secondary | ICD-10-CM

## 2020-07-23 DIAGNOSIS — R197 Diarrhea, unspecified: Secondary | ICD-10-CM

## 2020-07-23 DIAGNOSIS — R634 Abnormal weight loss: Secondary | ICD-10-CM

## 2020-07-23 DIAGNOSIS — R21 Rash and other nonspecific skin eruption: Secondary | ICD-10-CM | POA: Diagnosis not present

## 2020-07-23 MED ORDER — MESALAMINE 1.2 G PO TBEC: DELAYED_RELEASE_TABLET | ORAL | 1 refills | Status: DC

## 2020-07-23 NOTE — Progress Notes (Signed)
Opened in error

## 2020-07-23 NOTE — Patient Instructions (Addendum)
If you are age 77 or older, your body mass index should be between 23-30. Your Body mass index is 29.81 kg/m. If this is out of the aforementioned range listed, please consider follow up with your Primary Care Provider.  If you are age 66 or younger, your body mass index should be between 19-25. Your Body mass index is 29.81 kg/m. If this is out of the aformentioned range listed, please consider follow up with your Primary Care Provider.   We have sent the following medications to your pharmacy for you to pick up at your convenience: Lialda 1.2 gm 2 tablets twice a day   Do not take aleve or meloxicam  Call the office if your symptoms worsen  Due to recent changes in healthcare laws, you may see the results of your imaging and laboratory studies on MyChart before your provider has had a chance to review them.  We understand that in some cases there may be results that are confusing or concerning to you. Not all laboratory results come back in the same time frame and the provider may be waiting for multiple results in order to interpret others.  Please give [place] 48 hours in order for your provider to thoroughly review all the results before contacting the office for clarification of your results.   Thank you for choosing [facility] Noralyn Pick, CRNP

## 2020-07-23 NOTE — Progress Notes (Addendum)
07/23/2020 Jade Hernandez [id number] Jun 25, 1941   Chief Complaint: Colitis follow up   History of Present Illness: Jade Hernandez is a 79 year old female with a history of arthritis, hypertension, hypercholesterolemia, diverticulosis and colon polyps.  She is followed by Dr. Carlean Purl.  She presents today accompanied by her daughter for further evaluation regarding a colitis flare.  She underwent a colonoscopy 01/25/2019 which identified ileitis and inflammation with ulcerated mucosa in the entire colon.  Biopsies of the TI identified mildly active nonspecific ileitis.  Biopsies of the colon identified acute inflammation without evidence of chronicity possibly due to infection vs NSAID use vs early IBD.  She previously was taking meloxicam which she discontinued after the colonoscopy was completed.  She remains on ASA 81 mg daily.  She was assessed by Dr. 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 She was treated with Budesonide 9 mg daily x  2 months and her diarrhea resolved.  No family history of IBD.  She developed diarrhea around 05/30/2019.  She contacted Dr. Carlean Purl as she was concerned she was having a colitis flare  She was restarted on Budesonide 3 mg tab three po QD x 4 weeks. She also reported being under extreme stress as her husband was diagnosed with pancreatic cancer and she is his primary caregiver.  She took Imodium and dicyclomine without improvement.  Her diarrhea and lower abdominal cramping did not improve.   She continues to have past 2-3 loose nonbloody diarrhea stools daily.  No rectal bleeding or melena.  No mucus per the rectum.  She has mild nausea at times without vomiting.  An abdominal/pelvic CT with contrast 07/22/2020 which showed mild to moderate enterocolitis with diffuse involvement of the distal ileum and colon concerning for infectious versus Crohn's colitis.  No evidence of an abscess or bowel obstruction.  A fecal calprotectin level was grossly elevated at  3,116 on 8/26. Fecal pancreatic elastase level > 500.  No recent antibiotics.  She has frequent sweats.  No fever or chills.  She reports losing 14 pounds over the past 3 months. Budesonide 9mg  QD was restarted on 8/25. She remains on Budesonide but she reduced the dose from 9mg  to 6mg  QD.  She stated her sweats worsened when she took Budesonide 3 mg three tablets at the same time. She questions if there is an alternative medication she can take.  No other complaints.  CBC Latest Ref Rng & Units 07/08/2020 02/24/2017 02/23/2017  WBC 4.0 - 10.5 K/uL 6.7 7.5 9.1  Hemoglobin 12.0 - 15.0 g/dL 11.3(L) 10.5(L) 11.7(L)  Hematocrit 36 - 46 % 34.8(L) 32.0(L) 35.7(L)  Platelets 150 - 400 K/uL 372.0 165 180  MCV 76.  Ferritin 109.6.  B12 284.  TSH 2.6.   CMP Latest Ref Rng & Units 07/08/2020 02/23/2017 02/22/2017  Glucose 70 - 99 mg/dL 129(H) 107(H) 145(H)  BUN 6 - 23 mg/dL 9 8 11   Creatinine 0.40 - 1.20 mg/dL 0.71 0.53 0.47  Sodium 135 - 145 mEq/L 136 139 138  Potassium 3.5 - 5.1 mEq/L 3.7 3.3(L) 3.6  Chloride 96 - 112 mEq/L 100 102 109  CO2 19 - 32 mEq/L 29 29 24   Calcium 8.4 - 10.5 mg/dL 8.6 8.9 8.3(L)  Total Protein 6.0 - 8.3 g/dL 6.5 - -  Total Bilirubin 0.2 - 1.2 mg/dL 0.7 - -  Alkaline Phos 39 - 117 U/L 60 - -  AST 0 - 37 U/L 11 - -  ALT 0 -  35 U/L 8 - -      Abdominal/pelvic CT with contrast 07/22/2020: Mild to moderate enterocolitis with diffuse involvement of the distal ileum and colon. Differential diagnosis includes infectious etiologies and Crohn disease. No evidence of abscess, bowel obstruction, or other complication. Aortic Atherosclerosis    Colonoscopy 01/25/2019: - Ileitis. Biopsied. - Congested, erythematous, inflamed and ulcerated mucosa in the entire examined colon. Biopsied. 1. Surgical [P], terminal ileum inflammation - MILDLY ACTIVE NON-SPECIFIC ILEITIS - NO HIGH GRADE DYSPLASIA OR MALIGNANCY IDENTIFIED - SEE COMMENT 2. Surgical [P], random sites-colon - ACUTE  INFLAMMATION WITHOUT EVIDENCE OF CHRONICITY - SEE COMMENT Microscopic Comment 1. and 2. The colon biopsies have acute inflammation without signs of chronicity. There is focal ulceration. These findings are non-specific; the differential includes infection, drug, NSAIDs and early inflammatory bowel disease.   Current Medications, Allergies, Past Medical History, Past Surgical History, Family History and Social History were reviewed in [employer] record.   Physical Exam: BP 124/70   Pulse 92   Ht 5\' 2"  (1.575 m)   Wt 163 lb (73.9 kg)   BMI 29.81 kg/m   Wt Readings from Last 3 Encounters:  07/23/20 163 lb (73.9 kg)  01/11/20 186 lb (84.4 kg)  01/08/19 173 lb (78.5 kg)   General: Well developed 79 year old female in no acute distress. Head: Normocephalic and atraumatic. Eyes: No scleral icterus. Conjunctiva pink . Ears: Normal auditory acuity. Mouth: Dentition intact. No ulcers or lesions.  Lungs: Clear throughout to auscultation. Heart: Regular rate and rhythm, no murmur. Abdomen: Soft, nontender and nondistended. No masses or hepatomegaly. Normal bowel sounds x 4 quadrants.  Rectal: Deferred  Musculoskeletal: Symmetrical with no gross deformities. Extremities: No edema. Neurological: Alert oriented x 4. No focal deficits.  Psychological: Alert and cooperative. Normal mood and affect  Assessment and Recommendations:  39.  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 CTAP 07/22/2020 showed enterocolitis.  -C. difficile PCR -Budesonide 3mg  one po tid (as patient did not tolerate taking 3mg  three tabs in one dose) -Start Lialda 1.2gm two po bid -Diet as tolerated -Patient to call office if symptoms worsen -Consider small bowel CT enterography or small bowel MRI, defer to Dr. Carlean Purl -Follow up in office with Dr. Carlean Purl in 8 weeks  -No NSAIDS  2. Microcytic anemia. Hg  11.3. MCV 76. Normal Ferritin. Low normal B12. -Iron, iron saturation, TIBC, tTG and IgA, CRP -Most likely would benefit from B12 injections   Agree with plans per Carl Best, NP called and reviewed with patient as well

## 2020-07-24 ENCOUNTER — Other Ambulatory Visit: Payer: Self-pay

## 2020-07-24 ENCOUNTER — Other Ambulatory Visit (INDEPENDENT_AMBULATORY_CARE_PROVIDER_SITE_OTHER): Payer: Medicare Other

## 2020-07-24 ENCOUNTER — Encounter: Payer: Self-pay | Admitting: Nurse Practitioner

## 2020-07-24 ENCOUNTER — Telehealth: Payer: Self-pay

## 2020-07-24 DIAGNOSIS — D519 Vitamin B12 deficiency anemia, unspecified: Secondary | ICD-10-CM

## 2020-07-24 DIAGNOSIS — D509 Iron deficiency anemia, unspecified: Secondary | ICD-10-CM | POA: Diagnosis not present

## 2020-07-24 DIAGNOSIS — K5080 Crohn's disease of both small and large intestine without complications: Secondary | ICD-10-CM

## 2020-07-24 LAB — CLOSTRIDIUM DIFFICILE TOXIN B, QUALITATIVE, REAL-TIME PCR: Toxigenic C. Difficile by PCR: NOT DETECTED

## 2020-07-24 LAB — IBC PANEL
Iron: 13 ug/dL — ABNORMAL LOW (ref 42–145)
Saturation Ratios: 4.5 % — ABNORMAL LOW (ref 20.0–50.0)
Transferrin: 206 mg/dL — ABNORMAL LOW (ref 212.0–360.0)

## 2020-07-24 LAB — C-REACTIVE PROTEIN: CRP: 14.1 mg/dL (ref 0.5–20.0)

## 2020-07-24 NOTE — Telephone Encounter (Signed)
error 

## 2020-07-24 NOTE — Telephone Encounter (Signed)
Per Carl Best, the patient has been asked to come for labs. Orders for iron, iron saturation, TIBC, ferritin and CRP sent to the lab. Advised the patient to come to the basement of [facility] at her convenience, she will not need to be fasting.  The lab is open Monday - Friday 7:30 am- 5 pm.

## 2020-07-25 LAB — IRON,TIBC AND FERRITIN PANEL
Ferritin: 279 ng/mL — ABNORMAL HIGH (ref 15–150)
Iron Saturation: 5 % — CL (ref 15–55)
Iron: 13 ug/dL — ABNORMAL LOW (ref 27–139)
Total Iron Binding Capacity: 239 ug/dL — ABNORMAL LOW (ref 250–450)
UIBC: 226 ug/dL (ref 118–369)

## 2020-07-28 ENCOUNTER — Ambulatory Visit: Payer: Medicare Other | Admitting: Internal Medicine

## 2020-07-28 ENCOUNTER — Telehealth: Payer: Self-pay | Admitting: Nurse Practitioner

## 2020-07-28 NOTE — Addendum Note (Signed)
Addended by: Virgina Evener A on: 07/28/2020 01:38 PM   Modules accepted: Orders

## 2020-07-30 DIAGNOSIS — I8310 Varicose veins of unspecified lower extremity with inflammation: Secondary | ICD-10-CM | POA: Diagnosis not present

## 2020-08-04 DIAGNOSIS — N3281 Overactive bladder: Secondary | ICD-10-CM | POA: Diagnosis not present

## 2020-08-04 DIAGNOSIS — N3020 Other chronic cystitis without hematuria: Secondary | ICD-10-CM | POA: Diagnosis not present

## 2020-08-11 ENCOUNTER — Other Ambulatory Visit: Payer: Self-pay | Admitting: Family Medicine

## 2020-08-11 ENCOUNTER — Ambulatory Visit
Admission: RE | Admit: 2020-08-11 | Discharge: 2020-08-11 | Disposition: A | Discharge status: Home / Self Care | Payer: Medicare Other | Source: Ambulatory Visit | Attending: Family Medicine | Admitting: Family Medicine

## 2020-08-11 DIAGNOSIS — K50019 Crohn's disease of small intestine with unspecified complications: Secondary | ICD-10-CM | POA: Diagnosis not present

## 2020-08-11 DIAGNOSIS — R600 Localized edema: Secondary | ICD-10-CM | POA: Diagnosis not present

## 2020-08-11 DIAGNOSIS — J9811 Atelectasis: Secondary | ICD-10-CM | POA: Diagnosis not present

## 2020-08-11 DIAGNOSIS — R634 Abnormal weight loss: Secondary | ICD-10-CM | POA: Diagnosis not present

## 2020-08-11 DIAGNOSIS — E8809 Other disorders of plasma-protein metabolism, not elsewhere classified: Secondary | ICD-10-CM | POA: Diagnosis not present

## 2020-08-11 DIAGNOSIS — I517 Cardiomegaly: Secondary | ICD-10-CM | POA: Diagnosis not present

## 2020-08-11 DIAGNOSIS — D649 Anemia, unspecified: Secondary | ICD-10-CM | POA: Diagnosis not present

## 2020-08-15 DIAGNOSIS — I517 Cardiomegaly: Secondary | ICD-10-CM | POA: Diagnosis not present

## 2020-08-15 DIAGNOSIS — R600 Localized edema: Secondary | ICD-10-CM | POA: Diagnosis not present

## 2020-08-18 ENCOUNTER — Other Ambulatory Visit (HOSPITAL_COMMUNITY): Payer: Self-pay | Admitting: *Deleted

## 2020-08-19 ENCOUNTER — Encounter (HOSPITAL_COMMUNITY)
Admission: RE | Admit: 2020-08-19 | Discharge: 2020-08-19 | Disposition: A | Discharge status: Home / Self Care | Payer: Medicare Other | Source: Ambulatory Visit | Attending: Family Medicine | Admitting: Family Medicine

## 2020-08-19 ENCOUNTER — Other Ambulatory Visit: Payer: Self-pay

## 2020-08-19 DIAGNOSIS — D509 Iron deficiency anemia, unspecified: Secondary | ICD-10-CM | POA: Diagnosis not present

## 2020-08-19 MED ORDER — SODIUM CHLORIDE 0.9 % IV SOLN
510.0000 mg | INTRAVENOUS | Status: DC
  Administered 2020-08-19: 510 mg via INTRAVENOUS
  Filled 2020-08-19: qty 17

## 2020-08-19 NOTE — Progress Notes (Signed)
Pt states she is feeling much better.

## 2020-08-19 NOTE — Discharge Instructions (Signed)

## 2020-08-19 NOTE — Progress Notes (Signed)
Pt complained of palpatations 3 min into feraheme infusion.  Infusion stopped immediately.  Pt placed on 2 L O2 [place].  Pt c/o hurting all over especially back.  NS hung after feraheme removed from [place].  VS are as follows.. HR 97 O2 sats 99 BP 152/83.  Dr Brigitte Pulse notified.

## 2020-08-19 NOTE — Progress Notes (Signed)
[name],RN at Dr Raul Del office notified. She will discuss with Dr Clarnce Flock tomorrow.  Pt states she feels back to normal and like she did when she came in. VS are as follows 80-100%-124/81 Pt to home

## 2020-08-26 ENCOUNTER — Encounter (HOSPITAL_COMMUNITY): Payer: Medicare Other

## 2020-08-27 ENCOUNTER — Ambulatory Visit (INDEPENDENT_AMBULATORY_CARE_PROVIDER_SITE_OTHER): Payer: Medicare Other | Admitting: Nurse Practitioner

## 2020-08-27 ENCOUNTER — Encounter: Payer: Self-pay | Admitting: Nurse Practitioner

## 2020-08-27 DIAGNOSIS — K5000 Crohn's disease of small intestine without complications: Secondary | ICD-10-CM

## 2020-08-27 MED ORDER — BUDESONIDE 3 MG PO CPEP: 3.0000 mg | ORAL_CAPSULE | Freq: Three times a day (TID) | ORAL | 0 refills | Status: DC

## 2020-08-27 NOTE — Patient Instructions (Addendum)
If you are age 77 or older, your body mass index should be between 23-30. Your Body mass index is 28.61 kg/m. If this is out of the aforementioned range listed, please consider follow up with your Primary Care Provider.  If you are age 36 or younger, your body mass index should be between 19-25. Your Body mass index is 28.61 kg/m. If this is out of the aformentioned range listed, please consider follow up with your Primary Care Provider.   START Budesonide 3 mg 1 tablet daily.  Follow up with your primary care for repeat labs.  You have been scheduled to follow up with Dr. Carlean Purl on October 23, 2020 at 1:30 pm.

## 2020-08-27 NOTE — Progress Notes (Signed)
08/27/2020 [name] [id number] [birth year]   Chief Complaint: Ileitis follow up  History of Present Illness: Jade Hernandez is a 79 year old female with a history of arthritis, hypertension, hypercholesterolemia, diverticulosis and colon polyps.  She is followed by Dr. Carlean Purl. I saw her in the office on 07/23/2020 due to having a colitis flare and weight loss.  CTAP 07/22/2020 showed enterocolitis. She was started on Budesonide 71m 1 po tid and Lialda 1.2gm two tabs po bid.   She underwent laboratory studies 08/11/2020 which showed worsening anemia.  Hemoglobin 10.7.  Hematocrit 33.3.  MCV 72.5.  She was prescribed Feraheme 500 mg x 1 dose as ordered by Dr. SBrigitte Pulse [facility]. She developed palpitations and significant upper back pain within 1 or 2 minutes after Feraheme infusion was started. The iron infusion was discontinued and she was monitored closely. Her palpitations and back pain abated in less than 30 minutes and she was discharged home. She presents to our office today for further follow up. Her daughter is present. She stated she is tolerating iron 670mone po daily for the past 3 to 4 weeks without difficulty. She became nauseated after taking the large Mesalamine tablets so she stopped taking it 1 1/2 weeks ago. The Budesonide 40m41mne tab tid resulted in leg swelling so she stopped taking it  1 1/2 weeks ago as well. She denies having any abdominal pain. She routinely awakens between 4 to 5am and passes 2 to 3 soft BMs over the next 1 to 2 hours. No rectal bleeding. Her stools are dark, almost black since starting the po iron. No mucous per the rectum. She is taking Imodium 1 tab most days. Her husband is undergoing treatment for pancreatic cancer. Her weight was 186 lbs Feb 2021 down to 163 -> 161lbs. No fever, sweats or chills. No other complaints today.   Laboratory studies 08/11/2020: WBC 5.9.  Hemoglobin 10.7.  Hematocrit 33.3.   MCV 72.5.  Platelet 422.  Iron 21.  Iron saturation 8.  Transferrin 183.  Vitamin B12 1158.  TSH 2.89.  Glucose 96.  BUN 14.  Creatinine 0.40.  Sodium 139.  Potassium 3.4.  Chloride 102.  CO2 29.  Calcium 8.3.  Total protein 5.8.  Albumin 2.4.  Total bili 0.6.  Alk phos 54.  AST 15.  ALT 14.   Colonoscopy 01/25/2019 which identified ileitis and inflammation with ulcerated mucosa in the entire colon.  Biopsies of the TI identified mildly active nonspecific ileitis.  Biopsies of the colon identified acute inflammation without evidence of chronicity possibly due to infection vs NSAID use vs early IBD.     Current Outpatient Medications on File Prior to Visit  Medication Sig Dispense Refill  . B Complex Vitamins (B-COMPLEX/B-12 PO) Take by mouth.    . ferrous sulfate 324 MG TBEC Take 324 mg by mouth.    . loperamide (IMODIUM) 2 MG capsule Take 2 mg by mouth as needed for diarrhea or loose stools.    . metoprolol succinate (TOPROL-XL) 50 MG 24 hr tablet TAKE 1 AND 1/2 TABLETS(75 MG) BY MOUTH DAILY 135 tablet 2   No current facility-administered medications on file prior to visit.    Allergies  Allergen Reactions  . Feraheme [Ferumoxytol] Shortness Of Breath and Palpitations    Severe pain all over     Current Medications, Allergies, Past Medical History, Past Surgical History, Family History and Social History were  reviewed in [facility] record.   Review of Systems:   Constitutional: Negative for fever, sweats, chills or weight loss.  Respiratory: Negative for shortness of breath.   Cardiovascular: Negative for chest pain, palpitations and leg swelling.  Gastrointestinal: See HPI.  Musculoskeletal: Negative for back pain or muscle aches.  Neurological: Negative for dizziness, headaches or paresthesias.    Physical Exam: BP 106/70   Pulse 90   Ht '5\' 3"'  (1.6 m)   Wt 161 lb 8 oz (73.3 kg)   BMI 28.61 kg/m   Wt Readings from Last 3 Encounters:  08/27/20 161  lb 8 oz (73.3 kg)  07/23/20 163 lb (73.9 kg)  01/11/20 186 lb (84.4 kg)   General: Well developed 79 year old female in no acute distress. Head: Normocephalic and atraumatic. Eyes: No scleral icterus. Conjunctiva pink . Lungs: Clear throughout to auscultation. Heart: Regular rate and rhythm, no murmur. Abdomen: Soft, nontender and nondistended. No masses or hepatomegaly. Normal bowel sounds x 4 quadrants.  Rectal: Deferred  Musculoskeletal: Symmetrical with no gross deformities. Extremities: No edema. Neurological: Alert oriented x 4. No focal deficits.  Psychological: Alert and cooperative. Normal mood and affect  Assessment and Recommendations:  57.  79 year old female with Crohn's ileitis with iron deficiency anemia.  -Continue iron 71m po one tab daily -Follow up with PCP for repeat CBC and iron panel as planned  -Mesalamine discontinued  -Try Budesonide 355mone tab po daily, patient to call our office if not tolerated/if leg swelling recurs -Follow up with Dr. GeCarlean Purln office in 2 months -Dr. GeCarlean Purliscussed the above plan with the patient and her daughter   2. Weight loss most likely due to # 1. CTAP 07/22/2020 showed enterocolitis without other abnormalities.  -Continue to monitor  -Goal is to avoid further weight loss

## 2020-09-01 DIAGNOSIS — D509 Iron deficiency anemia, unspecified: Secondary | ICD-10-CM | POA: Diagnosis not present

## 2020-09-01 DIAGNOSIS — R600 Localized edema: Secondary | ICD-10-CM | POA: Diagnosis not present

## 2020-09-01 DIAGNOSIS — K50019 Crohn's disease of small intestine with unspecified complications: Secondary | ICD-10-CM | POA: Diagnosis not present

## 2020-09-01 DIAGNOSIS — I10 Essential (primary) hypertension: Secondary | ICD-10-CM | POA: Diagnosis not present

## 2020-09-01 DIAGNOSIS — R634 Abnormal weight loss: Secondary | ICD-10-CM | POA: Diagnosis not present

## 2020-09-15 DIAGNOSIS — H6123 Impacted cerumen, bilateral: Secondary | ICD-10-CM | POA: Diagnosis not present

## 2020-09-28 ENCOUNTER — Telehealth: Payer: Self-pay | Admitting: Gastroenterology

## 2020-09-28 NOTE — Telephone Encounter (Signed)
Patient of yours with Crohn's ileitis last seen by [facility] a month ago.  Has had 4-5 days crampy abd pain and diarrhea with urgency, no blood.  Daughter was with her on phone and confirms history.  Sounds like budesonide recently resumed at 3 mg daily (had been on 9 md daily at some point) along with mesalamine.    No recently Abx or sick contacts.  I recommended using imodium sine dicyclomine had not been helping much, and also increase budesonide to 9 mg with 11/15 Am dose.  Please review chart and contact patient with further advice.  - HD

## 2020-09-29 NOTE — Telephone Encounter (Signed)
Please see if she can come Wed 0830 - slot is open  Or at [facility]

## 2020-09-29 NOTE — Telephone Encounter (Signed)
She can come Wednesday at 11:30AM.

## 2020-10-01 ENCOUNTER — Ambulatory Visit (INDEPENDENT_AMBULATORY_CARE_PROVIDER_SITE_OTHER): Payer: Medicare Other | Admitting: Internal Medicine

## 2020-10-01 ENCOUNTER — Other Ambulatory Visit (INDEPENDENT_AMBULATORY_CARE_PROVIDER_SITE_OTHER): Payer: Medicare Other

## 2020-10-01 ENCOUNTER — Encounter: Payer: Self-pay | Admitting: Internal Medicine

## 2020-10-01 VITALS — BP 128/70 | HR 95 | Ht 63.0 in | Wt 156.0 lb

## 2020-10-01 DIAGNOSIS — D500 Iron deficiency anemia secondary to blood loss (chronic): Secondary | ICD-10-CM | POA: Diagnosis not present

## 2020-10-01 DIAGNOSIS — D519 Vitamin B12 deficiency anemia, unspecified: Secondary | ICD-10-CM

## 2020-10-01 DIAGNOSIS — R634 Abnormal weight loss: Secondary | ICD-10-CM

## 2020-10-01 DIAGNOSIS — K50818 Crohn's disease of both small and large intestine with other complication: Secondary | ICD-10-CM | POA: Diagnosis not present

## 2020-10-01 DIAGNOSIS — R630 Anorexia: Secondary | ICD-10-CM

## 2020-10-01 DIAGNOSIS — R5383 Other fatigue: Secondary | ICD-10-CM | POA: Diagnosis not present

## 2020-10-01 DIAGNOSIS — D509 Iron deficiency anemia, unspecified: Secondary | ICD-10-CM

## 2020-10-01 LAB — VITAMIN B12: Vitamin B-12: 599 pg/mL (ref 211–911)

## 2020-10-01 LAB — CBC WITH DIFFERENTIAL/PLATELET
Basophils Absolute: 0.1 10*3/uL (ref 0.0–0.1)
Basophils Relative: 0.8 % (ref 0.0–3.0)
Eosinophils Absolute: 0 10*3/uL (ref 0.0–0.7)
Eosinophils Relative: 0.6 % (ref 0.0–5.0)
HCT: 34 % — ABNORMAL LOW (ref 36.0–46.0)
Hemoglobin: 10.6 g/dL — ABNORMAL LOW (ref 12.0–15.0)
Lymphocytes Relative: 17.4 % (ref 12.0–46.0)
Lymphs Abs: 1.2 10*3/uL (ref 0.7–4.0)
MCHC: 31.2 g/dL (ref 30.0–36.0)
MCV: 71.5 fl — ABNORMAL LOW (ref 78.0–100.0)
Monocytes Absolute: 1 10*3/uL (ref 0.1–1.0)
Monocytes Relative: 13.9 % — ABNORMAL HIGH (ref 3.0–12.0)
Neutro Abs: 4.7 10*3/uL (ref 1.4–7.7)
Neutrophils Relative %: 67.3 % (ref 43.0–77.0)
Platelets: 444 10*3/uL — ABNORMAL HIGH (ref 150.0–400.0)
RBC: 4.75 Mil/uL (ref 3.87–5.11)
RDW: 18.8 % — ABNORMAL HIGH (ref 11.5–15.5)
WBC: 7 10*3/uL (ref 4.0–10.5)

## 2020-10-01 LAB — IBC PANEL
Iron: 15 ug/dL — ABNORMAL LOW (ref 42–145)
Saturation Ratios: 5.4 % — ABNORMAL LOW (ref 20.0–50.0)
Transferrin: 197 mg/dL — ABNORMAL LOW (ref 212.0–360.0)

## 2020-10-01 NOTE — Progress Notes (Signed)
Jade Hernandez 79 y.o. 26-Feb-1941 [id number]  Assessment & Plan:   Encounter Diagnoses  Name Primary?  . 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 Anemia due to vitamin B12 deficiency, unspecified B12 deficiency type   . Iron deficiency anemia due to chronic blood loss   . Loss of weight   . Anorexia   . Lethargy    It is looking like she has Crohn's disease of the small and large intestine.  However prior biopsies never did show chronicity but she certainly had chronic symptoms that have responded to steroids.  When she came off mesalamine (will not really help small bowel disease) and went down to 3 mg of the budesonide she had a flare of diarrhea and cramps.  She is continuing to lose weight.  There is an important background of her husband having pancreatic cancer and being under treatment which could have some impact in some of these issues.   She will continue budesonide 9 mg daily. She will watch lactose consumption right now as she seems to be flaring Reduced fiber in diet Crohn's disease handout provided to the patient and her daughter today Imodium as needed for the diarrhea may take more than the 2 a day she takes at times now Use the dicyclomine as well. Continue protein drinks supplements.  (I do not think these are contributing to her diarrhea though keep that in mind)  There is an existing appointment for 12 9 and we will keep that and regroup.   She had follow-up labs not yet done and her iron is less than 10 TIBC 252 saturation 2% with a ferritin of 278.  Transferrin 197.  This is looking like chronic disease to me though she could have some blood loss, there could be a component of iron deficiency given that ferritin can be an acute phase reactant.  I think it is reasonable to continue her iron.  Her B12 is up nicely at 599.  Her hemoglobin 10.6 MCV down to 71 platelets 444.  She may end up needing more aggressive  therapy like Biologics but I do not think the timing is right for that currently.  Anticipate at least rechecking fecal calprotectin when she returns depending upon clinical course.  She could need repeat colonoscopy though I do not know that that is going to change what we do right now.  Subjective:   Chief Complaint: Diarrhea and abdominal pain  HPI The patient is a 79 year old [place] woman here with her daughter because of recent onset of relatively severe abdominal cramps and diarrhea, she called over the weekend and a work in appointment was arranged.  She saw  Best on September 8 after having recurrent diarrhea and abdominal pelvic CT with contrast showing mild to moderate enterocolitis with diffuse involvement of the distal ileum and some areas of the colon concerning for infectious versus Crohn's colitis.  In August she had a very elevated calprotectin at 311 6 and a normal pancreatic elastase level.  She had been restarted on budesonide but she had reduced the dose to 6 mg because of sweats.  She was started on mesalamine at that visit with Jaclyn Shaggy she was told to space out the budesonide 3 times daily because of the sweats.  She followed up on October 13 and I actually saw her during that visit as well. a  She had been given a Feraheme infusion under the orders of her primary  care provider in October but had palpitations and back pain so that was discontinued.  She has been taking p.o. iron, she had nausea with mesalamine so she had actually self stopped it.  She was improved somewhat so we decided to try her on budesonide 3 mg daily to see how she did.  However she began to have cramps and diarrhea again recently as above and was directed by my on-call partner to go back up to 9 mg a day of the budesonide.  Stools are loose not watery increase in frequency.  She is taking about 2 Imodium a day with some benefit.  She is taking protein supplement drinks.  Daughter is  asking about an appetite stimulant.  She awakens early in the morning as usual per her habit and the diarrhea or loose stool start then.  Budesonide is probably still creating some sweats.  She is quite fatigued.  Wt Readings from Last 3 Encounters:  10/01/20 156 lb (70.8 kg)  08/27/20 161 lb 8 oz (73.3 kg)  07/23/20 163 lb (73.9 kg)     Wt Readings from Last 3 Encounters:  10/01/20 156 lb (70.8 kg)  08/27/20 161 lb 8 oz (73.3 kg)  07/23/20 163 lb (73.9 kg)    Allergies  Allergen Reactions  . Feraheme [Ferumoxytol] Shortness Of Breath and Palpitations    Severe pain all over   Current Meds  Medication Sig  . B Complex Vitamins (B-COMPLEX/B-12 PO) Take by mouth.  . budesonide (ENTOCORT EC) 3 MG 24 hr capsule Take 1 capsule (3 mg total) by mouth in the morning, at noon, and at bedtime.  . ferrous sulfate 324 MG TBEC Take 324 mg by mouth.  . loperamide (IMODIUM) 2 MG capsule Take 2 mg by mouth as needed for diarrhea or loose stools.  . metoprolol succinate (TOPROL-XL) 50 MG 24 hr tablet TAKE 1 AND 1/2 TABLETS(75 MG) BY MOUTH DAILY  . Probiotic Product (PROBIOTIC DAILY PO) Take 1 capsule by mouth daily.   Past Medical History:  Diagnosis Date  . Adenomatous colon polyp   . Arthritis   . Diverticulosis   . High cholesterol   . Hypertension   . IBS (irritable bowel syndrome)   . Noninfectious ileitis and colitis 07/17/2019  . Palpitations   . UTI (urinary tract infection)    Past Surgical History:  Procedure Laterality Date  . BILATERAL CARPAL TUNNEL RELEASE    . COLONOSCOPY    . NM MYOCAR PERF WALL MOTION  10/02/2007   No significant ischemia demonstrated  . 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 US ECHOCARDIOGRAPHY  10/29/2010   Proximal septal thickening noted, EF =>55%,LA mildly dilated,mild mitral annular  ca+, AOV mildly sclerotic, mild AI.   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   Review of Systems As per HPI  Objective:   Physical Exam  BP 128/70   Pulse 95   Ht 5\' 3"  (1.6 m)   Wt 156 lb (70.8 kg)   BMI 27.63 kg/m  Elderly  Caucasian woman no acute distress Abdominal exam benign

## 2020-10-01 NOTE — Patient Instructions (Signed)
Your provider has requested that you go to the basement level for lab work before leaving today. Press "B" on the elevator. The lab is located at the first door on the left as you exit the elevator.  We are giving you handout's to read today on crohn's, lactose free diet and also a low fiber diet (use the low fiber diet while not feeling well)  Re-start your dicyclomine that you have at home.  Continue Imodium as needed.   Keep your December 9th appointment with Dr Carlean Purl.   I appreciate the opportunity to care for you. Silvano Rusk, MD, [facility]

## 2020-10-02 LAB — IRON,TIBC AND FERRITIN PANEL
%SAT: 2 % (calc) — ABNORMAL LOW (ref 16–45)
Ferritin: 278 ng/mL (ref 16–288)
Iron: <10 ug/dL — ABNORMAL LOW (ref 45–160)
TIBC: 252 mcg/dL (calc) (ref 250–450)

## 2020-10-03 ENCOUNTER — Telehealth: Payer: Self-pay | Admitting: Internal Medicine

## 2020-10-03 NOTE — Telephone Encounter (Signed)
Patient calling for lab results from 11/17.  Please review and advise

## 2020-10-03 NOTE — Telephone Encounter (Signed)
Hemoglobin slightly worse than last time -   B12 level is normal and improved  Iron tests about the same As I look at the iron tests again think less likely that she has low iron but more likely has inflammation causing abnormal iron tests which can happen sometimes  I do want her to continue the iron and B12, though  Will see her again 12/9

## 2020-10-03 NOTE — Telephone Encounter (Signed)
Patient notified of results and recommendations.

## 2020-10-03 NOTE — Telephone Encounter (Signed)
Pt is requesting a call back from a nurse to discuss lab results.

## 2020-10-07 ENCOUNTER — Telehealth: Payer: Self-pay | Admitting: Internal Medicine

## 2020-10-07 NOTE — Telephone Encounter (Signed)
Patient had questions about dicyclomine and imodium.  All questions answered.  She will call back for additional questions or concerns.

## 2020-10-11 ENCOUNTER — Telehealth: Payer: Self-pay | Admitting: Nurse Practitioner

## 2020-10-11 ENCOUNTER — Telehealth: Payer: Self-pay | Admitting: Internal Medicine

## 2020-10-11 DIAGNOSIS — K5090 Crohn's disease, unspecified, without complications: Secondary | ICD-10-CM | POA: Diagnosis not present

## 2020-10-11 DIAGNOSIS — R634 Abnormal weight loss: Secondary | ICD-10-CM | POA: Diagnosis not present

## 2020-10-11 DIAGNOSIS — R197 Diarrhea, unspecified: Secondary | ICD-10-CM | POA: Diagnosis not present

## 2020-10-11 DIAGNOSIS — D649 Anemia, unspecified: Secondary | ICD-10-CM | POA: Diagnosis not present

## 2020-10-11 MED ORDER — PREDNISONE 10 MG PO TABS: 40.0000 mg | ORAL_TABLET | Freq: Every day | ORAL | 0 refills | Status: DC

## 2020-10-11 MED ORDER — DIPHENOXYLATE-ATROPINE 2.5-0.025 MG PO TABS
1.0000 | ORAL_TABLET | Freq: Four times a day (QID) | ORAL | 0 refills | Status: DC
Start: 2020-10-11 — End: 2020-10-28

## 2020-10-11 NOTE — Telephone Encounter (Signed)
ERROR

## 2020-10-11 NOTE — Telephone Encounter (Signed)
Daughter called answering service yesterday. She had her mother ( patient ) on the other phone. Patient doesn't feel like Budesonide is helping. She continues to have loose BM including 3-4 loose BMs during the night. She is taking imodium but only 2-3 doses of the 2mg  daily. Symptoms not any worse than when she saw Dr/ Carlean Purl earlier this month.   Asked patient and her daughter to increase dose of Imodium. She should get the 2 mg tablets, take 2 after the first loose BM ad one after each loose BM up to 16 mg daily. They will call back if no improvement at which time we can try Lomotil.

## 2020-10-11 NOTE — Telephone Encounter (Signed)
Spoke to both daughters and patient Worsening abdominal cramps and diarrhea, esp cramps Not vomiting but almost no appetite and forcing to drink  Anorexia remains a problem  Went to urgent care hypertensive not hypotensive No fever  Plan  Dc budesonide and dicyclomine  Start prednisone 40 mg qd  Diphenoxylate and atropine 1 qid   F/U ion office next week, possibly Monday  Consider testing for infectious diarrhea   BRAT diet  ORS recipes

## 2020-10-12 ENCOUNTER — Telehealth: Payer: Self-pay | Admitting: Internal Medicine

## 2020-10-12 DIAGNOSIS — K50818 Crohn's disease of both small and large intestine with other complication: Secondary | ICD-10-CM

## 2020-10-12 NOTE — Telephone Encounter (Signed)
Patient seen in person at her house due to abdominal cramping and diarrhea problems.  See phone note from yesterday also.  Husband and daughter [name] present   Started on prednisone 40 mg daily has had 2 doses.  Started on generic Lomotil 1 4 times daily (before meals and at bedtime).  Reports that she was able to sleep through the night without having cramps and abdominal pain and diarrhea problems overnight for the first time in a while.  Reports she is having problems with sweats not body drenching, these probably preceded the use of budesonide/steroids.  Appetite is very poor.  She is taking protein drinks but still losing some weight not quantified today, trying to drink enough fluids.  Afraid to eat some foods that are not on the low fiber diet list that I provided before.  Denies any swollen lymph nodes.  Blood pressure 130/70 pulse 84 regular with an occasional premature beat otherwise.  She does not look acutely ill.  Abdomen is soft and nontender   We will go ahead and get some labs tomorrow CBC CMET.  I have encouraged her to drink at least 6 glasses of fluid a day.  Continue protein drinks.  We did briefly discussed the possibility of using biologic agents today.  She has a cousin with Crohn's who is on Remicade and apparently her cousin's daughter also has Crohn's disease  Hopefully switching from 9 mg budesonide to 40 mg prednisone daily will make a difference as well as switching to scheduled Lomotil rather than as needed dicyclomine I did tell her she could use some of the 10 mg dicyclomine on top of the Lomotil if needed.  Heating pad for abdominal cramps is also advised.  She has follow-up with me on December 9

## 2020-10-13 ENCOUNTER — Other Ambulatory Visit (INDEPENDENT_AMBULATORY_CARE_PROVIDER_SITE_OTHER): Payer: Medicare Other

## 2020-10-13 DIAGNOSIS — K50818 Crohn's disease of both small and large intestine with other complication: Secondary | ICD-10-CM | POA: Diagnosis not present

## 2020-10-13 LAB — CBC WITH DIFFERENTIAL/PLATELET
Basophils Absolute: 0 10*3/uL (ref 0.0–0.1)
Basophils Relative: 0.3 % (ref 0.0–3.0)
Eosinophils Absolute: 0 10*3/uL (ref 0.0–0.7)
Eosinophils Relative: 0 % (ref 0.0–5.0)
HCT: 34.1 % — ABNORMAL LOW (ref 36.0–46.0)
Hemoglobin: 10.9 g/dL — ABNORMAL LOW (ref 12.0–15.0)
Lymphocytes Relative: 9.8 % — ABNORMAL LOW (ref 12.0–46.0)
Lymphs Abs: 0.4 10*3/uL — ABNORMAL LOW (ref 0.7–4.0)
MCHC: 32.1 g/dL (ref 30.0–36.0)
MCV: 70.1 fl — ABNORMAL LOW (ref 78.0–100.0)
Monocytes Absolute: 0.1 10*3/uL (ref 0.1–1.0)
Monocytes Relative: 3.3 % (ref 3.0–12.0)
Neutro Abs: 3.3 10*3/uL (ref 1.4–7.7)
Neutrophils Relative %: 86.6 % — ABNORMAL HIGH (ref 43.0–77.0)
Platelets: 476 10*3/uL — ABNORMAL HIGH (ref 150.0–400.0)
RBC: 4.86 Mil/uL (ref 3.87–5.11)
RDW: 18.6 % — ABNORMAL HIGH (ref 11.5–15.5)
WBC: 3.8 10*3/uL — ABNORMAL LOW (ref 4.0–10.5)

## 2020-10-13 LAB — COMPREHENSIVE METABOLIC PANEL
ALT: 19 U/L (ref 0–35)
AST: 19 U/L (ref 0–37)
Albumin: 2.7 g/dL — ABNORMAL LOW (ref 3.5–5.2)
Alkaline Phosphatase: 56 U/L (ref 39–117)
BUN: 14 mg/dL (ref 6–23)
CO2: 28 mEq/L (ref 19–32)
Calcium: 8.6 mg/dL (ref 8.4–10.5)
Chloride: 97 mEq/L (ref 96–112)
Creatinine, Ser: 0.43 mg/dL (ref 0.40–1.20)
GFR: 93.65 mL/min (ref >60.00)
Glucose, Bld: 157 mg/dL — ABNORMAL HIGH (ref 70–99)
Potassium: 3.5 mEq/L (ref 3.5–5.1)
Sodium: 134 mEq/L — ABNORMAL LOW (ref 135–145)
Total Bilirubin: 0.3 mg/dL (ref 0.2–1.2)
Total Protein: 6.6 g/dL (ref 6.0–8.3)

## 2020-10-14 NOTE — Telephone Encounter (Addendum)
Patient's daughter called stating mother is still having abdominal pain and diarrhea.  She has an appt on 10/23/20 but is requesting a sooner appt.  Please call patient.

## 2020-10-14 NOTE — Telephone Encounter (Signed)
Dr. Carlean Purl, I can get her in with APP????  Please advise next steps

## 2020-10-15 NOTE — Telephone Encounter (Signed)
I called the patient yesterday she was having increasing abdominal pain maybe not any different but the medication was not adequately treating it.  She gets relief with defecation.  However the cramps are fairly intense.  I advised her to take 2 generic Lomotil at a time instead of 1 to see what difference that made and we will keep follow-up for December 9 at this point.

## 2020-10-20 ENCOUNTER — Telehealth: Payer: Self-pay | Admitting: Internal Medicine

## 2020-10-20 NOTE — Telephone Encounter (Signed)
Patient advised that she should remain on prednisone until she sees Dr. Carlean Purl on 12/9

## 2020-10-20 NOTE — Telephone Encounter (Signed)
Pt wants to know if she should continue taking Prednisone until her appt with Dr. Carlean Purl on 12/9.

## 2020-10-20 NOTE — Telephone Encounter (Signed)
Pt is requesting a call back to her mobile number.

## 2020-10-23 ENCOUNTER — Ambulatory Visit (INDEPENDENT_AMBULATORY_CARE_PROVIDER_SITE_OTHER): Payer: Medicare Other | Admitting: Internal Medicine

## 2020-10-23 ENCOUNTER — Encounter: Payer: Self-pay | Admitting: Internal Medicine

## 2020-10-23 ENCOUNTER — Ambulatory Visit: Payer: Medicare Other | Admitting: Nurse Practitioner

## 2020-10-23 VITALS — BP 100/58 | HR 85 | Ht 62.0 in | Wt 150.0 lb

## 2020-10-23 DIAGNOSIS — R634 Abnormal weight loss: Secondary | ICD-10-CM

## 2020-10-23 DIAGNOSIS — K50818 Crohn's disease of both small and large intestine with other complication: Secondary | ICD-10-CM

## 2020-10-23 DIAGNOSIS — R531 Weakness: Secondary | ICD-10-CM

## 2020-10-23 DIAGNOSIS — T887XXA Unspecified adverse effect of drug or medicament, initial encounter: Secondary | ICD-10-CM

## 2020-10-23 HISTORY — DX: Crohn's disease of both small and large intestine with other complication: K50.818

## 2020-10-23 NOTE — Patient Instructions (Addendum)
Please come 11/03/2020 and have labs drawn, no appointment needed.  Due to recent changes in healthcare laws, you may see the results of your imaging and laboratory studies on MyChart before your provider has had a chance to review them.  We understand that in some cases there may be results that are confusing or concerning to you. Not all laboratory results come back in the same time frame and the provider may be waiting for multiple results in order to interpret others.  Please give [place] 48 hours in order for your provider to thoroughly review all the results before contacting the office for clarification of your results.    Follow up with Dr Carlean Purl on 11/05/2020 at 3:10 pm.   I appreciate the opportunity to care for you. Silvano Rusk, MD, [facility]

## 2020-10-23 NOTE — Progress Notes (Signed)
Jade Hernandez 79 y.o. 09/07/1941 [id number]  Assessment & Plan:   Encounter Diagnoses  Name Primary?  . Crohn's disease of both small and large intestine with other complication ([place]) Yes  . Loss of weight   . Weakness-mild   . Medication side effect-prednisone causing sweats     Crohn's disease of both small and large intestine with other complication ([place]) Though biopsies from her ileum and colon in March 2020 showed acute changes early IBD was certainly in the differential and she is acting like she has that.  She reminds me again today that she has a cousin who has Crohn's and so does that cousin's daughter.  I am going to let her reduce prednisone to 30 mg daily on Monday which is 3 days from now assuming everything is good or better than it is now.  I will see her back on December 22.  We will do labs on December 20 to check CBC and CMET.  Over time hopefully we can reduce the Lomotil dosage from 2 tablets to 1 tablet at a time.  I think she can liberalize her diet and just avoid foods that seem to bother her.  We will monitor this weight loss hopefully with increased appetite that seems to be developing and control of the inflammation that will improve.   CC: [name], MD   Subjective:   Chief Complaint: Follow-up of Crohn's disease  HPI Patient is here with her daughter [name] to follow-up Crohn's disease of the large and small intestine.  Patient has a long history of what was thought to be irritable bowel syndrome, colonoscopy with terminal ileoscopy in March 2020 demonstrated acute inflammatory changes in the terminal ileum on biopsy of endoscopic inflammatory changes including serpentine serpiginous ulcers, as well as mild inflammatory changes of the colon with congestion, edema erosions and erythema.  Biopsies there showed acute inflammatory changes.  She was treated with budesonide with improvement but subsequently has had recurrent signs and symptoms of diarrhea,  crampy abdominal pain poor appetite and weight loss over the past several months.  Fecal calprotectin was greater than 3000 this summer, a CT scan in September because of slow response to budesonide demonstrated ileitis and mild to moderate diffuse colonic wall thickening.  She has been treated with budesonide with slight benefit and also had a brief trial of mesalamine but this was stopped due to difficulty swallowing the pills.  And more recently changed to prednisone 40 mg daily 12 days ago.  She has been on Lomotil 1 4 times a day and then to 4 times a day.  She is feeling better with less abdominal pain but still having diarrhea up to 5 stools a day that are loose but not watery.  No severe abdominal cramps as she was having last week though still some crampy abdominal pain much better.  In the past 2 days it seems like her appetite has come back.  She is drinking 2 or 3 protein drinks a day.  She continues to lose weight which bothers her.  Having a lot of hot flash-like symptoms with the prednisone and was having these with budesonide as well.  Worse on the prednisone.  Reports that she is afraid to go outside some because she is concerned about urgent defecation and incontinence though she has not had that in some time.  Asking several questions about what she can eat i.e. dairy products, juice, olives.  Concerned that foods may make things worse.  Still feels a little weak and wobbly when she walks.  Notes that her blood pressure is lower than normal today.  Has not checked it at home in a while but can do so.  Last year she had had her metoprolol reduced from 75-50 but was mildly hypertensive and was complaining of some palpitations so Dr. Claiborne Billings put her back on 75 daily.  Husband continues to get chemotherapy for pancreatic cancer.  Wt Readings from Last 3 Encounters:  10/23/20 150 lb (68 kg)  10/01/20 156 lb (70.8 kg)  08/27/20 161 lb 8 oz (73.3 kg)  She remains concerned about her iron level  she had a reaction during an attempted Feraheme infusion she is taking oral iron.  Hemoglobin 10.9 on November 29.  Does remain microcytic.  Allergies  Allergen Reactions  . Feraheme [Ferumoxytol] Shortness Of Breath and Palpitations    Severe pain all over   Current Meds  Medication Sig  . B Complex Vitamins (B-COMPLEX/B-12 PO) Take by mouth.  . ferrous sulfate 324 MG TBEC Take 324 mg by mouth.  . metoprolol succinate (TOPROL-XL) 50 MG 24 hr tablet TAKE 1 AND 1/2 TABLETS(75 MG) BY MOUTH DAILY  . predniSONE (DELTASONE) 10 MG tablet Take 4 tablets (40 mg total) by mouth daily with breakfast.  . Probiotic Product (PROBIOTIC DAILY PO) Take 1 capsule by mouth daily.   Past Medical History:  Diagnosis Date  . Adenomatous colon polyp   . Arthritis   . Diverticulosis   . High cholesterol   . Hypertension   . IBS (irritable bowel syndrome)   . Noninfectious ileitis and colitis 07/17/2019  . Palpitations   . UTI (urinary tract infection)    Past Surgical History:  Procedure Laterality Date  . BILATERAL CARPAL TUNNEL RELEASE    . COLONOSCOPY    . NM MYOCAR PERF WALL MOTION  10/02/2007   No significant ischemia demonstrated  . 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 US ECHOCARDIOGRAPHY  10/29/2010   Proximal septal thickening noted, EF =>55%,LA mildly dilated,mild mitral annular ca+, AOV mildly sclerotic, mild AI.   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   Review of Systems As per  HPI  Objective:   Physical Exam BP (!) 100/58   Pulse 85   Ht 5\' 2"  (1.575 m)   Wt 150 lb (68 kg)   SpO2 97%   BMI 27.44 kg/m  Well-developed well-nourished elderly woman in no acute distress The abdomen is soft nontender bowel sounds present no organomegaly mass or bruits   Data reviewed previous GI notes, cardiology note from 02/02/2019, imaging i.e. CT scan from September 2021

## 2020-10-23 NOTE — Assessment & Plan Note (Signed)
Though biopsies from her ileum and colon in March 2020 showed acute changes early IBD was certainly in the differential and she is acting like she has that.  She reminds me again today that she has a cousin who has Crohn's and so does that cousin's daughter.  I am going to let her reduce prednisone to 30 mg daily on Monday which is 3 days from now assuming everything is good or better than it is now.  I will see her back on December 22.  We will do labs on December 20 to check CBC and CMET.  Over time hopefully we can reduce the Lomotil dosage from 2 tablets to 1 tablet at a time.  I think she can liberalize her diet and just avoid foods that seem to bother her.  We will monitor this weight loss hopefully with increased appetite that seems to be developing and control of the inflammation that will improve.

## 2020-10-27 ENCOUNTER — Telehealth: Payer: Self-pay | Admitting: Cardiovascular Disease

## 2020-10-27 ENCOUNTER — Telehealth: Payer: Self-pay | Admitting: Internal Medicine

## 2020-10-27 NOTE — Telephone Encounter (Signed)
I spoke with her , she has 4-5 pills left .

## 2020-10-27 NOTE — Telephone Encounter (Signed)
Pt c/o medication issue:  1. Name of Medication:metoprolol succinate (TOPROL-XL) 50 MG 24 hr tablet  2. How are you currently taking this medication (dosage and times per day)? As directed  3. Are you having a reaction (difficulty breathing--STAT)? yes  4. What is your medication issue? Patient states that her Gynecologist told her to call because her BP is dropping. BP 100/70, she states it stays around this number. Denies any symptoms.

## 2020-10-27 NOTE — Telephone Encounter (Signed)
I will check with the patient. Per my last note she was on metoprolol succinate 50 mg alternating with 75 mg every other day. If her blood pressure is low she may decrease her HCTZ. I believe she was taking 12.5 mg and this may need discontinued. She was on the metoprolol because of palpitations. With her pulse of the 80s I would not decrease her metoprolol

## 2020-10-27 NOTE — Telephone Encounter (Signed)
Spoke to patient she stated when she saw GYN her B/P low 100/70 pulse 80's.Stated she is taking Metoprolol 50 mg 1&1/2 tablets daily.Advised I will send message to [facility] for advice.

## 2020-10-28 MED ORDER — DIPHENOXYLATE-ATROPINE 2.5-0.025 MG PO TABS
ORAL_TABLET | ORAL | 1 refills | Status: DC
Start: 2020-10-28 — End: 2021-05-04

## 2020-10-28 NOTE — Telephone Encounter (Signed)
Spoke with pt and had stopped HCTZ a while back and has been taking Metoprolol 75 mg every day per pt HR has been running 80-90's . B/P yesterday was 149/85 ,130/76, and 127/68 and other times may be 100/60-70 Will forward to Dr Claiborne Billings for review and recommendations

## 2020-10-28 NOTE — Telephone Encounter (Signed)
Judi is calling to check on the status of what Dr. Claiborne Billings advises for her mother. I advised her Dr. Claiborne Billings did respond and a nurse will be reaching out to her mother in regards to it. Please advise.

## 2020-10-28 NOTE — Telephone Encounter (Signed)
I informed Jade Hernandez and she is on the 30mg  of prednisone daily. She will take the lomotil as you instructed.

## 2020-10-28 NOTE — Telephone Encounter (Signed)
I refilled it  If she is doing better - with less cramps I suggest she try to go back to 1 pill 4 times a day  She should be at 30 mg prednisone daily now as well

## 2020-10-29 NOTE — Telephone Encounter (Signed)
Follow up:    Patient calling to check the status of her last call concering some medications. Please call patient.

## 2020-10-29 NOTE — Telephone Encounter (Signed)
Spoke with the patient. She states she is no longer taking the HCTZ. She says her low blood pressure was an isolated reading that was taken at a GI appointment. BP is normally 120s to 140s. She says it's possible that she is dehydrated sometimes due to diarrhea from her Crohn's disease. Encouraged the pt to remain well hydrated, particularly on days when she is experiencing fluid loss from diarrhea.   Clarified with the pt that she is alternating 50 mg and 75 mg of Metoprolol daily. She is to log her BP daily and let us know if she is experiencing any abnormal blood pressures on this regimen.

## 2020-10-30 NOTE — Telephone Encounter (Signed)
Updated BP, patient took these 3 readings about 1:00pm  BP 140/69 HR 82  BP 119/61 HR 85  BP 117/64 HR 81   She wants to know if Dr. Claiborne Billings wants her to continue this regimen.

## 2020-10-30 NOTE — Telephone Encounter (Signed)
Called to speak with the patient. Thanked her to reporting her readings to [place]. Advised pt to continue her medications as we discussed yesterday and to continue to log her blood pressure and heart rate for the next several days. The patient verbalizes understanding and agreement with plan.

## 2020-11-03 ENCOUNTER — Other Ambulatory Visit (INDEPENDENT_AMBULATORY_CARE_PROVIDER_SITE_OTHER): Payer: Medicare Other

## 2020-11-03 DIAGNOSIS — R634 Abnormal weight loss: Secondary | ICD-10-CM

## 2020-11-03 DIAGNOSIS — K50818 Crohn's disease of both small and large intestine with other complication: Secondary | ICD-10-CM | POA: Diagnosis not present

## 2020-11-03 LAB — COMPREHENSIVE METABOLIC PANEL
ALT: 11 U/L (ref 0–35)
AST: 10 U/L (ref 0–37)
Albumin: 3 g/dL — ABNORMAL LOW (ref 3.5–5.2)
Alkaline Phosphatase: 51 U/L (ref 39–117)
BUN: 20 mg/dL (ref 6–23)
CO2: 27 mEq/L (ref 19–32)
Calcium: 8.5 mg/dL (ref 8.4–10.5)
Chloride: 104 mEq/L (ref 96–112)
Creatinine, Ser: 0.44 mg/dL (ref 0.40–1.20)
GFR: 93.1 mL/min (ref >60.00)
Glucose, Bld: 199 mg/dL — ABNORMAL HIGH (ref 70–99)
Potassium: 4.1 mEq/L (ref 3.5–5.1)
Sodium: 137 mEq/L (ref 135–145)
Total Bilirubin: 0.3 mg/dL (ref 0.2–1.2)
Total Protein: 6.2 g/dL (ref 6.0–8.3)

## 2020-11-03 LAB — CBC WITH DIFFERENTIAL/PLATELET
Basophils Absolute: 0.1 10*3/uL (ref 0.0–0.1)
Basophils Relative: 1.3 % (ref 0.0–3.0)
Eosinophils Absolute: 0 10*3/uL (ref 0.0–0.7)
Eosinophils Relative: 0 % (ref 0.0–5.0)
HCT: 35.1 % — ABNORMAL LOW (ref 36.0–46.0)
Hemoglobin: 11.1 g/dL — ABNORMAL LOW (ref 12.0–15.0)
Lymphocytes Relative: 11.4 % — ABNORMAL LOW (ref 12.0–46.0)
Lymphs Abs: 0.5 10*3/uL — ABNORMAL LOW (ref 0.7–4.0)
MCHC: 31.6 g/dL (ref 30.0–36.0)
MCV: 72.1 fl — ABNORMAL LOW (ref 78.0–100.0)
Monocytes Absolute: 0.2 10*3/uL (ref 0.1–1.0)
Monocytes Relative: 4 % (ref 3.0–12.0)
Neutro Abs: 3.4 10*3/uL (ref 1.4–7.7)
Neutrophils Relative %: 83.3 % — ABNORMAL HIGH (ref 43.0–77.0)
Platelets: 302 10*3/uL (ref 150.0–400.0)
RBC: 4.87 Mil/uL (ref 3.87–5.11)
RDW: 20.9 % — ABNORMAL HIGH (ref 11.5–15.5)
WBC: 4.1 10*3/uL (ref 4.0–10.5)

## 2020-11-03 LAB — FERRITIN: Ferritin: 69.3 ng/mL (ref 10.0–291.0)

## 2020-11-04 ENCOUNTER — Telehealth: Payer: Self-pay | Admitting: Internal Medicine

## 2020-11-04 NOTE — Telephone Encounter (Signed)
I called the patient and reviewed that her hemoglobin is slightly better her iron stores are okay she has some hyperglycemia that is related to her prednisone I think.  She reports that her stools are forming up she is having 2-3 a day often overnight but not as frequent as before and she feels improved.  She is not having any abdominal cramps.  I have explained to her to do the following:  #1 reduce prednisone to 20 mg daily starting tomorrow, I have updated med list  #2 I plan to contact her again after Christmas by phone to consider tapering the prednisone further versus reducing the Lomotil  #3 my staff will call her to arrange a follow-up appointment sooner than currently scheduled she had to have tomorrow's appointment moved in its in early February and I want to see her the week of January 10 it could be at [phone] or an open hemorrhoid banding slots

## 2020-11-04 NOTE — Telephone Encounter (Signed)
Called patient and scheduled office visit with Dr. Carlean Purl on 11/25/20

## 2020-11-05 ENCOUNTER — Ambulatory Visit: Payer: Medicare Other | Admitting: Internal Medicine

## 2020-11-10 ENCOUNTER — Telehealth: Payer: Self-pay | Admitting: Internal Medicine

## 2020-11-10 MED ORDER — PREDNISONE 5 MG PO TABS: 15.0000 mg | ORAL_TABLET | Freq: Every day | ORAL | 0 refills | Status: DC

## 2020-11-10 NOTE — Telephone Encounter (Signed)
I have sent in a new prescription for 5 mg tablets and on New Year's Day January 1 she should start taking 3 5 mg tablets daily which is 15 mg.\  Take 20 mg daily through December 31  Also see if she can take less of the diphenoxylate and atropine (the little white pill she is taking 4 times a day).  Have her try taking it at bedtime and in the morning only and see if she can back off to none or just as needed for cramps.

## 2020-11-10 NOTE — Telephone Encounter (Signed)
I spoke with Jade Hernandez, she has 7 days left of her Prednisone 10mg  if she continues taking 2qAM. She reports doing well, no fever, no diarrhea, "thicker and bigger" bowel movements-2-3 daily. She is taking the lomotil QID. Please advise Sir, thank you. She has upcoming office visit 11/25/2020.

## 2020-11-10 NOTE — Telephone Encounter (Signed)
Pt is requesting a refill on her prednisone. 

## 2020-11-11 NOTE — Telephone Encounter (Signed)
Mrs. Seltzer informed and she wrote down the instructions and read them back to me. She will go and pick up the prednisone 5mg  tablets.

## 2020-11-18 ENCOUNTER — Telehealth: Payer: Self-pay | Admitting: Cardiovascular Disease

## 2020-11-18 NOTE — Telephone Encounter (Signed)
Called to speak with patient. Thanked her for getting back to [place] with her BP readings. Encouraged her to keep taking her medication as prescribed and to log her pressures. She is to let us know if she develops any symptoms or sees her readings changing over time. The patient verbalizes understanding and agreement with plan.

## 2020-11-18 NOTE — Telephone Encounter (Signed)
Pt c/o BP issue: STAT if pt c/o blurred vision, one-sided weakness or slurred speech  1. What are your last 5 BP readings? 11/04/20 129/72 HR 84 11/11/20 133/73 HR 81 11/18/20 146/75 HR 89  2. Are you having any other symptoms (ex. Dizziness, headache, blurred vision, passed out)? No   3. What is your BP issue? Jade Hernandez is calling stating [name] requested she call in with BP readings. She is requesting a callback from her to discuss. Please advise.

## 2020-11-25 ENCOUNTER — Encounter: Payer: Self-pay | Admitting: Internal Medicine

## 2020-11-25 ENCOUNTER — Telehealth (INDEPENDENT_AMBULATORY_CARE_PROVIDER_SITE_OTHER): Payer: Medicare Other | Admitting: Internal Medicine

## 2020-11-25 DIAGNOSIS — D500 Iron deficiency anemia secondary to blood loss (chronic): Secondary | ICD-10-CM | POA: Diagnosis not present

## 2020-11-25 DIAGNOSIS — K50818 Crohn's disease of both small and large intestine with other complication: Secondary | ICD-10-CM | POA: Diagnosis not present

## 2020-11-25 NOTE — Assessment & Plan Note (Signed)
She is significantly improved at this point.  We will decrease prednisone to 10 mg daily.  I will leave her at that dose and plan to see her in 1 month.  She will call me sooner as needed otherwise.  She may liberalize her diet and avoid foods that cause symptoms.

## 2020-11-25 NOTE — Progress Notes (Signed)
TELEHEALTH ENCOUNTER IN SETTING OF COVID-19 PANDEMIC - REQUESTED BY PATIENT SERVICE PROVIDED BY TELEMEDECINE - TYPE: Telephone -patient unable to access video PATIENT LOCATION: Home PATIENT HAS CONSENTED TO TELEHEALTH VISIT PROVIDER LOCATION: OFFICE PARTICIPANTS OTHER THAN PATIENT: None TIME SPENT ON CALL: 12 minutes    Jade Hernandez 80 y.o. 01-30-1941 [id number]  Assessment & Plan:   Crohn's disease of both small and large intestine with other complication ([place]) She is significantly improved at this point.  We will decrease prednisone to 10 mg daily.  I will leave her at that dose and plan to see her in 1 month.  She will call me sooner as needed otherwise.  She may liberalize her diet and avoid foods that cause symptoms.  Iron deficiency anemia due to chronic blood loss Lab Results  Component Value Date   WBC 4.1 11/03/2020   HGB 11.1 (L) 11/03/2020   HCT 35.1 (L) 11/03/2020   MCV 72.1 (L) 11/03/2020   PLT 302.0 11/03/2020   Lab Results  Component Value Date   FERRITIN 69.3 11/03/2020   Continue daily ferrous sulfate for now to build up reserve of iron and improve hemoglobin.  She has a physical with Dr. Brigitte Hernandez coming up and I will see what those labs show or I will order other ones when she returns to see me.  I appreciate the opportunity to care for this patient. CC: Jade Neer, MD   Subjective:   Chief Complaint: Follow-up of Crohn's disease  HPI The patient is being followed up for Crohn's disease of the ileum and colon.  She had a tough time for a while but now that we have her on prednisone she is doing much better her weight is up a few pounds she is eating better and is not having any diarrhea.  Using about 1 Lomotil a day on an as-needed basis.  No other antidiarrheals.  She is not having pain.  She is generally feeling much better.  We were going to see her in person but 3 of her grandchildren and her daughter have COVID and I think she was exposed  but the patient is not sick.   Allergies  Allergen Reactions  . Feraheme [Ferumoxytol] Shortness Of Breath and Palpitations    Severe pain all over   Current Meds  Medication Sig  . B Complex Vitamins (B-COMPLEX/B-12 PO) Take by mouth.  . diphenoxylate-atropine (LOMOTIL) 2.5-0.025 MG tablet 1-2 tablets 4 times a day (Patient taking differently: 1 tablet 4 (four) times daily as needed. 1-2 tablets 4 times a day)  . ferrous sulfate 324 MG TBEC Take 324 mg by mouth.  . metoprolol succinate (TOPROL-XL) 50 MG 24 hr tablet TAKE 1 AND 1/2 TABLETS(75 MG) BY MOUTH DAILY (Patient taking differently: TAKE 1 AND 1/2 TABLETS(75 MG) BY MOUTH DAILY ALTERNATING WITH 1 TABLET DAILY)  . predniSONE (DELTASONE) 5 MG tablet Take 3 tablets (15 mg total) by mouth daily with breakfast.  . Probiotic Product (PROBIOTIC DAILY PO) Take 1 capsule by mouth daily.   Past Medical History:  Diagnosis Date  . Adenomatous colon polyp   . Arthritis   . Crohn's disease of both small and large intestine with other complication ([place]) 32/07/5187  . Diverticulosis   . High cholesterol   . Hypertension   . IBS (irritable bowel syndrome)   . Palpitations   . UTI (urinary tract infection)    Past Surgical History:  Procedure Laterality Date  . BILATERAL CARPAL TUNNEL RELEASE    .  COLONOSCOPY    . NM MYOCAR PERF WALL MOTION  10/02/2007   No significant ischemia demonstrated  . 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 US ECHOCARDIOGRAPHY  10/29/2010   Proximal septal thickening noted, EF =>55%,LA mildly dilated,mild mitral annular ca+, AOV mildly sclerotic, mild AI.   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   Review of Systems She is tolerating her metoprolol with good blood pressures.  She is taking alternating doses of 75/50 mg.

## 2020-11-25 NOTE — Assessment & Plan Note (Signed)
Lab Results  Component Value Date   WBC 4.1 11/03/2020   HGB 11.1 (L) 11/03/2020   HCT 35.1 (L) 11/03/2020   MCV 72.1 (L) 11/03/2020   PLT 302.0 11/03/2020   Lab Results  Component Value Date   FERRITIN 69.3 11/03/2020   Continue daily ferrous sulfate for now to build up reserve of iron and improve hemoglobin.  She has a physical with Dr. Brigitte Pulse coming up and I will see what those labs show or I will order other ones when she returns to see me.

## 2020-11-25 NOTE — Patient Instructions (Signed)
I am so glad you are feeling better.  As we discussed reduce the prednisone to 10 mg daily.  We will get you a follow-up visit for about 1 month from now.  If anything changes or you have questions in between please do not hesitate to contact me.  I appreciate the opportunity to care for you. Gatha Mayer, MD, [facility]

## 2020-11-27 NOTE — Telephone Encounter (Signed)
Blood pressures look pretty well controlled on average. ->  Not updated here to see if we would need to adjust.  Would continue to monitor   Glenetta Hew, MD

## 2020-12-08 IMAGING — DX DG CHEST 2V
2 series · 2 of 2 positions shown · non-contrast
Comparison: No prior.

CLINICAL DATA: Bilateral lower extremity edema.

EXAM:
CHEST - 2 VIEW

[dg chest 2 view (1 of 2)]
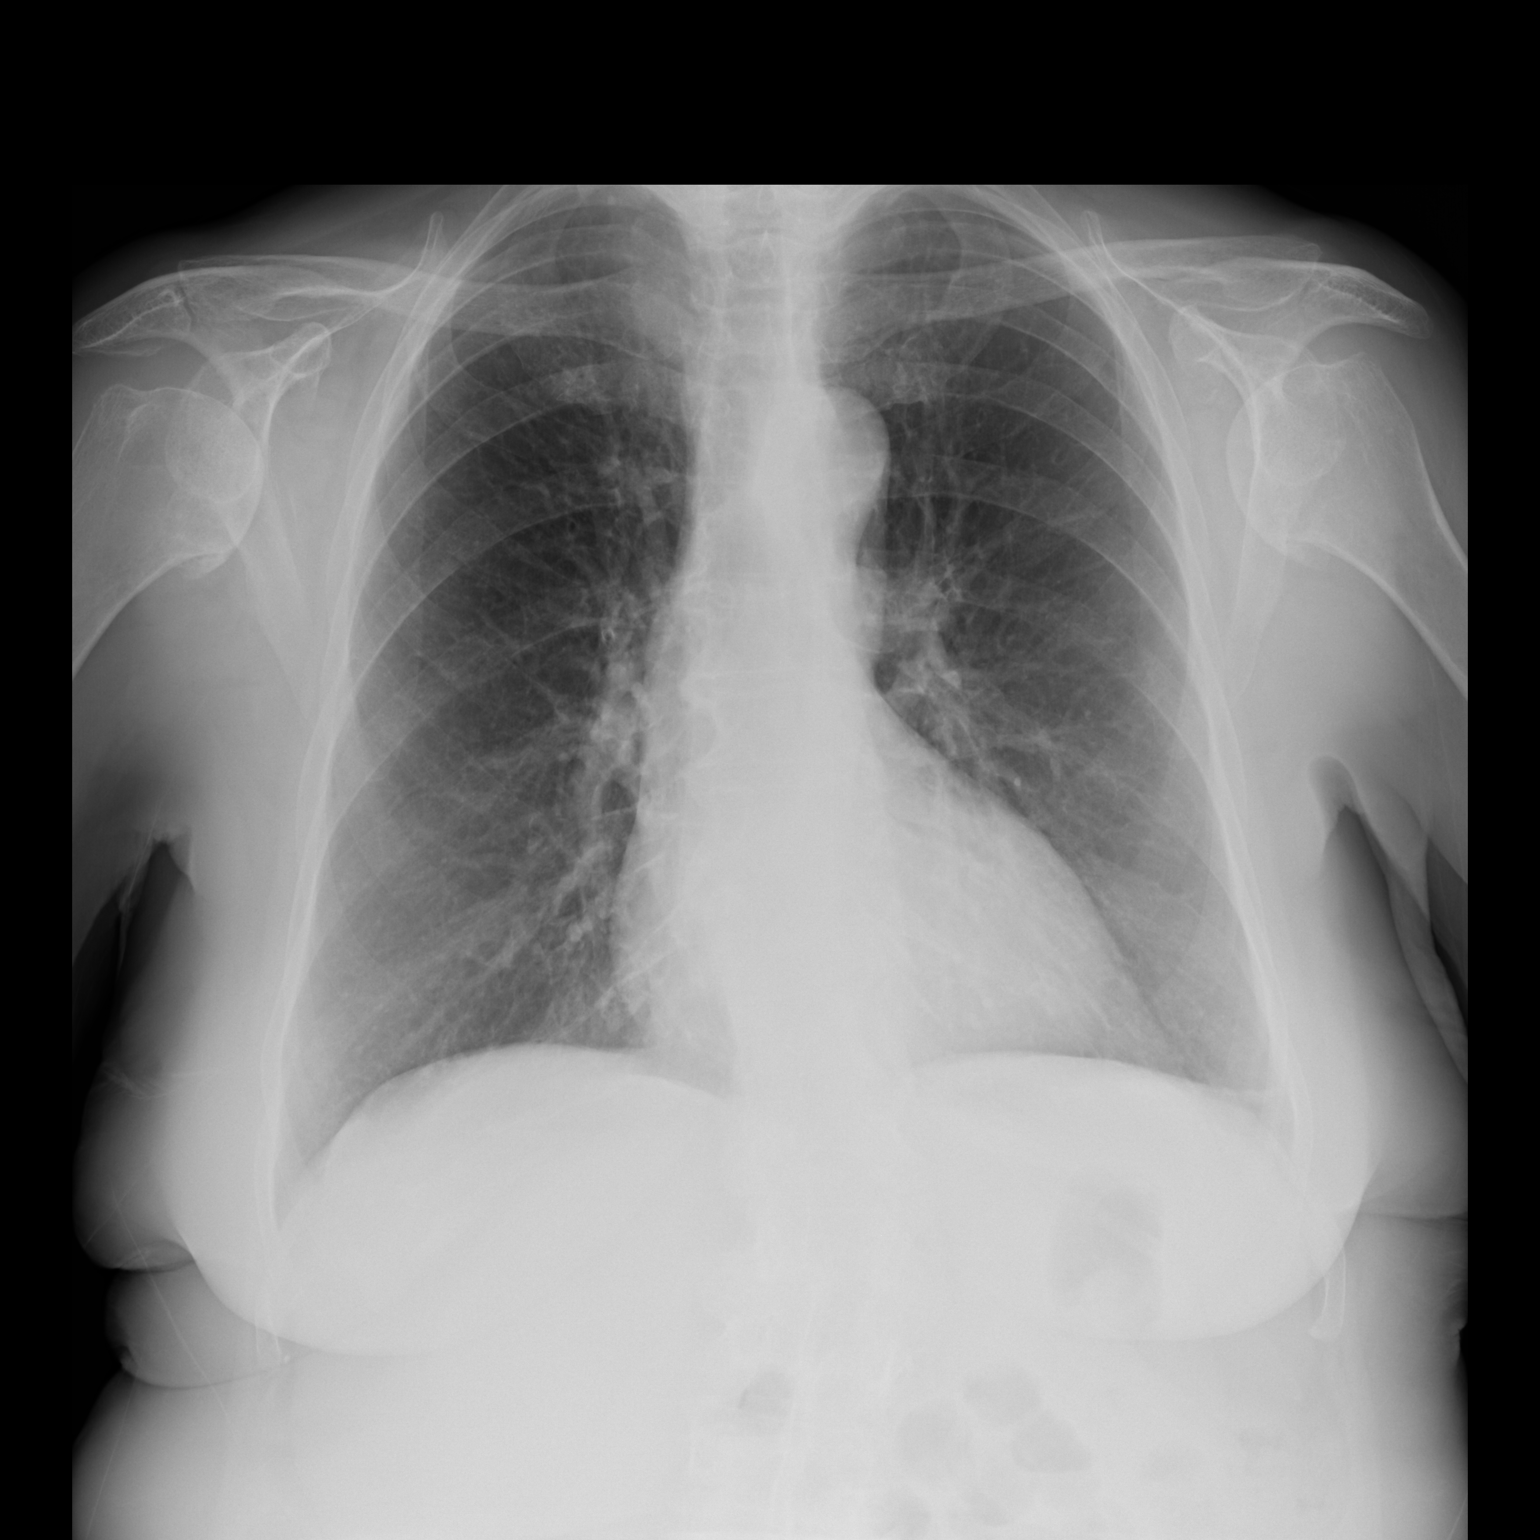

[dg chest 2 view (2 of 2)]
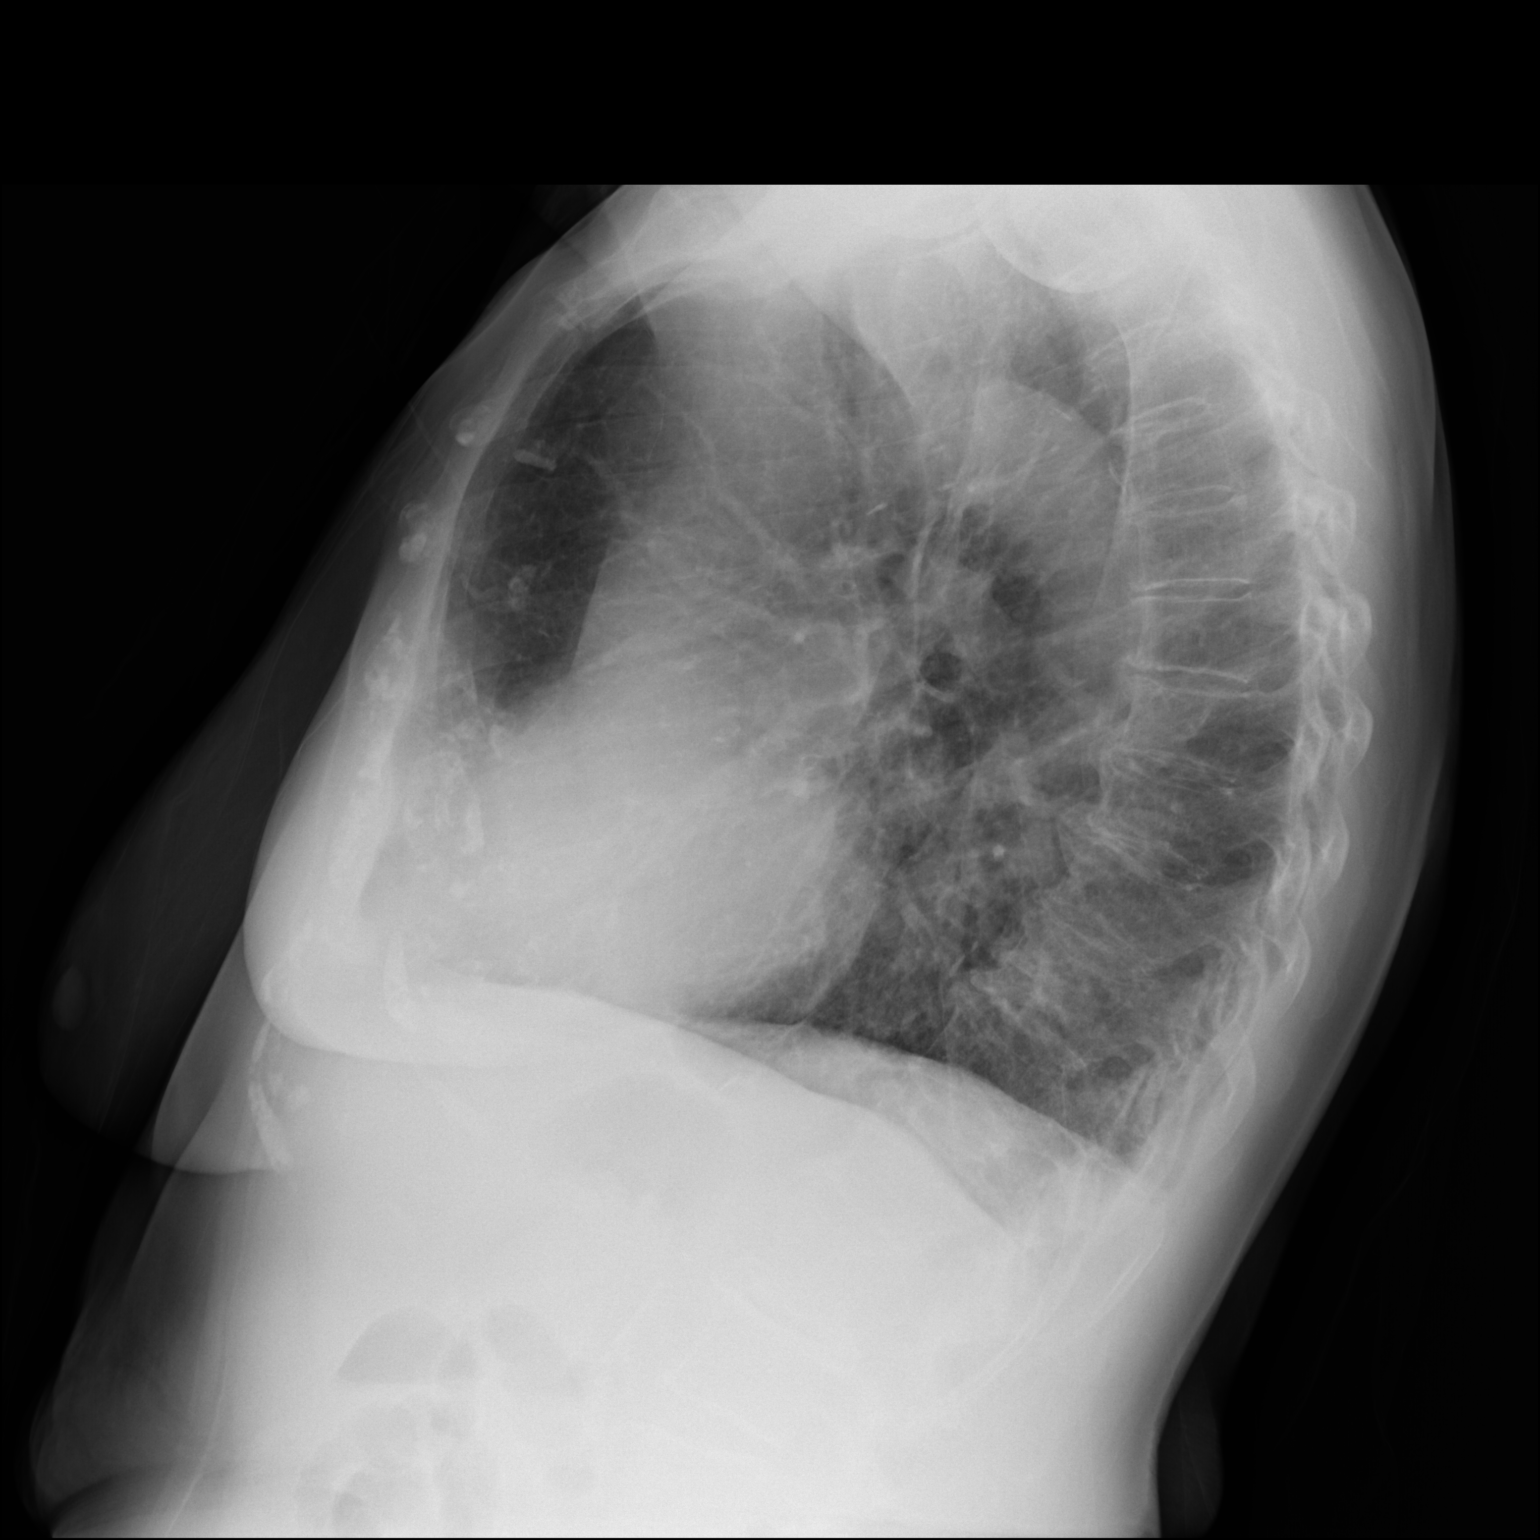

[2 of 2 positions shown; findings below may reference images not displayed]

FINDINGS: Mediastinum and hilar structures normal. Borderline cardiomegaly, no
pulmonary venous congestion. Mild left base subsegmental
atelectasis. No focal infiltrate. No pleural effusion or
pneumothorax. Degenerative changes scoliosis thoracic spine.
Degenerative changes both shoulders.
IMPRESSION: 1. Borderline cardiomegaly, no pulmonary venous congestion.
2. Mild left base subsegmental atelectasis.

## 2020-12-15 DIAGNOSIS — M1990 Unspecified osteoarthritis, unspecified site: Secondary | ICD-10-CM | POA: Diagnosis not present

## 2020-12-15 DIAGNOSIS — M899 Disorder of bone, unspecified: Secondary | ICD-10-CM | POA: Diagnosis not present

## 2020-12-15 DIAGNOSIS — D509 Iron deficiency anemia, unspecified: Secondary | ICD-10-CM | POA: Diagnosis not present

## 2020-12-15 DIAGNOSIS — I10 Essential (primary) hypertension: Secondary | ICD-10-CM | POA: Diagnosis not present

## 2020-12-15 DIAGNOSIS — E782 Mixed hyperlipidemia: Secondary | ICD-10-CM | POA: Diagnosis not present

## 2020-12-15 DIAGNOSIS — Z0000 Encounter for general adult medical examination without abnormal findings: Secondary | ICD-10-CM | POA: Diagnosis not present

## 2020-12-15 DIAGNOSIS — K50019 Crohn's disease of small intestine with unspecified complications: Secondary | ICD-10-CM | POA: Diagnosis not present

## 2020-12-16 ENCOUNTER — Ambulatory Visit: Payer: Medicare Other | Admitting: Internal Medicine

## 2020-12-31 DIAGNOSIS — Z23 Encounter for immunization: Secondary | ICD-10-CM | POA: Diagnosis not present

## 2021-01-06 ENCOUNTER — Encounter: Payer: Self-pay | Admitting: Internal Medicine

## 2021-01-06 ENCOUNTER — Other Ambulatory Visit (INDEPENDENT_AMBULATORY_CARE_PROVIDER_SITE_OTHER): Payer: Medicare Other

## 2021-01-06 ENCOUNTER — Ambulatory Visit (INDEPENDENT_AMBULATORY_CARE_PROVIDER_SITE_OTHER): Payer: Medicare Other | Admitting: Internal Medicine

## 2021-01-06 VITALS — BP 130/64 | HR 88 | Ht 63.0 in | Wt 162.0 lb

## 2021-01-06 DIAGNOSIS — D500 Iron deficiency anemia secondary to blood loss (chronic): Secondary | ICD-10-CM

## 2021-01-06 DIAGNOSIS — Z7952 Long term (current) use of systemic steroids: Secondary | ICD-10-CM | POA: Diagnosis not present

## 2021-01-06 DIAGNOSIS — K50818 Crohn's disease of both small and large intestine with other complication: Secondary | ICD-10-CM | POA: Diagnosis not present

## 2021-01-06 DIAGNOSIS — K529 Noninfective gastroenteritis and colitis, unspecified: Secondary | ICD-10-CM | POA: Diagnosis not present

## 2021-01-06 LAB — CBC WITH DIFFERENTIAL/PLATELET
Basophils Absolute: 0.1 10*3/uL (ref 0.0–0.1)
Basophils Relative: 1.5 % (ref 0.0–3.0)
Eosinophils Absolute: 0 10*3/uL (ref 0.0–0.7)
Eosinophils Relative: 0.5 % (ref 0.0–5.0)
HCT: 40 % (ref 36.0–46.0)
Hemoglobin: 13 g/dL (ref 12.0–15.0)
Lymphocytes Relative: 22.2 % (ref 12.0–46.0)
Lymphs Abs: 1.3 10*3/uL (ref 0.7–4.0)
MCHC: 32.5 g/dL (ref 30.0–36.0)
MCV: 78.2 fl (ref 78.0–100.0)
Monocytes Absolute: 0.4 10*3/uL (ref 0.1–1.0)
Monocytes Relative: 6.7 % (ref 3.0–12.0)
Neutro Abs: 4.1 10*3/uL (ref 1.4–7.7)
Neutrophils Relative %: 69.1 % (ref 43.0–77.0)
Platelets: 226 10*3/uL (ref 150.0–400.0)
RBC: 5.11 Mil/uL (ref 3.87–5.11)
RDW: 18.5 % — ABNORMAL HIGH (ref 11.5–15.5)
WBC: 5.9 10*3/uL (ref 4.0–10.5)

## 2021-01-06 LAB — FERRITIN: Ferritin: 18.9 ng/mL (ref 10.0–291.0)

## 2021-01-06 NOTE — Progress Notes (Signed)
Jade Hernandez 80 y.o. Jul 24, 1941 [id number]  Assessment & Plan:   Crohn's disease of both small and large intestine with other complication ([place]) She continues to improve.  We had a discussion about the need for Biologics plus or minus immunomodulators and reviewed potential options for Entyvio, Humira plus immunomodulators.  She needs to think about this and I have given her information to read and have explained the potential risks benefits and indications to the best of my ability in the presence of her daughter.  I again reiterated that Crohn's disease is a chronic illness.  Sometimes we do just use steroids but would prefer not to given potential for long-term complications.  Certainly insurance coverage will come into play with respect to biologic treatment.  Orders Placed This Encounter  Procedures  . Thiopurine methyltransferase(tpmt)rbc  . CBC with Differential/Platelet  . Ferritin  . Hepatitis B core antibody, total  . Hepatitis B surface antigen  . QuantiFERON-TB Gold Plus   Laboratory studies as above and when results are in we will have conversation with patient and family regarding next steps.  Follow-up Apr 09, 2021.  She may liberalize her diet further though I have given her a Crohn's diet pamphlet for further help with this.  Iron deficiency anemia due to chronic blood loss On therapy, this has been improving reassess with CBC  Long term (current) use of systemic steroids Reviewed reasons to not use these longer than we have to, it has been necessary to get her disease under control given her current situation and we plan to pursue biologic treatment as discussed in the Crohn's assessment and plan.  Consider vitamin D testing, DEXA scanning if not done.  I am hopeful we can wean her very soon and start Biologics and keep her off steroids.   I appreciate the opportunity to care for this patient. CC: Jade Neer, MD     Subjective:   Chief Complaint:  Follow-up of Crohn's disease HPI The patient has a diagnosis of Crohn's disease of the large and small intestine and is on prednisone 5 mg a day doing much better overall with only occasional diarrhea.  She presents with her daughter today.  She has gained weight appetite is back in general he feels much better.  She is not suffering any major side effects of the steroids though she does have some night sweats those are better.  Liberalizing her diet would like to liberalize more. She had chemistries performed at Dr. Jackie Hernandez office, and albumin 3.3, transaminases normal, kidney function chemistries otherwise normal on December 15, 2020. Wt Readings from Last 3 Encounters:  01/06/21 162 lb (73.5 kg)  10/23/20 150 lb (68 kg)  10/01/20 156 lb (70.8 kg)   Social history pertinent for husband struggling with pancreatic cancer and in decline.  He is on therapy.  Allergies  Allergen Reactions  . Feraheme [Ferumoxytol] Shortness Of Breath and Palpitations    Severe pain all over   Current Meds  Medication Sig  . B Complex Vitamins (B-COMPLEX/B-12 PO) Take by mouth.  . diphenoxylate-atropine (LOMOTIL) 2.5-0.025 MG tablet 1-2 tablets 4 times a day (Patient taking differently: 1 tablet 4 (four) times daily as needed. 1-2 tablets 4 times a day)  . ferrous sulfate 324 MG TBEC Take 324 mg by mouth.  . metoprolol succinate (TOPROL-XL) 50 MG 24 hr tablet TAKE 1 AND 1/2 TABLETS(75 MG) BY MOUTH DAILY (Patient taking differently: TAKE 1 AND 1/2 TABLETS(75 MG) BY MOUTH DAILY ALTERNATING WITH  1 TABLET DAILY)  . predniSONE (DELTASONE) 5 MG tablet Take 5 mg by mouth daily with breakfast.  . Probiotic Product (PROBIOTIC DAILY PO) Take 1 capsule by mouth daily.  . Vitamin D, Ergocalciferol, 50 MCG (2000 UT) CAPS Take 1 capsule by mouth daily.  . [DISCONTINUED] predniSONE (DELTASONE) 5 MG tablet Take 3 tablets (15 mg total) by mouth daily with breakfast. (Patient taking differently: Take 5 mg by mouth daily  with breakfast.)   Past Medical History:  Diagnosis Date  . Adenomatous colon polyp   . Arthritis   . Crohn's disease of both small and large intestine with other complication ([place]) 82/07/5620  . Diverticulosis   . High cholesterol   . Hypertension   . IBS (irritable bowel syndrome)   . Palpitations   . UTI (urinary tract infection)    Past Surgical History:  Procedure Laterality Date  . BILATERAL CARPAL TUNNEL RELEASE    . COLONOSCOPY    . NM MYOCAR PERF WALL MOTION  10/02/2007   No significant ischemia demonstrated  . 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 US ECHOCARDIOGRAPHY  10/29/2010   Proximal septal thickening noted, EF =>55%,LA mildly dilated,mild mitral annular ca+, AOV mildly sclerotic, mild AI.   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   Review of Systems As per HPI  Objective:   Physical Exam BP 130/64   Pulse 88   Ht 5\' 3"  (1.6 m)   Wt 162 lb (73.5 kg)   BMI 28.70 kg/m  Well-developed well-nourished elderly woman in no acute distress Eyes anicteric Abdomen soft nontender benign Appropriate mood and affect alert oriented x3

## 2021-01-06 NOTE — Patient Instructions (Signed)
Your provider has requested that you go to the basement level for lab work before leaving today. Press "B" on the elevator. The lab is located at the first door on the left as you exit the elevator.  Due to recent changes in healthcare laws, you may see the results of your imaging and laboratory studies on MyChart before your provider has had a chance to review them.  We understand that in some cases there may be results that are confusing or concerning to you. Not all laboratory results come back in the same time frame and the provider may be waiting for multiple results in order to interpret others.  Please give [place] 48 hours in order for your provider to thoroughly review all the results before contacting the office for clarification of your results.   Stay on your current medication.  We are providing you with crohn's diet information.  Follow up with Dr Carlean Purl in May please.   I appreciate the opportunity to care for you. Silvano Rusk, MD, [facility]

## 2021-01-11 DIAGNOSIS — Z7952 Long term (current) use of systemic steroids: Secondary | ICD-10-CM | POA: Insufficient documentation

## 2021-01-11 NOTE — Assessment & Plan Note (Addendum)
She continues to improve.  We had a discussion about the need for Biologics plus or minus immunomodulators and reviewed potential options for Entyvio, Humira plus immunomodulators.  She needs to think about this and I have given her information to read and have explained the potential risks benefits and indications to the best of my ability in the presence of her daughter.  I again reiterated that Crohn's disease is a chronic illness.  Sometimes we do just use steroids but would prefer not to given potential for long-term complications.  Certainly insurance coverage will come into play with respect to biologic treatment.  Orders Placed This Encounter  Procedures  . Thiopurine methyltransferase(tpmt)rbc  . CBC with Differential/Platelet  . Ferritin  . Hepatitis B core antibody, total  . Hepatitis B surface antigen  . QuantiFERON-TB Gold Plus   Laboratory studies as above and when results are in we will have conversation with patient and family regarding next steps.  Follow-up Apr 09, 2021.  She may liberalize her diet further though I have given her a Crohn's diet pamphlet for further help with this.

## 2021-01-11 NOTE — Assessment & Plan Note (Signed)
On therapy, this has been improving reassess with CBC

## 2021-01-11 NOTE — Assessment & Plan Note (Signed)
Reviewed reasons to not use these longer than we have to, it has been necessary to get her disease under control given her current situation and we plan to pursue biologic treatment as discussed in the Crohn's assessment and plan.  Consider vitamin D testing, DEXA scanning if not done.  I am hopeful we can wean her very soon and start Biologics and keep her off steroids.

## 2021-01-20 ENCOUNTER — Telehealth: Payer: Self-pay | Admitting: Internal Medicine

## 2021-01-20 DIAGNOSIS — L280 Lichen simplex chronicus: Secondary | ICD-10-CM | POA: Diagnosis not present

## 2021-01-20 MED ORDER — PREDNISONE 5 MG PO TABS: 5.0000 mg | ORAL_TABLET | Freq: Every day | ORAL | 0 refills | Status: DC

## 2021-01-20 NOTE — Telephone Encounter (Signed)
OK to refill

## 2021-01-20 NOTE — Telephone Encounter (Signed)
Prednisone refilled and patient's daughter informed.

## 2021-01-20 NOTE — Telephone Encounter (Signed)
May I refill Sir? 

## 2021-01-20 NOTE — Telephone Encounter (Signed)
Pt needs to rf prednisone 5 mg. Pls send it to [facility] on [address].

## 2021-01-22 LAB — HEPATITIS B SURFACE ANTIGEN: Hepatitis B Surface Ag: NONREACTIVE

## 2021-01-22 LAB — QUANTIFERON-TB GOLD PLUS
Mitogen-NIL: 9.16 IU/mL
NIL: 0.02 IU/mL
QuantiFERON-TB Gold Plus: NEGATIVE
TB1-NIL: 0 IU/mL
TB2-NIL: 0 IU/mL

## 2021-01-22 LAB — HEPATITIS B CORE ANTIBODY, TOTAL: Hep B Core Total Ab: NONREACTIVE

## 2021-01-22 LAB — THIOPURINE METHYLTRANSFERASE (TPMT), RBC: Thiopurine Methyltransferase, RBC: 10 nmol/hr/mL RBC — ABNORMAL LOW

## 2021-02-03 ENCOUNTER — Telehealth: Payer: Self-pay | Admitting: Internal Medicine

## 2021-02-04 ENCOUNTER — Other Ambulatory Visit: Payer: Self-pay | Admitting: Internal Medicine

## 2021-02-04 NOTE — Telephone Encounter (Addendum)
Medicare and Supplement Coverage.  No authorization required. Medicare covers 80% of the infusion ENTYVIO (J3380) and BC[employer] supplement covers 20% that was not paid for by MEDICARE. Medicare B deductible is 233$.   11/11/22 - Medicare part b and supplement eligibility verified and approval extended till 11/15/23

## 2021-02-04 NOTE — Telephone Encounter (Signed)
Patient seen in person while husband had visit yesterday.  She let me know she is ready proceed with Entyvio treatment for her Crohn's disease.  I have entered the orders for the infusion at the [facility].

## 2021-02-04 NOTE — Telephone Encounter (Signed)
Dr. Carlean Purl, did you see that the location name has changed?  Did you enter it for [facility] ?

## 2021-02-04 NOTE — Telephone Encounter (Signed)
Yes

## 2021-02-10 ENCOUNTER — Telehealth: Payer: Self-pay | Admitting: Gastroenterology

## 2021-02-16 DIAGNOSIS — Z7960 Long term (current) use of unspecified immunomodulators and immunosuppressants: Secondary | ICD-10-CM | POA: Insufficient documentation

## 2021-02-19 ENCOUNTER — Other Ambulatory Visit: Payer: Self-pay

## 2021-02-19 ENCOUNTER — Ambulatory Visit (INDEPENDENT_AMBULATORY_CARE_PROVIDER_SITE_OTHER): Payer: Medicare Other

## 2021-02-19 VITALS — BP 181/80 | HR 76 | Temp 98.3°F | Resp 20 | Ht 63.0 in | Wt 167.4 lb

## 2021-02-19 DIAGNOSIS — K50818 Crohn's disease of both small and large intestine with other complication: Secondary | ICD-10-CM

## 2021-02-19 MED ORDER — SODIUM CHLORIDE 0.9 % IV SOLN: Freq: Once | INTRAVENOUS | Status: DC | PRN

## 2021-02-19 MED ORDER — VEDOLIZUMAB 300 MG IV SOLR
300.0000 mg | Freq: Once | INTRAVENOUS | Status: AC
  Administered 2021-02-19: 300 mg via INTRAVENOUS
  Filled 2021-02-19: qty 5

## 2021-02-19 MED ORDER — EPINEPHRINE 0.3 MG/0.3ML IJ SOAJ: 0.3000 mg | Freq: Once | INTRAMUSCULAR | Status: DC | PRN

## 2021-02-19 MED ORDER — FAMOTIDINE IN NACL 20-0.9 MG/50ML-% IV SOLN: 20.0000 mg | Freq: Once | INTRAVENOUS | Status: DC | PRN

## 2021-02-19 MED ORDER — DIPHENHYDRAMINE HCL 50 MG/ML IJ SOLN: 50.0000 mg | Freq: Once | INTRAMUSCULAR | Status: DC | PRN

## 2021-02-19 MED ORDER — ALBUTEROL SULFATE HFA 108 (90 BASE) MCG/ACT IN AERS: 2.0000 | INHALATION_SPRAY | Freq: Once | RESPIRATORY_TRACT | Status: DC | PRN

## 2021-02-19 MED ORDER — VEDOLIZUMAB 300 MG IV SOLR: 300.0000 mg | Freq: Once | INTRAVENOUS | Status: DC

## 2021-02-19 MED ORDER — METHYLPREDNISOLONE SODIUM SUCC 125 MG IJ SOLR: 125.0000 mg | Freq: Once | INTRAMUSCULAR | Status: DC | PRN

## 2021-02-19 NOTE — Progress Notes (Signed)
Diagnosis: Crohn's Disease  Provider:  Marshell Garfinkel, MD  Procedure: Infusion  IV Type: Peripheral, IV Location: R Antecubital  Entyvio (Vedolizumab), Dose: 500 mgs  Infusion Start Time: 6063  Infusion Stop Time: 1500  Post Infusion IV Care: Observation period completed  Discharge: Condition: Good, Destination: Home . AVS provided to patient.   Performed by:  Paul Dykes, RN

## 2021-02-23 ENCOUNTER — Telehealth: Payer: Self-pay | Admitting: Internal Medicine

## 2021-02-23 DIAGNOSIS — Z6830 Body mass index (BMI) 30.0-30.9, adult: Secondary | ICD-10-CM | POA: Diagnosis not present

## 2021-02-23 DIAGNOSIS — Z01419 Encounter for gynecological examination (general) (routine) without abnormal findings: Secondary | ICD-10-CM | POA: Diagnosis not present

## 2021-02-23 MED ORDER — PREDNISONE 5 MG PO TABS: 5.0000 mg | ORAL_TABLET | Freq: Every day | ORAL | 0 refills | Status: DC

## 2021-02-23 NOTE — Telephone Encounter (Signed)
Has about 1 week of 5 mg tablets of prednisone left.  Needs a refill.  Occasional diarrhea lately.  She took Lomotil the day of the call.  Was going to take it tonight as well.  I recommend use it as needed.  If she has an uptick in diarrhea we may need to increase her prednisone dose.  She has started Entyvio.  Prednisone refilled.

## 2021-03-05 ENCOUNTER — Other Ambulatory Visit: Payer: Self-pay

## 2021-03-05 ENCOUNTER — Ambulatory Visit (INDEPENDENT_AMBULATORY_CARE_PROVIDER_SITE_OTHER): Payer: Medicare Other

## 2021-03-05 VITALS — BP 164/84 | HR 68 | Temp 98.0°F | Resp 18

## 2021-03-05 DIAGNOSIS — K50818 Crohn's disease of both small and large intestine with other complication: Secondary | ICD-10-CM

## 2021-03-05 MED ORDER — VEDOLIZUMAB 300 MG IV SOLR
300.0000 mg | Freq: Once | INTRAVENOUS | Status: AC
  Administered 2021-03-05: 300 mg via INTRAVENOUS
  Filled 2021-03-05: qty 5

## 2021-03-05 NOTE — Progress Notes (Signed)
Diagnosis: Crohn's Disease  Provider:  Marshell Garfinkel, MD  Procedure: Infusion  IV Type: Peripheral, IV Location: L Antecubital  Entyvio (Vedolizumab), Dose: 300 mg  Infusion Start Time: 8682  Infusion Stop Time: 1502  Post Infusion IV Care: Peripheral IV Discontinued  Discharge: Condition: Good, Destination: Home . AVS provided to patient.   Performed by:  Koren Shiver, RN

## 2021-03-13 DIAGNOSIS — M67911 Unspecified disorder of synovium and tendon, right shoulder: Secondary | ICD-10-CM | POA: Diagnosis not present

## 2021-03-13 DIAGNOSIS — M19011 Primary osteoarthritis, right shoulder: Secondary | ICD-10-CM | POA: Diagnosis not present

## 2021-03-16 ENCOUNTER — Telehealth: Payer: Self-pay | Admitting: Internal Medicine

## 2021-03-16 MED ORDER — PREDNISONE 5 MG PO TABS: 5.0000 mg | ORAL_TABLET | Freq: Every day | ORAL | 0 refills | Status: DC

## 2021-03-16 NOTE — Telephone Encounter (Signed)
Refilled Prednisone

## 2021-03-19 ENCOUNTER — Ambulatory Visit: Payer: Medicare Other | Attending: Internal Medicine

## 2021-03-19 ENCOUNTER — Other Ambulatory Visit: Payer: Self-pay

## 2021-03-19 ENCOUNTER — Other Ambulatory Visit (HOSPITAL_BASED_OUTPATIENT_CLINIC_OR_DEPARTMENT_OTHER): Payer: Self-pay

## 2021-03-19 DIAGNOSIS — Z23 Encounter for immunization: Secondary | ICD-10-CM

## 2021-03-19 MED ORDER — COVID-19 MRNA VACC (MODERNA) 100 MCG/0.5ML IM SUSP
INTRAMUSCULAR | 0 refills | Status: DC
  Filled 2021-03-19: qty 0.25, 1d supply, fill #0

## 2021-03-19 NOTE — Progress Notes (Signed)
   Covid-19 Vaccination Clinic  Name:  ELASIA FURNISH    MRN: [id number] DOB: July 30, 1941  03/19/2021  Ms. Edinger was observed post Covid-19 immunization for 15 minutes without incident. She was provided with Vaccine Information Sheet and instruction to access the V-Safe system.   Ms. Haberl was instructed to call 911 with any severe reactions post vaccine: Marland Kitchen Difficulty breathing  . Swelling of face and throat  . A fast heartbeat  . A bad rash all over body  . Dizziness and weakness   Immunizations Administered    Name Date Dose VIS Date Route   Moderna Covid-19 Booster Vaccine 03/19/2021  1:06 PM 0.25 mL 09/03/2020 Intramuscular   Manufacturer: Moderna   Lot: [id number]   [place]: [id number]

## 2021-03-25 DIAGNOSIS — R7301 Impaired fasting glucose: Secondary | ICD-10-CM | POA: Diagnosis not present

## 2021-03-25 DIAGNOSIS — K50019 Crohn's disease of small intestine with unspecified complications: Secondary | ICD-10-CM | POA: Diagnosis not present

## 2021-03-25 DIAGNOSIS — D509 Iron deficiency anemia, unspecified: Secondary | ICD-10-CM | POA: Diagnosis not present

## 2021-03-25 DIAGNOSIS — H6120 Impacted cerumen, unspecified ear: Secondary | ICD-10-CM | POA: Diagnosis not present

## 2021-03-25 DIAGNOSIS — I10 Essential (primary) hypertension: Secondary | ICD-10-CM | POA: Diagnosis not present

## 2021-03-25 DIAGNOSIS — E782 Mixed hyperlipidemia: Secondary | ICD-10-CM | POA: Diagnosis not present

## 2021-04-02 ENCOUNTER — Other Ambulatory Visit: Payer: Self-pay

## 2021-04-02 ENCOUNTER — Ambulatory Visit (INDEPENDENT_AMBULATORY_CARE_PROVIDER_SITE_OTHER): Payer: Medicare Other

## 2021-04-02 VITALS — BP 164/79 | HR 63 | Temp 97.7°F | Resp 16

## 2021-04-02 DIAGNOSIS — K50818 Crohn's disease of both small and large intestine with other complication: Secondary | ICD-10-CM | POA: Diagnosis not present

## 2021-04-02 MED ORDER — ALBUTEROL SULFATE HFA 108 (90 BASE) MCG/ACT IN AERS: 2.0000 | INHALATION_SPRAY | Freq: Once | RESPIRATORY_TRACT | Status: DC | PRN

## 2021-04-02 MED ORDER — SODIUM CHLORIDE 0.9 % IV SOLN: Freq: Once | INTRAVENOUS | Status: DC | PRN

## 2021-04-02 MED ORDER — FAMOTIDINE IN NACL 20-0.9 MG/50ML-% IV SOLN: 20.0000 mg | Freq: Once | INTRAVENOUS | Status: DC | PRN

## 2021-04-02 MED ORDER — DIPHENHYDRAMINE HCL 50 MG/ML IJ SOLN: 50.0000 mg | Freq: Once | INTRAMUSCULAR | Status: DC | PRN

## 2021-04-02 MED ORDER — VEDOLIZUMAB 300 MG IV SOLR
300.0000 mg | Freq: Once | INTRAVENOUS | Status: AC
  Administered 2021-04-02: 300 mg via INTRAVENOUS
  Filled 2021-04-02: qty 5

## 2021-04-02 MED ORDER — METHYLPREDNISOLONE SODIUM SUCC 125 MG IJ SOLR: 125.0000 mg | Freq: Once | INTRAMUSCULAR | Status: DC | PRN

## 2021-04-02 MED ORDER — EPINEPHRINE 0.3 MG/0.3ML IJ SOAJ: 0.3000 mg | Freq: Once | INTRAMUSCULAR | Status: DC | PRN

## 2021-04-02 NOTE — Progress Notes (Signed)
Diagnosis: Crohn's Disease  Provider:  Marshell Garfinkel, MD  Procedure: Infusion  IV Type: Peripheral, IV Location: L Forearm  Entyvio (Vedolizumab), Dose: 300 mg  Infusion Start Time: 9532  Infusion Stop Time: 1525  Post Infusion IV Care: Observation period completed  Discharge: Condition: Good, Destination: Home . AVS provided to patient.   Performed by:  Paul Dykes, RN

## 2021-04-09 ENCOUNTER — Ambulatory Visit (INDEPENDENT_AMBULATORY_CARE_PROVIDER_SITE_OTHER): Payer: Medicare Other | Admitting: Internal Medicine

## 2021-04-09 ENCOUNTER — Encounter: Payer: Self-pay | Admitting: Internal Medicine

## 2021-04-09 DIAGNOSIS — D500 Iron deficiency anemia secondary to blood loss (chronic): Secondary | ICD-10-CM

## 2021-04-09 DIAGNOSIS — Z7952 Long term (current) use of systemic steroids: Secondary | ICD-10-CM | POA: Diagnosis not present

## 2021-04-09 DIAGNOSIS — K50818 Crohn's disease of both small and large intestine with other complication: Secondary | ICD-10-CM | POA: Diagnosis not present

## 2021-04-09 DIAGNOSIS — K219 Gastro-esophageal reflux disease without esophagitis: Secondary | ICD-10-CM

## 2021-04-09 NOTE — Progress Notes (Signed)
Jade Hernandez 80 y.o. 1941/02/08 [id number]  Assessment & Plan:   Crohn's disease of both small and large intestine with other complication ([place]) Asymptomatic at this time.  I will see her back in 6 months.  Consider calprotectin testing at that time.  Continue Entyvio.  She will taper prednisone 5 mg every other day x7 doses and stop.   Long term (current) use of systemic steroids Consider DEXA scan when she follows up  Iron deficiency anemia due to chronic blood loss Hemoglobin normal at Dr. Raul Del office she had an iron level that was normal and her supplementation was stopped.  We can follow-up on this at her next visit as well.  Her ferritin was climbing when I checked it last that was only low normal.  GERD (gastroesophageal reflux disease) Famotidine 20 g with supper   Skin lesion looks benign and I do not think it is related to Crohn's disease.  Its been there for years perhaps.  She was complaining about it but I do not think it bothers her too much.  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  I suspect she just needs to follow-up with dermatology again.   I appreciate the opportunity to care for this patient. CC: [name], MD   Subjective:   Chief Complaint: Follow-up of Crohn's disease  HPI The patient is here with her daughter Jade Hernandez and reports she is doing well without active Crohn's symptoms.  She saw Dr. Brigitte Pulse who has suggested she could eat more real protein and stop the protein shakes.  Her blood pressure has been up at times mainly when she goes for infusions.  She is following up with Dr. Claiborne Billings in the near future.  She is having some heartburn most evenings.  Tums will relieve it.  Husband is still under treatment for pancreatic cancer and is also my patient.  She has an itchy area on the back of her neck and a lesion there she went to a dermatologist and was prescribed a cream that was not helpful but she has not returned and she is asking  me about this.  Wt Readings from Last 3 Encounters:  04/09/21 169 lb 6.4 oz (76.8 kg)  02/19/21 167 lb 6.4 oz (75.9 kg)  01/06/21 162 lb (73.5 kg)    Allergies  Allergen Reactions  . Feraheme [Ferumoxytol] Shortness Of Breath and Palpitations    Severe pain all over   Current Meds  Medication Sig  . COVID-19 mRNA vaccine, Moderna, 100 MCG/0.5ML injection Inject into the muscle.  . diphenoxylate-atropine (LOMOTIL) 2.5-0.025 MG tablet 1-2 tablets 4 times a day (Patient taking differently: 1 tablet 4 (four) times daily as needed. 1-2 tablets 4 times a day)  . metoprolol succinate (TOPROL-XL) 50 MG 24 hr tablet TAKE 1 AND 1/2 TABLETS(75 MG) BY MOUTH DAILY (Patient taking differently: 50 mg. TAKE 2 BY MOUTH ONCE DAILY)  . predniSONE (DELTASONE) 5 MG tablet Take 1 tablet (5 mg total) by mouth daily with breakfast.  . Probiotic Product (PROBIOTIC DAILY PO) Take 1 capsule by mouth daily.  . simvastatin (ZOCOR) 40 MG tablet Take 40 mg by mouth daily.  . Vitamin D, Ergocalciferol, 50 MCG (2000 UT) CAPS Take 1 capsule by mouth daily.   Past Medical History:  Diagnosis Date  . Adenomatous colon polyp   . Arthritis   . Crohn's disease of both small and large intestine with other complication ([place]) 54/04/2702  . Diverticulosis   .  High cholesterol   . Hypertension   . IBS (irritable bowel syndrome)   . Palpitations   . UTI (urinary tract infection)    Past Surgical History:  Procedure Laterality Date  . BILATERAL CARPAL TUNNEL RELEASE    . COLONOSCOPY    . NM MYOCAR PERF WALL MOTION  10/02/2007   No significant ischemia demonstrated  . 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 US ECHOCARDIOGRAPHY  10/29/2010   Proximal septal thickening noted, EF =>55%,LA mildly dilated,mild mitral annular ca+, AOV mildly sclerotic, mild AI.    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   Review of Systems   Objective:   Physical Exam BP 130/80   Pulse 70   Ht 5\' 3"  (1.6 m)   Wt 169 lb 6.4 oz (76.8 kg)   SpO2 98%   BMI 30.01 kg/m   Lungs are clear Heart sounds are normal Abdomen is soft and nontender Posterior lower neck upper back skin lesion in photo below

## 2021-04-09 NOTE — Patient Instructions (Signed)
Great to see you today.   Please call us in September for a November appointment.  Take your prednisone as directed (see medicine list).   I appreciate the opportunity to care for you. Silvano Rusk, MD, [facility]

## 2021-04-10 NOTE — Assessment & Plan Note (Signed)
Asymptomatic at this time.  I will see her back in 6 months.  Consider calprotectin testing at that time.  Continue Entyvio.  She will taper prednisone 5 mg every other day x7 doses and stop.

## 2021-04-10 NOTE — Assessment & Plan Note (Signed)
Consider DEXA scan when she follows up

## 2021-04-10 NOTE — Assessment & Plan Note (Signed)
Hemoglobin normal at Dr. Raul Del office she had an iron level that was normal and her supplementation was stopped.  We can follow-up on this at her next visit as well.  Her ferritin was climbing when I checked it last that was only low normal.

## 2021-04-10 NOTE — Assessment & Plan Note (Signed)
Famotidine 20 g with supper

## 2021-04-20 DIAGNOSIS — H25813 Combined forms of age-related cataract, bilateral: Secondary | ICD-10-CM | POA: Diagnosis not present

## 2021-04-20 DIAGNOSIS — H5213 Myopia, bilateral: Secondary | ICD-10-CM | POA: Diagnosis not present

## 2021-05-04 ENCOUNTER — Other Ambulatory Visit: Payer: Self-pay

## 2021-05-04 ENCOUNTER — Encounter: Payer: Self-pay | Admitting: Physician Assistant

## 2021-05-04 ENCOUNTER — Ambulatory Visit (INDEPENDENT_AMBULATORY_CARE_PROVIDER_SITE_OTHER): Payer: Medicare Other | Admitting: Physician Assistant

## 2021-05-04 VITALS — BP 102/78 | HR 72 | Ht 63.0 in | Wt 173.0 lb

## 2021-05-04 DIAGNOSIS — I10 Essential (primary) hypertension: Secondary | ICD-10-CM

## 2021-05-04 DIAGNOSIS — Z8249 Family history of ischemic heart disease and other diseases of the circulatory system: Secondary | ICD-10-CM

## 2021-05-04 DIAGNOSIS — E785 Hyperlipidemia, unspecified: Secondary | ICD-10-CM | POA: Diagnosis not present

## 2021-05-04 NOTE — Progress Notes (Signed)
Cardiology Office Note:    Date:  05/06/2021   ID:  [name], DOB 06/02/1941, MRN [id number]  PCP:  [name], MD   [place] Providers Cardiologist:  Shelva Majestic, MD     Referring MD: [name], MD   Chief Complaint  Patient presents with   Follow-up    12 months.    History of Present Illness:    Jade Hernandez is a 80 y.o. female with a hx of hypertension, hyperlipidemia, and Crohn's disease.  She has strong family history for premature CAD.  Echocardiogram obtained in December 20 11 showed proximal septal thickening without outflow tract obstruction, normal LV function, grade 1 DD, mild MR and mild AI.  Myoview in 2008 revealed normal perfusion.  Echocardiogram obtained on 08/15/2020 showed EF 55 to 65%, mild AI, mild MR  Patient presents today for follow-up.  Overall she has been doing well.  Her husband had cancer last year and she was very stressed.  She also had Crohn's flares as well and resultant anemia.  She has taken some iron infusion, most recent hemoglobin was 13.9.  Iron infusion has stopped.  Recent lab work obtained by PCP on 03/25/2021 showed stable renal function and electrolyte.  Normal liver function test.  Total cholesterol 199, HDL 58, LDL 113, triglyceride 163.  TSH normal.  Per patient, she self discontinued simvastatin for several month, however restarted simvastatin about 2 weeks ago after her lab work showed elevated LDL number.  I will defer to her PCP on when to repeat lipid panel in this case.  Otherwise she denies any chest pain, lower extremity edema, orthopnea or PND.  She is doing very well from a cardiac perspective and can follow-up with Dr. Claiborne Billings in 1 year.   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    Current Medications: Current Meds  Medication Sig   COVID-19 mRNA vaccine, Moderna, 100 MCG/0.5ML injection Inject into the muscle.   metoprolol succinate (TOPROL-XL) 50 MG 24 hr tablet TAKE 1 AND 1/2 TABLETS(75 MG) BY MOUTH DAILY (Patient taking differently: Take 75 mg by mouth daily.)   Probiotic Product (PROBIOTIC DAILY PO) Take 1 capsule by mouth daily.   simvastatin (ZOCOR) 40 MG tablet Take 40 mg by mouth daily.   Vitamin D, Ergocalciferol, 50 MCG (2000 UT) CAPS Take 1 capsule by mouth daily.   [DISCONTINUED] diphenoxylate-atropine (LOMOTIL) 2.5-0.025 MG tablet 1-2 tablets 4 times a day (Patient taking differently: 1 tablet 4 (four) times daily as needed. 1-2 tablets 4 times a day)   [DISCONTINUED] predniSONE (DELTASONE) 5 MG tablet Take 1 tablet (5  mg total) by mouth every other day.     Allergies:   Feraheme [ferumoxytol]   Social History   Socioeconomic History   Marital status: Married    Spouse name: Not on file   Number of children: 4   Years of education: Not on file   Highest education level: Not on file  Occupational History   Occupation: retired  Tobacco Use   Smoking status: Never   Smokeless tobacco: Never  Vaping Use   Vaping Use: Never used  Substance and Sexual Activity   Alcohol use: Yes    Alcohol/week: 1.0 standard drink    Types: 1 Glasses of wine per week     Comment: occasional   Drug use: No   Sexual activity: Not on file  Other Topics Concern   Not on file  Social History Narrative   The patient is married she is originally from [place]   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 There is no history of Colon cancer, Rectal cancer, Liver cancer, or Stomach cancer.  ROS:   Please see the history of present illness.     All other systems reviewed and are negative.  EKGs/Labs/Other Studies Reviewed:    The following studies were reviewed today:  Echo 08/15/2020   EKG:  EKG is ordered today.  The ekg ordered today demonstrates normal sinus rhythm, no significant ST-T wave changes.  Recent Labs: 07/08/2020: TSH 2.86 11/03/2020: ALT 11; BUN 20; Creatinine, Ser 0.44; Potassium 4.1; Sodium 137 01/06/2021: Hemoglobin 13.0; Platelets 226.0  Recent Lipid Panel No results found for: CHOL, TRIG, HDL, CHOLHDL, VLDL, LDLCALC, LDLDIRECT   Risk Assessment/Calculations:           Physical Exam:    VS:  BP 102/78 (BP Location: Left Arm, Patient Position: Sitting, Cuff Size: Normal)   Pulse 72   Ht 5\' 3"  (1.6 m)   Wt 173 lb (78.5 kg)   BMI 30.65 kg/m     Wt Readings from Last 3 Encounters:  05/04/21 173 lb (78.5 kg)  04/09/21 169 lb 6.4 oz (76.8 kg)  02/19/21 167 lb 6.4 oz (75.9 kg)     GEN:  Well nourished, well developed in no acute distress HEENT: Normal NECK: No JVD; No carotid bruits LYMPHATICS: No lymphadenopathy CARDIAC: RRR, no murmurs, rubs,  gallops RESPIRATORY:  Clear to auscultation without rales, wheezing or rhonchi  ABDOMEN: Soft, non-tender, non-distended MUSCULOSKELETAL:  No edema; No deformity  SKIN: Warm and dry NEUROLOGIC:  Alert and oriented x 3 PSYCHIATRIC:  Normal affect   ASSESSMENT:    1. Family history of early CAD   2. Essential hypertension   3. Hyperlipidemia LDL goal <100    PLAN:    In order of problems listed above:  Family history of early CAD: No significant chest discomfort recently.  Continue metoprolol succinate and simvastatin.  Hypertension: Blood pressure very well controlled on the current therapy  Hyperlipidemia: Last lipid panel showed uncontrolled cholesterol, however patient was off  of simvastatin for several months and was just recently restarted on the simvastatin.  Recommend repeat fasting lipid panel and LFT by PCP.         Medication Adjustments/Labs and Tests Ordered: Current medicines are reviewed at length with the patient today.  Concerns regarding medicines are outlined above.  Orders Placed This Encounter  Procedures   EKG 12-Lead   No orders of the defined types were placed in this encounter.   Patient Instructions  Medication Instructions:  Your physician recommends that you continue on your current medications as directed. Please refer to the Current Medication list given to you today.  *If you need a refill on your cardiac medications before your next appointment, please call your pharmacy*  Lab Work: NONE ordered at this time of appointment   If you have labs (blood work) drawn today and your tests are completely normal, you will receive your results only by: [place] (if you have MyChart) OR A paper copy in the mail If you have any lab test that is abnormal or we need to change your treatment, we will call you to review the results.  Testing/Procedures: NONE ordered at this time of appointment   Follow-Up: At [facility], you and your  health needs are our priority.  As part of our continuing mission to provide you with exceptional heart care, we have created designated Provider Care Teams.  These Care Teams include your primary Cardiologist (physician) and Advanced Practice Providers (APPs -  Physician Assistants and Nurse Practitioners) who all work together to provide you with the care you need, when you need it.  Your next appointment:   12 month(s)  The format for your next appointment:   In Person  Provider:   Shelva Majestic, MD   Other Instructions    Signed, Almyra Deforest, [place]  05/06/2021 5:04 PM    Agenda

## 2021-05-04 NOTE — Patient Instructions (Signed)
Medication Instructions:  Your physician recommends that you continue on your current medications as directed. Please refer to the Current Medication list given to you today.  *If you need a refill on your cardiac medications before your next appointment, please call your pharmacy*  Lab Work: NONE ordered at this time of appointment   If you have labs (blood work) drawn today and your tests are completely normal, you will receive your results only by: [place] (if you have MyChart) OR A paper copy in the mail If you have any lab test that is abnormal or we need to change your treatment, we will call you to review the results.  Testing/Procedures: NONE ordered at this time of appointment   Follow-Up: At [facility], you and your health needs are our priority.  As part of our continuing mission to provide you with exceptional heart care, we have created designated Provider Care Teams.  These Care Teams include your primary Cardiologist (physician) and Advanced Practice Providers (APPs -  Physician Assistants and Nurse Practitioners) who all work together to provide you with the care you need, when you need it.  Your next appointment:   12 month(s)  The format for your next appointment:   In Person  Provider:   Shelva Majestic, MD   Other Instructions

## 2021-05-07 DIAGNOSIS — H25011 Cortical age-related cataract, right eye: Secondary | ICD-10-CM | POA: Diagnosis not present

## 2021-05-07 DIAGNOSIS — H25811 Combined forms of age-related cataract, right eye: Secondary | ICD-10-CM | POA: Diagnosis not present

## 2021-05-07 DIAGNOSIS — H5709 Other anomalies of pupillary function: Secondary | ICD-10-CM | POA: Diagnosis not present

## 2021-05-07 DIAGNOSIS — H2589 Other age-related cataract: Secondary | ICD-10-CM | POA: Diagnosis not present

## 2021-05-28 ENCOUNTER — Ambulatory Visit (INDEPENDENT_AMBULATORY_CARE_PROVIDER_SITE_OTHER): Payer: Medicare Other

## 2021-05-28 ENCOUNTER — Other Ambulatory Visit: Payer: Self-pay

## 2021-05-28 VITALS — BP 157/77 | HR 72 | Temp 98.4°F | Resp 16

## 2021-05-28 DIAGNOSIS — K50818 Crohn's disease of both small and large intestine with other complication: Secondary | ICD-10-CM

## 2021-05-28 MED ORDER — SODIUM CHLORIDE 0.9% FLUSH: 3.0000 mL | Freq: Once | INTRAVENOUS | Status: DC | PRN

## 2021-05-28 MED ORDER — METHYLPREDNISOLONE SODIUM SUCC 125 MG IJ SOLR: 125.0000 mg | Freq: Once | INTRAMUSCULAR | Status: DC | PRN

## 2021-05-28 MED ORDER — ALTEPLASE 2 MG IJ SOLR: 2.0000 mg | Freq: Once | INTRAMUSCULAR | Status: DC | PRN

## 2021-05-28 MED ORDER — VEDOLIZUMAB 300 MG IV SOLR
300.0000 mg | Freq: Once | INTRAVENOUS | Status: AC
  Administered 2021-05-28: 300 mg via INTRAVENOUS
  Filled 2021-05-28: qty 5

## 2021-05-28 MED ORDER — ALBUTEROL SULFATE HFA 108 (90 BASE) MCG/ACT IN AERS: 2.0000 | INHALATION_SPRAY | Freq: Once | RESPIRATORY_TRACT | Status: DC | PRN

## 2021-05-28 MED ORDER — HEPARIN SOD (PORK) LOCK FLUSH 100 UNIT/ML IV SOLN: 500.0000 [IU] | Freq: Once | INTRAVENOUS | Status: DC | PRN

## 2021-05-28 MED ORDER — SODIUM CHLORIDE 0.9% FLUSH: 10.0000 mL | Freq: Once | INTRAVENOUS | Status: DC | PRN

## 2021-05-28 MED ORDER — DIPHENHYDRAMINE HCL 50 MG/ML IJ SOLN: 50.0000 mg | Freq: Once | INTRAMUSCULAR | Status: DC | PRN

## 2021-05-28 MED ORDER — EPINEPHRINE 0.3 MG/0.3ML IJ SOAJ: 0.3000 mg | Freq: Once | INTRAMUSCULAR | Status: DC | PRN

## 2021-05-28 MED ORDER — FAMOTIDINE IN NACL 20-0.9 MG/50ML-% IV SOLN: 20.0000 mg | Freq: Once | INTRAVENOUS | Status: DC | PRN

## 2021-05-28 MED ORDER — SODIUM CHLORIDE 0.9 % IV SOLN: Freq: Once | INTRAVENOUS | Status: DC | PRN

## 2021-05-28 MED ORDER — ANTICOAGULANT SODIUM CITRATE 4% (200MG/5ML) IV SOLN
5.0000 mL | Freq: Once | Status: DC | PRN
  Filled 2021-05-28: qty 5

## 2021-05-28 MED ORDER — HEPARIN SOD (PORK) LOCK FLUSH 100 UNIT/ML IV SOLN: 250.0000 [IU] | Freq: Once | INTRAVENOUS | Status: DC | PRN

## 2021-05-28 NOTE — Progress Notes (Signed)
Diagnosis: Crohn's Disease  Provider:  Marshell Garfinkel, MD  Procedure: Infusion  IV Type: Peripheral, IV Location: L Antecubital  Entyvio (Vedolizumab), Dose: 300 mg  Infusion Start Time: 1450  Infusion Stop Time: 0511  Post Infusion IV Care: Peripheral IV Discontinued  Discharge: Condition: Good, Destination: Home . AVS provided to patient.   Performed by:  Koren Shiver, RN

## 2021-06-14 ENCOUNTER — Other Ambulatory Visit: Payer: Self-pay | Admitting: Cardiovascular Disease

## 2021-06-15 DIAGNOSIS — R7303 Prediabetes: Secondary | ICD-10-CM | POA: Diagnosis not present

## 2021-06-15 DIAGNOSIS — I10 Essential (primary) hypertension: Secondary | ICD-10-CM | POA: Diagnosis not present

## 2021-06-15 DIAGNOSIS — M1990 Unspecified osteoarthritis, unspecified site: Secondary | ICD-10-CM | POA: Diagnosis not present

## 2021-06-15 DIAGNOSIS — K50019 Crohn's disease of small intestine with unspecified complications: Secondary | ICD-10-CM | POA: Diagnosis not present

## 2021-06-15 DIAGNOSIS — D509 Iron deficiency anemia, unspecified: Secondary | ICD-10-CM | POA: Diagnosis not present

## 2021-06-17 DIAGNOSIS — M25561 Pain in right knee: Secondary | ICD-10-CM | POA: Diagnosis not present

## 2021-07-03 DIAGNOSIS — M76822 Posterior tibial tendinitis, left leg: Secondary | ICD-10-CM | POA: Diagnosis not present

## 2021-07-03 DIAGNOSIS — M19072 Primary osteoarthritis, left ankle and foot: Secondary | ICD-10-CM | POA: Diagnosis not present

## 2021-07-03 DIAGNOSIS — M19071 Primary osteoarthritis, right ankle and foot: Secondary | ICD-10-CM | POA: Diagnosis not present

## 2021-07-03 DIAGNOSIS — M76821 Posterior tibial tendinitis, right leg: Secondary | ICD-10-CM | POA: Diagnosis not present

## 2021-07-23 ENCOUNTER — Ambulatory Visit (INDEPENDENT_AMBULATORY_CARE_PROVIDER_SITE_OTHER): Payer: Medicare Other

## 2021-07-23 ENCOUNTER — Other Ambulatory Visit: Payer: Self-pay

## 2021-07-23 VITALS — BP 125/65 | HR 69 | Temp 97.6°F | Resp 16

## 2021-07-23 DIAGNOSIS — K50818 Crohn's disease of both small and large intestine with other complication: Secondary | ICD-10-CM | POA: Diagnosis not present

## 2021-07-23 MED ORDER — VEDOLIZUMAB 300 MG IV SOLR
300.0000 mg | Freq: Once | INTRAVENOUS | Status: AC
  Administered 2021-07-23: 300 mg via INTRAVENOUS
  Filled 2021-07-23: qty 5

## 2021-07-23 NOTE — Progress Notes (Signed)
  Diagnosis: Crohn's Disease  Provider:  Marshell Garfinkel, MD  Procedure: Injection  IV Type: Peripheral, IV Location: L Antecubital  Entyvio (Vedolizumab), Dose: 300 mg  Infusion Start Time: [id number]  Infusion Stop Time: [id number]  Post Infusion IV Care: Observation period completed and Peripheral IV Discontinued  Discharge: Condition: Good, Destination: Home . AVS provided to patient.   Performed by:  Charlie Pitter, RN

## 2021-07-30 DIAGNOSIS — M25561 Pain in right knee: Secondary | ICD-10-CM | POA: Diagnosis not present

## 2021-08-13 DIAGNOSIS — M1711 Unilateral primary osteoarthritis, right knee: Secondary | ICD-10-CM | POA: Diagnosis not present

## 2021-08-14 DIAGNOSIS — Z1152 Encounter for screening for COVID-19: Secondary | ICD-10-CM | POA: Diagnosis not present

## 2021-08-24 DIAGNOSIS — Z23 Encounter for immunization: Secondary | ICD-10-CM | POA: Diagnosis not present

## 2021-09-17 ENCOUNTER — Ambulatory Visit (INDEPENDENT_AMBULATORY_CARE_PROVIDER_SITE_OTHER): Payer: Medicare Other

## 2021-09-17 ENCOUNTER — Other Ambulatory Visit: Payer: Self-pay

## 2021-09-17 VITALS — BP 144/74 | HR 62 | Temp 97.7°F | Resp 16 | Ht 63.0 in | Wt 173.8 lb

## 2021-09-17 DIAGNOSIS — K50818 Crohn's disease of both small and large intestine with other complication: Secondary | ICD-10-CM

## 2021-09-17 MED ORDER — FAMOTIDINE IN NACL 20-0.9 MG/50ML-% IV SOLN: 20.0000 mg | Freq: Once | INTRAVENOUS | Status: DC | PRN

## 2021-09-17 MED ORDER — EPINEPHRINE 0.3 MG/0.3ML IJ SOAJ: 0.3000 mg | Freq: Once | INTRAMUSCULAR | Status: DC | PRN

## 2021-09-17 MED ORDER — ALBUTEROL SULFATE HFA 108 (90 BASE) MCG/ACT IN AERS: 2.0000 | INHALATION_SPRAY | Freq: Once | RESPIRATORY_TRACT | Status: DC | PRN

## 2021-09-17 MED ORDER — VEDOLIZUMAB 300 MG IV SOLR
300.0000 mg | Freq: Once | INTRAVENOUS | Status: AC
  Administered 2021-09-17: 300 mg via INTRAVENOUS
  Filled 2021-09-17: qty 5

## 2021-09-17 MED ORDER — SODIUM CHLORIDE 0.9 % IV SOLN: Freq: Once | INTRAVENOUS | Status: DC | PRN

## 2021-09-17 MED ORDER — METHYLPREDNISOLONE SODIUM SUCC 125 MG IJ SOLR: 125.0000 mg | Freq: Once | INTRAMUSCULAR | Status: DC | PRN

## 2021-09-17 MED ORDER — DIPHENHYDRAMINE HCL 50 MG/ML IJ SOLN: 50.0000 mg | Freq: Once | INTRAMUSCULAR | Status: DC | PRN

## 2021-09-17 NOTE — Progress Notes (Signed)
Diagnosis: Crohn's Disease  Provider:  Marshell Garfinkel, MD  Procedure: Infusion  IV Type: Peripheral, IV Location: L Forearm  Entyvio (Vedolizumab), Dose: 300 mg  Infusion Start Time: 0383  Infusion Stop Time: 3383  Post Infusion IV Care: Peripheral IV Discontinued  Discharge: Condition: Good, Destination: Home . AVS provided to patient.   Performed by:  Charlie Pitter, RN

## 2021-10-12 ENCOUNTER — Other Ambulatory Visit (HOSPITAL_BASED_OUTPATIENT_CLINIC_OR_DEPARTMENT_OTHER): Payer: Self-pay

## 2021-10-12 ENCOUNTER — Ambulatory Visit: Payer: Medicare Other | Attending: Internal Medicine

## 2021-10-12 ENCOUNTER — Other Ambulatory Visit: Payer: Self-pay

## 2021-10-12 ENCOUNTER — Encounter: Payer: Self-pay | Admitting: Internal Medicine

## 2021-10-12 DIAGNOSIS — Z23 Encounter for immunization: Secondary | ICD-10-CM

## 2021-10-12 MED ORDER — PFIZER COVID-19 VAC BIVALENT 30 MCG/0.3ML IM SUSP
INTRAMUSCULAR | 0 refills | Status: DC
  Filled 2021-10-12: qty 0.3, 1d supply, fill #0

## 2021-10-12 NOTE — Progress Notes (Signed)
   Covid-19 Vaccination Clinic  Name:  [name]    MRN: [id number] DOB: Nov 26, 1940  10/12/2021  Jade Hernandez was observed post Covid-19 immunization for 15 minutes without incident. She was provided with Vaccine Information Sheet and instruction to access the V-Safe system.   Jade Hernandez was instructed to call 911 with any severe reactions post vaccine: Difficulty breathing  Swelling of face and throat  A fast heartbeat  A bad rash all over body  Dizziness and weakness   Immunizations Administered     Name Date Dose VIS Date Route   Pfizer Covid-19 Vaccine Bivalent Booster 10/12/2021  2:20 PM 0.3 mL 07/15/2021 Intramuscular   Manufacturer: [place]   Lot: [id number]   [place]: [phone]

## 2021-10-27 ENCOUNTER — Ambulatory Visit (INDEPENDENT_AMBULATORY_CARE_PROVIDER_SITE_OTHER): Payer: Medicare Other | Admitting: Internal Medicine

## 2021-10-27 ENCOUNTER — Other Ambulatory Visit: Payer: Medicare Other

## 2021-10-27 ENCOUNTER — Encounter: Payer: Self-pay | Admitting: Internal Medicine

## 2021-10-27 VITALS — BP 142/72 | HR 73 | Ht 63.0 in | Wt 172.0 lb

## 2021-10-27 DIAGNOSIS — Z7960 Long term (current) use of unspecified immunomodulators and immunosuppressants: Secondary | ICD-10-CM

## 2021-10-27 DIAGNOSIS — K5080 Crohn's disease of both small and large intestine without complications: Secondary | ICD-10-CM

## 2021-10-27 NOTE — Patient Instructions (Signed)

## 2021-10-27 NOTE — Progress Notes (Signed)
ALIANYS CHACKO 80 y.o. Jun 11, 1941 [id number]  Assessment & Plan:   Encounter Diagnoses  Name Primary?   Crohn's disease of both small and large intestine without complication ([place]) Yes   Long-term use of immunosuppressant medication    She is doing well clinically.  We talked about how to reassess.   Colonoscopy versus calprotectin and we have decided to do calprotectin.  If calprotectin is significantly elevated may need to consider colonoscopy.  Recheck QuantiFERON.  Continue Entyvio every 8 weeks.  Orders Placed This Encounter  Procedures   Calprotectin, Fecal   QuantiFERON-TB Gold Plus    CC: [name], MD  Subjective:   Chief Complaint: Follow-up of Crohn's ileocolitis.  HPI The patient is here with her daughter Tito Dine and says she is doing well with respect to her GI tract.  She has a history of Crohn's disease of the ileum and colon diagnosed a colonoscopy in March 2020.  She had acute changes on biopsies of the colon and ileitis without obvious chronic changes on the terminal ileal biopsies.  Treatment with budesonide was somewhat helpful we did pulsed therapy and then she had persistent problems and she has ended up on Entyvio since earlier this year.  It was started in April.  That is going well without side effects.  She has gained weight appetite is better.  She has had labs through Dr. Brigitte Pulse primary care this year and in August a CBC was normal her iron studies were normal though low normal, and LFTs and electrolytes and kidney function were normal.  B12 was normal.  She had a wheatbased holiday dish and had some diarrhea recently but overall doing well.  Wt Readings from Last 3 Encounters:  10/27/21 172 lb (78 kg)  09/17/21 173 lb 12.8 oz (78.8 kg)  05/04/21 173 lb (78.5 kg)  Review of systems positive for right shoulder and right knee pain.  She is using Tylenol up to 4 tablets a day. Her husband who is also a patient of mine is overall stable with his  metastatic pancreatic cancer in observation mode.  It remains a stressor for her.  Allergies  Allergen Reactions   Feraheme [Ferumoxytol] Shortness Of Breath and Palpitations    Severe pain all over   Current Meds  Medication Sig   COVID-19 mRNA bivalent vaccine, Pfizer, (PFIZER COVID-19 VAC BIVALENT) injection Inject into the muscle.   COVID-19 mRNA vaccine, Moderna, 100 MCG/0.5ML injection Inject into the muscle.   metoprolol succinate (TOPROL-XL) 50 MG 24 hr tablet TAKE 1 AND 1/2 TABLETS(75 MG) BY MOUTH DAILY   Probiotic Product (PROBIOTIC DAILY PO) Take 1 capsule by mouth daily.   simvastatin (ZOCOR) 40 MG tablet Take 40 mg by mouth daily.   Vitamin D, Ergocalciferol, 50 MCG (2000 UT) CAPS Take 1 capsule by mouth daily.   Past Medical History:  Diagnosis Date   Adenomatous colon polyp    Arthritis    Crohn's disease of both small and large intestine with other complication (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   Review of Systems As per HPI  Objective:   Physical Exam BP (!) 142/72    Pulse 73    Ht 5\' 3"  (1.6 m)    Wt 172 lb (78 kg)    BMI 30.47 kg/m  NAD Lungs cta Cor NL S1S2 no rmg Abd soft and NT BS +

## 2021-10-29 ENCOUNTER — Other Ambulatory Visit: Payer: Medicare Other

## 2021-10-29 DIAGNOSIS — Z7960 Long term (current) use of unspecified immunomodulators and immunosuppressants: Secondary | ICD-10-CM | POA: Diagnosis not present

## 2021-10-29 DIAGNOSIS — K5080 Crohn's disease of both small and large intestine without complications: Secondary | ICD-10-CM | POA: Diagnosis not present

## 2021-10-30 LAB — QUANTIFERON-TB GOLD PLUS
Mitogen-NIL: >10 IU/mL
NIL: 0.03 IU/mL
QuantiFERON-TB Gold Plus: NEGATIVE
TB1-NIL: 0 IU/mL
TB2-NIL: <0 IU/mL

## 2021-11-02 LAB — CALPROTECTIN, FECAL: Calprotectin, Fecal: 86 ug/g (ref 0–120)

## 2021-11-12 ENCOUNTER — Ambulatory Visit (INDEPENDENT_AMBULATORY_CARE_PROVIDER_SITE_OTHER): Payer: Medicare Other

## 2021-11-12 ENCOUNTER — Other Ambulatory Visit: Payer: Self-pay

## 2021-11-12 VITALS — BP 152/76 | HR 70 | Temp 97.7°F | Resp 16 | Ht 63.0 in | Wt 173.2 lb

## 2021-11-12 DIAGNOSIS — K50818 Crohn's disease of both small and large intestine with other complication: Secondary | ICD-10-CM

## 2021-11-12 MED ORDER — DIPHENHYDRAMINE HCL 50 MG/ML IJ SOLN: 50.0000 mg | Freq: Once | INTRAMUSCULAR | Status: DC | PRN

## 2021-11-12 MED ORDER — SODIUM CHLORIDE 0.9 % IV SOLN: Freq: Once | INTRAVENOUS | Status: DC | PRN

## 2021-11-12 MED ORDER — FAMOTIDINE IN NACL 20-0.9 MG/50ML-% IV SOLN: 20.0000 mg | Freq: Once | INTRAVENOUS | Status: DC | PRN

## 2021-11-12 MED ORDER — EPINEPHRINE 0.3 MG/0.3ML IJ SOAJ: 0.3000 mg | Freq: Once | INTRAMUSCULAR | Status: DC | PRN

## 2021-11-12 MED ORDER — VEDOLIZUMAB 300 MG IV SOLR
300.0000 mg | Freq: Once | INTRAVENOUS | Status: AC
  Administered 2021-11-12: 14:00:00 300 mg via INTRAVENOUS
  Filled 2021-11-12: qty 5

## 2021-11-12 MED ORDER — METHYLPREDNISOLONE SODIUM SUCC 125 MG IJ SOLR: 125.0000 mg | Freq: Once | INTRAMUSCULAR | Status: DC | PRN

## 2021-11-12 MED ORDER — ALBUTEROL SULFATE HFA 108 (90 BASE) MCG/ACT IN AERS: 2.0000 | INHALATION_SPRAY | Freq: Once | RESPIRATORY_TRACT | Status: DC | PRN

## 2021-11-12 NOTE — Progress Notes (Signed)
Diagnosis: Crohn's Disease  Provider:  Marshell Garfinkel, MD  Procedure: Infusion  IV Type: Peripheral, IV Location: L Forearm  Entyvio (Vedolizumab), Dose: 300 mg  Infusion Start Time: 5974  Infusion Stop Time: 7185  Post Infusion IV Care: Peripheral IV Discontinued  Discharge: Condition: Good, Destination: Home . AVS provided to patient.   Performed by:  Paul Dykes, RN

## 2021-11-17 ENCOUNTER — Other Ambulatory Visit: Payer: Self-pay | Admitting: Pharmacy Technician

## 2021-11-20 ENCOUNTER — Other Ambulatory Visit: Payer: Self-pay | Admitting: Internal Medicine

## 2021-11-20 DIAGNOSIS — M25511 Pain in right shoulder: Secondary | ICD-10-CM | POA: Diagnosis not present

## 2021-11-23 DIAGNOSIS — M1711 Unilateral primary osteoarthritis, right knee: Secondary | ICD-10-CM | POA: Diagnosis not present

## 2021-11-23 DIAGNOSIS — M170 Bilateral primary osteoarthritis of knee: Secondary | ICD-10-CM | POA: Diagnosis not present

## 2021-12-01 DIAGNOSIS — Z1231 Encounter for screening mammogram for malignant neoplasm of breast: Secondary | ICD-10-CM | POA: Diagnosis not present

## 2021-12-08 DIAGNOSIS — H52202 Unspecified astigmatism, left eye: Secondary | ICD-10-CM | POA: Diagnosis not present

## 2021-12-08 DIAGNOSIS — H2512 Age-related nuclear cataract, left eye: Secondary | ICD-10-CM | POA: Diagnosis not present

## 2021-12-13 ENCOUNTER — Encounter: Payer: Self-pay | Admitting: Internal Medicine

## 2021-12-21 DIAGNOSIS — M1990 Unspecified osteoarthritis, unspecified site: Secondary | ICD-10-CM | POA: Diagnosis not present

## 2021-12-21 DIAGNOSIS — Z0000 Encounter for general adult medical examination without abnormal findings: Secondary | ICD-10-CM | POA: Diagnosis not present

## 2021-12-21 DIAGNOSIS — K50019 Crohn's disease of small intestine with unspecified complications: Secondary | ICD-10-CM | POA: Diagnosis not present

## 2021-12-21 DIAGNOSIS — R7303 Prediabetes: Secondary | ICD-10-CM | POA: Diagnosis not present

## 2021-12-21 DIAGNOSIS — M899 Disorder of bone, unspecified: Secondary | ICD-10-CM | POA: Diagnosis not present

## 2021-12-21 DIAGNOSIS — I10 Essential (primary) hypertension: Secondary | ICD-10-CM | POA: Diagnosis not present

## 2021-12-21 DIAGNOSIS — E782 Mixed hyperlipidemia: Secondary | ICD-10-CM | POA: Diagnosis not present

## 2021-12-21 DIAGNOSIS — N3281 Overactive bladder: Secondary | ICD-10-CM | POA: Diagnosis not present

## 2022-01-05 DIAGNOSIS — M25511 Pain in right shoulder: Secondary | ICD-10-CM | POA: Diagnosis not present

## 2022-01-07 ENCOUNTER — Other Ambulatory Visit: Payer: Self-pay

## 2022-01-07 ENCOUNTER — Ambulatory Visit (INDEPENDENT_AMBULATORY_CARE_PROVIDER_SITE_OTHER): Payer: Medicare Other

## 2022-01-07 VITALS — BP 132/75 | HR 61 | Temp 98.9°F | Resp 16 | Ht 63.0 in | Wt 174.2 lb

## 2022-01-07 DIAGNOSIS — K50818 Crohn's disease of both small and large intestine with other complication: Secondary | ICD-10-CM | POA: Diagnosis not present

## 2022-01-07 MED ORDER — ALBUTEROL SULFATE HFA 108 (90 BASE) MCG/ACT IN AERS: 2.0000 | INHALATION_SPRAY | Freq: Once | RESPIRATORY_TRACT | Status: DC | PRN

## 2022-01-07 MED ORDER — VEDOLIZUMAB 300 MG IV SOLR
300.0000 mg | Freq: Once | INTRAVENOUS | Status: AC
  Administered 2022-01-07: 300 mg via INTRAVENOUS
  Filled 2022-01-07: qty 5

## 2022-01-07 MED ORDER — DIPHENHYDRAMINE HCL 50 MG/ML IJ SOLN: 50.0000 mg | Freq: Once | INTRAMUSCULAR | Status: DC | PRN

## 2022-01-07 MED ORDER — FAMOTIDINE IN NACL 20-0.9 MG/50ML-% IV SOLN: 20.0000 mg | Freq: Once | INTRAVENOUS | Status: DC | PRN

## 2022-01-07 MED ORDER — EPINEPHRINE 0.3 MG/0.3ML IJ SOAJ: 0.3000 mg | Freq: Once | INTRAMUSCULAR | Status: DC | PRN

## 2022-01-07 MED ORDER — SODIUM CHLORIDE 0.9 % IV SOLN: Freq: Once | INTRAVENOUS | Status: DC | PRN

## 2022-01-07 MED ORDER — METHYLPREDNISOLONE SODIUM SUCC 125 MG IJ SOLR: 125.0000 mg | Freq: Once | INTRAMUSCULAR | Status: DC | PRN

## 2022-01-07 NOTE — Progress Notes (Signed)
Diagnosis: Crohn's Disease  Provider:  Marshell Garfinkel, MD  Procedure: Infusion  IV Type: Peripheral, IV Location: L Antecubital  Entyvio (Vedolizumab), Dose: 300 mg  Infusion Start Time: 4949  Infusion Stop Time: 1440  Post Infusion IV Care: Peripheral IV Discontinued  Discharge: Condition: Good, Destination: Home . AVS provided to patient.   Performed by:  Koren Shiver, RN

## 2022-01-25 DIAGNOSIS — R293 Abnormal posture: Secondary | ICD-10-CM | POA: Diagnosis not present

## 2022-01-25 DIAGNOSIS — M25611 Stiffness of right shoulder, not elsewhere classified: Secondary | ICD-10-CM | POA: Diagnosis not present

## 2022-01-25 DIAGNOSIS — M25511 Pain in right shoulder: Secondary | ICD-10-CM | POA: Diagnosis not present

## 2022-01-25 DIAGNOSIS — M62511 Muscle wasting and atrophy, not elsewhere classified, right shoulder: Secondary | ICD-10-CM | POA: Diagnosis not present

## 2022-01-30 DIAGNOSIS — H6123 Impacted cerumen, bilateral: Secondary | ICD-10-CM | POA: Diagnosis not present

## 2022-02-05 DIAGNOSIS — M62511 Muscle wasting and atrophy, not elsewhere classified, right shoulder: Secondary | ICD-10-CM | POA: Diagnosis not present

## 2022-02-05 DIAGNOSIS — M25511 Pain in right shoulder: Secondary | ICD-10-CM | POA: Diagnosis not present

## 2022-02-05 DIAGNOSIS — M25611 Stiffness of right shoulder, not elsewhere classified: Secondary | ICD-10-CM | POA: Diagnosis not present

## 2022-02-05 DIAGNOSIS — R293 Abnormal posture: Secondary | ICD-10-CM | POA: Diagnosis not present

## 2022-02-12 DIAGNOSIS — R293 Abnormal posture: Secondary | ICD-10-CM | POA: Diagnosis not present

## 2022-02-12 DIAGNOSIS — M62511 Muscle wasting and atrophy, not elsewhere classified, right shoulder: Secondary | ICD-10-CM | POA: Diagnosis not present

## 2022-02-12 DIAGNOSIS — M25611 Stiffness of right shoulder, not elsewhere classified: Secondary | ICD-10-CM | POA: Diagnosis not present

## 2022-02-12 DIAGNOSIS — M25511 Pain in right shoulder: Secondary | ICD-10-CM | POA: Diagnosis not present

## 2022-02-16 DIAGNOSIS — M25511 Pain in right shoulder: Secondary | ICD-10-CM | POA: Diagnosis not present

## 2022-02-25 DIAGNOSIS — R293 Abnormal posture: Secondary | ICD-10-CM | POA: Diagnosis not present

## 2022-02-25 DIAGNOSIS — M25511 Pain in right shoulder: Secondary | ICD-10-CM | POA: Diagnosis not present

## 2022-02-25 DIAGNOSIS — M25611 Stiffness of right shoulder, not elsewhere classified: Secondary | ICD-10-CM | POA: Diagnosis not present

## 2022-02-25 DIAGNOSIS — M62511 Muscle wasting and atrophy, not elsewhere classified, right shoulder: Secondary | ICD-10-CM | POA: Diagnosis not present

## 2022-02-26 DIAGNOSIS — M21612 Bunion of left foot: Secondary | ICD-10-CM | POA: Diagnosis not present

## 2022-02-26 DIAGNOSIS — M76821 Posterior tibial tendinitis, right leg: Secondary | ICD-10-CM | POA: Diagnosis not present

## 2022-02-26 DIAGNOSIS — M76822 Posterior tibial tendinitis, left leg: Secondary | ICD-10-CM | POA: Diagnosis not present

## 2022-02-26 DIAGNOSIS — M21611 Bunion of right foot: Secondary | ICD-10-CM | POA: Diagnosis not present

## 2022-03-02 DIAGNOSIS — M1711 Unilateral primary osteoarthritis, right knee: Secondary | ICD-10-CM | POA: Diagnosis not present

## 2022-03-04 ENCOUNTER — Ambulatory Visit (INDEPENDENT_AMBULATORY_CARE_PROVIDER_SITE_OTHER): Payer: Medicare Other

## 2022-03-04 VITALS — BP 126/76 | HR 67 | Temp 98.3°F | Resp 18 | Ht 63.0 in | Wt 172.8 lb

## 2022-03-04 DIAGNOSIS — K50818 Crohn's disease of both small and large intestine with other complication: Secondary | ICD-10-CM

## 2022-03-04 MED ORDER — VEDOLIZUMAB 300 MG IV SOLR
300.0000 mg | Freq: Once | INTRAVENOUS | Status: AC
  Administered 2022-03-04: 300 mg via INTRAVENOUS
  Filled 2022-03-04: qty 5

## 2022-03-04 NOTE — Progress Notes (Signed)
Diagnosis: Crohn's Disease ? ?Provider:  Marshell Garfinkel, MD ? ?Procedure: Infusion ? ?IV Type: Peripheral, IV Location: L Hand ? ?Entyvio (Vedolizumab), Dose: 300 mg ? ?Infusion Start Time: 4665 ? ?Infusion Stop Time: 9935 ? ?Post Infusion IV Care: Peripheral IV Discontinued ? ?Discharge: Condition: Good, Destination: Home . AVS provided to patient.  ? ?Performed by:  Avaleigh Decuir, RN  ?  ?

## 2022-03-05 DIAGNOSIS — M62511 Muscle wasting and atrophy, not elsewhere classified, right shoulder: Secondary | ICD-10-CM | POA: Diagnosis not present

## 2022-03-05 DIAGNOSIS — M25511 Pain in right shoulder: Secondary | ICD-10-CM | POA: Diagnosis not present

## 2022-03-05 DIAGNOSIS — R293 Abnormal posture: Secondary | ICD-10-CM | POA: Diagnosis not present

## 2022-03-05 DIAGNOSIS — M25611 Stiffness of right shoulder, not elsewhere classified: Secondary | ICD-10-CM | POA: Diagnosis not present

## 2022-03-07 ENCOUNTER — Other Ambulatory Visit: Payer: Self-pay | Admitting: Cardiovascular Disease

## 2022-03-10 DIAGNOSIS — M1711 Unilateral primary osteoarthritis, right knee: Secondary | ICD-10-CM | POA: Diagnosis not present

## 2022-03-12 ENCOUNTER — Other Ambulatory Visit: Payer: Self-pay | Admitting: Cardiovascular Disease

## 2022-03-12 DIAGNOSIS — M62511 Muscle wasting and atrophy, not elsewhere classified, right shoulder: Secondary | ICD-10-CM | POA: Diagnosis not present

## 2022-03-12 DIAGNOSIS — M25611 Stiffness of right shoulder, not elsewhere classified: Secondary | ICD-10-CM | POA: Diagnosis not present

## 2022-03-12 DIAGNOSIS — M25511 Pain in right shoulder: Secondary | ICD-10-CM | POA: Diagnosis not present

## 2022-03-12 DIAGNOSIS — R293 Abnormal posture: Secondary | ICD-10-CM | POA: Diagnosis not present

## 2022-03-18 DIAGNOSIS — M1711 Unilateral primary osteoarthritis, right knee: Secondary | ICD-10-CM | POA: Diagnosis not present

## 2022-03-19 DIAGNOSIS — M62511 Muscle wasting and atrophy, not elsewhere classified, right shoulder: Secondary | ICD-10-CM | POA: Diagnosis not present

## 2022-03-19 DIAGNOSIS — R293 Abnormal posture: Secondary | ICD-10-CM | POA: Diagnosis not present

## 2022-03-19 DIAGNOSIS — M25511 Pain in right shoulder: Secondary | ICD-10-CM | POA: Diagnosis not present

## 2022-03-19 DIAGNOSIS — M25611 Stiffness of right shoulder, not elsewhere classified: Secondary | ICD-10-CM | POA: Diagnosis not present

## 2022-04-01 DIAGNOSIS — R293 Abnormal posture: Secondary | ICD-10-CM | POA: Diagnosis not present

## 2022-04-01 DIAGNOSIS — M25611 Stiffness of right shoulder, not elsewhere classified: Secondary | ICD-10-CM | POA: Diagnosis not present

## 2022-04-01 DIAGNOSIS — M62511 Muscle wasting and atrophy, not elsewhere classified, right shoulder: Secondary | ICD-10-CM | POA: Diagnosis not present

## 2022-04-01 DIAGNOSIS — M25511 Pain in right shoulder: Secondary | ICD-10-CM | POA: Diagnosis not present

## 2022-04-07 DIAGNOSIS — R350 Frequency of micturition: Secondary | ICD-10-CM | POA: Diagnosis not present

## 2022-04-09 DIAGNOSIS — M25511 Pain in right shoulder: Secondary | ICD-10-CM | POA: Diagnosis not present

## 2022-04-09 DIAGNOSIS — M25611 Stiffness of right shoulder, not elsewhere classified: Secondary | ICD-10-CM | POA: Diagnosis not present

## 2022-04-09 DIAGNOSIS — R293 Abnormal posture: Secondary | ICD-10-CM | POA: Diagnosis not present

## 2022-04-09 DIAGNOSIS — M62511 Muscle wasting and atrophy, not elsewhere classified, right shoulder: Secondary | ICD-10-CM | POA: Diagnosis not present

## 2022-04-14 DIAGNOSIS — M25611 Stiffness of right shoulder, not elsewhere classified: Secondary | ICD-10-CM | POA: Diagnosis not present

## 2022-04-14 DIAGNOSIS — M62511 Muscle wasting and atrophy, not elsewhere classified, right shoulder: Secondary | ICD-10-CM | POA: Diagnosis not present

## 2022-04-14 DIAGNOSIS — R293 Abnormal posture: Secondary | ICD-10-CM | POA: Diagnosis not present

## 2022-04-14 DIAGNOSIS — M25511 Pain in right shoulder: Secondary | ICD-10-CM | POA: Diagnosis not present

## 2022-04-23 DIAGNOSIS — R293 Abnormal posture: Secondary | ICD-10-CM | POA: Diagnosis not present

## 2022-04-23 DIAGNOSIS — M25511 Pain in right shoulder: Secondary | ICD-10-CM | POA: Diagnosis not present

## 2022-04-23 DIAGNOSIS — M25611 Stiffness of right shoulder, not elsewhere classified: Secondary | ICD-10-CM | POA: Diagnosis not present

## 2022-04-23 DIAGNOSIS — M62511 Muscle wasting and atrophy, not elsewhere classified, right shoulder: Secondary | ICD-10-CM | POA: Diagnosis not present

## 2022-04-27 ENCOUNTER — Emergency Department (HOSPITAL_BASED_OUTPATIENT_CLINIC_OR_DEPARTMENT_OTHER): Payer: Medicare Other

## 2022-04-27 ENCOUNTER — Encounter (HOSPITAL_BASED_OUTPATIENT_CLINIC_OR_DEPARTMENT_OTHER): Payer: Self-pay | Admitting: Emergency Medicine

## 2022-04-27 ENCOUNTER — Emergency Department (HOSPITAL_BASED_OUTPATIENT_CLINIC_OR_DEPARTMENT_OTHER)
Admission: EM | Admit: 2022-04-27 | Discharge: 2022-04-27 | Disposition: A | Discharge status: Home / Self Care | Payer: Medicare Other | Attending: Emergency Medicine | Admitting: Emergency Medicine

## 2022-04-27 ENCOUNTER — Other Ambulatory Visit: Payer: Self-pay

## 2022-04-27 ENCOUNTER — Ambulatory Visit: Payer: Medicare Other | Admitting: Student

## 2022-04-27 ENCOUNTER — Other Ambulatory Visit (HOSPITAL_BASED_OUTPATIENT_CLINIC_OR_DEPARTMENT_OTHER): Payer: Self-pay

## 2022-04-27 ENCOUNTER — Telehealth: Payer: Self-pay | Admitting: Cardiovascular Disease

## 2022-04-27 DIAGNOSIS — R079 Chest pain, unspecified: Secondary | ICD-10-CM | POA: Diagnosis not present

## 2022-04-27 DIAGNOSIS — Z79899 Other long term (current) drug therapy: Secondary | ICD-10-CM | POA: Diagnosis not present

## 2022-04-27 DIAGNOSIS — I159 Secondary hypertension, unspecified: Secondary | ICD-10-CM | POA: Diagnosis not present

## 2022-04-27 DIAGNOSIS — I10 Essential (primary) hypertension: Secondary | ICD-10-CM | POA: Diagnosis not present

## 2022-04-27 DIAGNOSIS — R0789 Other chest pain: Secondary | ICD-10-CM | POA: Diagnosis present

## 2022-04-27 LAB — CBC WITH DIFFERENTIAL/PLATELET
Abs Immature Granulocytes: 0.02 10*3/uL (ref 0.00–0.07)
Basophils Absolute: 0 10*3/uL (ref 0.0–0.1)
Basophils Relative: 0 %
Eosinophils Absolute: 0.1 10*3/uL (ref 0.0–0.5)
Eosinophils Relative: 1 %
HCT: 41.4 % (ref 36.0–46.0)
Hemoglobin: 13.2 g/dL (ref 12.0–15.0)
Immature Granulocytes: 0 %
Lymphocytes Relative: 23 %
Lymphs Abs: 1.3 10*3/uL (ref 0.7–4.0)
MCH: 26.5 pg (ref 26.0–34.0)
MCHC: 31.9 g/dL (ref 30.0–36.0)
MCV: 83 fL (ref 80.0–100.0)
Monocytes Absolute: 0.5 10*3/uL (ref 0.1–1.0)
Monocytes Relative: 8 %
Neutro Abs: 3.8 10*3/uL (ref 1.7–7.7)
Neutrophils Relative %: 68 %
Platelets: 207 10*3/uL (ref 150–400)
RBC: 4.99 MIL/uL (ref 3.87–5.11)
RDW: 13.6 % (ref 11.5–15.5)
WBC: 5.6 10*3/uL (ref 4.0–10.5)
nRBC: 0 % (ref 0.0–0.2)

## 2022-04-27 LAB — COMPREHENSIVE METABOLIC PANEL
ALT: 10 U/L (ref 0–44)
AST: 13 U/L — ABNORMAL LOW (ref 15–41)
Albumin: 4 g/dL (ref 3.5–5.0)
Alkaline Phosphatase: 53 U/L (ref 38–126)
Anion gap: 12 (ref 5–15)
BUN: 14 mg/dL (ref 8–23)
CO2: 25 mmol/L (ref 22–32)
Calcium: 9.7 mg/dL (ref 8.9–10.3)
Chloride: 106 mmol/L (ref 98–111)
Creatinine, Ser: 0.4 mg/dL — ABNORMAL LOW (ref 0.44–1.00)
GFR, Estimated: >60 mL/min (ref >60)
Glucose, Bld: 92 mg/dL (ref 70–99)
Potassium: 3.6 mmol/L (ref 3.5–5.1)
Sodium: 143 mmol/L (ref 135–145)
Total Bilirubin: 0.8 mg/dL (ref 0.3–1.2)
Total Protein: 6.7 g/dL (ref 6.5–8.1)

## 2022-04-27 LAB — TROPONIN I (HIGH SENSITIVITY): Troponin I (High Sensitivity): 4 ng/L (ref <18)

## 2022-04-27 MED ORDER — METOPROLOL SUCCINATE ER 50 MG PO TB24: 75.0000 mg | ORAL_TABLET | Freq: Every day | ORAL | 0 refills | Status: DC

## 2022-04-27 MED ORDER — HYDRALAZINE HCL 20 MG/ML IJ SOLN
10.0000 mg | Freq: Once | INTRAMUSCULAR | Status: AC
  Administered 2022-04-27: 10 mg via INTRAVENOUS
  Filled 2022-04-27: qty 1

## 2022-04-27 NOTE — ED Provider Notes (Signed)
[place] EMERGENCY DEPT Provider Note   CSN: [id number] Arrival date & time: 04/27/22  1113     History  Chief Complaint  Patient presents with   Hypertension    Jade Hernandez is a 81 y.o. female with PMHx of HTN, HLD, Crohn's disease, who presents with elevated blood pressures.  Patient's daughter present at bedside and assists with providing history. She has noted several months of elevated Bps while at her transfusion appointments for her Crohn's disease to 833A systolic. She reports in the last few weeks she has noted SBPs usually 120s-140s at home though intermittently as high as 160s-170s. The last two days her Bps have been more consistently elevated. This morning her BPs were 171/97 and 173/95 on repeat at home. Later in the morning Bps 204/104. She follows with Cardiology, Dr. Claiborne Billings, [facility] heart care, and called their office this morning. At that time she denied HA, CP, SOB, though reports " didn't feel right". Patient was told to present to the ED for further evaluation.  Patient reports elevated BPs at home as well as chest pressure without associated nausea, vomiting, radiation of symptoms to shoulder or jaw.  Chest pressure lasted for 1 hour and did not change with activity or rest.  She denies SOB, flu like symptoms, cough, headache, changes in vision, change in speech or swallowing, focal weakness, numbness/tingling.  Patient takes metoprolol 75 mg daily for blood pressure control.  Endorses adherence without missed doses.  Took her morning dose this morning.  No other recent medication changes.  No recent increase in stress.  Last cardiology OV 04/2021 at which time she was on 75 mg metoprolol daily and BP well controlled.  She is due for her next cardiology appointment.     Home Medications Prior to Admission medications   Medication Sig Start Date End Date Taking? Authorizing Provider  acetaminophen (TYLENOL) 500 MG tablet Take 1,000 mg by mouth in the  morning, at noon, in the evening, and at bedtime.   Yes [provider]  metoprolol succinate (TOPROL-XL) 50 MG 24 hr tablet TAKE 1 AND 1/2 TABLETS(75 MG) BY MOUTH DAILY 03/12/22  Yes Troy Sine, MD  Probiotic Product (PROBIOTIC DAILY PO) Take 1 capsule by mouth daily.   Yes [provider]  simvastatin (ZOCOR) 40 MG tablet Take 40 mg by mouth daily.   Yes [provider]  sodium chloride 0.9 % SOLN 250 mL with vedolizumab 300 MG SOLR 300 mg Inject 300 mg into the vein every 8 (eight) weeks. 11/20/21  Yes Gatha Mayer, MD  Vitamin D, Ergocalciferol, 50 MCG (2000 UT) CAPS Take 1 capsule by mouth daily.   Yes [provider]  COVID-19 mRNA bivalent vaccine, Pfizer, (PFIZER COVID-19 VAC BIVALENT) injection Inject into the muscle. 10/12/21   Carlyle Basques, MD  COVID-19 mRNA vaccine, Moderna, 100 MCG/0.5ML injection Inject into the muscle. 03/19/21   Carlyle Basques, MD      Allergies    Feraheme [ferumoxytol]    Review of Systems   Review of Systems  Constitutional:  Negative for chills, diaphoresis and fever.  HENT:  Negative for postnasal drip.   Respiratory:  Negative for cough and shortness of breath.   Cardiovascular:  Negative for chest pain and leg swelling.  Gastrointestinal:  Negative for abdominal pain, constipation, diarrhea, nausea and vomiting.  Neurological:  Negative for speech difficulty, weakness, light-headedness and headaches.  Psychiatric/Behavioral:  The patient is not nervous/anxious.     Physical Exam Updated  Vital Signs BP (!) 166/87   Pulse 76   Temp 98.2 F (36.8 C) (Oral)   Resp 15   SpO2 97%  Physical Exam Constitutional:      General: She is not in acute distress.    Appearance: She is not toxic-appearing.  HENT:     Head: Normocephalic and atraumatic.  Eyes:     General: No scleral icterus.    Conjunctiva/sclera: Conjunctivae normal.  Cardiovascular:     Rate and Rhythm: Normal rate and regular rhythm.      Heart sounds: Normal heart sounds.  Pulmonary:     Effort: Pulmonary effort is normal.     Breath sounds: Normal breath sounds.  Abdominal:     General: Abdomen is flat. There is no distension.     Tenderness: There is no abdominal tenderness.  Musculoskeletal:     Right lower leg: No edema.     Left lower leg: No edema.  Skin:    General: Skin is warm and dry.  Neurological:     Mental Status: She is alert and oriented to person, place, and time. Mental status is at baseline.  Psychiatric:        Mood and Affect: Mood normal.        Behavior: Behavior normal.     ED Results / Procedures / Treatments   Labs (all labs ordered are listed, but only abnormal results are displayed) Labs Reviewed  COMPREHENSIVE METABOLIC PANEL - Abnormal; Notable for the following components:      Result Value   Creatinine, Ser 0.40 (*)    AST 13 (*)    All other components within normal limits  CBC WITH DIFFERENTIAL/PLATELET  TROPONIN I (HIGH SENSITIVITY)    EKG None  Radiology DG Chest Portable 1 View  Result Date: 04/27/2022 CLINICAL DATA:  Chest pain EXAM: PORTABLE CHEST 1 VIEW COMPARISON:  Chest x-ray dated August 11, 2020 FINDINGS: Cardiac and mediastinal contours within normal limits. Mild bibasilar opacities, likely due to atelectasis. Lungs otherwise clear. No large pleural effusion or pneumothorax. IMPRESSION: Mild bibasilar opacities, likely due to atelectasis. Electronically Signed   By: Yetta Glassman M.D.   On: 04/27/2022 12:39     Medications Ordered in ED Medications  hydrALAZINE (APRESOLINE) injection 10 mg (has no administration in time range)    ED Course/ Medical Decision Making/ A&P                           Medical Decision Making  Jade Hernandez is a 81 y.o. female with PMHx of HTN, HLD, Crohn's disease, who presents with elevated blood pressures despite adherence to metoprolol.  Patient arrived with blood pressure 169/86 otherwise hemodynamically stable  satting well on room air.  Repeat blood pressures higher at 184/88.  Suspect earlier chest pressure secondary to HTN.  We will proceed with work-up for symptomatic hypertension. EKG showed NSR without signs of ischemia.  CBC unremarkable.  CMP and troponin pending.  CXR with mild bibasilar opacities likely secondary to atelectasis given no other respiratory symptoms.  Care assumed by attending physician.   Final Clinical Impression(s) / ED Diagnoses Final diagnoses:  None    Rx / DC Orders ED Discharge Orders     None         Wayland Denis, MD 01/74/94 4967    Gray, Telford P, DO 59/16/38 [id number]

## 2022-04-27 NOTE — Telephone Encounter (Signed)
Patient reports BP early during early morning of 171/97 and 173/95. Now 204/104 and, "I don't feel right". She denies headache, chest pain, or sob. Virgie Dad, PA-C advised and recommended patient be taken to ED or urgent care. Patient and family advised of this and voiced understanding.

## 2022-04-27 NOTE — Telephone Encounter (Signed)
I was asked in clinic to help give recommendations regarding patient's BP. She was added onto my clinic this afternoon for further evaluation of her BP. However, patient was worried that she needed to be seen sooner because she just "did not feel right." Advised RN that if she is having any symptoms or feels like she needs to be seen sooner than her appointment this afternoon that she should go to the ED.  Darreld Mclean, PA-C 04/27/2022 11:18 AM

## 2022-04-27 NOTE — Telephone Encounter (Signed)
Pt c/o BP issue: STAT if pt c/o blurred vision, one-sided weakness or slurred speech  1. What are your last 5 BP readings? 171/97; 173/95  2. Are you having any other symptoms (ex. Dizziness, headache, blurred vision, passed out)?    3. What is your BP issue? Is high   Schd patient for today at 1:30 with Sarajane Jews

## 2022-04-27 NOTE — ED Triage Notes (Signed)
Patient checking BP today and report BP is higher than normal. Todays reading 165/88 to highest 204/104. Took her prescribed metoprolol '75mg'$  around 7:30AM. Reports not feeling "up to par" but denies headache, chest pain, sob.

## 2022-04-29 ENCOUNTER — Ambulatory Visit (INDEPENDENT_AMBULATORY_CARE_PROVIDER_SITE_OTHER): Payer: Medicare Other

## 2022-04-29 VITALS — BP 131/77 | HR 61 | Temp 97.7°F | Resp 16 | Ht 63.0 in | Wt 173.0 lb

## 2022-04-29 DIAGNOSIS — K50818 Crohn's disease of both small and large intestine with other complication: Secondary | ICD-10-CM | POA: Diagnosis not present

## 2022-04-29 MED ORDER — VEDOLIZUMAB 300 MG IV SOLR
300.0000 mg | Freq: Once | INTRAVENOUS | Status: AC
  Administered 2022-04-29: 300 mg via INTRAVENOUS
  Filled 2022-04-29: qty 5

## 2022-04-29 NOTE — Progress Notes (Signed)
Diagnosis: Crohn's Disease  Provider:  Marshell Garfinkel, MD  Procedure: Infusion  IV Type: Peripheral, IV Location: L Antecubital  Entyvio (Vedolizumab), Dose: 300 mg  Infusion Start Time: 3833  Infusion Stop Time: 1500  Post Infusion IV Care: Peripheral IV Discontinued  Discharge: Condition: Good, Destination: Home . AVS provided to patient.   Performed by:  Koren Shiver, RN

## 2022-04-30 DIAGNOSIS — M25611 Stiffness of right shoulder, not elsewhere classified: Secondary | ICD-10-CM | POA: Diagnosis not present

## 2022-04-30 DIAGNOSIS — M25511 Pain in right shoulder: Secondary | ICD-10-CM | POA: Diagnosis not present

## 2022-04-30 DIAGNOSIS — M62511 Muscle wasting and atrophy, not elsewhere classified, right shoulder: Secondary | ICD-10-CM | POA: Diagnosis not present

## 2022-04-30 DIAGNOSIS — R293 Abnormal posture: Secondary | ICD-10-CM | POA: Diagnosis not present

## 2022-05-03 ENCOUNTER — Telehealth: Payer: Self-pay | Admitting: Physician Assistant

## 2022-05-03 NOTE — Telephone Encounter (Signed)
Patient is dealing with high blood pressure for past few days.  Her blood pressure running high despite increasing Toprol-XL to 100 mg in the morning.  This morning her blood pressure was 141/83 and again 146/82.  However this evening her blood pressure 177/97 abd 173/95.  Heart rate in the mid to upper 60s.  I have recommended to continue Toprol-XL 100 mg a.m. and add 50 mg p.m.  Keep track of blood pressure and heart rate and call office tomorrow for office visit.

## 2022-05-04 ENCOUNTER — Telehealth: Payer: Self-pay | Admitting: Nurse Practitioner

## 2022-05-04 ENCOUNTER — Ambulatory Visit (INDEPENDENT_AMBULATORY_CARE_PROVIDER_SITE_OTHER): Payer: Medicare Other | Admitting: Nurse Practitioner

## 2022-05-04 ENCOUNTER — Encounter: Payer: Self-pay | Admitting: Nurse Practitioner

## 2022-05-04 VITALS — BP 162/90 | HR 68 | Ht 63.0 in | Wt 171.6 lb

## 2022-05-04 DIAGNOSIS — I10 Essential (primary) hypertension: Secondary | ICD-10-CM

## 2022-05-04 DIAGNOSIS — E782 Mixed hyperlipidemia: Secondary | ICD-10-CM

## 2022-05-04 DIAGNOSIS — K50818 Crohn's disease of both small and large intestine with other complication: Secondary | ICD-10-CM | POA: Diagnosis not present

## 2022-05-04 MED ORDER — METOPROLOL SUCCINATE ER 100 MG PO TB24: 100.0000 mg | ORAL_TABLET | Freq: Every day | ORAL | 3 refills | Status: DC

## 2022-05-04 MED ORDER — VALSARTAN 80 MG PO TABS: 80.0000 mg | ORAL_TABLET | Freq: Every day | ORAL | 3 refills | Status: DC

## 2022-05-04 NOTE — Progress Notes (Signed)
Office Visit    Patient Name: Jade Hernandez Date of Encounter: 05/04/2022  Primary Care Provider:  [name], MD Primary Cardiologist:  Shelva Majestic, MD  Chief Complaint    81 year old female with a history of hypertension, hyperlipidemia, and Crohn's disease who presents for follow-up related to hypertension.   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    Allergies  Allergies  Allergen Reactions   Feraheme [Ferumoxytol] Shortness Of Breath and Palpitations    Severe pain all over    History of Present Illness    81year old female with the above past medical history including hypertension, hyperlipidemia, and Crohn's disease.  She has a strong family history of premature CAD.  Echocardiogram in December 2011 showed proximal septal thickening without outflow tract obstruction, normal LV function, G1 DD, mild MR and mild AI.  Lexiscan Myoview in 2008 revealed normal perfusion.  Echocardiogram in October 2021 showed EF 55 to 65%, mild AI, mild MR.  She was last seen in the office on 05/04/2021 was stable overall from a cardiac standpoint.  She did report increased personal stress in the setting of her husband having been diagnosed with cancer, Crohn's flares, and anemia.  She called our office on 04/27/2022 with complaints of elevated BP.  Stated she "did not feel right."  She was advised to go the ED. prior to that her PCP had increased her metoprolol from 50 mg daily to 75 mg daily.  She presented to the ED on 04/27/2022 with concern for hypertension, BP consistently in the 170s/90s, with readings as high as 201/104.  She did note mild chest pressure.  EKG showed no signs of ischemia, NSR.  CBC, CMP, and troponin were unremarkable. Chest x-ray showed mild bibasilar opacities thought to be secondary to atelectasis. She was discharged home in stable condition. She called the on-call answering service again on 05/03/2022 and reported ongoing elevated BP, particularly in the evening.  She was advised to take an additional 50 mg of metoprolol in the evening, for a total of 150 mg daily and follow-up as an outpatient.   She presents today for follow-up. Since her recent ED visit she has been stable from a cardiac standpoint. BP remains elevate above goal with home readings ranging from 140-170s/80s-90. She denies any associated symptoms with elevated BP readings. She denies symptoms concerning for angina.  Other than her ongoing elevated BP, she denies any additional concerns today.  Home Medications    Current Outpatient Medications  Medication Sig Dispense Refill   metoprolol succinate (TOPROL XL) 100 MG 24 hr tablet Take  1 tablet (100 mg total) by mouth daily. Take with or immediately following a meal. 90 tablet 3   valsartan (DIOVAN) 80 MG tablet Take 1 tablet (80 mg total) by mouth daily. 90 tablet 3   acetaminophen (TYLENOL) 500 MG tablet Take 1,000 mg by mouth in the morning, at noon, in the evening, and at bedtime.     COVID-19 mRNA bivalent vaccine, Pfizer, (PFIZER COVID-19 VAC BIVALENT) injection Inject into the muscle. 0.3 mL 0    COVID-19 mRNA vaccine, Moderna, 100 MCG/0.5ML injection Inject into the muscle. 0.25 mL 0   Probiotic Product (PROBIOTIC DAILY PO) Take 1 capsule by mouth daily.     simvastatin (ZOCOR) 40 MG tablet Take 40 mg by mouth daily.     sodium chloride 0.9 % SOLN 250 mL with vedolizumab 300 MG SOLR 300 mg Inject 300 mg into the vein every 8 (eight) weeks.     Vitamin D, Ergocalciferol, 50 MCG (2000 UT) CAPS Take 1 capsule by mouth daily.     No current facility-administered medications for this visit.     Review of Systems    She denies chest pain, palpitations, dyspnea, pnd, orthopnea, n, v, dizziness, syncope, edema, weight gain, or early satiety. All other systems reviewed and are otherwise negative except as noted above.   Physical Exam    VS:  BP (!) 162/90   Pulse 68   Ht '5\' 3"'$  (1.6 m)   Wt 171 lb 9.6 oz (77.8 kg)   SpO2 99%   BMI 30.40 kg/m  , BMI Body mass index is 30.4 kg/m.     GEN: Well nourished, well developed, in no acute distress. HEENT: normal. Neck: Supple, no JVD, carotid bruits, or masses. Cardiac: RRR, no murmurs, rubs, or gallops. No clubbing, cyanosis, edema.  Radials/DP/PT 2+ and equal bilaterally.  Respiratory:  Respirations regular and unlabored, clear to auscultation bilaterally. GI: Soft, nontender, nondistended, BS + x 4. MS: no deformity or atrophy. Skin: warm and dry, no rash. Neuro:  Strength and sensation are intact. Psych: Normal affect.  Accessory Clinical Findings    ECG personally reviewed by me today - No EKG in office today.  Lab Results  Component Value Date   WBC 5.6 04/27/2022   HGB 13.2 04/27/2022   HCT 41.4 04/27/2022   MCV 83.0 04/27/2022   PLT 207 04/27/2022   Lab Results  Component Value Date   CREATININE 0.40 (L) 04/27/2022   BUN 14 04/27/2022   NA 143 04/27/2022   K 3.6 04/27/2022   CL 106 04/27/2022   CO2 25 04/27/2022   Lab Results  Component Value Date   ALT 10 04/27/2022   AST 13 (L) 04/27/2022   ALKPHOS 53  04/27/2022   BILITOT 0.8 04/27/2022   No results found for: "CHOL", "HDL", "LDLCALC", "LDLDIRECT", "TRIG", "CHOLHDL"  No results found for: "HGBA1C"  Assessment & Plan    1. Hypertension: BP remains elevated above goal.  She is asymptomatic with elevated BP. She has been taking Toprolol-XL 150 mg daily with only mild improvement in BP.  Recommend continuing metoprolol 100 mg daily. Will add valsartan 80 mg daily.  Continue to monitor BP and report BP consistently> 130/80.  Plan for close follow-up with repeat BMET in 2 weeks.  2. Hyperlipidemia: LDL was 79 in February 2023.  Monitored and managed per PCP.  Continue simvastatin.  3. Crohn's disease: She receives vedolizumab infusions every 8 weeks. Denies any recent flare.  4. Disposition: Follow-up in 2 weeks.  Lenna Sciara, NP 05/04/2022, 12:14 PM

## 2022-05-04 NOTE — Telephone Encounter (Signed)
New Message:   Patient said she just saw Raquel Sarna. She wants to know when does she start taking  the new medicine?   .Pt c/o medication issue:  1. Name of Medication: Valsartan  2. How are you currently taking this medication (dosage and times per day)?   3. Are you having a reaction (difficulty breathing--STAT)?   4. What is your medication issue? When does she start taking it?

## 2022-05-04 NOTE — Telephone Encounter (Signed)
Verified with patient that she can start taking valsartan in the morning. She will keep a diary of her BP and P and bring it with her at her next appointment.

## 2022-05-04 NOTE — Patient Instructions (Signed)
Medication Instructions:  Start Valsartan 80 mg daily Metoprolol 100 mg daily   *If you need a refill on your cardiac medications before your next appointment, please call your pharmacy*   Lab Work: NONE ordered at this time of appointment   If you have labs (blood work) drawn today and your tests are completely normal, you will receive your results only by: [place] (if you have MyChart) OR A paper copy in the mail If you have any lab test that is abnormal or we need to change your treatment, we will call you to review the results.   Testing/Procedures: NONE ordered at this time of appointment     Follow-Up: At [facility], you and your health needs are our priority.  As part of our continuing mission to provide you with exceptional heart care, we have created designated Provider Care Teams.  These Care Teams include your primary Cardiologist (physician) and Advanced Practice Providers (APPs -  Physician Assistants and Nurse Practitioners) who all work together to provide you with the care you need, when you need it.  We recommend signing up for the patient portal called "MyChart".  Sign up information is provided on this After Visit Summary.  MyChart is used to connect with patients for Virtual Visits (Telemedicine).  Patients are able to view lab/test results, encounter notes, upcoming appointments, etc.  Non-urgent messages can be sent to your provider as well.   To learn more about what you can do with MyChart, go to [email or web address].    Your next appointment:   2 week(s)  The format for your next appointment:   In Person  Provider:   Diona Browner, NP        Other Instructions Monitor Blood pressure daily. Report blood pressure consistently greater than 130/80  Important Information About Sugar

## 2022-05-07 ENCOUNTER — Telehealth: Payer: Self-pay | Admitting: Cardiovascular Disease

## 2022-05-25 ENCOUNTER — Ambulatory Visit (INDEPENDENT_AMBULATORY_CARE_PROVIDER_SITE_OTHER): Payer: Medicare Other | Admitting: Nurse Practitioner

## 2022-05-25 ENCOUNTER — Encounter: Payer: Self-pay | Admitting: Nurse Practitioner

## 2022-05-25 VITALS — BP 126/64 | HR 67 | Ht 63.0 in | Wt 173.6 lb

## 2022-05-25 DIAGNOSIS — Z79899 Other long term (current) drug therapy: Secondary | ICD-10-CM

## 2022-05-25 DIAGNOSIS — K50818 Crohn's disease of both small and large intestine with other complication: Secondary | ICD-10-CM | POA: Diagnosis not present

## 2022-05-25 DIAGNOSIS — E782 Mixed hyperlipidemia: Secondary | ICD-10-CM

## 2022-05-25 DIAGNOSIS — I10 Essential (primary) hypertension: Secondary | ICD-10-CM

## 2022-05-25 NOTE — Patient Instructions (Addendum)
Medication Instructions:  Your physician recommends that you continue on your current medications as directed. Please refer to the Current Medication list given to you today.   *If you need a refill on your cardiac medications before your next appointment, please call your pharmacy*   Lab Work: Your physician recommends that you complete labs today BMET  If you have labs (blood work) drawn today and your tests are completely normal, you will receive your results only by: MyChart Message (if you have MyChart) OR A paper copy in the mail If you have any lab test that is abnormal or we need to change your treatment, we will call you to review the results.   Testing/Procedures: NONE ordered at this time of appointment     Follow-Up: At [facility], you and your health needs are our priority.  As part of our continuing mission to provide you with exceptional heart care, we have created designated Provider Care Teams.  These Care Teams include your primary Cardiologist (physician) and Advanced Practice Providers (APPs -  Physician Assistants and Nurse Practitioners) who all work together to provide you with the care you need, when you need it.  We recommend signing up for the patient portal called "MyChart".  Sign up information is provided on this After Visit Summary.  MyChart is used to connect with patients for Virtual Visits (Telemedicine).  Patients are able to view lab/test results, encounter notes, upcoming appointments, etc.  Non-urgent messages can be sent to your provider as well.   To learn more about what you can do with MyChart, go to [email or web address].    Your next appointment:   4-6 month(s)  The format for your next appointment:   In Person  Provider:   Shelva Majestic, MD     Other Instructions   Important Information About Sugar

## 2022-05-25 NOTE — Progress Notes (Signed)
Office Visit    Patient Name: Jade Hernandez Date of Encounter: 05/25/2022  Primary Care Provider:  [name], MD Primary Cardiologist:  Shelva Majestic, MD  Chief Complaint    81 year old female with a history of hypertension, hyperlipidemia, and Crohn's disease who presents for follow-up related to hypertension.   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    Allergies  Allergies  Allergen Reactions   Feraheme [Ferumoxytol] Shortness Of Breath and Palpitations    Severe pain all over    History of Present Illness    81year old female with the above past medical history including hypertension, hyperlipidemia, and Crohn's disease.   She has a strong family history of premature CAD. Echocardiogram in December 2011 showed proximal septal thickening without outflow tract obstruction, normal LV function, G1 DD, mild MR and mild AI.  Lexiscan Myoview in 2008 revealed normal perfusion.  Echocardiogram in October 2021 showed EF 55 to 65%, mild AI, mild MR.   She presented to the ED on 04/27/2022 with concern for hypertension, BP consistently in the  170s/90s, with readings as high as 201/104.  She did note mild chest pressure.  EKG showed no signs of ischemia, NSR.  CBC, CMP, and troponin were unremarkable. Chest x-ray showed mild bibasilar opacities thought to be secondary to atelectasis. She was discharged home in stable condition. She called the on-call answering service on 05/03/2022 and reported ongoing elevated BP, particularly in the evening. She was advised to take an additional 50 mg of metoprolol in the evening, for a total of 150 mg daily and follow-up as an outpatient.    She was last seen in the office on 05/04/2022 and was stable overall from a cardiac standpoint though she did note ongoing elevated BP with home readings ranging from 140-170s/80s-90.  Metoprolol was decreased to 100 mg daily and valsartan 80 mg daily was added to her antihypertensive regimen. She presents today for follow-up. Since her last visit she has done well from a cardiac standpoint.  BP has been well controlled.  She denies any dizziness, dyspnea, chest pain.  She notes some mild dependent nonpitting bilateral lower extremity edema.  Overall, she reports feeling well and denies any new concerns today.  Home Medications    Current Outpatient Medications  Medication Sig Dispense Refill   acetaminophen (TYLENOL) 500 MG tablet Take 1,000 mg by mouth in the morning, at noon, in the evening, and at bedtime.     COVID-19 mRNA bivalent vaccine, Pfizer, (PFIZER COVID-19 VAC BIVALENT) injection Inject into the muscle. 0.3 mL 0   COVID-19 mRNA vaccine,  Moderna, 100 MCG/0.5ML injection Inject into the muscle. 0.25 mL 0   metoprolol succinate (TOPROL XL) 100 MG 24 hr tablet Take 1 tablet (100 mg total) by mouth daily. Take with or immediately following a meal. 90 tablet 3   Probiotic Product (PROBIOTIC DAILY PO) Take 1 capsule by mouth daily.     simvastatin (ZOCOR) 40 MG tablet Take 40 mg by mouth daily.     sodium chloride 0.9 % SOLN 250 mL with vedolizumab 300 MG SOLR  300 mg Inject 300 mg into the vein every 8 (eight) weeks.     valsartan (DIOVAN) 80 MG tablet Take 1 tablet (80 mg total) by mouth daily. 90 tablet 3   Vitamin D, Ergocalciferol, 50 MCG (2000 UT) CAPS Take 1 capsule by mouth daily.     No current facility-administered medications for this visit.     Review of Systems    She denies chest pain, palpitations, dyspnea, pnd, orthopnea, n, v, dizziness, syncope, edema, weight gain, or early satiety. All other systems reviewed and are otherwise negative except as noted above.   Physical Exam    VS:  BP 126/64   Pulse 67   Ht '5\' 3"'$  (1.6 m)   Wt 173 lb 9.6 oz (78.7 kg)   SpO2 96%   BMI 30.75 kg/m   GEN: Well nourished, well developed, in no acute distress. HEENT: normal. Neck: Supple, no JVD, carotid bruits, or masses. Cardiac: RRR, no murmurs, rubs, or gallops. No clubbing, cyanosis, edema.  Radials/DP/PT 2+ and equal bilaterally.  Respiratory:  Respirations regular and unlabored, clear to auscultation bilaterally. GI: Soft, nontender, nondistended, BS + x 4. MS: no deformity or atrophy. Skin: warm and dry, no rash. Neuro:  Strength and sensation are intact. Psych: Normal affect.  Accessory Clinical Findings    ECG personally reviewed by me today -NSR, 67 bpm, no acute changes.   Lab Results  Component Value Date   WBC 5.6 04/27/2022   HGB 13.2 04/27/2022   HCT 41.4 04/27/2022   MCV 83.0 04/27/2022   PLT 207 04/27/2022   Lab Results  Component Value Date   CREATININE 0.40 (L) 04/27/2022   BUN 14 04/27/2022   NA 143 04/27/2022   K 3.6 04/27/2022   CL 106 04/27/2022   CO2 25 04/27/2022   Lab Results  Component Value Date   ALT 10 04/27/2022   AST 13 (L) 04/27/2022   ALKPHOS 53 04/27/2022   BILITOT 0.8 04/27/2022   No results found for: "CHOL", "HDL", "LDLCALC", "LDLDIRECT", "TRIG", "CHOLHDL"  No results found for: "HGBA1C"  Assessment & Plan    1. Hypertension: BP well controlled. Continue current  antihypertensive regimen.  Will check BMET today given recent valsartan.   2. Hyperlipidemia: LDL was 79 in February 2023.  Monitored and managed per PCP.  Continue simvastatin.   3. Crohn's disease: She receives vedolizumab infusions every 8 weeks. Denies any recent flares.   4. Disposition: Follow-up in 4-6 months with Dr. Claiborne Billings.   Lenna Sciara, NP 05/25/2022, 2:31 PM

## 2022-05-26 LAB — BASIC METABOLIC PANEL
BUN/Creatinine Ratio: 24 (ref 12–28)
BUN: 13 mg/dL (ref 8–27)
CO2: 25 mmol/L (ref 20–29)
Calcium: 9.5 mg/dL (ref 8.7–10.3)
Chloride: 105 mmol/L (ref 96–106)
Creatinine, Ser: 0.55 mg/dL — ABNORMAL LOW (ref 0.57–1.00)
Glucose: 95 mg/dL (ref 70–99)
Potassium: 4.7 mmol/L (ref 3.5–5.2)
Sodium: 142 mmol/L (ref 134–144)
eGFR: 93 mL/min/{1.73_m2} (ref >59)

## 2022-05-31 ENCOUNTER — Telehealth: Payer: Self-pay

## 2022-05-31 NOTE — Telephone Encounter (Signed)
Pt returned call. Pt was notified of lab results and will follow up as planned.

## 2022-05-31 NOTE — Telephone Encounter (Signed)
LMOM on pts machine to discuss lab results. Waiting on a return call.

## 2022-06-02 DIAGNOSIS — Z124 Encounter for screening for malignant neoplasm of cervix: Secondary | ICD-10-CM | POA: Diagnosis not present

## 2022-06-02 DIAGNOSIS — Z6832 Body mass index (BMI) 32.0-32.9, adult: Secondary | ICD-10-CM | POA: Diagnosis not present

## 2022-06-02 DIAGNOSIS — L292 Pruritus vulvae: Secondary | ICD-10-CM | POA: Diagnosis not present

## 2022-06-03 ENCOUNTER — Telehealth: Payer: Self-pay | Admitting: Internal Medicine

## 2022-06-03 NOTE — Telephone Encounter (Signed)
Inbound call from patient requesting a cal back. Patient did not want to disclose any further information. Please give a call back to advise.  Thank you

## 2022-06-03 NOTE — Telephone Encounter (Signed)
Patient states she needs to have some dental work done and possibly have teeth pulled.  She has Entivyo infusions every 8 weeks and would like to have approval by Dr Carlean Purl to have dental work.

## 2022-06-04 NOTE — Telephone Encounter (Signed)
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

## 2022-06-04 NOTE — Telephone Encounter (Signed)
Patient advised that it is OK to proceed with dental work per Dr Carlean Purl.  Patient verbalized understanding. No further questions.

## 2022-06-15 DIAGNOSIS — M25511 Pain in right shoulder: Secondary | ICD-10-CM | POA: Diagnosis not present

## 2022-06-15 DIAGNOSIS — M25561 Pain in right knee: Secondary | ICD-10-CM | POA: Diagnosis not present

## 2022-06-24 ENCOUNTER — Ambulatory Visit (INDEPENDENT_AMBULATORY_CARE_PROVIDER_SITE_OTHER): Payer: Medicare Other

## 2022-06-24 VITALS — BP 134/69 | HR 62 | Temp 97.6°F | Resp 18 | Ht 63.0 in | Wt 175.8 lb

## 2022-06-24 DIAGNOSIS — K50818 Crohn's disease of both small and large intestine with other complication: Secondary | ICD-10-CM | POA: Diagnosis not present

## 2022-06-24 MED ORDER — VEDOLIZUMAB 300 MG IV SOLR
300.0000 mg | Freq: Once | INTRAVENOUS | Status: AC
  Administered 2022-06-24: 300 mg via INTRAVENOUS
  Filled 2022-06-24: qty 5

## 2022-06-24 NOTE — Progress Notes (Signed)
Diagnosis: Crohn's Disease  Provider:  Marshell Garfinkel MD  Procedure: Infusion  IV Type: Peripheral, IV Location: R Antecubital  Entyvio (Vedolizumab), Dose: 300 mg  Infusion Start Time: 0932  Infusion Stop Time: 1500  Post Infusion IV Care: PIV Doscontinued  Discharge: Condition: Good, Destination: Home . AVS provided to patient.   Performed by:  Paul Dykes, RN

## 2022-06-28 DIAGNOSIS — M1990 Unspecified osteoarthritis, unspecified site: Secondary | ICD-10-CM | POA: Diagnosis not present

## 2022-06-28 DIAGNOSIS — I10 Essential (primary) hypertension: Secondary | ICD-10-CM | POA: Diagnosis not present

## 2022-06-28 DIAGNOSIS — E559 Vitamin D deficiency, unspecified: Secondary | ICD-10-CM | POA: Diagnosis not present

## 2022-06-28 DIAGNOSIS — R7303 Prediabetes: Secondary | ICD-10-CM | POA: Diagnosis not present

## 2022-06-28 DIAGNOSIS — E782 Mixed hyperlipidemia: Secondary | ICD-10-CM | POA: Diagnosis not present

## 2022-07-15 DIAGNOSIS — Z96652 Presence of left artificial knee joint: Secondary | ICD-10-CM | POA: Diagnosis not present

## 2022-07-15 DIAGNOSIS — M1711 Unilateral primary osteoarthritis, right knee: Secondary | ICD-10-CM | POA: Diagnosis not present

## 2022-07-27 ENCOUNTER — Encounter: Payer: Self-pay | Admitting: Internal Medicine

## 2022-07-27 ENCOUNTER — Ambulatory Visit (INDEPENDENT_AMBULATORY_CARE_PROVIDER_SITE_OTHER): Payer: Medicare Other | Admitting: Internal Medicine

## 2022-07-27 VITALS — BP 120/70 | HR 67 | Ht 63.0 in | Wt 177.1 lb

## 2022-07-27 DIAGNOSIS — K5080 Crohn's disease of both small and large intestine without complications: Secondary | ICD-10-CM | POA: Diagnosis not present

## 2022-07-27 DIAGNOSIS — Z7960 Long term (current) use of unspecified immunomodulators and immunosuppressants: Secondary | ICD-10-CM

## 2022-07-27 DIAGNOSIS — D500 Iron deficiency anemia secondary to blood loss (chronic): Secondary | ICD-10-CM | POA: Diagnosis not present

## 2022-07-27 NOTE — Patient Instructions (Signed)
Great to see you today.   Please mark your calendar to come around the first of December to have blood work. No appointment needed. The lab is open 8-5 Monday thru Friday. You do not have to be fasting.  Follow up with [place] in 6-9 months.  I appreciate the opportunity to care for you. Silvano Rusk, MD, [facility]

## 2022-07-27 NOTE — Progress Notes (Unsigned)
Jade Hernandez 81 y.o. September 23, 1941 [id number]  Assessment & Plan:   Encounter Diagnoses  Name Primary?   Crohn's disease of both small and large intestine without complication ([place]) Yes   Long-term use of immunosuppressant medication _ Entyvio    Iron deficiency anemia due to chronic blood loss    Patient is in clinical remission.  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 She had acute changes on biopsies of the colon and ileitis without obvious chronic changes on the terminal ileal biopsies. Treatment with budesonide was somewhat helpful we did pulsed therapy and then she had persistent problems and she has ended up on Entyvio since  April 2022. That is going well without side effects. She is here w/ daughter [name] today and says she feels well w/o abdominal pain, diarhea or bleeding. Has been told she is pre-diabetic by PCP - A1C 5.7% Appetite good  Entyvio infusions going well though last time had difficulty with IV placement and bruising afterward.  6/23 Hg 13.2 RDW 13     Wt Readings from Last 3 Encounters:  07/27/22 177 lb 2 oz (80.3 kg)  06/24/22 175 lb 12.8 oz (79.7 kg)  05/25/22 173 lb 9.6 oz (78.7 kg)      10/2021 Calprotectin, Fecal 0 - 120 ug/g 86   Allergies  Allergen Reactions   Feraheme  [Ferumoxytol] Shortness Of Breath and Palpitations    Severe pain all over   Current Meds  Medication Sig   acetaminophen (TYLENOL) 500 MG tablet Take 1,000 mg by mouth in the morning, at noon, in the evening, and at bedtime.   metoprolol succinate (TOPROL XL) 100 MG 24 hr tablet Take 1 tablet (100 mg total) by mouth daily. Take with or immediately following a meal.   Probiotic Product (PROBIOTIC DAILY PO) Take 1 capsule by mouth daily.   simvastatin (ZOCOR) 40 MG tablet Take 40 mg by mouth daily.   sodium chloride 0.9 % SOLN 250 mL with vedolizumab 300 MG SOLR 300 mg Inject 300 mg into the vein every 8 (eight) weeks.   valsartan (DIOVAN) 80 MG tablet Take 1 tablet (80 mg total) by mouth daily.   Vitamin D, Ergocalciferol, 50 MCG (2000 UT) CAPS Take 1 capsule by mouth daily.   [DISCONTINUED] COVID-19 mRNA bivalent vaccine, Pfizer, (PFIZER COVID-19 VAC BIVALENT) injection Inject into the muscle.   [DISCONTINUED] COVID-19 mRNA vaccine, Moderna, 100 MCG/0.5ML injection Inject into the muscle.   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   Review of Systems As above  Objective:   Physical Exam '@BP'$  120/70   Pulse 67   Ht '5\' 3"'$  (1.6 m)   Wt 177 lb 2 oz (80.3 kg)   BMI 31.38 kg/m @  General:  NAD Eyes:   anicteric Lungs:  clear Heart::  S1S2 no rubs, murmurs or gallops Abdomen:  soft and nontender, BS+ Ext:   no edema, cyanosis or clubbing    Data Reviewed:  See HPI

## 2022-07-28 ENCOUNTER — Encounter: Payer: Self-pay | Admitting: Internal Medicine

## 2022-08-12 DIAGNOSIS — Z23 Encounter for immunization: Secondary | ICD-10-CM | POA: Diagnosis not present

## 2022-08-17 ENCOUNTER — Ambulatory Visit (INDEPENDENT_AMBULATORY_CARE_PROVIDER_SITE_OTHER): Payer: Medicare Other

## 2022-08-17 VITALS — BP 126/71 | HR 60 | Temp 97.9°F | Resp 16 | Ht 63.0 in | Wt 175.0 lb

## 2022-08-17 DIAGNOSIS — K50818 Crohn's disease of both small and large intestine with other complication: Secondary | ICD-10-CM | POA: Diagnosis not present

## 2022-08-17 MED ORDER — VEDOLIZUMAB 300 MG IV SOLR
300.0000 mg | Freq: Once | INTRAVENOUS | Status: AC
  Administered 2022-08-17: 300 mg via INTRAVENOUS
  Filled 2022-08-17: qty 5

## 2022-08-17 NOTE — Progress Notes (Signed)
Diagnosis: Crohn's Disease  Provider:  Marshell Garfinkel MD  Procedure: Infusion  IV Type: Peripheral, IV Location: L Antecubital  Entyvio (Vedolizumab), Dose: 300 mg  Infusion Start Time: 1624  Infusion Stop Time: 1503  Post Infusion IV Care: Peripheral IV Discontinued  Discharge: Condition: Good, Destination: Home . AVS provided to patient.   Performed by:  Adelina Mings, LPN

## 2022-08-19 ENCOUNTER — Other Ambulatory Visit (HOSPITAL_BASED_OUTPATIENT_CLINIC_OR_DEPARTMENT_OTHER): Payer: Self-pay

## 2022-08-19 DIAGNOSIS — Z23 Encounter for immunization: Secondary | ICD-10-CM | POA: Diagnosis not present

## 2022-08-19 MED ORDER — INFLUENZA VAC A&B SA ADJ QUAD 0.5 ML IM PRSY
PREFILLED_SYRINGE | INTRAMUSCULAR | 0 refills | Status: DC
  Filled 2022-08-19: qty 0.5, 1d supply, fill #0

## 2022-08-24 IMAGING — DX DG CHEST 1V PORT
1 series · 1 of 1 positions shown · non-contrast
Comparison: Chest x-ray dated August 11, 2020

CLINICAL DATA: Chest pain

EXAM:
PORTABLE CHEST 1 VIEW

[chest ap]
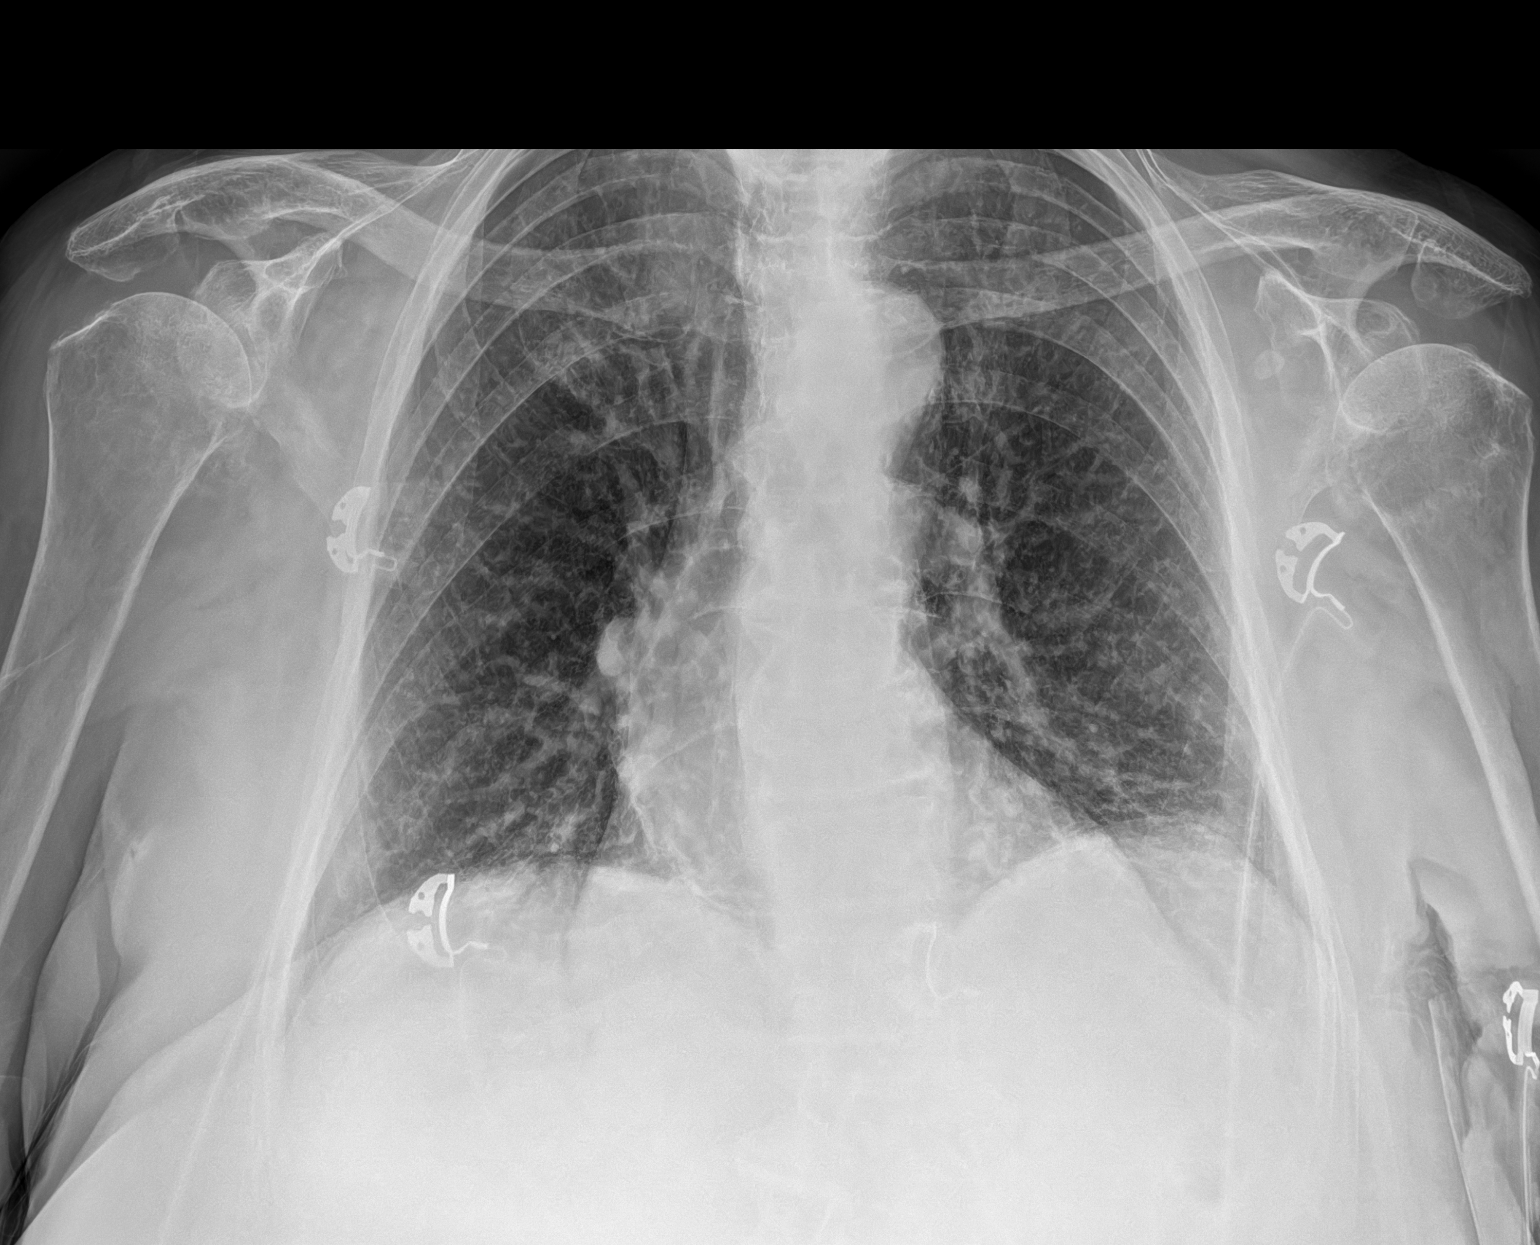

[1 of 1 positions shown; findings below may reference images not displayed]

FINDINGS: Cardiac and mediastinal contours within normal limits. Mild
bibasilar opacities, likely due to atelectasis. Lungs otherwise
clear. No large pleural effusion or pneumothorax.
IMPRESSION: Mild bibasilar opacities, likely due to atelectasis.

## 2022-10-12 ENCOUNTER — Ambulatory Visit (INDEPENDENT_AMBULATORY_CARE_PROVIDER_SITE_OTHER): Payer: Medicare Other

## 2022-10-12 VITALS — BP 137/75 | HR 67 | Temp 98.2°F | Resp 16 | Ht 63.0 in | Wt 174.2 lb

## 2022-10-12 DIAGNOSIS — K50818 Crohn's disease of both small and large intestine with other complication: Secondary | ICD-10-CM | POA: Diagnosis not present

## 2022-10-12 MED ORDER — VEDOLIZUMAB 300 MG IV SOLR
300.0000 mg | Freq: Once | INTRAVENOUS | Status: AC
  Administered 2022-10-12: 300 mg via INTRAVENOUS
  Filled 2022-10-12: qty 5

## 2022-10-12 NOTE — Progress Notes (Signed)
Diagnosis: Crohn's Disease  Provider:  Marshell Garfinkel MD  Procedure: Infusion  IV Type: Peripheral, IV Location: L Antecubital  Entyvio (Vedolizumab), Dose: 300 mg  Infusion Start Time: 6386  Infusion Stop Time: 1509  Post Infusion IV Care: Peripheral IV Discontinued  Discharge: Condition: Good, Destination: Home . AVS provided to patient.   Performed by:  Adelina Mings, LPN

## 2022-10-19 ENCOUNTER — Other Ambulatory Visit: Payer: Medicare Other

## 2022-10-19 DIAGNOSIS — Z7960 Long term (current) use of unspecified immunomodulators and immunosuppressants: Secondary | ICD-10-CM

## 2022-10-19 DIAGNOSIS — K5080 Crohn's disease of both small and large intestine without complications: Secondary | ICD-10-CM

## 2022-10-21 LAB — QUANTIFERON-TB GOLD PLUS
Mitogen-NIL: >10 IU/mL
NIL: 0.03 IU/mL
QuantiFERON-TB Gold Plus: NEGATIVE
TB1-NIL: 0 IU/mL
TB2-NIL: 0.01 IU/mL

## 2022-10-26 ENCOUNTER — Telehealth: Payer: Self-pay | Admitting: Internal Medicine

## 2022-10-26 NOTE — Telephone Encounter (Signed)
PT has severe diarrhea and wants to know what she should do. Please advise

## 2022-10-26 NOTE — Telephone Encounter (Signed)
Pt stated that she had diarrhea three times yesterday and three times today. Pt stated that she has no other symptoms: Eating a bland diet:  Pt was recommended to try Imodium to see if this would resolve her symptoms and if the symptoms don't resolve then to contact our office with an update: Pt questioned if these recommendations are  from Dr. Carlean Purl. I informed the pt that I was the nurse: Pt requested to have Dr. Carlean Purl notified of her symptoms and any recommendations that he has:   Please advise

## 2022-10-27 ENCOUNTER — Telehealth: Payer: Self-pay

## 2022-10-27 NOTE — Telephone Encounter (Signed)
I called today - she is better - did not need Imodium though that was good advice

## 2022-10-27 NOTE — Telephone Encounter (Signed)
   Pre-operative Risk Assessment    Patient Name: Jade Hernandez  DOB: 02-21-1941 MRN: [id number]      Request for Surgical Clearance    Procedure:   RT TOTAL KNEE ARTHROPLASTY  Date of Surgery:  Clearance 01/24/23                                 Surgeon:  DR Gaynelle Arabian  ATTN:[facility]geon's Group or Practice Name:  [facility]  Phone number:  [phone] Fax number:  [phone]    Type of Clearance Requested:   - Medical    Type of Anesthesia:   CHOICE   Additional requests/questions:    Altamese Cabal   10/27/2022, 3:40 PM

## 2022-10-27 NOTE — Telephone Encounter (Signed)
noted 

## 2022-10-28 NOTE — Telephone Encounter (Signed)
Pt scheduled to see Dr. Claiborne Billings on 12/21/22 for preop clearance

## 2022-10-28 NOTE — Telephone Encounter (Signed)
Primary Cardiologist:Thomas Claiborne Billings, MD  Chart reviewed as part of pre-operative protocol coverage. Because of Jade Hernandez's past medical history and time since last visit, he/she will require a follow-up visit in order to better assess preoperative cardiovascular risk.  Pre-op covering staff: - She was last seen July 2023 and is due for 6 month follow-up with Dr. Claiborne Billings. Surgery is 01/24/23 so we should have opportunity to see her in person to address this clearance. Please schedule appointment and call patient to inform them. - Please contact requesting surgeon's office via preferred method (i.e, phone, fax) to inform them of need for appointment prior to surgery.  If applicable, this message will also be routed to pharmacy pool and/or primary cardiologist for input on holding anticoagulant/antiplatelet agent as requested below so that this information is available at time of patient's appointment.   Emmaline Life, NP-C  10/28/2022, 9:59 AM [address], [address] Office [phone] Fax [phone]

## 2022-11-24 ENCOUNTER — Telehealth: Payer: Self-pay | Admitting: Pharmacy Technician

## 2022-11-24 NOTE — Telephone Encounter (Signed)
Auth Submission: NO AUTH NEEDED Payer: MEDICARE A/B & BCBS SUPP Medication & CPT/J Code(s) submitted: Entyvio (Vedolizumab) [id number] Route of submission (phone, fax, portal):  Phone # Fax # Auth type: Buy/Bill Units/visits requested: 7 VISITS Reference number:  Approval from: 11/24/22 to 11/25/23   Patient has upcoming appt 12/12/22, and will bring new BCBS supp card.

## 2022-11-30 ENCOUNTER — Encounter: Payer: Self-pay | Admitting: Internal Medicine

## 2022-12-06 DIAGNOSIS — Z1231 Encounter for screening mammogram for malignant neoplasm of breast: Secondary | ICD-10-CM | POA: Diagnosis not present

## 2022-12-07 ENCOUNTER — Ambulatory Visit (INDEPENDENT_AMBULATORY_CARE_PROVIDER_SITE_OTHER): Payer: Medicare Other

## 2022-12-07 VITALS — BP 159/77 | HR 62 | Temp 97.7°F | Resp 18 | Ht 63.0 in | Wt 176.4 lb

## 2022-12-07 DIAGNOSIS — K50818 Crohn's disease of both small and large intestine with other complication: Secondary | ICD-10-CM | POA: Diagnosis not present

## 2022-12-07 MED ORDER — VEDOLIZUMAB 300 MG IV SOLR
300.0000 mg | Freq: Once | INTRAVENOUS | Status: AC
  Administered 2022-12-07: 300 mg via INTRAVENOUS
  Filled 2022-12-07: qty 5

## 2022-12-07 NOTE — Progress Notes (Signed)
Diagnosis: Crohn's Disease  Provider:  Marshell Garfinkel MD  Procedure: Infusion  IV Type: Peripheral, IV Location: L Antecubital  Entyvio (Vedolizumab), Dose: 300 mg  Infusion Start Time: 3009  Infusion Stop Time: 1435  Post Infusion IV Care: Peripheral IV Discontinued  Discharge: Condition: Good, Destination: Home . AVS provided to patient.   Performed by:  Koren Shiver, RN

## 2022-12-20 ENCOUNTER — Telehealth: Payer: Self-pay | Admitting: Cardiology

## 2022-12-20 MED ORDER — VALSARTAN 160 MG PO TABS: 160.0000 mg | ORAL_TABLET | Freq: Every day | ORAL | 0 refills | Status: DC

## 2022-12-20 NOTE — Telephone Encounter (Signed)
Pt's son called that his mom's BP was 195/101 this AM,  she took her BP meds and now 45 min later is 185/91.    She will wait another hour if not below 953 systolic they will call office back.    Triage if you could call them in 1-2 hours to check with them  I did instruct if still symptomatic with dizziness to go to ER.    She is to see Dr. Claiborne Billings tomorrow.

## 2022-12-20 NOTE — Telephone Encounter (Signed)
Spoke with patient and daughter Jade Hernandez (permission). While on phone, BP 180/92, P 68. Denies SOB, chest pain; however reports frontal headache. She eats chocolate, 2 cups coffee/day and some sodium. Discussed dietary measures to help lower BP.  Dr. Claiborne Billings advised. Order to increase valsartan to '160mg'$  daily. Called patient back and explained dosage change. She will take another '80mg'$  when we hang up and take 2 of her '80mg'$  tablets starting tomorrow morning, along with her met succ '100mg'$ .

## 2022-12-21 ENCOUNTER — Encounter: Payer: Self-pay | Admitting: Cardiovascular Disease

## 2022-12-21 ENCOUNTER — Ambulatory Visit: Payer: Medicare Other | Attending: Cardiovascular Disease | Admitting: Cardiovascular Disease

## 2022-12-21 ENCOUNTER — Encounter: Payer: Self-pay | Admitting: Internal Medicine

## 2022-12-21 VITALS — BP 138/88 | HR 79 | Ht 63.0 in | Wt 173.8 lb

## 2022-12-21 DIAGNOSIS — E785 Hyperlipidemia, unspecified: Secondary | ICD-10-CM

## 2022-12-21 DIAGNOSIS — K219 Gastro-esophageal reflux disease without esophagitis: Secondary | ICD-10-CM | POA: Insufficient documentation

## 2022-12-21 DIAGNOSIS — R0989 Other specified symptoms and signs involving the circulatory and respiratory systems: Secondary | ICD-10-CM | POA: Diagnosis not present

## 2022-12-21 DIAGNOSIS — K50818 Crohn's disease of both small and large intestine with other complication: Secondary | ICD-10-CM | POA: Diagnosis not present

## 2022-12-21 DIAGNOSIS — Z01818 Encounter for other preprocedural examination: Secondary | ICD-10-CM | POA: Diagnosis not present

## 2022-12-21 DIAGNOSIS — E7800 Pure hypercholesterolemia, unspecified: Secondary | ICD-10-CM | POA: Diagnosis not present

## 2022-12-21 DIAGNOSIS — I10 Essential (primary) hypertension: Secondary | ICD-10-CM | POA: Insufficient documentation

## 2022-12-21 MED ORDER — VALSARTAN-HYDROCHLOROTHIAZIDE 160-12.5 MG PO TABS: 1.0000 | ORAL_TABLET | Freq: Every day | ORAL | 1 refills | Status: DC

## 2022-12-21 NOTE — Patient Instructions (Addendum)
Medication Instructions:  STOP Valsartan  START Valsartan-HCTZ (DIOVAN) 160-12.5 mg daily (start tomorrow 12/22/2022)  *If you need a refill on your cardiac medications before your next appointment, please call your pharmacy*   Testing/Procedures: Echocardiogram - Your physician has requested that you have an echocardiogram. Echocardiography is a painless test that uses sound waves to create images of your heart. It provides your doctor with information about the size and shape of your heart and how well your heart's chambers and valves are working. This procedure takes approximately one hour. There are no restrictions for this procedure.     Follow-Up: At [facility], you and your health needs are our priority.  As part of our continuing mission to provide you with exceptional heart care, we have created designated Provider Care Teams.  These Care Teams include your primary Cardiologist (physician) and Advanced Practice Providers (APPs -  Physician Assistants and Nurse Practitioners) who all work together to provide you with the care you need, when you need it.  We recommend signing up for the patient portal called "MyChart".  Sign up information is provided on this After Visit Summary.  MyChart is used to connect with patients for Virtual Visits (Telemedicine).  Patients are able to view lab/test results, encounter notes, upcoming appointments, etc.  Non-urgent messages can be sent to your provider as well.   To learn more about what you can do with MyChart, go to [email or web address].    Your next appointment:   3-4 week(s)  Provider:   Diona Browner, NP

## 2022-12-21 NOTE — Progress Notes (Signed)
Patient ID: Jade Hernandez, female   DOB: 01/27/1941, 82 y.o.   MRN: [id number]       PCP: Dr. [name]  HPI: Jade Hernandez is a 82 y.o. female who presents to the office for a 28 month cardiology evaluation.  She is the sister of my former patient Jade Hernandez who died in 02/15/2022.  Jade Hernandez has a strong family history for premature coronary disease and has a history of hypertension, palpitations, and hyperlipidemia. In December 2011 an ECHO Doppler study showed proximal septal thickening without outflow tract obstruction, normal LV systolic function but grade 1 diastolic dysfunction. There was mild LA dilatation, mild MAC with mild MR, TR and mild aortic valve sclerosis with mild AR. A nuclear perfusion study in February 16, 2007 revealed normal perfusion. Recently, she has noticed that her blood pressure has been tending to stay in the range of 140. She denies any chest tightness. She denies dyspnea. He denies palpitations. She presents for evaluation.  In August 2015 a F/U echo revealed normal renal function with a BUN of 14, creatine 0.43.  Fasting glucose was 85.  LFTs were normal.  Total cholesterol was 165, HDL 52, triglycerides 166, and LDL cholesterol 80.  When I saw her in 2015-02-16 she was doing well on a medical regimen with simvastatin 40 mg for hyperlipidemia. She takes Toprol-XL 75 mg and HCTZ 25 mg for palpitations and hypertension. She was on Prilosec for GERD.  She also is bothered by right knee pain for which she takes meloxicam 15 mg on an as-needed basis.    I saw her in February 2020 after not having seen her in over 2-1/2 years.  She had undergone left total knee arthroplasty in April 2018 and tolerated this well.  Since I last saw her, she was advised by her primary physician to reduce her Toprol dose down to 50 mg.  Subsequently, she noticed that her pulse is faster.  She also admits to occasional episodes of palpitations.  She  had issues with diarrhea and abdominal cramping and  has been evaluated by GI and is felt to have irritable bowel syndrome.  She will be undergoing a repeat colonoscopy in several weeks.  She denied chest pain PND orthopnea.  I last saw her on January 11, 2020.  She sees Dr. Serita Grammes for primary care.  She had her Covid vaccines.  She continues to have right knee discomfort and remotely had undergone left knee replacement.  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  She had undergone recent laboratory by Dr. Brigitte Pulse which showed a total cholesterol 140, HDL 50, LDL 66 and triglycerides 140.  She has continued to be on simvastatin.  She believes her blood pressure has been stable on Toprol-XL in addition to hydrochlorothiazide.    Since I last saw her, she was evaluated by Almyra Deforest, PA in June 2022 and most recently Florence Canner, NP on May 04, 2022 in follow-up of a ED evaluation on April 27, 2022 with blood pressure elevation with readings in the 170s over 90s and as high as 201/104.  At that evaluation, valsartan 80 mg was added to her metoprolol succinate 100 mg daily.  Presently, she has noted some blood pressure elevation and I called the office yesterday elevated at 1.LASTE 70.  SheNCBP Was advised to take extra dose of valsartan 80 mg in addition to the previous 80 mg that she had taken her  daily dose of 160.  She denies any chest pain or shortness of breath.  She has Crohn's disease which has been stable.  She is scheduled to undergo total knee arthroplasty by Dr. Reynaldo Minium on January 24, 2023.  She presents for follow-up evaluation and preoperative assessment.   Past Medical History:  Diagnosis Date   Adenomatous colon polyp    Arthritis    Crohn's disease of both small and large intestine with other complication ([place]) 69/04/2951   Diverticulosis    High cholesterol    Hypertension    IBS (irritable bowel syndrome)    Palpitations    UTI (urinary tract infection)     Past  Surgical History:  Procedure Laterality Date   BILATERAL CARPAL TUNNEL RELEASE     COLONOSCOPY     NM MYOCAR PERF WALL MOTION  10/02/2007   No significant ischemia demonstrated   TOTAL KNEE ARTHROPLASTY Left 02/21/2017   Procedure: LEFT TOTAL KNEE ARTHROPLASTY;  Surgeon: Gaynelle Arabian, MD;  Location: [facility];  Service: Orthopedics;  Laterality: Left;  requests 48mns; Adductor Block   [place]  10/29/2010   Proximal septal thickening noted, EF =>55%,LA mildly dilated,mild mitral annular ca+, AOV mildly sclerotic, mild AI.    Allergies  Allergen Reactions   Feraheme [Ferumoxytol] Shortness Of Breath and Palpitations    Severe pain all over    Current Outpatient Medications  Medication Sig Dispense Refill   acetaminophen (TYLENOL) 500 MG tablet Take 1,000 mg by mouth in the morning, at noon, in the evening, and at bedtime.     influenza vaccine adjuvanted (FLUAD) 0.5 ML injection Inject into the muscle. 0.5 mL 0   metoprolol succinate (TOPROL XL) 100 MG 24 hr tablet Take 1 tablet (100 mg total) by mouth daily. Take with or immediately following a meal. 90 tablet 3   Probiotic Product (PROBIOTIC DAILY PO) Take 1 capsule by mouth daily.     simvastatin (ZOCOR) 40 MG tablet Take 40 mg by mouth daily.     sodium chloride 0.9 % SOLN 250 mL with vedolizumab 300 MG SOLR 300 mg Inject 300 mg into the vein every 8 (eight) weeks.     traMADol (ULTRAM) 50 MG tablet Take 50-100 mg by mouth 2 (two) times daily as needed.     valsartan-hydrochlorothiazide (DIOVAN HCT) 160-12.5 MG tablet Take 1 tablet by mouth daily. 90 tablet 1   Vitamin D, Ergocalciferol, 50 MCG (2000 UT) CAPS Take 1 capsule by mouth daily.     No current facility-administered medications for this visit.    Social History   Socioeconomic History   Marital status: Married    Spouse name: Not on file   Number of children: 4   Years of education: Not on file   Highest education level: Not on file  Occupational History    Occupation: retired  Tobacco Use   Smoking status: Never   Smokeless tobacco: Never  Vaping Use   Vaping Use: Never used  Substance and Sexual Activity   Alcohol use: Yes    Alcohol/week: 1.0 standard drink of alcohol    Types: 1 Glasses of wine per week    Comment: occasional   Drug use: No   Sexual activity: Not on file  Other Topics Concern   Not on file  Social History Narrative   The patient is married she [place]  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 [employer]  She does have children 2 daughters 2 sons multiple grandchildren  Rare alcohol no tobacco or drug use   Social Determinants of Radio broadcast assistant Strain: Not on file  Food Insecurity: Not on file  Transportation Needs: Not on file  Physical Activity: Not on file  Stress: Not on file  Social Connections: Not on file  Intimate Partner Violence: Not on file   She is married and has 4 children 9 grandchildren. Her brother was my patient Mr. Reather Converse.  She states she cannot walk due to knee discomfort and often times tries to march in place for exercise.  Family History  Problem Relation Age of Onset   Hypertension Mother    Diabetes Mother    Heart disease Father        died from heart attack   Heart disease Brother    Colon cancer Neg Hx    Rectal cancer Neg Hx    Liver cancer Neg Hx    Stomach cancer Neg Hx    ROS General: Negative; No fevers, chills, or night sweats; no success with weight loss HEENT: Negative; No changes in vision or hearing, sinus congestion, difficulty swallowing Pulmonary: Negative; No cough, wheezing, shortness of breath, hemoptysis Cardiovascular: See HPI GI: Negative; No nausea, vomiting, diarrhea, or abdominal pain GU: Irritable bowel syndrome Musculoskeletal: Positive for right knee discomfort; status post left knee arthroplasty Hematologic/Oncology: Negative; no easy bruising, bleeding Endocrine: Negative; no heat/cold intolerance; no  diabetes Neuro: Negative; no changes in balance, headaches Skin: Negative; No rashes or skin lesions Psychiatric: Negative; No behavioral problems, depression Sleep: Negative; No snoring, daytime sleepiness, hypersomnolence, bruxism, restless legs, hypnogognic hallucinations, no cataplexy Other comprehensive 14 point system review is negative.  PE BP 138/88   Pulse 79   Ht '5\' 3"'$  (1.6 m)   Wt 173 lb 12.8 oz (78.8 kg)   SpO2 95%   BMI 30.79 kg/m    Repeat blood pressure by me was 160/84 supine and 156/82 sitting  Wt Readings from Last 3 Encounters:  12/21/22 173 lb 12.8 oz (78.8 kg)  12/07/22 176 lb 6.4 oz (80 kg)  10/12/22 174 lb 3.2 oz (79 kg)   General: Alert, oriented, no distress.  Skin: normal turgor, no rashes, warm and dry HEENT: Normocephalic, atraumatic. Pupils equal round and reactive to light; sclera anicteric; extraocular muscles intact; Fundi ** Nose without nasal septal hypertrophy Mouth/Parynx benign; Mallinpatti scale 3 Neck: No JVD, no carotid bruits; normal carotid upstroke Lungs: clear to ausculatation and percussion; no wheezing or rales Chest wall: without tenderness to palpitation Heart: PMI not displaced, RRR, s1 s2 normal, 1/6 systolic murmur, no diastolic murmur, no rubs, gallops, thrills, or heaves Abdomen: soft, nontender; no hepatosplenomehaly, BS+; abdominal aorta nontender and not dilated by palpation. Back: no CVA tenderness Pulses 2+ Musculoskeletal: full range of motion, normal strength, no joint deformities Extremities: no clubbing cyanosis or edema, Homan's sign negative  Neurologic: grossly nonfocal; Cranial nerves grossly wnl Psychologic: Normal mood and affect  December 21, 2022 ECG (independently read by me): Normal sinus rhythm at 79 bpm.  Nonspecific ST abnormality.  Normal intervals.  No ectopy  January 11, 2020 ECG (independently read by me): Normal sinus rhythm at 70 bpm.  No ectopy.  Normal intervals.  Early transition.  February  2020 ECG (independently read by me): NSR at 81; easrly transition, normal intervals  September 2017 ECG (independently read by me): Normal sinus rhythm at 73 bpm.  No ectopy.  Normal intervals.  June 2016 ECG (independently read by me): Normal sinus rhythm at  72 bpm.  Early transition.  No ectopy.  QTC 462 ms.  February 2015 ECG (independently read by me): Normal sinus rhythm at 69 beats per minute. Mild RV conduction delay. No ectopy.  LABS:    Latest Ref Rng & Units 05/25/2022    3:00 PM 04/27/2022   12:10 PM 11/03/2020    2:29 PM  BMP  Glucose 70 - 99 mg/dL 95  92  199   BUN 8 - 27 mg/dL '13  14  20   '$ Creatinine 0.57 - 1.00 mg/dL 0.55  0.40  0.44   BUN/Creat Ratio 12 - 28 24     Sodium 134 - 144 mmol/L 142  143  137   Potassium 3.5 - 5.2 mmol/L 4.7  3.6  4.1   Chloride 96 - 106 mmol/L 105  106  104   CO2 20 - 29 mmol/L '25  25  27   '$ Calcium 8.7 - 10.3 mg/dL 9.5  9.7  8.5        Latest Ref Rng & Units 04/27/2022   12:10 PM 11/03/2020    2:29 PM 10/13/2020    2:14 PM  Hepatic Function  Total Protein 6.5 - 8.1 g/dL 6.7  6.2  6.6   Albumin 3.5 - 5.0 g/dL 4.0  3.0  2.7   AST 15 - 41 U/L '13  10  19   '$ ALT 0 - 44 U/L '10  11  19   '$ Alk Phosphatase 38 - 126 U/L 53  51  56   Total Bilirubin 0.3 - 1.2 mg/dL 0.8  0.3  0.3       Latest Ref Rng & Units 04/27/2022   12:10 PM 01/06/2021    2:44 PM 11/03/2020    2:29 PM  CBC  WBC 4.0 - 10.5 K/uL 5.6  5.9  4.1   Hemoglobin 12.0 - 15.0 g/dL 13.2  13.0  11.1   Hematocrit 36.0 - 46.0 % 41.4  40.0  35.1   Platelets 150 - 400 K/uL 207  226.0  302.0    Lab Results  Component Value Date   TSH 2.86 07/08/2020    Lipid Panel  No results found for: "CHOL", "TRIG", "HDL", "CHOLHDL", "VLDL", "LDLCALC", "LDLDIRECT"  RADIOLOGY: No results found.  IMPRESSION:  1. Essential hypertension   2. Labile blood pressure   3. Pure hypercholesterolemia   4. Gastroesophageal reflux disease, unspecified whether esophagitis present   5. Crohn's disease  of both small and large intestine with other complication ([place])   6. Preoperative clearance     ASSESSMENT AND PLAN: Ms Iwai is a very pleasant 82 year old female who has a history of hypertension, palpitations, hyperlipidemia.  When I had seen her previously, she had stabilized nicely with reference to palpitations and blood pressure on a regimen consisting of Toprol-XL 75 mg in addition to HCTZ.  Her dose of metoprolol succinate had been reduced by her primary physician in the past which led to increase sensation of fast heart rate.  I have not seen her in almost 3 years.  When last seen by Florence Canner on May 25, 2022 valsartan 80 mg was added to her blood pressure regimen of metoprolol succinate 100 mg daily.  Recently, she again has noticed blood pressure lability and when she called the office yesterday she was advised to increase her valsartan to 160 mg.  She had gotten up around 2 AM this morning and had taken her blood pressure and it still remained elevated and she took  her 160 mg at 2 AM today.  There is no significant leg swelling but at times she has had some issues with ankle edema.  Her blood pressure today on repeat by me was still elevated.  I reviewed with her most recent Heart Association criteria for hypertension with stage I hypertension given commencing at 130/80 and stage II hypertension at 140/90.  Presently, I am recommending she undergo a 2D echo Doppler study for follow-up evaluation of her systolic and diastolic function wall thickness and valvular architecture.  I will change her valsartan to valsartan HCT 160/12.5 mg which should provide additional benefit for more optimal blood pressure control.  She continues to be on vedolizumab infusions every 8 weeks for her Crohn's disease.  She continues to be on simvastatin 40 mg for hyperlipidemia with LDL in February 2023 at 16.  I will contact her regarding her echo results.  She will monitor her blood pressure.  I will have her see  Florence Canner in 2 to 3 weeks for reassessment and at which time she will be given clearance for her upcoming total knee arthroplasty with Dr. Lorre Nick on January 24, 2023.   Troy Sine, MD, [facility]  12/23/2022 4:08 PM

## 2022-12-22 ENCOUNTER — Telehealth: Payer: Self-pay | Admitting: Cardiovascular Disease

## 2022-12-22 NOTE — Telephone Encounter (Signed)
Spoke to patient and daughter.  Per Dr Claiborne Billings it will be okay to use Meclizine 25 mg - follow direction on over the counter bottle.   Daughter voiced understanding.

## 2022-12-22 NOTE — Telephone Encounter (Signed)
Pt c/o medication issue:  1. Name of Medication: Medizine HCI '25mg'$   2. How are you currently taking this medication (dosage and times per day)?   3. Are you having a reaction (difficulty breathing--STAT)?   4. What is your medication issue? Patient wants to know if she can take this medication for vertigo. Patient would like to know tonight if she can take it.

## 2022-12-23 ENCOUNTER — Encounter: Payer: Self-pay | Admitting: Cardiovascular Disease

## 2022-12-24 DIAGNOSIS — I10 Essential (primary) hypertension: Secondary | ICD-10-CM | POA: Diagnosis not present

## 2022-12-24 DIAGNOSIS — E782 Mixed hyperlipidemia: Secondary | ICD-10-CM | POA: Diagnosis not present

## 2022-12-24 DIAGNOSIS — Z0000 Encounter for general adult medical examination without abnormal findings: Secondary | ICD-10-CM | POA: Diagnosis not present

## 2022-12-24 DIAGNOSIS — D509 Iron deficiency anemia, unspecified: Secondary | ICD-10-CM | POA: Diagnosis not present

## 2022-12-24 DIAGNOSIS — K50019 Crohn's disease of small intestine with unspecified complications: Secondary | ICD-10-CM | POA: Diagnosis not present

## 2022-12-24 DIAGNOSIS — R42 Dizziness and giddiness: Secondary | ICD-10-CM | POA: Diagnosis not present

## 2022-12-24 DIAGNOSIS — J309 Allergic rhinitis, unspecified: Secondary | ICD-10-CM | POA: Diagnosis not present

## 2022-12-24 DIAGNOSIS — E669 Obesity, unspecified: Secondary | ICD-10-CM | POA: Diagnosis not present

## 2022-12-24 DIAGNOSIS — N3281 Overactive bladder: Secondary | ICD-10-CM | POA: Diagnosis not present

## 2022-12-24 DIAGNOSIS — R7303 Prediabetes: Secondary | ICD-10-CM | POA: Diagnosis not present

## 2022-12-24 DIAGNOSIS — E559 Vitamin D deficiency, unspecified: Secondary | ICD-10-CM | POA: Diagnosis not present

## 2022-12-24 DIAGNOSIS — M899 Disorder of bone, unspecified: Secondary | ICD-10-CM | POA: Diagnosis not present

## 2022-12-27 ENCOUNTER — Telehealth: Payer: Self-pay

## 2022-12-27 ENCOUNTER — Telehealth: Payer: Self-pay | Admitting: Internal Medicine

## 2022-12-27 NOTE — Telephone Encounter (Signed)
Pt stated that she started having diarrhea after she increased her Valsartan from 80 to 160 mg tablets. Pt stated that she is taking some imodium but she is still experiencing the diarrhea. Pt notified to reach out to her Provide of the Valsartan and make him aware of the side effects that she is experiencing.  Pt verbalized understanding with all questions answered.

## 2022-12-27 NOTE — Telephone Encounter (Signed)
Inbound call from pt, requesting to speak with a nurse , she have an appt on 4/16 with Dr. Carlean Purl but she is having severe diarrhea and want to know what she can to about it .Marland KitchenMarland KitchenPlease advise

## 2022-12-27 NOTE — H&P (Signed)
TOTAL KNEE ADMISSION H&P  Patient is being admitted for right total knee arthroplasty.  Subjective:  Chief Complaint: Right knee pain.  HPI: Jade Hernandez, 82 y.o. female has a history of pain and functional disability in the right knee due to arthritis and has failed non-surgical conservative treatments for greater than 12 weeks to include NSAID's and/or analgesics, corticosteriod injections, use of assistive devices, and activity modification. Onset of symptoms was gradual, starting several years ago with gradually worsening course since that time. The patient noted no past surgery on the right knee.  Patient currently rates pain in the right knee at 7 out of 10 with activity. Patient has night pain, worsening of pain with activity and weight bearing, and pain that interferes with activities of daily living. Patient has evidence of  bone-on-bone arthritis in the medial and patellofemoral compartments of the right knee with varus deformity  by imaging studies. There is no active infection.  Patient Active Problem List   Diagnosis Date Noted   Long-term use of immunosuppressant medication _ Entyvio 02/16/2021   Iron deficiency anemia due to chronic blood loss 11/25/2020   Crohn's disease of both small and large intestine with other complication ([place]) [id number]   OA (osteoarthritis) of knee 02/21/2017   History of palpitations 04/22/2015   Hyperlipidemia 01/11/2014   GERD (gastroesophageal reflux disease) 01/11/2014   Family history of coronary artery disease 01/11/2014   Osteoarthritis of knee 06/17/2012   Hypertension     Past Medical History:  Diagnosis Date   Adenomatous colon polyp    Arthritis    Crohn's disease of both small and large intestine with other complication ([place]) 123456   Diverticulosis    High cholesterol    Hypertension    IBS (irritable bowel syndrome)    Palpitations    UTI (urinary tract infection)     Past Surgical History:  Procedure Laterality  Date   BILATERAL CARPAL TUNNEL RELEASE     COLONOSCOPY     NM MYOCAR PERF WALL MOTION  10/02/2007   No significant ischemia demonstrated   TOTAL KNEE ARTHROPLASTY Left 02/21/2017   Procedure: LEFT TOTAL KNEE ARTHROPLASTY;  Surgeon: Gaynelle Arabian, MD;  Location: [facility];  Service: Orthopedics;  Laterality: Left;  requests 13mns; Adductor Block   UKoreaECHOCARDIOGRAPHY  10/29/2010   Proximal septal thickening noted, EF =>55%,LA mildly dilated,mild mitral annular ca+, AOV mildly sclerotic, mild AI.    Prior to Admission medications   Medication Sig Start Date End Date Taking? Authorizing Provider  acetaminophen (TYLENOL) 500 MG tablet Take 1,000 mg by mouth in the morning, at noon, in the evening, and at bedtime.    [provider]  influenza vaccine adjuvanted (FLUAD) 0.5 ML injection Inject into the muscle. 08/19/22   SCarlyle Basques MD  metoprolol succinate (TOPROL XL) 100 MG 24 hr tablet Take 1 tablet (100 mg total) by mouth daily. Take with or immediately following a meal. 05/04/22   Monge, EHelane Gunther NP  Probiotic Product (PROBIOTIC DAILY PO) Take 1 capsule by mouth daily.    [provider]  simvastatin (ZOCOR) 40 MG tablet Take 40 mg by mouth daily.    [provider]  sodium chloride 0.9 % SOLN 250 mL with vedolizumab 300 MG SOLR 300 mg Inject 300 mg into the vein every 8 (eight) weeks. 11/20/21   GGatha Mayer MD  traMADol (ULTRAM) 50 MG tablet Take 50-100 mg by mouth 2 (two) times daily as needed.    [provider]  valsartan-hydrochlorothiazide (DIOVAN HCT) 160-12.5 MG tablet Take 1 tablet by mouth daily. 12/21/22   Troy Sine, MD  Vitamin D, Ergocalciferol, 50 MCG (2000 UT) CAPS Take 1 capsule by mouth daily.    [provider]    Allergies  Allergen Reactions   Feraheme [Ferumoxytol] Shortness Of Breath and Palpitations    Severe pain all over    Social History   Socioeconomic History   Marital status: Married    Spouse name: Not  on file   Number of children: 4   Years of education: Not on file   Highest education level: Not on file  Occupational History   Occupation: retired  Tobacco Use   Smoking status: Never   Smokeless tobacco: Never  Vaping Use   Vaping Use: Never used  Substance and Sexual Activity   Alcohol use: Yes    Alcohol/week: 1.0 standard drink of alcohol    Types: 1 Glasses of wine per week    Comment: occasional   Drug use: No   Sexual activity: Not on file  Other Topics Concern   Not on file  Social History Narrative   The patient is married she is originally from [place]   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 [employer]   She does have children 2 daughters 2 sons multiple grandchildren   Rare alcohol no tobacco or drug use   Social Determinants of Radio broadcast assistant Strain: Not on file  Food Insecurity: Not on file  Transportation Needs: Not on file  Physical Activity: Not on file  Stress: Not on file  Social Connections: Not on file  Intimate Partner Violence: Not on file    Tobacco Use: Low Risk  (12/23/2022)   Patient History    Smoking Tobacco Use: Never    Smokeless Tobacco Use: Never    Passive Exposure: Not on file   Social History   Substance and Sexual Activity  Alcohol Use Yes   Alcohol/week: 1.0 standard drink of alcohol   Types: 1 Glasses of wine per week   Comment: occasional    Family History  Problem Relation Age of Onset   Hypertension Mother    Diabetes Mother    Heart disease Father        died from heart attack   Heart disease Brother    Colon cancer Neg Hx    Rectal cancer Neg Hx    Liver cancer Neg Hx    Stomach cancer Neg Hx     Review of Systems  Constitutional:  Negative for chills and fever.  HENT: Negative.    Eyes: Negative.   Respiratory:  Negative for cough and shortness of breath.   Cardiovascular:  Negative for chest pain and palpitations.  Gastrointestinal:  Negative for abdominal pain, constipation, diarrhea, nausea and  vomiting.  Genitourinary:  Negative for dysuria, frequency and urgency.  Musculoskeletal:  Positive for joint pain.  Skin:  Negative for rash.   Objective:  Physical Exam: Well nourished and well developed.  General: Alert and oriented x3, cooperative and pleasant, no acute distress.  Head: normocephalic, atraumatic, neck supple.  Eyes: EOMI. Abdomen: non-tender to palpation and soft, normoactive bowel sounds. Musculoskeletal: The patient is ambulating with assistance from a cane.   Right Knee Exam:  No effusion present. No swelling present.  Varus deformity.  The range of motion is: 5 to 120 degrees.  Moderate crepitus on range of motion of the knee.  Positive medial greater than lateral joint line  tenderness.  The knee is stable.   Left Knee Exam:  No effusion present. No swelling present.  The Range of motion is: 0 to 120 degrees.  No crepitus on range of motion of the knee.  No medial joint line tenderness.  No lateral joint line tenderness.  The knee is stable.  Calves soft and nontender. Motor function intact in LE. Strength 5/5 LE bilaterally. Neuro: Distal pulses 2+. Sensation to light touch intact in LE.  Vital signs in last 24 hours: BP: ()/()  Arterial Line BP: ()/()   Imaging Review Plain radiographs demonstrate moderate degenerative joint disease of the right knee. The overall alignment is mild varus. The bone quality appears to be adequate for age and reported activity level.  Assessment/Plan:  End stage arthritis, right knee   The patient history, physical examination, clinical judgment of the provider and imaging studies are consistent with end stage degenerative joint disease of the right knee and total knee arthroplasty is deemed medically necessary. The treatment options including medical management, injection therapy arthroscopy and arthroplasty were discussed at length. The risks and benefits of total knee arthroplasty were presented and reviewed.  The risks due to aseptic loosening, infection, stiffness, patella tracking problems, thromboembolic complications and other imponderables were discussed. The patient acknowledged the explanation, agreed to proceed with the plan and consent was signed. Patient is being admitted for inpatient treatment for surgery, pain control, PT, OT, prophylactic antibiotics, VTE prophylaxis, progressive ambulation and ADLs and discharge planning. The patient is planning to be discharged  home .  Patient's anticipated LOS is less than 2 midnights, meeting these requirements: - Lives within 1 hour of care - Has a competent adult at home to recover with post-op - NO history of  - Chronic pain requiring opiods  - Diabetes  - Coronary Artery Disease  - Heart failure  - Heart attack  - Stroke  - DVT/VTE  - Cardiac arrhythmia  - Respiratory Failure/COPD  - Renal failure  - Anemia  - Advanced Liver disease  Therapy Plans: [facility] Disposition: Home with Husband/Children Planned DVT Prophylaxis: Xarelto 10 mg (hx of Crohn's) DME Needed: None PCP: [name], MD (clearance received) Cardiologist: Shelva Majestic, MD (in EPIC from 02/06) TXA: IV Allergies: NKDA Anesthesia Concerns: None BMI: 31.8 Last HgbA1c: not diabetic  Pharmacy: [facility] [address]  - Patient was instructed on what medications to stop prior to surgery. - Follow-up visit in 2 weeks with Dr. Wynelle Link - Begin physical therapy following surgery - Pre-operative lab work as pre-surgical testing - Prescriptions will be provided in hospital at time of discharge  R. Jaynie Bream, PA-C Orthopedic Surgery [facility] [place]

## 2022-12-27 NOTE — Telephone Encounter (Signed)
Patient walked into office with complaints of stomach upset due to medications. Patient states that since she started the Valsartan- Hydrochlorothiazide-  on 2/6 she has been having diarrhea every day. Patient states that this is the only medication change. Patient would like to know if this could be related and if so could she switch to something else. Will forward to PharmD/MD. Patient aware and verbalized understanding.

## 2022-12-28 MED ORDER — AMLODIPINE BESYLATE 5 MG PO TABS: 5.0000 mg | ORAL_TABLET | Freq: Every day | ORAL | 3 refills | Status: DC

## 2022-12-28 MED ORDER — VALSARTAN 160 MG PO TABS: 160.0000 mg | ORAL_TABLET | Freq: Every day | ORAL | 3 refills | Status: DC

## 2022-12-28 NOTE — Telephone Encounter (Signed)
Called patient- she was advised I did not have a response yet in regards to her medication.  She did state that her BP has been good. 118/79, 128/78, but HR has been increased 102, 105. She states she did not take the mixed medication today- only Valsartan 80 mg. Patient does not have any symptoms to report, just concern with her HR numbers.  She would like to know how to proceed.   Thanks!

## 2022-12-28 NOTE — Telephone Encounter (Signed)
Patient is following up due to not hearing from anyone yesterday afternoon. She reiterates that she is still having issues with the medication causing diarrhea. Patient is hopeful for a call back soon. She mentions that will may be unavailable from 1:00-2:30 PM because she has a meeting.

## 2022-12-28 NOTE — Addendum Note (Signed)
Addended by: Anda Latina on: 12/28/2022 02:04 PM   Modules accepted: Orders

## 2022-12-28 NOTE — Telephone Encounter (Signed)
Spoke to the patient. Reports since she started taking valsartan HCTZ 160/12.5 she is experiencing upset stomach and diarrhea. She was ok on valsartan 160 mg. She has not changed her diet or ate outside.  She took her left over valsartan 80 mg  this morning and she does not have any trouble so far.  Will switch her valsartan HCTZ 160 /12.5 daily to valsartan 160 mg daily and add amlodipine 5 mg daily. And Will continue Toprol Xl 100 mg daily   Patient has appointment with Diona Browner, NP in 2 weeks.

## 2022-12-29 ENCOUNTER — Telehealth: Payer: Self-pay | Admitting: Internal Medicine

## 2022-12-29 NOTE — Telephone Encounter (Signed)
Pt stated that she has been having diarrhea for over a week now and was up all night with diarrhea  and requesting for an office visit today. Pt was notified that Dr. Carlean Purl is booked up today. An appointment with a PA was offered for the pt on 01/06/2023 with an APP but declined  Pt is requesting that Dr. Carlean Purl call her. Please advise

## 2022-12-29 NOTE — Telephone Encounter (Signed)
Pt schedule to see Dr. Carlean Purl 1030 am  on Friday, February 16 th: Pt made aware. Pt verbalized understanding with all questions answered.

## 2022-12-29 NOTE — Telephone Encounter (Signed)
I spoke to the patient.  She has been having frequent formed stools since starting valsartan/HCTZ.  Her last dose of that was about a day ago, but she is still having problems.  No bleeding no watery stools no complaints of abdominal pain.  She is concerned because she has an upcoming knee surgery in March and she has not gotten relief from the symptoms yet.  She has Crohn's disease so it could be a flare.   Please schedule her for 1030 on Friday, February 16.  (This will be a double book in that slot) she is advised to continue to hydrate and she may use up to 6 Imodium a day.  She tried some of that the other day but only 1 or 2 pills and of questionable benefit.  I told her if things got severe she would need to go to the emergency department.

## 2022-12-29 NOTE — Telephone Encounter (Signed)
Inbound call from patient stating that she has been having diarrhea all night and is requesting a call back to discuss if she can be seen before April with Dr. Carlean Purl. Patient does not want to see a PA. Please advise.

## 2022-12-31 ENCOUNTER — Ambulatory Visit (INDEPENDENT_AMBULATORY_CARE_PROVIDER_SITE_OTHER): Payer: Medicare Other | Admitting: Internal Medicine

## 2022-12-31 ENCOUNTER — Other Ambulatory Visit: Payer: Medicare Other

## 2022-12-31 ENCOUNTER — Encounter: Payer: Self-pay | Admitting: Internal Medicine

## 2022-12-31 VITALS — BP 112/64 | HR 83 | Ht 63.0 in | Wt 176.0 lb

## 2022-12-31 DIAGNOSIS — R197 Diarrhea, unspecified: Secondary | ICD-10-CM

## 2022-12-31 DIAGNOSIS — Z7960 Long term (current) use of unspecified immunomodulators and immunosuppressants: Secondary | ICD-10-CM

## 2022-12-31 DIAGNOSIS — K5080 Crohn's disease of both small and large intestine without complications: Secondary | ICD-10-CM

## 2022-12-31 NOTE — Patient Instructions (Addendum)
ORAL REHYDRATION SOLUTION RECIPES   Sugar and salt water   ? 1 quart water ?  teaspoon salt ? 6 teaspoons sugar ? Optional: Crystal Light to taste (especially lemonade or orange-pineapple flavors)   Gatorade G2   ? 4 cups Gatorade G2 (or one, 32 ounce bottle) ? 1/2 teaspoon salt   Chicken Broth   ? 4 cups water ? 1 dry chicken broth cube ?  teaspoon salt ? 2 tablespoon sugar OR ? 2 cups liquid broth ? 2 cups water ? 2 tablespoon sugar  Tomato Juice   ? 2  cups tomato juice ? 1  cups water  Homemade Cereal Based  ?  cup dry, precooked baby rice cereal ? 2 cups water ?  teaspoon salt ? Combine ingredients and mix until well dissolved and smooth. Refrigerate. Solution should be thick, but pourable and drinkable.  Try not to take the Imodium over the weekend . Wait until Monday 01/03/2023 to do your stool test. Message Dr Carlean Purl with an update on Monday.   I appreciate the opportunity to care for you. Ronney Lion, [facility]

## 2022-12-31 NOTE — Progress Notes (Addendum)
Jade Hernandez 82 y.o. September 06, 1941 [id number]  Assessment & Plan:   Encounter Diagnoses  Name Primary?   Diarrhea, unspecified type Yes   Crohn's disease of both small and large intestine without complication (HCC)    Long-term use of immunosuppressant medication _ Entyvio     Differential here is medication side effect, IBS or Crohn's flare.  I am going to have her check a fecal calprotectin in a few days if the symptoms persist.  I want her to try to avoid the loperamide at this point.  She may use it if really needed but I want to see how she is doing over time.  If calprotectin is positive treat for Crohn's flareup most likely.  We will keep her April 16 appointment for the time being.  Hopefully she will be able to have her upcoming knee surgery in early March but if we are treating a Crohn's flare that may need to be deferred.  CC: [name], MD    Subjective:   Chief Complaint: Change in bowel habits in the setting of Crohn's disease  HPI 82 year old [place] woman with Crohn's disease of the small and large bowel, on Entyvio with good control of her Crohn's disease for some time now, who had blood pressure medicine changed recently.  She was changed her had valsartan/HCTZ added and she started noting increased frequency of small stools.  Multiple times even overnight.  Sometimes with mucus but no bleeding.  No significant abdominal pain but feels bloated.  Just this week she had the HCTZ component dropped from the valsartan.  She is on metoprolol and amlodipine as well.  Blood pressure is under better control now.  She has an upcoming knee replacement on March 11 and is concerned these bowel habit changes could represent a problem regarding that i.e. also possible Crohn's flare.  She is here with her son Jade Hernandez today. I had spoken to her 2 days ago and recommended she try up to 6 Imodium a day and she tried that and it did slow the bowel frequency. Wt Readings from  Last 3 Encounters:  12/31/22 176 lb (79.8 kg)  12/21/22 173 lb 12.8 oz (78.8 kg)  12/07/22 176 lb 6.4 oz (80 kg)    Allergies  Allergen Reactions   Feraheme [Ferumoxytol] Shortness Of Breath and Palpitations    Severe pain all over   Current Meds  Medication Sig   acetaminophen (TYLENOL) 500 MG tablet Take 1,000 mg by mouth in the morning, at noon, in the evening, and at bedtime.   amLODipine (NORVASC) 5 MG tablet Take 1 tablet (5 mg total) by mouth daily.   metoprolol succinate (TOPROL XL) 100 MG 24 hr tablet Take 1 tablet (100 mg total) by mouth daily. Take with or immediately following a meal.   Probiotic Product (PROBIOTIC DAILY PO) Take 1 capsule by mouth daily.   simvastatin (ZOCOR) 40 MG tablet Take 40 mg by mouth daily.   sodium chloride 0.9 % SOLN 250 mL with vedolizumab 300 MG SOLR 300 mg Inject 300 mg into the vein every 8 (eight) weeks.   traMADol (ULTRAM) 50 MG tablet Take 50-100 mg by mouth 2 (two) times daily as needed.   valsartan (DIOVAN) 160 MG tablet Take 1 tablet (160 mg total) by mouth daily.   Vitamin D, Ergocalciferol, 50 MCG (2000 UT) CAPS Take 1 capsule by mouth daily.   Past Medical History:  Diagnosis Date   Adenomatous colon polyp    Arthritis  Crohn's disease of both small and large intestine with other complication ([place]) 123456   Diverticulosis    High cholesterol    Hypertension    IBS (irritable bowel syndrome)    Palpitations    UTI (urinary tract infection)    Past Surgical History:  Procedure Laterality Date   BILATERAL CARPAL TUNNEL RELEASE     COLONOSCOPY     NM MYOCAR PERF WALL MOTION  10/02/2007   No significant ischemia demonstrated   TOTAL KNEE ARTHROPLASTY Left 02/21/2017   Procedure: LEFT TOTAL KNEE ARTHROPLASTY;  Surgeon: Gaynelle Arabian, MD;  Location: [facility];  Service: Orthopedics;  Laterality: Left;  requests 2mns; Adductor Block   UKoreaECHOCARDIOGRAPHY  10/29/2010   Proximal septal thickening noted, EF =>55%,LA mildly  dilated,mild mitral annular ca+, AOV mildly sclerotic, mild AI.   Social History   Social History Narrative   The patient is married she [place]  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 [employer]  She does have children 2 daughters 2 sons multiple grandchildren   Rare alcohol no tobacco or drug use   family history includes Diabetes in her mother; Heart disease in her brother and father; Hypertension in her mother.   Review of Systems  As above Objective:   Physical Exam BP 112/64   Pulse 83   Ht 5' 3"$  (1.6 m)   Wt 176 lb (79.8 kg)   BMI 31.18 kg/m  Woozy w/ quick rise - transient and recovers quickly Abdomen is soft nontender without organomegaly or mass bowel sounds present  Data reviewed I have reviewed recent cardiology notes regarding the blood pressure medicine change and the phone calls involved

## 2023-01-07 NOTE — Progress Notes (Signed)
Anesthesia Review:  PCP: Cardiologist : DR Corky Downs Lov 12/21/22  Chest x-ray : 04/27/22- 1 view  EKG : 12/21/22  Echo : Stress test: Cardiac Cath :  Activity level:  Sleep Study/ CPAP : Fasting Blood Sugar :      / Checks Blood Sugar -- times a day:   Blood Thinner/ Instructions /Last Dose: ASA / Instructions/ Last Dose :

## 2023-01-09 DIAGNOSIS — R059 Cough, unspecified: Secondary | ICD-10-CM | POA: Diagnosis not present

## 2023-01-09 DIAGNOSIS — U071 COVID-19: Secondary | ICD-10-CM | POA: Diagnosis not present

## 2023-01-09 DIAGNOSIS — R509 Fever, unspecified: Secondary | ICD-10-CM | POA: Diagnosis not present

## 2023-01-09 DIAGNOSIS — R5383 Other fatigue: Secondary | ICD-10-CM | POA: Diagnosis not present

## 2023-01-10 NOTE — Patient Instructions (Signed)
SURGICAL WAITING ROOM VISITATION  Patients having surgery or a procedure may have no more than 2 support people in the waiting area - these visitors may rotate.    Children under the age of 50 must have an adult with them who is not the patient.  Due to an increase in RSV and influenza rates and associated hospitalizations, children ages 63 and under may not visit patients in [place].  If the patient needs to stay at the hospital during part of their recovery, the visitor guidelines for inpatient rooms apply. Pre-op nurse will coordinate an appropriate time for 1 support person to accompany patient in pre-op.  This support person may not rotate.    Please refer to the [facility] website for the visitor guidelines for Inpatients (after your surgery is over and you are in a regular room).       Your procedure is scheduled on:  01/24/23    Report to [facility] Main Entrance    Report to admitting at  1115AM   Call this number if you have problems the morning of surgery [phone]   Do not eat food :After Midnight.   After Midnight you may have the following liquids until _ 1045_____ AM DAY OF SURGERY  Water Non-Citrus Juices (without pulp, NO RED-Apple, White grape, White cranberry) Black Coffee (NO MILK/CREAM OR CREAMERS, sugar ok)  Clear Tea (NO MILK/CREAM OR CREAMERS, sugar ok) regular and decaf                             Plain Jell-O (NO RED)                                           Fruit ices (not with fruit pulp, NO RED)                                     Popsicles (NO RED)                                                               Sports drinks like Gatorade (NO RED)                    The day of surgery:  Drink ONE (1) Pre-Surgery Clear Ensure or G2 at   1045AM  9 have completed by ) the morning of surgery. Drink in one sitting. Do not sip.  This drink was given to you during your hospital  pre-op appointment visit. Nothing else to drink  after completing the  Pre-Surgery Clear Ensure or G2.          If you have questions, please contact your surgeon's office.       Oral Hygiene is also important to reduce your risk of infection.                                    Remember - BRUSH YOUR TEETH THE MORNING OF SURGERY WITH YOUR  REGULAR TOOTHPASTE  DENTURES WILL BE REMOVED PRIOR TO SURGERY PLEASE DO NOT APPLY "Poly grip" OR ADHESIVES!!!   Do NOT smoke after Midnight   Take these medicines the morning of surgery with A SIP OF WATER:  amlodipoine, toprol   DO NOT TAKE ANY ORAL DIABETIC MEDICATIONS DAY OF YOUR SURGERY  Bring CPAP mask and tubing day of surgery.                              You may not have any metal on your body including hair pins, jewelry, and body piercing             Do not wear make-up, lotions, powders, perfumes/cologne, or deodorant  Do not wear nail polish including gel and S&S, artificial/acrylic nails, or any other type of covering on natural nails including finger and toenails. If you have artificial nails, gel coating, etc. that needs to be removed by a nail salon please have this removed prior to surgery or surgery may need to be canceled/ delayed if the surgeon/ anesthesia feels like they are unable to be safely monitored.   Do not shave  48 hours prior to surgery.               Men may shave face and neck.   Do not bring valuables to the hospital. [place].   Contacts, glasses, dentures or bridgework may not be worn into surgery.   Bring small overnight bag day of surgery.   DO NOT [place]. PHARMACY WILL DISPENSE MEDICATIONS LISTED ON YOUR MEDICATION LIST TO YOU DURING YOUR ADMISSION [place]!    Patients discharged on the day of surgery will not be allowed to drive home.  Someone NEEDS to stay with you for the first 24 hours after anesthesia.   Special Instructions: Bring a copy of your  healthcare power of attorney and living will documents the day of surgery if you haven't scanned them before.              Please read over the following fact sheets you were given: IF [place] [id number]   If you received a COVID test during your pre-op visit  it is requested that you wear a mask when out in public, stay away from anyone that may not be feeling well and notify your surgeon if you develop symptoms. If you test positive for Covid or have been in contact with anyone that has tested positive in the last 10 days please notify you surgeon.    [place] - Preparing for Surgery Before surgery, you can play an important role.  Because skin is not sterile, your skin needs to be as free of germs as possible.  You can reduce the number of germs on your skin by washing with CHG (chlorahexidine gluconate) soap before surgery.  CHG is an antiseptic cleaner which kills germs and bonds with the skin to continue killing germs even after washing. Please DO NOT use if you have an allergy to CHG or antibacterial soaps.  If your skin becomes reddened/irritated stop using the CHG and inform your nurse when you arrive at Short Stay. Do not shave (including legs and underarms) for at least 48 hours prior to the first CHG shower.  You may shave your face/neck. Please follow these instructions carefully:  1.  Shower with CHG Soap the night before surgery and the  morning of Surgery.  2.  If you choose to wash your hair, wash your hair first as usual with your  normal  shampoo.  3.  After you shampoo, rinse your hair and body thoroughly to remove the  shampoo.                           4.  Use CHG as you would any other liquid soap.  You can apply chg directly  to the skin and wash                       Gently with a scrungie or clean washcloth.  5.  Apply the CHG Soap to your body ONLY FROM THE NECK DOWN.   Do not use on face/ open                            Wound or open sores. Avoid contact with eyes, ears mouth and genitals (private parts).                       Wash face,  Genitals (private parts) with your normal soap.             6.  Wash thoroughly, paying special attention to the area where your surgery  will be performed.  7.  Thoroughly rinse your body with warm water from the neck down.  8.  DO NOT shower/wash with your normal soap after using and rinsing off  the CHG Soap.                9.  Pat yourself dry with a clean towel.            10.  Wear clean pajamas.            11.  Place clean sheets on your bed the night of your first shower and do not  sleep with pets. Day of Surgery : Do not apply any lotions/deodorants the morning of surgery.  Please wear clean clothes to the hospital/surgery center.  FAILURE TO FOLLOW THESE INSTRUCTIONS MAY RESULT IN THE CANCELLATION OF YOUR SURGERY PATIENT SIGNATURE_________________________________  NURSE SIGNATURE__________________________________  ________________________________________________________________________

## 2023-01-11 ENCOUNTER — Encounter (HOSPITAL_COMMUNITY)
Admission: RE | Admit: 2023-01-11 | Discharge: 2023-01-11 | Disposition: A | Discharge status: Home / Self Care | Payer: Medicare Other | Source: Ambulatory Visit | Attending: Family Medicine | Admitting: Family Medicine

## 2023-01-13 ENCOUNTER — Other Ambulatory Visit (HOSPITAL_COMMUNITY): Payer: Medicare Other

## 2023-01-13 NOTE — Patient Instructions (Signed)
SURGICAL WAITING ROOM VISITATION  Patients having surgery or a procedure may have no more than 2 support people in the waiting area - these visitors may rotate.    Children under the age of 32 must have an adult with them who is not the patient.  Due to an increase in RSV and influenza rates and associated hospitalizations, children ages 65 and under may not visit patients in [place].  If the patient needs to stay at the hospital during part of their recovery, the visitor guidelines for inpatient rooms apply. Pre-op nurse will coordinate an appropriate time for 1 support person to accompany patient in pre-op.  This support person may not rotate.    Please refer to the [facility] website for the visitor guidelines for Inpatients (after your surgery is over and you are in a regular room).    Your procedure is scheduled on: 01/24/23   Report to [facility] Main Entrance    Report to admitting at  AM   Call this number if you have problems the morning of surgery [phone]   Do not eat food :After Midnight.   After Midnight you may have the following liquids until ______ AM/ PM DAY OF SURGERY  Water Non-Citrus Juices (without pulp, NO RED-Apple, White grape, White cranberry) Black Coffee (NO MILK/CREAM OR CREAMERS, sugar ok)  Clear Tea (NO MILK/CREAM OR CREAMERS, sugar ok) regular and decaf                             Plain Jell-O (NO RED)                                           Fruit ices (not with fruit pulp, NO RED)                                     Popsicles (NO RED)                                                               Sports drinks like Gatorade (NO RED)               The day of surgery:  Drink ONE (1) Pre-Surgery Clear Ensure or G2 at AM the morning of surgery. Drink in one sitting. Do not sip.  This drink was given to you during your hospital  pre-op appointment visit. Nothing else to drink after completing the  Pre-Surgery Clear Ensure  or G2.          If you have questions, please contact your surgeon's office.   FOLLOW BOWEL PREP AND ANY ADDITIONAL PRE OP INSTRUCTIONS YOU RECEIVED FROM YOUR SURGEON'S OFFICE!!!     Oral Hygiene is also important to reduce your risk of infection.                                    Remember - BRUSH YOUR TEETH THE MORNING OF SURGERY WITH YOUR REGULAR TOOTHPASTE  DENTURES WILL BE REMOVED PRIOR TO SURGERY PLEASE DO NOT APPLY "Poly grip" OR ADHESIVES!!!   Do NOT smoke after Midnight   Take these medicines the morning of surgery with A SIP OF WATER: Tylenol, Amlodipine, Meclizine, Metoprolol, Simvastatin, Tramadol  DO NOT TAKE ANY ORAL DIABETIC MEDICATIONS DAY OF YOUR SURGERY  How to Manage Your Diabetes Before and After Surgery  Why is it important to control my blood sugar before and after surgery? Improving blood sugar levels before and after surgery helps healing and can limit problems. A way of improving blood sugar control is eating a healthy diet by:  Eating less sugar and carbohydrates  Increasing activity/exercise  Talking with your doctor about reaching your blood sugar goals High blood sugars (greater than 180 mg/dL) can raise your risk of infections and slow your recovery, so you will need to focus on controlling your diabetes during the weeks before surgery. Make sure that the doctor who takes care of your diabetes knows about your planned surgery including the date and location.  How do I manage my blood sugar before surgery? Check your blood sugar at least 4 times a day, starting 2 days before surgery, to make sure that the level is not too high or low. Check your blood sugar the morning of your surgery when you wake up and every 2 hours until you get to the Short Stay unit. If your blood sugar is less than 70 mg/dL, you will need to treat for low blood sugar: Do not take insulin. Treat a low blood sugar (less than 70 mg/dL) with  cup of clear juice (cranberry or  apple), 4 glucose tablets, OR glucose gel. Recheck blood sugar in 15 minutes after treatment (to make sure it is greater than 70 mg/dL). If your blood sugar is not greater than 70 mg/dL on recheck, call [phone] for further instructions. Report your blood sugar to the short stay nurse when you get to Short Stay.  If you are admitted to the hospital after surgery: Your blood sugar will be checked by the staff and you will probably be given insulin after surgery (instead of oral diabetes medicines) to make sure you have good blood sugar levels. The goal for blood sugar control after surgery is 80-180 mg/dL.   WHAT DO I DO ABOUT MY DIABETES MEDICATION?  Do not take oral diabetes medicines (pills) the morning of surgery.  THE NIGHT BEFORE SURGERY, take     units of       insulin.       THE MORNING OF SURGERY, take   units of         insulin.  Reviewed and Endorsed by [facility] Patient Education Committee, August 2015  Bring CPAP mask and tubing day of surgery.                              You may not have any metal on your body including hair pins, jewelry, and body piercing             Do not wear make-up, lotions, powders, perfumes, or deodorant  Do not wear nail polish including gel and S&S, artificial/acrylic nails, or any other type of covering on natural nails including finger and toenails. If you have artificial nails, gel coating, etc. that needs to be removed by a nail salon please have this removed prior to surgery or surgery may need to be canceled/ delayed if the  surgeon/ anesthesia feels like they are unable to be safely monitored.   Do not shave  48 hours prior to surgery.    Do not bring valuables to the hospital. [place].   Contacts, glasses, dentures or bridgework may not be worn into surgery.   Bring small overnight bag day of surgery.   DO NOT [place]. PHARMACY WILL  DISPENSE MEDICATIONS LISTED ON YOUR MEDICATION LIST TO YOU DURING YOUR ADMISSION [place]!   Special Instructions: Bring a copy of your healthcare power of attorney and living will documents the day of surgery if you haven't scanned them before.              Please read over the following fact sheets you were given: IF [place] [phone] Schneiders    If you received a COVID test during your pre-op visit  it is requested that you wear a mask when out in public, stay away from anyone that may not be feeling well and notify your surgeon if you develop symptoms. If you test positive for Covid or have been in contact with anyone that has tested positive in the last 10 days please notify you surgeon.    [place] - Preparing for Surgery Before surgery, you can play an important role.  Because skin is not sterile, your skin needs to be as free of germs as possible.  You can reduce the number of germs on your skin by washing with CHG (chlorahexidine gluconate) soap before surgery.  CHG is an antiseptic cleaner which kills germs and bonds with the skin to continue killing germs even after washing. Please DO NOT use if you have an allergy to CHG or antibacterial soaps.  If your skin becomes reddened/irritated stop using the CHG and inform your nurse when you arrive at Short Stay. Do not shave (including legs and underarms) for at least 48 hours prior to the first CHG shower.  You may shave your face/neck.  Please follow these instructions carefully:  1.  Shower with CHG Soap the night before surgery and the  morning of surgery.  2.  If you choose to wash your hair, wash your hair first as usual with your normal  shampoo.  3.  After you shampoo, rinse your hair and body thoroughly to remove the shampoo.                             4.  Use CHG as you would any other liquid soap.  You can apply chg directly to the skin and wash.  Gently with a  scrungie or clean washcloth.  5.  Apply the CHG Soap to your body ONLY FROM THE NECK DOWN.   Do   not use on face/ open                           Wound or open sores. Avoid contact with eyes, ears mouth and   genitals (private parts).                       Wash face,  Genitals (private parts) with your normal soap.             6.  Wash thoroughly, paying special attention to the area where your    surgery  will be performed.  7.  Thoroughly rinse your body with warm water from the neck down.  8.  DO NOT shower/wash with your normal soap after using and rinsing off the CHG Soap.                9.  Pat yourself dry with a clean towel.            10.  Wear clean pajamas.            11.  Place clean sheets on your bed the night of your first shower and do not  sleep with pets. Day of Surgery : Do not apply any lotions/deodorants the morning of surgery.  Please wear clean clothes to the hospital/surgery center.  FAILURE TO FOLLOW THESE INSTRUCTIONS MAY RESULT IN THE CANCELLATION OF YOUR SURGERY  PATIENT SIGNATURE_________________________________  NURSE SIGNATURE__________________________________  ________________________________________________________________________  Adam Phenix  An incentive spirometer is a tool that can help keep your lungs clear and active. This tool measures how well you are filling your lungs with each breath. Taking long deep breaths may help reverse or decrease the chance of developing breathing (pulmonary) problems (especially infection) following: A long period of time when you are unable to move or be active. BEFORE THE PROCEDURE  If the spirometer includes an indicator to show your best effort, your nurse or respiratory therapist will set it to a desired goal. If possible, sit up straight or lean slightly forward. Try not to slouch. Hold the incentive spirometer in an upright position. INSTRUCTIONS FOR USE  Sit on the edge of your bed if possible, or  sit up as far as you can in bed or on a chair. Hold the incentive spirometer in an upright position. Breathe out normally. Place the mouthpiece in your mouth and seal your lips tightly around it. Breathe in slowly and as deeply as possible, raising the piston or the ball toward the top of the column. Hold your breath for 3-5 seconds or for as long as possible. Allow the piston or ball to fall to the bottom of the column. Remove the mouthpiece from your mouth and breathe out normally. Rest for a few seconds and repeat Steps 1 through 7 at least 10 times every 1-2 hours when you are awake. Take your time and take a few normal breaths between deep breaths. The spirometer may include an indicator to show your best effort. Use the indicator as a goal to work toward during each repetition. After each set of 10 deep breaths, practice coughing to be sure your lungs are clear. If you have an incision (the cut made at the time of surgery), support your incision when coughing by placing a pillow or rolled up towels firmly against it. Once you are able to get out of bed, walk around indoors and cough well. You may stop using the incentive spirometer when instructed by your caregiver.  RISKS AND COMPLICATIONS Take your time so you do not get dizzy or light-headed. If you are in pain, you may need to take or ask for pain medication before doing incentive spirometry. It is harder to take a deep breath if you are having pain. AFTER USE Rest and breathe slowly and easily. It can be helpful to keep track of a log of your progress. Your caregiver can provide you with a simple table to help with this. If you are using  the spirometer at home, follow these instructions: [place] IF:  You are having difficultly using the spirometer. You have trouble using the spirometer as often as instructed. Your pain medication is not giving enough relief while using the spirometer. You develop fever of 100.5 F (38.1 C)  or higher. SEEK IMMEDIATE MEDICAL CARE IF:  You cough up bloody sputum that had not been present before. You develop fever of 102 F (38.9 C) or greater. You develop worsening pain at or near the incision site. MAKE SURE YOU:  Understand these instructions. Will watch your condition. Will get help right away if you are not doing well or get worse. Document Released: 03/14/2007 Document Revised: 01/24/2012 Document Reviewed: 05/15/2007 [facility] Patient Information 2014 [place], [place].   ________________________________________________________________________

## 2023-01-13 NOTE — Progress Notes (Signed)
COVID Vaccine Completed: yes  Date of COVID positive in last 90 days:  PCP - [name], MD Cardiologist - Shelva Majestic, MD Gastroenterologist- Silvano Rusk, MD  Chest x-ray - 04/27/22 Epic EKG - 12/21/22 Epic Stress Test - 2008 ECHO - scheduled 02/07/23, last 2021 Cardiac Cath -  Pacemaker/ICD device last checked: Spinal Cord Stimulator:  Bowel Prep -   Sleep Study -  CPAP -   Fasting Blood Sugar - preDM Checks Blood Sugar _____ times a day  Last dose of GLP1 agonist-  N/A GLP1 instructions:  N/A   Last dose of SGLT-2 inhibitors-  N/A SGLT-2 instructions: N/A   Blood Thinner Instructions: Aspirin Instructions: Last Dose:  Activity level:  Can go up a flight of stairs and perform activities of daily living without stopping and without symptoms of chest pain or shortness of breath.  Able to exercise without symptoms  Unable to go up a flight of stairs without symptoms of     Anesthesia review: needs ECHO for clearance, crohns flare up, HTN, palpitations  Patient denies shortness of breath, fever, cough and chest pain at PAT appointment  Patient verbalized understanding of instructions that were given to them at the PAT appointment. Patient was also instructed that they will need to review over the PAT instructions again at home before surgery.

## 2023-01-14 ENCOUNTER — Ambulatory Visit: Payer: Medicare Other | Admitting: Nurse Practitioner

## 2023-01-14 ENCOUNTER — Encounter (HOSPITAL_COMMUNITY)
Admission: RE | Admit: 2023-01-14 | Discharge: 2023-01-14 | Disposition: A | Discharge status: Home / Self Care | Payer: Medicare Other | Source: Ambulatory Visit | Attending: Anesthesiology | Admitting: Anesthesiology

## 2023-01-14 DIAGNOSIS — Z01818 Encounter for other preprocedural examination: Secondary | ICD-10-CM

## 2023-01-14 DIAGNOSIS — R7303 Prediabetes: Secondary | ICD-10-CM

## 2023-01-14 DIAGNOSIS — I10 Essential (primary) hypertension: Secondary | ICD-10-CM

## 2023-01-24 ENCOUNTER — Ambulatory Visit: Admit: 2023-01-24 | Payer: Medicare Other | Admitting: Orthopedic Surgery

## 2023-01-24 SURGERY — ARTHROPLASTY, KNEE, TOTAL
Anesthesia: Choice | Site: Knee | Laterality: Right

## 2023-01-27 ENCOUNTER — Ambulatory Visit: Payer: Medicare Other | Admitting: Physical Therapy

## 2023-02-04 ENCOUNTER — Encounter: Payer: Medicare Other | Admitting: Physical Therapy

## 2023-02-07 ENCOUNTER — Encounter: Payer: Medicare Other | Admitting: Physical Therapy

## 2023-02-07 ENCOUNTER — Ambulatory Visit (HOSPITAL_COMMUNITY): Payer: Medicare Other | Attending: Cardiology

## 2023-02-07 DIAGNOSIS — I10 Essential (primary) hypertension: Secondary | ICD-10-CM | POA: Diagnosis not present

## 2023-02-07 DIAGNOSIS — E7800 Pure hypercholesterolemia, unspecified: Secondary | ICD-10-CM | POA: Diagnosis not present

## 2023-02-08 ENCOUNTER — Ambulatory Visit (INDEPENDENT_AMBULATORY_CARE_PROVIDER_SITE_OTHER): Payer: Medicare Other

## 2023-02-08 VITALS — BP 135/70 | HR 59 | Temp 98.2°F | Resp 16 | Ht 63.0 in | Wt 173.0 lb

## 2023-02-08 DIAGNOSIS — K50818 Crohn's disease of both small and large intestine with other complication: Secondary | ICD-10-CM | POA: Diagnosis not present

## 2023-02-08 LAB — ECHOCARDIOGRAM COMPLETE
Area-P 1/2: 7.59 cm2
P 1/2 time: 391 msec
S' Lateral: 2.6 cm

## 2023-02-08 MED ORDER — VEDOLIZUMAB 300 MG IV SOLR
300.0000 mg | Freq: Once | INTRAVENOUS | Status: AC
  Administered 2023-02-08: 300 mg via INTRAVENOUS
  Filled 2023-02-08: qty 5

## 2023-02-08 NOTE — Progress Notes (Signed)
Diagnosis: Crohn's Disease  Provider:  Marshell Garfinkel MD  Procedure: Infusion  IV Type: Peripheral, IV Location: L Forearm  Entyvio (Vedolizumab), Dose: 300 mg  Infusion Start Time: 0923  Infusion Stop Time: [id number]  Post Infusion IV Care: Peripheral IV Discontinued  Discharge: Condition: Good, Destination: Home . AVS Provided  Performed by:  Adelina Mings, LPN

## 2023-02-09 ENCOUNTER — Encounter: Payer: Medicare Other | Admitting: Physical Therapy

## 2023-02-14 NOTE — H&P (Signed)
TOTAL KNEE ADMISSION H&P  Patient is being admitted for right total knee arthroplasty.  Subjective:  Chief Complaint: Right knee pain.  HPI: Jade Hernandez, 82 y.o. female has a history of pain and functional disability in the right knee due to arthritis and has failed non-surgical conservative treatments for greater than 12 weeks to include NSAID's and/or analgesics, corticosteriod injections, use of assistive devices, and activity modification. Onset of symptoms was gradual, starting several years ago with gradually worsening course since that time. The patient noted no past surgery on the right knee.  Patient currently rates pain in the right knee at 7 out of 10 with activity. Patient has night pain, worsening of pain with activity and weight bearing, and pain that interferes with activities of daily living. Patient has evidence of  bone-on-bone arthritis in the medial and patellofemoral compartments of the right knee with varus deformity  by imaging studies. There is no active infection.   Patient Active Problem List   Diagnosis Date Noted   Long-term use of immunosuppressant medication _ Entyvio 02/16/2021   Iron deficiency anemia due to chronic blood loss 11/25/2020   Crohn's disease of both small and large intestine with other complication [id number]   OA (osteoarthritis) of knee 02/21/2017   History of palpitations 04/22/2015   Hyperlipidemia 01/11/2014   GERD (gastroesophageal reflux disease) 01/11/2014   Family history of coronary artery disease 01/11/2014   Osteoarthritis of knee 06/17/2012   Hypertension     Past Medical History:  Diagnosis Date   Adenomatous colon polyp    Arthritis    Crohn's disease of both small and large intestine with other complication ([place]) 123456   Diverticulosis    High cholesterol    Hypertension    IBS (irritable bowel syndrome)    Palpitations    UTI (urinary tract infection)     Past Surgical History:  Procedure Laterality Date    BILATERAL CARPAL TUNNEL RELEASE     COLONOSCOPY     NM MYOCAR PERF WALL MOTION  10/02/2007   No significant ischemia demonstrated   TOTAL KNEE ARTHROPLASTY Left 02/21/2017   Procedure: LEFT TOTAL KNEE ARTHROPLASTY;  Surgeon: Gaynelle Arabian, MD;  Location: [facility];  Service: Orthopedics;  Laterality: Left;  requests 87mins; Adductor Block   US ECHOCARDIOGRAPHY  10/29/2010   Proximal septal thickening noted, EF =>55%,LA mildly dilated,mild mitral annular ca+, AOV mildly sclerotic, mild AI.    Prior to Admission medications   Medication Sig Start Date End Date Taking? Authorizing Provider  acetaminophen (TYLENOL) 500 MG tablet Take 1,000 mg by mouth every 8 (eight) hours as needed for moderate pain.    [provider]  amLODipine (NORVASC) 5 MG tablet Take 1 tablet (5 mg total) by mouth daily. 12/28/22   Troy Sine, MD  meclizine (ANTIVERT) 25 MG tablet Take 25 mg by mouth 3 (three) times daily as needed for dizziness.    [provider]  metoprolol succinate (TOPROL XL) 100 MG 24 hr tablet Take 1 tablet (100 mg total) by mouth daily. Take with or immediately following a meal. 05/04/22   Monge, Helane Gunther, NP  Probiotic Product (PROBIOTIC DAILY PO) Take 1 capsule by mouth daily.    [provider]  simvastatin (ZOCOR) 40 MG tablet Take 40 mg by mouth daily.    [provider]  sodium chloride 0.9 % SOLN 250 mL with vedolizumab 300 MG SOLR 300 mg Inject 300 mg into the vein every 8 (eight) weeks. 11/20/21  Gatha Mayer, MD  traMADol (ULTRAM) 50 MG tablet Take 50 mg by mouth in the morning.    [provider]  valsartan (DIOVAN) 160 MG tablet Take 1 tablet (160 mg total) by mouth daily. 12/28/22   Troy Sine, MD  Vitamin D, Ergocalciferol, 50 MCG (2000 UT) CAPS Take 2,000 Units by mouth daily.    [provider]    Allergies  Allergen Reactions   Feraheme [Ferumoxytol] Shortness Of Breath and Palpitations    Severe pain all over     Social History   Socioeconomic History   Marital status: Married    Spouse name: Not on file   Number of children: 4   Years of education: Not on file   Highest education level: Not on file  Occupational History   Occupation: retired  Tobacco Use   Smoking status: Never   Smokeless tobacco: Never  Vaping Use   Vaping Use: Never used  Substance and Sexual Activity   Alcohol use: Yes    Alcohol/week: 1.0 standard drink of alcohol    Types: 1 Glasses of wine per week    Comment: occasional   Drug use: No   Sexual activity: Not on file  Other Topics Concern   Not on file  Social History Narrative   The patient is married she is originally from [place]   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 [employer]   She does have children 2 daughters 2 sons multiple grandchildren   Rare alcohol no tobacco or drug use   Social Determinants of Radio broadcast assistant Strain: Not on file  Food Insecurity: Not on file  Transportation Needs: Not on file  Physical Activity: Not on file  Stress: Not on file  Social Connections: Not on file  Intimate Partner Violence: Not on file    Tobacco Use: Low Risk  (12/31/2022)   Patient History    Smoking Tobacco Use: Never    Smokeless Tobacco Use: Never    Passive Exposure: Not on file   Social History   Substance and Sexual Activity  Alcohol Use Yes   Alcohol/week: 1.0 standard drink of alcohol   Types: 1 Glasses of wine per week   Comment: occasional    Family History  Problem Relation Age of Onset   Hypertension Mother    Diabetes Mother    Heart disease Father        died from heart attack   Heart disease Brother    Colon cancer Neg Hx    Rectal cancer Neg Hx    Liver cancer Neg Hx    Stomach cancer Neg Hx     Review of Systems  Constitutional:  Negative for chills and fever.  HENT: Negative.    Eyes: Negative.   Respiratory:  Negative for cough and shortness of breath.   Cardiovascular:  Negative for chest pain and  palpitations.  Gastrointestinal:  Negative for abdominal pain, constipation, diarrhea, nausea and vomiting.  Genitourinary:  Negative for dysuria, frequency and urgency.  Musculoskeletal:  Positive for joint pain.  Skin:  Negative for rash.  Objective:  Physical Exam: Well nourished and well developed.  General: Alert and oriented x3, cooperative and pleasant, no acute distress.  Head: normocephalic, atraumatic, neck supple.  Eyes: EOMI. Abdomen: non-tender to palpation and soft, normoactive bowel sounds. Musculoskeletal: The patient is ambulating with assistance from a cane.   Right Knee Exam:  No effusion present. No swelling present.  Varus deformity.  The range of  motion is: 5 to 120 degrees.  Moderate crepitus on range of motion of the knee.  Positive medial greater than lateral joint line tenderness.  The knee is stable.   Left Knee Exam:  No effusion present. No swelling present.  The Range of motion is: 0 to 120 degrees.  No crepitus on range of motion of the knee.  No medial joint line tenderness.  No lateral joint line tenderness.  The knee is stable.  Calves soft and nontender. Motor function intact in LE. Strength 5/5 LE bilaterally. Neuro: Distal pulses 2+. Sensation to light touch intact in LE.   Vital signs in last 24 hours: BP: ()/()  Arterial Line BP: ()/()   Imaging Review Plain radiographs demonstrate moderate degenerative joint disease of the right knee. The overall alignment is mild varus. The bone quality appears to be adequate for age and reported activity level.  Assessment/Plan:  End stage arthritis, right knee   The patient history, physical examination, clinical judgment of the provider and imaging studies are consistent with end stage degenerative joint disease of the right knee and total knee arthroplasty is deemed medically necessary. The treatment options including medical management, injection therapy arthroscopy and arthroplasty were  discussed at length. The risks and benefits of total knee arthroplasty were presented and reviewed. The risks due to aseptic loosening, infection, stiffness, patella tracking problems, thromboembolic complications and other imponderables were discussed. The patient acknowledged the explanation, agreed to proceed with the plan and consent was signed. Patient is being admitted for inpatient treatment for surgery, pain control, PT, OT, prophylactic antibiotics, VTE prophylaxis, progressive ambulation and ADLs and discharge planning. The patient is planning to be discharged  home .  Patient's anticipated LOS is less than 2 midnights, meeting these requirements: - Lives within 1 hour of care - Has a competent adult at home to recover with post-op - NO history of  - Chronic pain requiring opiods  - Diabetes  - Coronary Artery Disease  - Heart failure  - Heart attack  - Stroke  - DVT/VTE  - Cardiac arrhythmia  - Respiratory Failure/COPD  - Renal failure  - Anemia  - Advanced Liver disease  Therapy Plans: Jade Hernandez Disposition: Home with Husband/Children Planned DVT Prophylaxis: Xarelto 10 mg (hx of Crohn's) DME Needed: None PCP: [name], MD (clearance received) Cardiologist: Shelva Majestic, MD (in EPIC from 02/06) TXA: IV Allergies: NKDA Anesthesia Concerns: None BMI: 31.9 Last HgbA1c: not diabetic  Pharmacy: [facility] [address]  Other: -Prefers 2 nights in hospital  - Patient was instructed on what medications to stop prior to surgery. - Follow-up visit in 2 weeks with Dr. Wynelle Link - Begin physical therapy following surgery - Pre-operative lab work as pre-surgical testing - Prescriptions will be provided in hospital at time of discharge  R. Jaynie Bream, PA-C Orthopedic Surgery [facility] [place]

## 2023-02-17 ENCOUNTER — Encounter: Payer: Self-pay | Admitting: Nurse Practitioner

## 2023-02-17 ENCOUNTER — Ambulatory Visit: Payer: Medicare Other | Attending: Nurse Practitioner | Admitting: Nurse Practitioner

## 2023-02-17 VITALS — BP 124/76 | HR 69 | Ht 63.0 in | Wt 173.4 lb

## 2023-02-17 DIAGNOSIS — Z01810 Encounter for preprocedural cardiovascular examination: Secondary | ICD-10-CM | POA: Diagnosis not present

## 2023-02-17 DIAGNOSIS — E782 Mixed hyperlipidemia: Secondary | ICD-10-CM | POA: Diagnosis not present

## 2023-02-17 DIAGNOSIS — K50818 Crohn's disease of both small and large intestine with other complication: Secondary | ICD-10-CM

## 2023-02-17 DIAGNOSIS — I10 Essential (primary) hypertension: Secondary | ICD-10-CM | POA: Diagnosis not present

## 2023-02-17 MED ORDER — AMLODIPINE BESYLATE 2.5 MG PO TABS: 2.5000 mg | ORAL_TABLET | Freq: Every day | ORAL | 3 refills | Status: DC

## 2023-02-17 NOTE — Patient Instructions (Signed)
Medication Instructions:  Decrease Amlodipine 2.5 mg daily.  *If you need a refill on your cardiac medications before your next appointment, please call your pharmacy*   Lab Work: NONE ordered at this time of appointment   If you have labs (blood work) drawn today and your tests are completely normal, you will receive your results only by: [place] (if you have MyChart) OR A paper copy in the mail If you have any lab test that is abnormal or we need to change your treatment, we will call you to review the results.   Testing/Procedures: NONE ordered at this time of appointment     Follow-Up: At [facility], you and your health needs are our priority.  As part of our continuing mission to provide you with exceptional heart care, we have created designated Provider Care Teams.  These Care Teams include your primary Cardiologist (physician) and Advanced Practice Providers (APPs -  Physician Assistants and Nurse Practitioners) who all work together to provide you with the care you need, when you need it.  We recommend signing up for the patient portal called "MyChart".  Sign up information is provided on this After Visit Summary.  MyChart is used to connect with patients for Virtual Visits (Telemedicine).  Patients are able to view lab/test results, encounter notes, upcoming appointments, etc.  Non-urgent messages can be sent to your provider as well.   To learn more about what you can do with MyChart, go to [email or web address].    Your next appointment:   3-4 month(s)  Provider:   Diona Browner, NP        Other Instructions Monitor blood pressure. Goal BP is 130/80.

## 2023-02-17 NOTE — Progress Notes (Signed)
Office Visit    Patient Name: Jade Hernandez Date of Encounter: 02/17/2023  Primary Care Provider:  [name], MD Primary Cardiologist:  Shelva Majestic, MD  Chief Complaint    82 year old female with a history of hypertension, hyperlipidemia, and Crohn's disease who presents for follow-up related to hypertension and for preoperative cardiac evaluation.    Past Medical History    Past Medical History:  Diagnosis Date   Adenomatous colon polyp    Arthritis    Crohn's disease of both small and large intestine with other complication 123456   Diverticulosis    High cholesterol    Hypertension    IBS (irritable bowel syndrome)    Palpitations    UTI (urinary tract infection)    Past Surgical History:  Procedure Laterality Date   BILATERAL CARPAL TUNNEL RELEASE     COLONOSCOPY     NM MYOCAR PERF WALL MOTION  10/02/2007   No significant ischemia demonstrated   TOTAL KNEE ARTHROPLASTY Left 02/21/2017   Procedure: LEFT TOTAL KNEE ARTHROPLASTY;  Surgeon: Gaynelle Arabian, MD;  Location: [facility];  Service: Orthopedics;  Laterality: Left;  requests 65mins; Adductor Block   US ECHOCARDIOGRAPHY  10/29/2010   Proximal septal thickening noted, EF =>55%,LA mildly dilated,mild mitral annular ca+, AOV mildly sclerotic, mild AI.    Allergies  Allergies  Allergen Reactions   Feraheme [Ferumoxytol] Shortness Of Breath and Palpitations    Severe pain all over     Labs/Other Studies Reviewed    The following studies were reviewed today: Echo 01/2023: IMPRESSIONS    1. Left ventricular ejection fraction, by estimation, is 60 to 65%. The  left ventricle has normal function. The left ventricle has no regional  wall motion abnormalities. There is mild left ventricular hypertrophy.  Left ventricular diastolic parameters  were normal.   2. Right ventricular systolic function is normal. The right ventricular  size is normal. Tricuspid regurgitation signal is inadequate for assessing   PA pressure.   3. Left atrial size was mildly dilated.   4. The mitral valve is normal in structure. Trivial mitral valve  regurgitation. No evidence of mitral stenosis.   5. The aortic valve is tricuspid. Aortic valve regurgitation is mild. No  aortic stenosis is present.   6. Aortic dilatation noted. There is dilatation of the ascending aorta,  measuring 41 mm.   7. The inferior vena cava is normal in size with greater than 50%  respiratory variability, suggesting right atrial pressure of 3 mmHg.   Recent Labs: 04/27/2022: ALT 10; Hemoglobin 13.2; Platelets 207 05/25/2022: BUN 13; Creatinine, Ser 0.55; Potassium 4.7; Sodium 142  Recent Lipid Panel No results found for: "CHOL", "TRIG", "HDL", "CHOLHDL", "VLDL", "LDLCALC", "LDLDIRECT"  History of Present Illness    82 year old female with the above past medical history including hypertension, hyperlipidemia, and Crohn's disease.   She has a strong family history of premature CAD. Echocardiogram in December 2011 showed proximal septal thickening without outflow tract obstruction, normal LV function, G1 DD, mild MR and mild AI.  Lexiscan Myoview in 2008 revealed normal perfusion. Echocardiogram in October 2021 showed EF 55 to 65%, mild AI, mild MR. She has had difficult to control BP.  She was last seen in the office on 12/21/2022 and was stable from a cardiac standpoint though she did note ongoing elevated BP.  Valsartan was switched to valsartan/HCTZ.  Due to increased diarrhea with addition of hydrochlorothiazide.  Hydrochlorothiazide was subsequently discontinued and she was started on amlodipine.  Repeat  echocardiogram showed EF 60 to 65%, normal LV function, no RWMA, mild LVH, normal RV systolic function, mildly dilated left atrium, trivial mitral valve regurgitation, mild aortic valve regurgitation.  She presents today for follow-up accompanied by her daughter and for preoperative cardiac evaluation for upcoming right knee total  arthroplasty with Dr. Gaynelle Arabian of [facility].  Since her last visit she has been stable from a cardiac standpoint.  She is concerned that her blood pressure has been too low since starting amlodipine (SBP has ranged from the 110s to 130s).  She notes occasional lightheadedness, she denies any presyncope, syncope, denies dyspnea, edema, PND, orthopnea or weight gain, denies symptoms concerning for angina.  Overall, she reports feeling well.    Home Medications    Current Outpatient Medications  Medication Sig Dispense Refill   acetaminophen (TYLENOL) 500 MG tablet Take 1,000 mg by mouth every 8 (eight) hours as needed for moderate pain.     amLODipine (NORVASC) 5 MG tablet Take 1 tablet (5 mg total) by mouth daily. 90 tablet 3   meclizine (ANTIVERT) 25 MG tablet Take 25 mg by mouth 3 (three) times daily as needed for dizziness.     metoprolol succinate (TOPROL XL) 100 MG 24 hr tablet Take 1 tablet (100 mg total) by mouth daily. Take with or immediately following a meal. 90 tablet 3   Probiotic Product (PROBIOTIC DAILY PO) Take 1 capsule by mouth daily.     simvastatin (ZOCOR) 40 MG tablet Take 40 mg by mouth daily.     sodium chloride 0.9 % SOLN 250 mL with vedolizumab 300 MG SOLR 300 mg Inject 300 mg into the vein every 8 (eight) weeks.     traMADol (ULTRAM) 50 MG tablet Take 50 mg by mouth in the morning.     valsartan (DIOVAN) 160 MG tablet Take 1 tablet (160 mg total) by mouth daily. 90 tablet 3   Vitamin D, Ergocalciferol, 50 MCG (2000 UT) CAPS Take 2,000 Units by mouth daily.     No current facility-administered medications for this visit.     Review of Systems    She denies chest pain, palpitations, dyspnea, pnd, orthopnea, n, v, dizziness, syncope, edema, weight gain, or early satiety. All other systems reviewed and are otherwise negative except as noted above.   Physical Exam    VS:  BP 124/76 (BP Location: Left Arm, Patient Position: Sitting, Cuff Size: Normal)   Pulse 69    Ht 5\' 3"  (1.6 m)   Wt 173 lb 6.4 oz (78.7 kg)   SpO2 95%   BMI 30.72 kg/m  GEN: Well nourished, well developed, in no acute distress. HEENT: normal. Neck: Supple, no JVD, carotid bruits, or masses. Cardiac: RRR, no murmurs, rubs, or gallops. No clubbing, cyanosis, edema.  Radials/DP/PT 2+ and equal bilaterally.  Respiratory:  Respirations regular and unlabored, clear to auscultation bilaterally. GI: Soft, nontender, nondistended, BS + x 4. MS: no deformity or atrophy. Skin: warm and dry, no rash. Neuro:  Strength and sensation are intact. Psych: Normal affect.  Accessory Clinical Findings    ECG personally reviewed by me today -NSR, 69 bpm- no acute changes.   Lab Results  Component Value Date   WBC 5.6 04/27/2022   HGB 13.2 04/27/2022   HCT 41.4 04/27/2022   MCV 83.0 04/27/2022   PLT 207 04/27/2022   Lab Results  Component Value Date   CREATININE 0.55 (L) 05/25/2022   BUN 13 05/25/2022   NA 142 05/25/2022   K  4.7 05/25/2022   CL 105 05/25/2022   CO2 25 05/25/2022   Lab Results  Component Value Date   ALT 10 04/27/2022   AST 13 (L) 04/27/2022   ALKPHOS 53 04/27/2022   BILITOT 0.8 04/27/2022   No results found for: "CHOL", "HDL", "LDLCALC", "LDLDIRECT", "TRIG", "CHOLHDL"  No results found for: "HGBA1C"  Assessment & Plan   1. Hypertension: BP well controlled.  She has had intermittently low BP, occasional lightheadedness.  Will decrease amlodipine to 2.5 mg daily, goal BP less than 130/80.  Otherwise, continue current antihypertensive regimen.    2. Hyperlipidemia: LDL was 79 in February 2023.  Monitored and managed per PCP.  Continue simvastatin.   3. Crohn's disease: She receives vedolizumab infusions every 8 weeks. Denies any recent flares.  4. Preoperative cardiac exam: According to the Revised Cardiac Risk Index (RCRI), her Perioperative Risk of Major Cardiac Event is (%): 0.4. Her Functional Capacity in METs is: 6.36 according to the [facility] Activity Status  Index (DASI). Therefore, based on ACC/AHA guidelines, patient would be at acceptable risk for the planned procedure without further cardiovascular testing. I will route this recommendation to the requesting party via Epic fax function.  5. Disposition: Follow-up in 3-4 months.         Lenna Sciara, NP 02/17/2023, 3:42 PM

## 2023-02-18 ENCOUNTER — Encounter: Payer: Medicare Other | Admitting: Physical Therapy

## 2023-02-23 NOTE — Progress Notes (Signed)
Sent message, via epic in basket, requesting orders in epic from surgeon.  

## 2023-02-25 ENCOUNTER — Encounter: Payer: Medicare Other | Admitting: Physical Therapy

## 2023-02-28 NOTE — Patient Instructions (Addendum)
SURGICAL WAITING ROOM VISITATION Patients having surgery or a procedure may have no more than 2 support people in the waiting area - these visitors may rotate.    Children under the age of 14 must have an adult with them who is not the patient.  If the patient needs to stay at the hospital during part of their recovery, the visitor guidelines for inpatient rooms apply. Pre-op nurse will coordinate an appropriate time for 1 support person to accompany patient in pre-op.  This support person may not rotate.    Please refer to the [facility] website for the visitor guidelines for Inpatients (after your surgery is over and you are in a regular room).       Your procedure is scheduled on: 03-14-23   Report to [facility] Main Entrance    Report to admitting at 6:55 AM   Call this number if you have problems the morning of surgery [phone]   Do not eat food :After Midnight.   After Midnight you may have the following liquids until 6:20 AM DAY OF SURGERY  Water Non-Citrus Juices (without pulp, NO RED) Carbonated Beverages Black Coffee (NO MILK/CREAM OR CREAMERS, sugar ok)  Clear Tea (NO MILK/CREAM OR CREAMERS, sugar ok) regular and decaf                             Plain Jell-O (NO RED)                                           Fruit ices (not with fruit pulp, NO RED)                                     Popsicles (NO RED)                                                               Sports drinks like Gatorade (NO RED)                   The day of surgery:  Drink ONE (1) Pre-Surgery Clear Ensure at 6:20 AM the morning of surgery. Drink in one sitting. Do not sip.  This drink was given to you during your hospital  pre-op appointment visit. Nothing else to drink after completing the Pre-Surgery Clear Ensure.          If you have questions, please contact your surgeon's office.   FOLLOW ANY ADDITIONAL PRE OP INSTRUCTIONS YOU RECEIVED FROM YOUR SURGEON'S OFFICE!!!      Oral Hygiene is also important to reduce your risk of infection.                                    Remember - BRUSH YOUR TEETH THE MORNING OF SURGERY WITH YOUR REGULAR TOOTHPASTE   Do NOT smoke after Midnight   Take these medicines the morning of surgery with A SIP OF WATER:   Amlodipine  Metoprolol  Tramadol or Tylenol if  needed  Bring CPAP mask and tubing day of surgery.                              You may not have any metal on your body including hair pins, jewelry, and body piercing             Do not wear make-up, lotions, powders, perfumes or deodorant  Do not wear nail polish including gel and S&S, artificial/acrylic nails, or any other type of covering on natural nails including finger and toenails. If you have artificial nails, gel coating, etc. that needs to be removed by a nail salon please have this removed prior to surgery or surgery may need to be canceled/ delayed if the surgeon/ anesthesia feels like they are unable to be safely monitored.   Do not shave  48 hours prior to surgery.    Do not bring valuables to the hospital. [facility] IS NOT RESPONSIBLE   FOR VALUABLES.   Contacts, dentures or bridgework may not be worn into surgery.   Bring small overnight bag day of surgery.   DO NOT BRING YOUR HOME MEDICATIONS TO THE HOSPITAL. PHARMACY WILL DISPENSE MEDICATIONS LISTED ON YOUR MEDICATION LIST TO YOU DURING YOUR ADMISSION IN THE HOSPITAL!    Special Instructions: Bring a copy of your healthcare power of attorney and living will documents the day of surgery if you haven't scanned them before.              Please read over the following fact sheets you were given: IF YOU HAVE QUESTIONS ABOUT YOUR PRE-OP INSTRUCTIONS PLEASE CALL [phone] [facility]  If you received a COVID test during your pre-op visit  it is requested that you wear a mask when out in public, stay away from anyone that may not be feeling well and notify your surgeon if you develop symptoms. If you  test positive for Covid or have been in contact with anyone that has tested positive in the last 10 days please notify you surgeon.    Incentive Spirometer  An incentive spirometer is a tool that can help keep your lungs clear and active. This tool measures how well you are filling your lungs with each breath. Taking long deep breaths may help reverse or decrease the chance of developing breathing (pulmonary) problems (especially infection) following: A long period of time when you are unable to move or be active. BEFORE THE PROCEDURE  If the spirometer includes an indicator to show your best effort, your nurse or respiratory therapist will set it to a desired goal. If possible, sit up straight or lean slightly forward. Try not to slouch. Hold the incentive spirometer in an upright position. INSTRUCTIONS FOR USE  Sit on the edge of your bed if possible, or sit up as far as you can in bed or on a chair. Hold the incentive spirometer in an upright position. Breathe out normally. Place the mouthpiece in your mouth and seal your lips tightly around it. Breathe in slowly and as deeply as possible, raising the piston or the ball toward the top of the column. Hold your breath for 3-5 seconds or for as long as possible. Allow the piston or ball to fall to the bottom of the column. Remove the mouthpiece from your mouth and breathe out normally. Rest for a few seconds and repeat Steps 1 through 7 at least 10 times every 1-2 hours when you are awake.  Take your time and take a few normal breaths between deep breaths. The spirometer may include an indicator to show your best effort. Use the indicator as a goal to work toward during each repetition. After each set of 10 deep breaths, practice coughing to be sure your lungs are clear. If you have an incision (the cut made at the time of surgery), support your incision when coughing by placing a pillow or rolled up towels firmly against it. Once you are able  to get out of bed, walk around indoors and cough well. You may stop using the incentive spirometer when instructed by your caregiver.  RISKS AND COMPLICATIONS Take your time so you do not get dizzy or light-headed. If you are in pain, you may need to take or ask for pain medication before doing incentive spirometry. It is harder to take a deep breath if you are having pain. AFTER USE Rest and breathe slowly and easily. It can be helpful to keep track of a log of your progress. Your caregiver can provide you with a simple table to help with this. If you are using the spirometer at home, follow these instructions: SEEK MEDICAL CARE IF:  You are having difficultly using the spirometer. You have trouble using the spirometer as often as instructed. Your pain medication is not giving enough relief while using the spirometer. You develop fever of 100.5 F (38.1 C) or higher. SEEK IMMEDIATE MEDICAL CARE IF:  You cough up bloody sputum that had not been present before. You develop fever of 102 F (38.9 C) or greater. You develop worsening pain at or near the incision site. MAKE SURE YOU:  Understand these instructions. Will watch your condition. Will get help right away if you are not doing well or get worse. Document Released: 03/14/2007 Document Revised: 01/24/2012 Document Reviewed: 05/15/2007 [facility] Patient Information 2014 [place], [place].   ________________________________________________________________________

## 2023-02-28 NOTE — Progress Notes (Addendum)
COVID Vaccine Completed:  Yes  Date of COVID positive in last 90 days:  PCP - [name], MD Cardiologist - Nicki Guadalajara, MD  Cardiac clearance in Epic dated 02-17-23 by Bernadene Person, NP  Chest x-ray - 04-27-22 Epic EKG - 02-17-23 Epic Stress Test - 10-02-07 Epic ECHO - 02-07-23 Epic Cardiac Cath -  Pacemaker/ICD device last checked: Spinal Cord Stimulator:  Bowel Prep -   Sleep Study -  CPAP -   Fasting Blood Sugar -  Checks Blood Sugar _____ times a day  Last dose of GLP1 agonist-  N/A GLP1 instructions:  N/A   Last dose of SGLT-2 inhibitors-  N/A SGLT-2 instructions: N/A   Blood Thinner Instructions: Aspirin Instructions: Last Dose:  Activity level:  Can go up a flight of stairs and perform activities of daily living without stopping and without symptoms of chest pain or shortness of breath.  Able to exercise without symptoms  Unable to go up a flight of stairs without symptoms of     Anesthesia review:  Palpitations and HTN followed by cardiology.    Patient denies shortness of breath, fever, cough and chest pain at PAT appointment  Patient verbalized understanding of instructions that were given to them at the PAT appointment. Patient was also instructed that they will need to review over the PAT instructions again at home before surgery.

## 2023-03-01 ENCOUNTER — Ambulatory Visit: Payer: Medicare Other | Admitting: Internal Medicine

## 2023-03-01 DIAGNOSIS — M25512 Pain in left shoulder: Secondary | ICD-10-CM | POA: Diagnosis not present

## 2023-03-01 DIAGNOSIS — M25511 Pain in right shoulder: Secondary | ICD-10-CM | POA: Diagnosis not present

## 2023-03-02 ENCOUNTER — Other Ambulatory Visit: Payer: Self-pay

## 2023-03-02 ENCOUNTER — Encounter (HOSPITAL_COMMUNITY)
Admission: RE | Admit: 2023-03-02 | Discharge: 2023-03-02 | Disposition: A | Discharge status: Home / Self Care | Payer: Medicare Other | Source: Ambulatory Visit | Attending: Orthopedic Surgery | Admitting: Orthopedic Surgery

## 2023-03-02 ENCOUNTER — Encounter (HOSPITAL_COMMUNITY): Payer: Self-pay

## 2023-03-02 VITALS — BP 138/75 | HR 72 | Temp 98.3°F | Resp 20 | Ht 63.0 in | Wt 172.0 lb

## 2023-03-02 DIAGNOSIS — M1711 Unilateral primary osteoarthritis, right knee: Secondary | ICD-10-CM | POA: Insufficient documentation

## 2023-03-02 DIAGNOSIS — Z01818 Encounter for other preprocedural examination: Secondary | ICD-10-CM

## 2023-03-02 DIAGNOSIS — R7303 Prediabetes: Secondary | ICD-10-CM | POA: Diagnosis not present

## 2023-03-02 DIAGNOSIS — I10 Essential (primary) hypertension: Secondary | ICD-10-CM | POA: Diagnosis not present

## 2023-03-02 DIAGNOSIS — D649 Anemia, unspecified: Secondary | ICD-10-CM | POA: Diagnosis not present

## 2023-03-02 HISTORY — DX: Prediabetes: R73.03

## 2023-03-02 LAB — CBC
HCT: 38.7 % (ref 36.0–46.0)
Hemoglobin: 12 g/dL (ref 12.0–15.0)
MCH: 26 pg (ref 26.0–34.0)
MCHC: 31 g/dL (ref 30.0–36.0)
MCV: 83.9 fL (ref 80.0–100.0)
Platelets: 224 10*3/uL (ref 150–400)
RBC: 4.61 MIL/uL (ref 3.87–5.11)
RDW: 14.7 % (ref 11.5–15.5)
WBC: 5.4 10*3/uL (ref 4.0–10.5)
nRBC: 0 % (ref 0.0–0.2)

## 2023-03-02 LAB — HEMOGLOBIN A1C
Hgb A1c MFr Bld: 5.3 % (ref 4.8–5.6)
Mean Plasma Glucose: 105.41 mg/dL

## 2023-03-02 LAB — BASIC METABOLIC PANEL
Anion gap: 8 (ref 5–15)
BUN: 14 mg/dL (ref 8–23)
CO2: 23 mmol/L (ref 22–32)
Calcium: 8.9 mg/dL (ref 8.9–10.3)
Chloride: 107 mmol/L (ref 98–111)
Creatinine, Ser: 0.46 mg/dL (ref 0.44–1.00)
GFR, Estimated: >60 mL/min (ref >60)
Glucose, Bld: 98 mg/dL (ref 70–99)
Potassium: 4.1 mmol/L (ref 3.5–5.1)
Sodium: 138 mmol/L (ref 135–145)

## 2023-03-02 LAB — SURGICAL PCR SCREEN
MRSA, PCR: NEGATIVE
Staphylococcus aureus: NEGATIVE

## 2023-03-02 LAB — GLUCOSE, CAPILLARY: Glucose-Capillary: 106 mg/dL — ABNORMAL HIGH (ref 70–99)

## 2023-03-03 DIAGNOSIS — M19011 Primary osteoarthritis, right shoulder: Secondary | ICD-10-CM | POA: Diagnosis not present

## 2023-03-04 NOTE — Anesthesia Preprocedure Evaluation (Addendum)
Anesthesia Evaluation  Patient identified by MRN, date of birth, ID band Patient awake    Reviewed: Allergy & Precautions, NPO status , Patient's Chart, lab work & pertinent test results  Airway Mallampati: II  TM Distance: >3 FB Neck ROM: Full    Dental  (+) Dental Advisory Given   Pulmonary neg pulmonary ROS   breath sounds clear to auscultation       Cardiovascular hypertension, Pt. on medications  Rhythm:Regular Rate:Normal     Neuro/Psych negative neurological ROS     GI/Hepatic Neg liver ROS,GERD  ,,  Endo/Other  negative endocrine ROS    Renal/GU negative Renal ROS     Musculoskeletal  (+) Arthritis ,    Abdominal   Peds  Hematology negative hematology ROS (+)   Anesthesia Other Findings   Reproductive/Obstetrics                             Anesthesia Physical Anesthesia Plan  ASA: 2  Anesthesia Plan: Spinal   Post-op Pain Management: Tylenol PO (pre-op)* and Regional block*   Induction:   PONV Risk Score and Plan: 2 and Dexamethasone, Ondansetron, Propofol infusion and Treatment may vary due to age or medical condition  Airway Management Planned: Natural Airway and Simple Face Mask  Additional Equipment: None  Intra-op Plan:   Post-operative Plan:   Informed Consent: I have reviewed the patients History and Physical, chart, labs and discussed the procedure including the risks, benefits and alternatives for the proposed anesthesia with the patient or authorized representative who has indicated his/her understanding and acceptance.     Dental advisory given  Plan Discussed with: CRNA  Anesthesia Plan Comments:        Anesthesia Quick Evaluation

## 2023-03-04 NOTE — Progress Notes (Signed)
Anesthesia Chart Review   Case: [id number] Date/Time: 03/14/23 0910   Procedure: TOTAL KNEE ARTHROPLASTY (Right: Knee)   Anesthesia type: Choice   Pre-op diagnosis: right knee osteoarthritis   Location: [facility] ROOM 09 / [facility]   Surgeons: Ollen Gross, MD       DISCUSSION:82 y.o. never smoker with h/o HTN, right knee OA scheduled for above procedure 03/14/23 with Dr. Ollen Gross.   Pt last seen by cardiology 02/17/2023. Per OV note, "According to the Revised Cardiac Risk Index (RCRI), her Perioperative Risk of Major Cardiac Event is (%): 0.4. Her Functional Capacity in METs is: 6.36 according to the [facility] Activity Status Index (DASI). Therefore, based on ACC/AHA guidelines, patient would be at acceptable risk for the planned procedure without further cardiovascular testing. I will route this recommendation to the requesting party via Epic fax function."  Anticipate pt can proceed with planned procedure barring acute status change.   VS: BP 138/75   Pulse 72   Temp 36.8 C (Oral)   Resp 20   Ht  (1.6 m)   Wt 78 kg   SpO2 99%   BMI 30.46 kg/m   PROVIDERS: [name], MD is PCP   Primary Cardiologist:  Nicki Guadalajara, MD  LABS: Labs reviewed: Acceptable for surgery. (all labs ordered are listed, but only abnormal results are displayed)  Labs Reviewed  GLUCOSE, CAPILLARY - Abnormal; Notable for the following components:      Result Value   Glucose-Capillary 106 (*)    All other components within normal limits  SURGICAL PCR SCREEN  BASIC METABOLIC PANEL  CBC  HEMOGLOBIN A1C     IMAGES:   EKG:   CV: Echo 02/07/2023 1. Left ventricular ejection fraction, by estimation, is 60 to 65%. The  left ventricle has normal function. The left ventricle has no regional  wall motion abnormalities. There is mild left ventricular hypertrophy.  Left ventricular diastolic parameters  were normal.   2. Right ventricular systolic function is normal. The right ventricular  size is  normal. Tricuspid regurgitation signal is inadequate for assessing  PA pressure.   3. Left atrial size was mildly dilated.   4. The mitral valve is normal in structure. Trivial mitral valve  regurgitation. No evidence of mitral stenosis.   5. The aortic valve is tricuspid. Aortic valve regurgitation is mild. No  aortic stenosis is present.   6. Aortic dilatation noted. There is dilatation of the ascending aorta,  measuring 41 mm.   7. The inferior vena cava is normal in size with greater than 50%  respiratory variability, suggesting right atrial pressure of 3 mmHg.  Past Medical History:  Diagnosis Date   Adenomatous colon polyp    Arthritis    Crohn's disease of both small and large intestine with other complication 10/23/2020   Diverticulosis    High cholesterol    Hypertension    IBS (irritable bowel syndrome)    Palpitations    Pre-diabetes    UTI (urinary tract infection)     Past Surgical History:  Procedure Laterality Date   BILATERAL CARPAL TUNNEL RELEASE     COLONOSCOPY     NM MYOCAR PERF WALL MOTION  10/02/2007   No significant ischemia demonstrated   TOTAL KNEE ARTHROPLASTY Left 02/21/2017   Procedure: LEFT TOTAL KNEE ARTHROPLASTY;  Surgeon: Ollen Gross, MD;  Location: [facility];  Service: Orthopedics;  Laterality: Left;  requests ; Adductor Block   US ECHOCARDIOGRAPHY  10/29/2010   Proximal septal thickening  noted, EF =>55%,LA mildly dilated,mild mitral annular ca+, AOV mildly sclerotic, mild AI.    MEDICATIONS:  acetaminophen (TYLENOL) 500 MG tablet   amLODipine (NORVASC) 2.5 MG tablet   metoprolol succinate (TOPROL XL) 100 MG 24 hr tablet   Probiotic Product (PROBIOTIC DAILY PO)   simvastatin (ZOCOR) 40 MG tablet   sodium chloride 0.9 % SOLN 250 mL with vedolizumab 300 MG SOLR 300 mg   traMADol (ULTRAM) 50 MG tablet   valsartan (DIOVAN) 160 MG tablet   Vitamin D, Ergocalciferol, 50 MCG (2000 UT) CAPS   No current facility-administered medications for  this encounter.   Jodell Cipro Ward, PA-C [facility] Pre-Surgical Testing [phone]

## 2023-03-14 ENCOUNTER — Observation Stay (HOSPITAL_COMMUNITY)
Admission: RE | Admit: 2023-03-14 | Discharge: 2023-03-16 | Disposition: A | Discharge status: Home / Self Care | Payer: Medicare Other | Attending: Orthopedic Surgery | Admitting: Orthopedic Surgery

## 2023-03-14 ENCOUNTER — Ambulatory Visit (HOSPITAL_COMMUNITY): Payer: Medicare Other | Admitting: Physician Assistant

## 2023-03-14 ENCOUNTER — Encounter (HOSPITAL_COMMUNITY)
Admission: RE | Disposition: A | Discharge status: Home / Self Care | Payer: Self-pay | Source: Home / Self Care | Attending: Orthopedic Surgery

## 2023-03-14 ENCOUNTER — Ambulatory Visit (HOSPITAL_BASED_OUTPATIENT_CLINIC_OR_DEPARTMENT_OTHER): Payer: Medicare Other | Admitting: Certified Registered Nurse Anesthetist

## 2023-03-14 ENCOUNTER — Other Ambulatory Visit: Payer: Self-pay

## 2023-03-14 ENCOUNTER — Encounter (HOSPITAL_COMMUNITY): Payer: Self-pay | Admitting: Orthopedic Surgery

## 2023-03-14 DIAGNOSIS — M1711 Unilateral primary osteoarthritis, right knee: Secondary | ICD-10-CM | POA: Diagnosis not present

## 2023-03-14 DIAGNOSIS — Z79899 Other long term (current) drug therapy: Secondary | ICD-10-CM | POA: Insufficient documentation

## 2023-03-14 DIAGNOSIS — M179 Osteoarthritis of knee, unspecified: Secondary | ICD-10-CM | POA: Diagnosis present

## 2023-03-14 DIAGNOSIS — I10 Essential (primary) hypertension: Secondary | ICD-10-CM | POA: Diagnosis not present

## 2023-03-14 DIAGNOSIS — Z96652 Presence of left artificial knee joint: Secondary | ICD-10-CM | POA: Insufficient documentation

## 2023-03-14 DIAGNOSIS — G8918 Other acute postprocedural pain: Secondary | ICD-10-CM | POA: Diagnosis not present

## 2023-03-14 HISTORY — PX: TOTAL KNEE ARTHROPLASTY: SHX125

## 2023-03-14 SURGERY — ARTHROPLASTY, KNEE, TOTAL
Anesthesia: Spinal | Site: Knee | Laterality: Right

## 2023-03-14 MED ORDER — PHENYLEPHRINE HCL-NACL 20-0.9 MG/250ML-% IV SOLN
INTRAVENOUS | Status: DC | PRN
  Administered 2023-03-14: 35 ug/min via INTRAVENOUS

## 2023-03-14 MED ORDER — ONDANSETRON HCL 4 MG/2ML IJ SOLN: 4.0000 mg | Freq: Four times a day (QID) | INTRAMUSCULAR | Status: DC | PRN

## 2023-03-14 MED ORDER — PROPOFOL 10 MG/ML IV BOLUS
INTRAVENOUS | Status: DC | PRN
  Administered 2023-03-14: 20 mg via INTRAVENOUS

## 2023-03-14 MED ORDER — PROPOFOL 500 MG/50ML IV EMUL
INTRAVENOUS | Status: DC | PRN
  Administered 2023-03-14: 100 ug/kg/min via INTRAVENOUS

## 2023-03-14 MED ORDER — ORAL CARE MOUTH RINSE: 15.0000 mL | Freq: Once | OROMUCOSAL | Status: AC

## 2023-03-14 MED ORDER — DEXAMETHASONE SODIUM PHOSPHATE 10 MG/ML IJ SOLN
INTRAMUSCULAR | Status: AC
  Filled 2023-03-14: qty 1

## 2023-03-14 MED ORDER — CEFAZOLIN SODIUM-DEXTROSE 2-4 GM/100ML-% IV SOLN
2.0000 g | Freq: Four times a day (QID) | INTRAVENOUS | Status: AC
  Administered 2023-03-14 (×2): 2 g via INTRAVENOUS
  Filled 2023-03-14 (×2): qty 100

## 2023-03-14 MED ORDER — DOCUSATE SODIUM 100 MG PO CAPS
100.0000 mg | ORAL_CAPSULE | Freq: Two times a day (BID) | ORAL | Status: DC
  Administered 2023-03-14 – 2023-03-16 (×4): 100 mg via ORAL
  Filled 2023-03-14 (×4): qty 1

## 2023-03-14 MED ORDER — HYDROMORPHONE HCL 1 MG/ML IJ SOLN: 0.5000 mg | INTRAMUSCULAR | Status: DC | PRN

## 2023-03-14 MED ORDER — RIVAROXABAN 10 MG PO TABS
10.0000 mg | ORAL_TABLET | Freq: Every day | ORAL | Status: DC
  Administered 2023-03-15 – 2023-03-16 (×2): 10 mg via ORAL
  Filled 2023-03-14 (×2): qty 1

## 2023-03-14 MED ORDER — FENTANYL CITRATE PF 50 MCG/ML IJ SOSY
50.0000 ug | PREFILLED_SYRINGE | Freq: Once | INTRAMUSCULAR | Status: AC
  Administered 2023-03-14: 50 ug via INTRAVENOUS
  Filled 2023-03-14: qty 2

## 2023-03-14 MED ORDER — SODIUM CHLORIDE (PF) 0.9 % IJ SOLN
INTRAMUSCULAR | Status: AC
  Filled 2023-03-14: qty 50

## 2023-03-14 MED ORDER — METHOCARBAMOL 500 MG PO TABS
500.0000 mg | ORAL_TABLET | Freq: Four times a day (QID) | ORAL | Status: DC | PRN
  Administered 2023-03-15 – 2023-03-16 (×4): 500 mg via ORAL
  Filled 2023-03-14 (×5): qty 1

## 2023-03-14 MED ORDER — SODIUM CHLORIDE 0.9 % IV SOLN
INTRAVENOUS | Status: DC | PRN
  Administered 2023-03-14: 80 mL

## 2023-03-14 MED ORDER — DEXAMETHASONE SODIUM PHOSPHATE 10 MG/ML IJ SOLN
10.0000 mg | Freq: Once | INTRAMUSCULAR | Status: DC
  Filled 2023-03-14: qty 1

## 2023-03-14 MED ORDER — BUPIVACAINE IN DEXTROSE 0.75-8.25 % IT SOLN
INTRATHECAL | Status: DC | PRN
  Administered 2023-03-14: 1.6 mL via INTRATHECAL

## 2023-03-14 MED ORDER — ONDANSETRON HCL 4 MG/2ML IJ SOLN
INTRAMUSCULAR | Status: DC | PRN
  Administered 2023-03-14: 4 mg via INTRAVENOUS

## 2023-03-14 MED ORDER — LACTATED RINGERS IV SOLN: INTRAVENOUS | Status: DC

## 2023-03-14 MED ORDER — CHLORHEXIDINE GLUCONATE 0.12 % MT SOLN
15.0000 mL | Freq: Once | OROMUCOSAL | Status: AC
  Administered 2023-03-14: 15 mL via OROMUCOSAL

## 2023-03-14 MED ORDER — ONDANSETRON HCL 4 MG PO TABS: 4.0000 mg | ORAL_TABLET | Freq: Four times a day (QID) | ORAL | Status: DC | PRN

## 2023-03-14 MED ORDER — IRBESARTAN 150 MG PO TABS
150.0000 mg | ORAL_TABLET | Freq: Every day | ORAL | Status: DC
  Filled 2023-03-14 (×2): qty 1

## 2023-03-14 MED ORDER — CLONIDINE HCL (ANALGESIA) 100 MCG/ML EP SOLN
EPIDURAL | Status: DC | PRN
  Administered 2023-03-14: 50 ug

## 2023-03-14 MED ORDER — ACETAMINOPHEN 500 MG PO TABS
1000.0000 mg | ORAL_TABLET | Freq: Four times a day (QID) | ORAL | Status: AC
  Administered 2023-03-14 – 2023-03-15 (×4): 1000 mg via ORAL
  Filled 2023-03-14 (×4): qty 2

## 2023-03-14 MED ORDER — TRAMADOL HCL 50 MG PO TABS
50.0000 mg | ORAL_TABLET | Freq: Four times a day (QID) | ORAL | Status: DC | PRN
  Administered 2023-03-15 – 2023-03-16 (×2): 100 mg via ORAL
  Filled 2023-03-14 (×2): qty 2

## 2023-03-14 MED ORDER — DIPHENHYDRAMINE HCL 12.5 MG/5ML PO ELIX: 12.5000 mg | ORAL_SOLUTION | ORAL | Status: DC | PRN

## 2023-03-14 MED ORDER — PHENOL 1.4 % MT LIQD: 1.0000 | OROMUCOSAL | Status: DC | PRN

## 2023-03-14 MED ORDER — METOCLOPRAMIDE HCL 5 MG PO TABS: 5.0000 mg | ORAL_TABLET | Freq: Three times a day (TID) | ORAL | Status: DC | PRN

## 2023-03-14 MED ORDER — ACETAMINOPHEN 10 MG/ML IV SOLN: 1000.0000 mg | Freq: Four times a day (QID) | INTRAVENOUS | Status: DC

## 2023-03-14 MED ORDER — TRANEXAMIC ACID-NACL 1000-0.7 MG/100ML-% IV SOLN
1000.0000 mg | INTRAVENOUS | Status: AC
  Administered 2023-03-14: 1000 mg via INTRAVENOUS
  Filled 2023-03-14: qty 100

## 2023-03-14 MED ORDER — DEXAMETHASONE SODIUM PHOSPHATE 10 MG/ML IJ SOLN: 8.0000 mg | Freq: Once | INTRAMUSCULAR | Status: DC

## 2023-03-14 MED ORDER — POLYETHYLENE GLYCOL 3350 17 G PO PACK: 17.0000 g | PACK | Freq: Every day | ORAL | Status: DC | PRN

## 2023-03-14 MED ORDER — POVIDONE-IODINE 10 % EX SWAB
2.0000 | Freq: Once | CUTANEOUS | Status: AC
  Administered 2023-03-14: 2 via TOPICAL

## 2023-03-14 MED ORDER — ONDANSETRON HCL 4 MG/2ML IJ SOLN
INTRAMUSCULAR | Status: AC
  Filled 2023-03-14: qty 2

## 2023-03-14 MED ORDER — BISACODYL 10 MG RE SUPP: 10.0000 mg | Freq: Every day | RECTAL | Status: DC | PRN

## 2023-03-14 MED ORDER — MENTHOL 3 MG MT LOZG: 1.0000 | LOZENGE | OROMUCOSAL | Status: DC | PRN

## 2023-03-14 MED ORDER — OXYCODONE HCL 5 MG PO TABS
5.0000 mg | ORAL_TABLET | ORAL | Status: DC | PRN
  Administered 2023-03-14 (×3): 5 mg via ORAL
  Administered 2023-03-15 (×3): 10 mg via ORAL
  Filled 2023-03-14 (×2): qty 2
  Filled 2023-03-14 (×2): qty 1
  Filled 2023-03-14: qty 2
  Filled 2023-03-14 (×2): qty 1

## 2023-03-14 MED ORDER — ROPIVACAINE HCL 5 MG/ML IJ SOLN
INTRAMUSCULAR | Status: DC | PRN
  Administered 2023-03-14: 20 mL via PERINEURAL

## 2023-03-14 MED ORDER — METOPROLOL SUCCINATE ER 50 MG PO TB24
100.0000 mg | ORAL_TABLET | Freq: Every day | ORAL | Status: DC
  Administered 2023-03-15: 100 mg via ORAL
  Filled 2023-03-14 (×2): qty 2

## 2023-03-14 MED ORDER — METOCLOPRAMIDE HCL 5 MG/ML IJ SOLN: 5.0000 mg | Freq: Three times a day (TID) | INTRAMUSCULAR | Status: DC | PRN

## 2023-03-14 MED ORDER — CEFAZOLIN SODIUM-DEXTROSE 2-4 GM/100ML-% IV SOLN
2.0000 g | INTRAVENOUS | Status: AC
  Administered 2023-03-14: 2 g via INTRAVENOUS
  Filled 2023-03-14: qty 100

## 2023-03-14 MED ORDER — SIMVASTATIN 40 MG PO TABS
40.0000 mg | ORAL_TABLET | Freq: Every evening | ORAL | Status: DC
  Administered 2023-03-15: 40 mg via ORAL
  Filled 2023-03-14: qty 1

## 2023-03-14 MED ORDER — METHOCARBAMOL 1000 MG/10ML IJ SOLN: 500.0000 mg | Freq: Four times a day (QID) | INTRAVENOUS | Status: DC | PRN

## 2023-03-14 MED ORDER — ACETAMINOPHEN 500 MG PO TABS
ORAL_TABLET | ORAL | Status: AC
  Administered 2023-03-14: 500 mg via ORAL
  Filled 2023-03-14: qty 1

## 2023-03-14 MED ORDER — SODIUM CHLORIDE (PF) 0.9 % IJ SOLN
INTRAMUSCULAR | Status: AC
  Filled 2023-03-14: qty 10

## 2023-03-14 MED ORDER — PROPOFOL 1000 MG/100ML IV EMUL
INTRAVENOUS | Status: AC
  Filled 2023-03-14: qty 100

## 2023-03-14 MED ORDER — BUPIVACAINE LIPOSOME 1.3 % IJ SUSP
INTRAMUSCULAR | Status: AC
  Filled 2023-03-14: qty 20

## 2023-03-14 MED ORDER — AMLODIPINE BESYLATE 5 MG PO TABS
2.5000 mg | ORAL_TABLET | Freq: Every day | ORAL | Status: DC
  Filled 2023-03-14 (×2): qty 1

## 2023-03-14 MED ORDER — SODIUM CHLORIDE 0.9 % IR SOLN
Status: DC | PRN
  Administered 2023-03-14 (×2): 1000 mL

## 2023-03-14 MED ORDER — ACETAMINOPHEN 500 MG PO TABS: 500.0000 mg | ORAL_TABLET | Freq: Once | ORAL | Status: AC

## 2023-03-14 MED ORDER — FLEET ENEMA 7-19 GM/118ML RE ENEM: 1.0000 | ENEMA | Freq: Once | RECTAL | Status: DC | PRN

## 2023-03-14 MED ORDER — SODIUM CHLORIDE 0.9 % IV SOLN: INTRAVENOUS | Status: DC

## 2023-03-14 MED ORDER — BUPIVACAINE LIPOSOME 1.3 % IJ SUSP: 20.0000 mL | Freq: Once | INTRAMUSCULAR | Status: DC

## 2023-03-14 SURGICAL SUPPLY — 60 items
ATTUNE MED DOME PAT 38 KNEE (Knees) IMPLANT
ATTUNE PS FEM RT SZ 6 CEM KNEE (Femur) IMPLANT
ATTUNE PSRP INSR SZ6 12 KNEE (Insert) IMPLANT
BAG COUNTER SPONGE SURGICOUNT (BAG) IMPLANT
BAG SPEC THK2 15X12 ZIP CLS (MISCELLANEOUS) ×1
BAG SPNG CNTER NS LX DISP (BAG)
BAG ZIPLOCK 12X15 (MISCELLANEOUS) ×2 IMPLANT
BASE TIBIAL ROT PLAT SZ 5 KNEE (Knees) IMPLANT
BLADE SAG 18X100X1.27 (BLADE) ×2 IMPLANT
BLADE SAW SGTL 11.0X1.19X90.0M (BLADE) ×2 IMPLANT
BNDG CMPR 5X62 HK CLSR LF (GAUZE/BANDAGES/DRESSINGS) ×1
BNDG CMPR MED 10X6 ELC LF (GAUZE/BANDAGES/DRESSINGS) ×1
BNDG ELASTIC 6INX 5YD STR LF (GAUZE/BANDAGES/DRESSINGS) ×2 IMPLANT
BNDG ELASTIC 6X10 VLCR STRL LF (GAUZE/BANDAGES/DRESSINGS) IMPLANT
BOWL SMART MIX CTS (DISPOSABLE) ×2 IMPLANT
BSPLAT TIB 5 CMNT ROT PLAT STR (Knees) ×1 IMPLANT
CEMENT HV SMART SET (Cement) ×4 IMPLANT
COVER SURGICAL LIGHT HANDLE (MISCELLANEOUS) ×2 IMPLANT
CUFF TOURN SGL QUICK 34 (TOURNIQUET CUFF) ×1
CUFF TRNQT CYL 34X4.125X (TOURNIQUET CUFF) ×2 IMPLANT
DRAPE INCISE IOBAN 66X45 STRL (DRAPES) ×2 IMPLANT
DRAPE U-SHAPE 47X51 STRL (DRAPES) ×2 IMPLANT
DRSG AQUACEL AG ADV 3.5X10 (GAUZE/BANDAGES/DRESSINGS) ×2 IMPLANT
DURAPREP 26ML APPLICATOR (WOUND CARE) ×2 IMPLANT
ELECT REM PT RETURN 15FT ADLT (MISCELLANEOUS) ×2 IMPLANT
GLOVE BIO SURGEON STRL SZ 6.5 (GLOVE) IMPLANT
GLOVE BIO SURGEON STRL SZ7.5 (GLOVE) IMPLANT
GLOVE BIO SURGEON STRL SZ8 (GLOVE) ×2 IMPLANT
GLOVE BIOGEL PI IND STRL 6.5 (GLOVE) IMPLANT
GLOVE BIOGEL PI IND STRL 7.0 (GLOVE) IMPLANT
GLOVE BIOGEL PI IND STRL 8 (GLOVE) ×2 IMPLANT
GOWN STRL REUS W/ TWL LRG LVL3 (GOWN DISPOSABLE) ×2 IMPLANT
GOWN STRL REUS W/ TWL XL LVL3 (GOWN DISPOSABLE) IMPLANT
GOWN STRL REUS W/TWL LRG LVL3 (GOWN DISPOSABLE) ×1
GOWN STRL REUS W/TWL XL LVL3 (GOWN DISPOSABLE)
HANDPIECE INTERPULSE COAX TIP (DISPOSABLE) ×1
HOLDER FOLEY CATH W/STRAP (MISCELLANEOUS) IMPLANT
IMMOBILIZER KNEE 20 (SOFTGOODS) ×1
IMMOBILIZER KNEE 20 THIGH 36 (SOFTGOODS) ×2 IMPLANT
KIT TURNOVER KIT A (KITS) IMPLANT
MANIFOLD NEPTUNE II (INSTRUMENTS) ×2 IMPLANT
NS IRRIG 1000ML POUR BTL (IV SOLUTION) ×2 IMPLANT
PACK TOTAL KNEE CUSTOM (KITS) ×2 IMPLANT
PADDING CAST ABS COTTON 6X4 NS (CAST SUPPLIES) IMPLANT
PADDING CAST COTTON 6X4 STRL (CAST SUPPLIES) ×4 IMPLANT
PIN STEINMAN FIXATION KNEE (PIN) IMPLANT
PROTECTOR NERVE ULNAR (MISCELLANEOUS) ×2 IMPLANT
SET HNDPC FAN SPRY TIP SCT (DISPOSABLE) ×2 IMPLANT
SPIKE FLUID TRANSFER (MISCELLANEOUS) ×2 IMPLANT
STRIP CLOSURE SKIN 1/2X4 (GAUZE/BANDAGES/DRESSINGS) ×4 IMPLANT
SUT MNCRL AB 4-0 PS2 18 (SUTURE) ×2 IMPLANT
SUT STRATAFIX 0 PDS 27 VIOLET (SUTURE) ×1
SUT VIC AB 2-0 CT1 27 (SUTURE) ×3
SUT VIC AB 2-0 CT1 TAPERPNT 27 (SUTURE) ×6 IMPLANT
SUTURE STRATFX 0 PDS 27 VIOLET (SUTURE) ×2 IMPLANT
TIBIAL BASE ROT PLAT SZ 5 KNEE (Knees) ×1 IMPLANT
TRAY FOLEY MTR SLVR 16FR STAT (SET/KITS/TRAYS/PACK) ×2 IMPLANT
TUBE SUCTION HIGH CAP CLEAR NV (SUCTIONS) ×2 IMPLANT
WATER STERILE IRR 1000ML POUR (IV SOLUTION) ×4 IMPLANT
WRAP KNEE MAXI GEL POST OP (GAUZE/BANDAGES/DRESSINGS) ×2 IMPLANT

## 2023-03-14 NOTE — Progress Notes (Signed)
Orthopedic Tech Progress Note Patient Details:  Jade Hernandez 1941/02/28 [id number] CPM will be removed at 3:10pm.  CPM Right Knee CPM Right Knee: On Right Knee Flexion (Degrees): 40 Right Knee Extension (Degrees): 10  Post Interventions Patient Tolerated: Well  Jade Hernandez 03/14/2023, 11:14 AM

## 2023-03-14 NOTE — Anesthesia Procedure Notes (Signed)
Anesthesia Regional Block: Adductor canal block   Pre-Anesthetic Checklist: , timeout performed,  Correct Patient, Correct Site, Correct Laterality,  Correct Procedure, Correct Position, site marked,  Risks and benefits discussed,  Surgical consent,  Pre-op evaluation,  At surgeon's request and post-op pain management  Laterality: Right  Prep: chloraprep       Needles:  Injection technique: Single-shot  Needle Type: Echogenic Needle     Needle Length: 9cm  Needle Gauge: 21     Additional Needles:   Procedures:,,,, ultrasound used (permanent image in chart),,    Narrative:  Start time: 03/14/2023 8:30 AM End time: 03/14/2023 8:35 AM Injection made incrementally with aspirations every 5 mL.  Performed by: Personally  Anesthesiologist: Marcene Duos, MD

## 2023-03-14 NOTE — Anesthesia Procedure Notes (Signed)
Procedure Name: MAC Date/Time: 03/14/2023 9:04 AM  Performed by: Orest Dikes, CRNAPre-anesthesia Checklist: Patient identified, Emergency Drugs available, Suction available and Patient being monitored Oxygen Delivery Method: Simple face mask

## 2023-03-14 NOTE — Transfer of Care (Signed)
Immediate Anesthesia Transfer of Care Note  Patient: Jade Hernandez  Procedure(s) Performed: TOTAL KNEE ARTHROPLASTY (Right: Knee)  Patient Location: PACU  Anesthesia Type:Spinal  Level of Consciousness: awake, alert , and oriented  Airway & Oxygen Therapy: Patient Spontanous Breathing and Patient connected to face mask oxygen  Post-op Assessment: Report given to RN and Post -op Vital signs reviewed and stable  Post vital signs: Reviewed and stable  Last Vitals:  Vitals Value Taken Time  BP    Temp    Pulse 63 03/14/23 1040  Resp    SpO2      Last Pain:  Vitals:   03/14/23 0719  TempSrc: Oral  PainSc:          Complications: No notable events documented.

## 2023-03-14 NOTE — Op Note (Signed)
OPERATIVE REPORT-TOTAL KNEE ARTHROPLASTY   Pre-operative diagnosis- Osteoarthritis  Right knee(s)  Post-operative diagnosis- Osteoarthritis Right knee(s)  Procedure-  Right  Total Knee Arthroplasty  Surgeon- Gus Rankin. Ezriel Boffa, MD  Assistant- Arther Abbott, PA-C   Anesthesia-   Adductor canal block and spinal  EBL- 25 ml   Drains None  Tourniquet time- 31 minutes @ 300 mm Hg  Complications- None  Condition-PACU - hemodynamically stable.   Brief Clinical Note  Jade Hernandez is a 82 y.o. year old female with end stage OA of her right knee with progressively worsening pain and dysfunction. She has constant pain, with activity and at rest and significant functional deficits with difficulties even with ADLs. She has had extensive non-op management including analgesics, injections of cortisone and viscosupplements, and home exercise program, but remains in significant pain with significant dysfunction.Radiographs show bone on bone arthritis medial and patellofemoral. She presents now for right Total Knee Arthroplasty.     Procedure in detail---   The patient is brought into the operating room and positioned supine on the operating table. After successful administration of  Adductor canal block and spinal,   a tourniquet is placed high on the  Right thigh(s) and the lower extremity is prepped and draped in the usual sterile fashion. Time out is performed by the operating team and then the  Right lower extremity is wrapped in Esmarch, knee flexed and the tourniquet inflated to 300 mmHg.       A midline incision is made with a ten blade through the subcutaneous tissue to the level of the extensor mechanism. A fresh blade is used to make a medial parapatellar arthrotomy. Soft tissue over the proximal medial tibia is subperiosteally elevated to the joint line with a knife and into the semimembranosus bursa with a Cobb elevator. Soft tissue over the proximal lateral tibia is elevated with  attention being paid to avoiding the patellar tendon on the tibial tubercle. The patella is everted, knee flexed 90 degrees and the ACL and PCL are removed. Findings are bone on bone medial and patellofemoral with large global osteophytes        The drill is used to create a starting hole in the distal femur and the canal is thoroughly irrigated with sterile saline to remove the fatty contents. The 5 degree Right  valgus alignment guide is placed into the femoral canal and the distal femoral cutting block is pinned to remove 9 mm off the distal femur. Resection is made with an oscillating saw.      The tibia is subluxed forward and the menisci are removed. The extramedullary alignment guide is placed referencing proximally at the medial aspect of the tibial tubercle and distally along the second metatarsal axis and tibial crest. The block is pinned to remove 2mm off the more deficient medial  side. Resection is made with an oscillating saw. Size 5is the most appropriate size for the tibia and the proximal tibia is prepared with the modular drill and keel punch for that size.      The femoral sizing guide is placed and size 6 is most appropriate. Rotation is marked off the epicondylar axis and confirmed by creating a rectangular flexion gap at 90 degrees. The size 6 cutting block is pinned in this rotation and the anterior, posterior and chamfer cuts are made with the oscillating saw. The intercondylar block is then placed and that cut is made.      Trial size 5 tibial component, trial size  6 posterior stabilized femur and a 12  mm posterior stabilized rotating platform insert trial is placed. Full extension is achieved with excellent varus/valgus and anterior/posterior balance throughout full range of motion. The patella is everted and thickness measured to be 24  mm. Free hand resection is taken to 14 mm, a 38 template is placed, lug holes are drilled, trial patella is placed, and it tracks normally.  Osteophytes are removed off the posterior femur with the trial in place. All trials are removed and the cut bone surfaces prepared with pulsatile lavage. Cement is mixed and once ready for implantation, the size 5 tibial implant, size  6 posterior stabilized femoral component, and the size 38 patella are cemented in place and the patella is held with the clamp. The trial insert is placed and the knee held in full extension. The Exparel (20 ml mixed with 60 ml saline) is injected into the extensor mechanism, posterior capsule, medial and lateral gutters and subcutaneous tissues.  All extruded cement is removed and once the cement is hard the permanent 12 mm posterior stabilized rotating platform insert is placed into the tibial tray.      The wound is copiously irrigated with saline solution and the extensor mechanism closed with # 0 Stratofix suture. The tourniquet is released for a total tourniquet time of 31  minutes. Flexion against gravity is 140 degrees and the patella tracks normally. Subcutaneous tissue is closed with 2.0 vicryl and subcuticular with running 4.0 Monocryl. The incision is cleaned and dried and steri-strips and a bulky sterile dressing are applied. The limb is placed into a knee immobilizer and the patient is awakened and transported to recovery in stable condition.      Please note that a surgical assistant was a medical necessity for this procedure in order to perform it in a safe and expeditious manner. Surgical assistant was necessary to retract the ligaments and vital neurovascular structures to prevent injury to them and also necessary for proper positioning of the limb to allow for anatomic placement of the prosthesis.   Gus Rankin Benn Tarver, MD    03/14/2023, 10:19 AM

## 2023-03-14 NOTE — Evaluation (Signed)
Physical Therapy Evaluation Patient Details Name: Jade Hernandez MRN: [id number] DOB: 10-26-1941 Today's Date: 03/14/2023  History of Present Illness  82 yo female presents to therapy s/p R TKA on 03/14/2023 due to failure of conservative measures. Pt PMH includes but is not limited to: crohn's dz, chronic immunosuppressants, anemia, HDL, HTN, and L TKA (2018).  Clinical Impression    Jade STRADFORD is a 82 y.o. female POD 0 s/p R TKA. Patient reports mod I with SPC with mobility at baseline. Patient is now limited by functional impairments (see PT problem list below) and requires min A for R LE for bed mobility and min guard and cues for transfers. Patient was able to ambulate 40 feet with RW and min guard level of assist. Patient instructed in exercise to facilitate ROM and circulation to manage edema.  Patient will benefit from continued skilled PT interventions to address impairments and progress towards PLOF. Acute PT will follow to progress mobility and stair training in preparation for safe discharge home with family support and OPPT services.      Recommendations for follow up therapy are one component of a multi-disciplinary discharge planning process, led by the attending physician.  Recommendations may be updated based on patient status, additional functional criteria and insurance authorization.  Follow Up Recommendations       Assistance Recommended at Discharge Intermittent Supervision/Assistance  Patient can return home with the following  A little help with walking and/or transfers;A little help with bathing/dressing/bathroom;Assistance with cooking/housework;Assist for transportation;Help with stairs or ramp for entrance    Equipment Recommendations None recommended by PT (pt reports DME in home setting)  Recommendations for Other Services       Functional Status Assessment Patient has had a recent decline in their functional status and demonstrates the ability to make  significant improvements in function in a reasonable and predictable amount of time.     Precautions / Restrictions Precautions Precautions: Knee;Fall Restrictions Weight Bearing Restrictions: No      Mobility  Bed Mobility Overal bed mobility: Needs Assistance Bed Mobility: Supine to Sit     Supine to sit: Min assist     General bed mobility comments: for R LE to EOB, cues and HOB elevated    Transfers Overall transfer level: Needs assistance Equipment used: Rolling walker (2 wheels) Transfers: Sit to/from Stand Sit to Stand: Min guard           General transfer comment: cues for proper UE and AD placement    Ambulation/Gait Ambulation/Gait assistance: Min guard Gait Distance (Feet): 40 Feet Assistive device: Rolling walker (2 wheels) Gait Pattern/deviations: Step-to pattern, Leaning posteriorly Gait velocity: decreased     General Gait Details: lateral sway  Stairs            Wheelchair Mobility    Modified Rankin (Stroke Patients Only)       Balance Overall balance assessment: Needs assistance Sitting-balance support: Feet supported Sitting balance-Leahy Scale: Good     Standing balance support: Bilateral upper extremity supported, During functional activity, Reliant on assistive device for balance Standing balance-Leahy Scale: Poor                               Pertinent Vitals/Pain Pain Assessment Pain Assessment: 0-10 Pain Score: 6  Pain Location: R knee Pain Descriptors / Indicators: Aching, Constant, Grimacing, Operative site guarding Pain Intervention(s): Limited activity within patient's tolerance, Monitored during session, Premedicated before session,  Ice applied    Home Living Family/patient expects to be discharged to:: Private residence Living Arrangements: Spouse/significant other Available Help at Discharge: Family Type of Home: House Home Access: Stairs to enter Entrance Stairs-Rails: Can reach  both;Right;Left Entrance Stairs-Number of Steps: 3   Home Layout: Multi-level;Able to live on main level with bedroom/bathroom Home Equipment: Rolling Walker (2 wheels);BSC/3in1;Cane - single point      Prior Function Prior Level of Function : Independent/Modified Independent             Mobility Comments: mod I with SPC, ADLs, self care tasks, IADLs       Hand Dominance        Extremity/Trunk Assessment        Lower Extremity Assessment Lower Extremity Assessment: RLE deficits/detail RLE Deficits / Details: ankle DF 4/5 PF 5/5: AA for SLR RLE Sensation: WNL    Cervical / Trunk Assessment Cervical / Trunk Assessment: Normal  Communication   Communication: No difficulties  Cognition Arousal/Alertness: Awake/alert Behavior During Therapy: WFL for tasks assessed/performed Overall Cognitive Status: Within Functional Limits for tasks assessed                                          General Comments      Exercises Total Joint Exercises Ankle Circles/Pumps: AROM, 20 reps, Both   Assessment/Plan    PT Assessment Patient needs continued PT services  PT Problem List Decreased strength;Decreased range of motion;Decreased activity tolerance;Decreased balance;Decreased mobility;Decreased coordination;Decreased cognition;Pain       PT Treatment Interventions DME instruction;Gait training;Stair training;Functional mobility training;Therapeutic activities;Therapeutic exercise;Balance training;Neuromuscular re-education;Patient/family education;Modalities    PT Goals (Current goals can be found in the Care Plan section)  Acute Rehab PT Goals Patient Stated Goal: wants to be able to get back to standing to cook/bake, go up and down stairs without pain PT Goal Formulation: With patient Time For Goal Achievement: 03/28/23 Potential to Achieve Goals: Good    Frequency 7X/week     Co-evaluation               AM-PAC PT "6 Clicks" Mobility   Outcome Measure Help needed turning from your back to your side while in a flat bed without using bedrails?: A Little Help needed moving from lying on your back to sitting on the side of a flat bed without using bedrails?: A Little Help needed moving to and from a bed to a chair (including a wheelchair)?: A Little Help needed standing up from a chair using your arms (e.g., wheelchair or bedside chair)?: A Little Help needed to walk in hospital room?: A Little Help needed climbing 3-5 steps with a railing? : A Lot 6 Click Score: 17    End of Session Equipment Utilized During Treatment: Gait belt Activity Tolerance: No increased pain;Patient tolerated treatment well Patient left: in chair;with call bell/phone within reach;with family/visitor present;with nursing/sitter in room Nurse Communication: Mobility status PT Visit Diagnosis: Unsteadiness on feet (R26.81);Other abnormalities of gait and mobility (R26.89);Muscle weakness (generalized) (M62.81);Pain Pain - Right/Left: Right Pain - part of body: Knee    Time: 1341-1423 PT Time Calculation (min) (ACUTE ONLY): 42 min   Charges:   PT Evaluation $PT Eval Low Complexity: 1 Low PT Treatments $Gait Training: 8-22 mins $Therapeutic Activity: 8-22 mins        Rica Mote, PT   Jacqualyn Posey 03/14/2023, 2:30 PM

## 2023-03-14 NOTE — Interval H&P Note (Signed)
History and Physical Interval Note:  03/14/2023 6:56 AM  Jade Hernandez  has presented today for surgery, with the diagnosis of right knee osteoarthritis.  The various methods of treatment have been discussed with the patient and family. After consideration of risks, benefits and other options for treatment, the patient has consented to  Procedure(s): TOTAL KNEE ARTHROPLASTY (Right) as a surgical intervention.  The patient's history has been reviewed, patient examined, no change in status, stable for surgery.  I have reviewed the patient's chart and labs.  Questions were answered to the patient's satisfaction.     Homero Fellers Hasana Alcorta

## 2023-03-14 NOTE — Anesthesia Procedure Notes (Signed)
Spinal  Patient location during procedure: OR End time: 03/14/2023 9:11 AM Reason for block: surgical anesthesia Staffing Performed: resident/CRNA  Anesthesiologist: Marcene Duos, MD Resident/CRNA: Orest Dikes, CRNA Performed by: Orest Dikes, CRNA Authorized by: Marcene Duos, MD   Preanesthetic Checklist Completed: patient identified, IV checked, site marked, risks and benefits discussed, surgical consent, monitors and equipment checked, pre-op evaluation and timeout performed Spinal Block Patient position: sitting Prep: DuraPrep Patient monitoring: heart rate, cardiac monitor, continuous pulse ox and blood pressure Approach: midline Location: L3-4 Injection technique: single-shot Needle Needle type: Pencan  Needle gauge: 24 G Needle length: 10 cm Assessment Sensory level: T4 Events: CSF return Additional Notes IV functioning, monitors applied to pt. Expiration date of kit checked and confirmed to be in date. Sterile prep and drape, hand hygiene and sterile gloves used. Pt was positioned and spine was prepped in sterile fashion. Skin was anesthetized with lidocaine. Free flow of clear CSF obtained prior to injecting local anesthetic into CSF x 1 attempt. Spinal needle aspirated freely following injection. Needle was carefully withdrawn, and pt tolerated procedure well. Loss of motor and sensory on exam post injection. Time out prior to placement. Dr Sampson Goon at bedside during entire placement.

## 2023-03-14 NOTE — Anesthesia Postprocedure Evaluation (Signed)
Anesthesia Post Note  Patient: Jade Hernandez  Procedure(s) Performed: TOTAL KNEE ARTHROPLASTY (Right: Knee)     Patient location during evaluation: PACU Anesthesia Type: Spinal Level of consciousness: awake and alert Pain management: pain level controlled Vital Signs Assessment: post-procedure vital signs reviewed and stable Respiratory status: spontaneous breathing and respiratory function stable Cardiovascular status: blood pressure returned to baseline and stable Postop Assessment: spinal receding Anesthetic complications: no  No notable events documented.  Last Vitals:  Vitals:   03/14/23 1110 03/14/23 1340  BP:  (!) 123/52  Pulse: 62 69  Resp: 20 16  Temp:  (!) 36.3 C  SpO2: 100% 94%    Last Pain:  Vitals:   03/14/23 1340  TempSrc: Oral  PainSc:                  Kennieth Rad

## 2023-03-14 NOTE — Discharge Instructions (Addendum)
 Jade Aluisio, MD Total Joint Specialist [facility] [address] [place], [place] [address] [phone]  TOTAL KNEE REPLACEMENT POSTOPERATIVE DIRECTIONS    Knee Rehabilitation, Guidelines Following Surgery  Results after knee surgery are often greatly improved when you follow the exercise, range of motion and muscle strengthening exercises prescribed by your doctor. Safety measures are also important to protect the knee from further injury. If any of these exercises cause you to have increased pain or swelling in your knee joint, decrease the amount until you are comfortable again and slowly increase them. If you have problems or questions, call your caregiver or physical therapist for advice.   HOME CARE INSTRUCTIONS  Remove items at home which could result in a fall. This includes throw rugs or furniture in walking pathways.  ICE to the affected knee as much as tolerated. Icing helps control swelling. If the swelling is well controlled you will be more comfortable and rehab easier. Continue to use ice on the knee for pain and swelling from surgery. You may notice swelling that will progress down to the foot and ankle. This is normal after surgery. Elevate the leg when you are not up walking on it.    Continue to use the breathing machine which will help keep your temperature down. It is common for your temperature to cycle up and down following surgery, especially at night when you are not up moving around and exerting yourself. The breathing machine keeps your lungs expanded and your temperature down. Do not place pillow under the operative knee, focus on keeping the knee straight while resting  DIET You may resume your previous home diet once you are discharged from the hospital.  DRESSING / WOUND CARE / SHOWERING Keep your bulky bandage on for 2 days. On the third post-operative day you may remove the Ace bandage and gauze. There is a waterproof  adhesive bandage on your skin which will stay in place until your first follow-up appointment. Once you remove this you will not need to place another bandage You may begin showering 3 days following surgery, but do not submerge the incision under water.  ACTIVITY For the first 5 days, the key is rest and control of pain and swelling Do your home exercises twice a day starting on post-operative day 3. On the days you go to physical therapy, just do the home exercises once that day. You should rest, ice and elevate the leg for 50 minutes out of every hour. Get up and walk/stretch for 10 minutes per hour. After 5 days you can increase your activity slowly as tolerated. Walk with your walker as instructed. Use the walker until you are comfortable transitioning to a cane. Walk with the cane in the opposite hand of the operative leg. You may discontinue the cane once you are comfortable and walking steadily. Avoid periods of inactivity such as sitting longer than an hour when not asleep. This helps prevent blood clots.  You may discontinue the knee immobilizer once you are able to perform a straight leg raise while lying down. You may resume a sexual relationship in one month or when given the OK by your doctor.  You may return to work once you are cleared by your doctor.  Do not drive a car for 6 weeks or until released by your surgeon.  Do not drive while taking narcotics.  TED HOSE STOCKINGS Wear the elastic stockings on both legs for three weeks following surgery during the   day. You may remove them at night for sleeping.  WEIGHT BEARING Weight bearing as tolerated with assist device (walker, cane, etc) as directed, use it as long as suggested by your surgeon or therapist, typically at least 4-6 weeks.  POSTOPERATIVE CONSTIPATION PROTOCOL Constipation - defined medically as fewer than three stools per week and severe constipation as less than one stool per week.  One of the most common issues  patients have following surgery is constipation.  Even if you have a regular bowel pattern at home, your normal regimen is likely to be disrupted due to multiple reasons following surgery.  Combination of anesthesia, postoperative narcotics, change in appetite and fluid intake all can affect your bowels.  In order to avoid complications following surgery, here are some recommendations in order to help you during your recovery period.  Colace (docusate) - Pick up an over-the-counter form of Colace or another stool softener and take twice a day as long as you are requiring postoperative pain medications.  Take with a full glass of water daily.  If you experience loose stools or diarrhea, hold the colace until you stool forms back up. If your symptoms do not get better within 1 week or if they get worse, check with your doctor. Dulcolax (bisacodyl) - Pick up over-the-counter and take as directed by the product packaging as needed to assist with the movement of your bowels.  Take with a full glass of water.  Use this product as needed if not relieved by Colace only.  MiraLax (polyethylene glycol) - Pick up over-the-counter to have on hand. MiraLax is a solution that will increase the amount of water in your bowels to assist with bowel movements.  Take as directed and can mix with a glass of water, juice, soda, coffee, or tea. Take if you go more than two days without a movement. Do not use MiraLax more than once per day. Call your doctor if you are still constipated or irregular after using this medication for 7 days in a row.  If you continue to have problems with postoperative constipation, please contact the office for further assistance and recommendations.  If you experience "the worst abdominal pain ever" or develop nausea or vomiting, please contact the office immediatly for further recommendations for treatment.  ITCHING If you experience itching with your medications, try taking only a single pain  pill, or even half a pain pill at a time.  You can also use Benadryl over the counter for itching or also to help with sleep.   MEDICATIONS See your medication summary on the "After Visit Summary" that the nursing staff will review with you prior to discharge.  You may have some home medications which will be placed on hold until you complete the course of blood thinner medication.  It is important for you to complete the blood thinner medication as prescribed by your surgeon.  Continue your approved medications as instructed at time of discharge.  PRECAUTIONS If you experience chest pain or shortness of breath - call 911 immediately for transfer to the hospital emergency department.  If you develop a fever greater that 101 F, purulent drainage from wound, increased redness or drainage from wound, foul odor from the wound/dressing, or calf pain - CONTACT YOUR SURGEON.                                                     FOLLOW-UP APPOINTMENTS Make sure you keep all of your appointments after your operation with your surgeon and caregivers. You should call the office at the above phone number and make an appointment for approximately two weeks after the date of your surgery or on the date instructed by your surgeon outlined in the "After Visit Summary".  RANGE OF MOTION AND STRENGTHENING EXERCISES  Rehabilitation of the knee is important following a knee injury or an operation. After just a few days of immobilization, the muscles of the thigh which control the knee become weakened and shrink (atrophy). Knee exercises are designed to build up the tone and strength of the thigh muscles and to improve knee motion. Often times heat used for twenty to thirty minutes before working out will loosen up your tissues and help with improving the range of motion but do not use heat for the first two weeks following surgery. These exercises can be done on a training (exercise) mat, on the floor, on a table or on a bed.  Use what ever works the best and is most comfortable for you Knee exercises include:  Leg Lifts - While your knee is still immobilized in a splint or cast, you can do straight leg raises. Lift the leg to 60 degrees, hold for 3 sec, and slowly lower the leg. Repeat 10-20 times 2-3 times daily. Perform this exercise against resistance later as your knee gets better.  Quad and Hamstring Sets - Tighten up the muscle on the front of the thigh (Quad) and hold for 5-10 sec. Repeat this 10-20 times hourly. Hamstring sets are done by pushing the foot backward against an object and holding for 5-10 sec. Repeat as with quad sets.  Leg Slides: Lying on your back, slowly slide your foot toward your buttocks, bending your knee up off the floor (only go as far as is comfortable). Then slowly slide your foot back down until your leg is flat on the floor again. Angel Wings: Lying on your back spread your legs to the side as far apart as you can without causing discomfort.  A rehabilitation program following serious knee injuries can speed recovery and prevent re-injury in the future due to weakened muscles. Contact your doctor or a physical therapist for more information on knee rehabilitation.   POST-OPERATIVE OPIOID TAPER INSTRUCTIONS: It is important to wean off of your opioid medication as soon as possible. If you do not need pain medication after your surgery it is ok to stop day one. Opioids include: Codeine, Hydrocodone(Norco, Vicodin), Oxycodone(Percocet, oxycontin) and hydromorphone amongst others.  Long term and even short term use of opiods can cause: Increased pain response Dependence Constipation Depression Respiratory depression And more.  Withdrawal symptoms can include Flu like symptoms Nausea, vomiting And more Techniques to manage these symptoms Hydrate well Eat regular healthy meals Stay active Use relaxation techniques(deep breathing, meditating, yoga) Do Not substitute Alcohol to help  with tapering If you have been on opioids for less than two weeks and do not have pain than it is ok to stop all together.  Plan to wean off of opioids This plan should start within one week post op of your joint replacement. Maintain the same interval or time between taking each dose and first decrease the dose.  Cut the total daily intake of opioids by one tablet each day Next start to increase the time between doses. The last dose that should be eliminated is the evening dose.   IF YOU ARE TRANSFERRED TO   A SKILLED REHAB FACILITY If the patient is transferred to a skilled rehab facility following release from the hospital, a list of the current medications will be sent to the facility for the patient to continue.  When discharged from the skilled rehab facility, please have the facility set up the patient's Home Health Physical Therapy prior to being released. Also, the skilled facility will be responsible for providing the patient with their medications at time of release from the facility to include their pain medication, the muscle relaxants, and their blood thinner medication. If the patient is still at the rehab facility at time of the two week follow up appointment, the skilled rehab facility will also need to assist the patient in arranging follow up appointment in our office and any transportation needs.  MAKE SURE YOU:  Understand these instructions.  Get help right away if you are not doing well or get worse.   DENTAL ANTIBIOTICS:  In most cases prophylactic antibiotics for Dental procdeures after total joint surgery are not necessary.  Exceptions are as follows:  1. History of prior total joint infection  2. Severely immunocompromised (Organ Transplant, cancer chemotherapy, Rheumatoid biologic meds such as Humera)  3. Poorly controlled diabetes (A1C &gt; 8.0, blood glucose over 200)  If you have one of these conditions, contact your surgeon for an antibiotic prescription,  prior to your dental procedure.    Pick up stool softner and laxative for home use following surgery while on pain medications. Do not submerge incision under water. Please use good hand washing techniques while changing dressing each day. May shower starting three days after surgery. Please use a clean towel to pat the incision dry following showers. Continue to use ice for pain and swelling after surgery. Do not use any lotions or creams on the incision until instructed by your surgeon.  ++++++++++++++++++++++++++++++++++++++++++++++++++++++++++++++++++++++++++  Information on my medicine - XARELTO (Rivaroxaban)  Why was Xarelto prescribed for you? Xarelto was prescribed for you to reduce the risk of blood clots forming after orthopedic surgery. The medical term for these abnormal blood clots is venous thromboembolism (VTE).  What do you need to know about xarelto ? Take your Xarelto ONCE DAILY at the same time every day. You may take it either with or without food.  If you have difficulty swallowing the tablet whole, you may crush it and mix in applesauce just prior to taking your dose.  Take Xarelto exactly as prescribed by your doctor and DO NOT stop taking Xarelto without talking to the doctor who prescribed the medication.  Stopping without other VTE prevention medication to take the place of Xarelto may increase your risk of developing a clot.  After discharge, you should have regular check-up appointments with your healthcare provider that is prescribing your Xarelto.    What do you do if you miss a dose? If you miss a dose, take it as soon as you remember on the same day then continue your regularly scheduled once daily regimen the next day. Do not take two doses of Xarelto on the same day.   Important Safety Information A possible side effect of Xarelto is bleeding. You should call your healthcare provider right away if you experience any of the  following: Bleeding from an injury or your nose that does not stop. Unusual colored urine (red or dark brown) or unusual colored stools (red or black). Unusual bruising for unknown reasons. A serious fall or if you hit your head (even if there is no  bleeding).  Some medicines may interact with Xarelto and might increase your risk of bleeding while on Xarelto. To help avoid this, consult your healthcare provider or pharmacist prior to using any new prescription or non-prescription medications, including herbals, vitamins, non-steroidal anti-inflammatory drugs (NSAIDs) and supplements.  This website has more information on Xarelto: VisitDestination.com.br.

## 2023-03-14 NOTE — Care Plan (Signed)
Ortho Bundle Case Management Note  Patient Details  Name: Jade Hernandez MRN: [id number] Date of Birth: 08-Oct-1941                  R TKA on 03/14/23 DCP: Home with children and husband  DME: No needs, has a RW  PT: Wales Brassfield   DME Arranged:  N/A DME Agency:       Additional Comments: Please contact me with any questions of if this plan should need to change.   Ennis Forts, RN,CCM [facility]  [phone] 03/14/2023, 1:07 PM

## 2023-03-15 ENCOUNTER — Encounter (HOSPITAL_COMMUNITY): Payer: Self-pay | Admitting: Orthopedic Surgery

## 2023-03-15 DIAGNOSIS — Z79899 Other long term (current) drug therapy: Secondary | ICD-10-CM | POA: Diagnosis not present

## 2023-03-15 DIAGNOSIS — I10 Essential (primary) hypertension: Secondary | ICD-10-CM | POA: Diagnosis not present

## 2023-03-15 DIAGNOSIS — Z96652 Presence of left artificial knee joint: Secondary | ICD-10-CM | POA: Diagnosis not present

## 2023-03-15 DIAGNOSIS — M1711 Unilateral primary osteoarthritis, right knee: Secondary | ICD-10-CM | POA: Diagnosis not present

## 2023-03-15 LAB — CBC
HCT: 31.4 % — ABNORMAL LOW (ref 36.0–46.0)
Hemoglobin: 10 g/dL — ABNORMAL LOW (ref 12.0–15.0)
MCH: 26.4 pg (ref 26.0–34.0)
MCHC: 31.8 g/dL (ref 30.0–36.0)
MCV: 82.8 fL (ref 80.0–100.0)
Platelets: 174 10*3/uL (ref 150–400)
RBC: 3.79 MIL/uL — ABNORMAL LOW (ref 3.87–5.11)
RDW: 14.6 % (ref 11.5–15.5)
WBC: 12.7 10*3/uL — ABNORMAL HIGH (ref 4.0–10.5)
nRBC: 0 % (ref 0.0–0.2)

## 2023-03-15 LAB — BASIC METABOLIC PANEL
Anion gap: 6 (ref 5–15)
BUN: 11 mg/dL (ref 8–23)
CO2: 24 mmol/L (ref 22–32)
Calcium: 8.4 mg/dL — ABNORMAL LOW (ref 8.9–10.3)
Chloride: 106 mmol/L (ref 98–111)
Creatinine, Ser: 0.49 mg/dL (ref 0.44–1.00)
GFR, Estimated: >60 mL/min (ref >60)
Glucose, Bld: 129 mg/dL — ABNORMAL HIGH (ref 70–99)
Potassium: 4.2 mmol/L (ref 3.5–5.1)
Sodium: 136 mmol/L (ref 135–145)

## 2023-03-15 MED ORDER — METHOCARBAMOL 500 MG PO TABS: 500.0000 mg | ORAL_TABLET | Freq: Four times a day (QID) | ORAL | 0 refills | Status: DC | PRN

## 2023-03-15 MED ORDER — SODIUM CHLORIDE 0.9 % IV BOLUS
500.0000 mL | Freq: Once | INTRAVENOUS | Status: AC
  Administered 2023-03-15: 500 mL via INTRAVENOUS

## 2023-03-15 MED ORDER — TRAMADOL HCL 50 MG PO TABS: 50.0000 mg | ORAL_TABLET | Freq: Four times a day (QID) | ORAL | 0 refills | Status: DC | PRN

## 2023-03-15 MED ORDER — OXYCODONE HCL 5 MG PO TABS: 5.0000 mg | ORAL_TABLET | Freq: Four times a day (QID) | ORAL | 0 refills | Status: DC | PRN

## 2023-03-15 MED ORDER — RIVAROXABAN 10 MG PO TABS: 10.0000 mg | ORAL_TABLET | Freq: Every day | ORAL | 0 refills | Status: AC

## 2023-03-15 NOTE — TOC Transition Note (Signed)
Transition of Care [facility]) - CM/SW Discharge Note   Patient Details  Name: MATTIE NORDELL MRN: [id number] Date of Birth: 01/06/1941  Transition of Care [facility]) CM/SW Contact:  Amada Jupiter, LCSW Phone Number: 03/15/2023, 10:10 AM   Clinical Narrative:     Met with pt and confirming she has needed DME at home.  OPPT already arranged at [facility]).  No TOC needs.  Final next level of care: OP Rehab Barriers to Discharge: No Barriers Identified   Patient Goals and CMS Choice      Discharge Placement                         Discharge Plan and Services Additional resources added to the After Visit Summary for                  DME Arranged: N/A DME Agency: NA                  Social Determinants of Health (SDOH) Interventions SDOH Screenings   Food Insecurity: No Food Insecurity (03/14/2023)  Housing: Low Risk  (03/14/2023)  Transportation Needs: No Transportation Needs (03/14/2023)  Utilities: Not At Risk (03/14/2023)  Tobacco Use: Low Risk  (03/15/2023)     Readmission Risk Interventions     No data to display

## 2023-03-15 NOTE — Progress Notes (Signed)
Subjective: 1 Day Post-Op Procedure(s) (LRB): TOTAL KNEE ARTHROPLASTY (Right) Patient reports pain as mild.   Patient seen in rounds by Dr. Lequita Halt. Patient is well, and has had no acute complaints or problems. States she would like to go home later today if possible. Denies chest pain, SOB, or calf pain. Foley catheter removed this AM.  We will continue therapy today, ambulated 40' yesterday.   Objective: Vital signs in last 24 hours: Temp:  [97.3 F (36.3 C)-98.3 F (36.8 C)] 97.9 F (36.6 C) (04/30 0604) Pulse Rate:  [59-76] 63 (04/30 0822) Resp:  [12-20] 18 (04/30 0604) BP: (101-138)/(52-68) 113/55 (04/30 0822) SpO2:  [94 %-100 %] 95 % (04/30 0604)  Intake/Output from previous day:  Intake/Output Summary (Last 24 hours) at 03/15/2023 0838 Last data filed at 03/15/2023 4098 Gross per 24 hour  Intake 3530 ml  Output 3025 ml  Net 505 ml     Intake/Output this shift: Total I/O In: 240 [P.O.:240] Out: -   Labs: Recent Labs    03/15/23 0343  HGB 10.0*   Recent Labs    03/15/23 0343  WBC 12.7*  RBC 3.79*  HCT 31.4*  PLT 174   Recent Labs    03/15/23 0343  NA 136  K 4.2  CL 106  CO2 24  BUN 11  CREATININE 0.49  GLUCOSE 129*  CALCIUM 8.4*   No results for input(s): "LABPT", "INR" in the last 72 hours.  Exam: General - Patient is Alert and Oriented Extremity - Neurologically intact Neurovascular intact Sensation intact distally Dorsiflexion/Plantar flexion intact Dressing - dressing C/D/I Motor Function - intact, moving foot and toes well on exam.   Past Medical History:  Diagnosis Date   Adenomatous colon polyp    Arthritis    Crohn's disease of both small and large intestine with other complication (HCC) 10/23/2020   Diverticulosis    High cholesterol    Hypertension    IBS (irritable bowel syndrome)    Palpitations    Pre-diabetes    UTI (urinary tract infection)     Assessment/Plan: 1 Day Post-Op Procedure(s) (LRB): TOTAL KNEE  ARTHROPLASTY (Right) Principal Problem:   Osteoarthritis of knee Active Problems:   Primary osteoarthritis of right knee  Estimated body mass index is 30.46 kg/m as calculated from the following:   Height as of this encounter: 5\' 3"  (1.6 m).   Weight as of this encounter: 78 kg. Advance diet Up with therapy   Patient's anticipated LOS is less than 2 midnights, meeting these requirements: - Lives within 1 hour of care - Has a competent adult at home to recover with post-op recover - NO history of  - Chronic pain requiring opiods  - Diabetes  - Coronary Artery Disease  - Heart failure  - Heart attack  - Stroke  - DVT/VTE  - Cardiac arrhythmia  - Respiratory Failure/COPD  - Renal failure  - Anemia  - Advanced Liver disease  DVT Prophylaxis - Xarelto Weight bearing as tolerated. Continue therapy.  BP soft this morning, holding antihypertensives with exception of metoprolol. Additionally ordering 500 mL bolus. Will see how she does with PT, but should not impede on discharge.   Plan is to go Home after hospital stay. Plan for discharge later today if progresses with therapy and meeting goals. Scheduled for OPPT at [facility]). Follow-up in the office in 2 weeks.  The PDMP database was reviewed today prior to any opioid medications being prescribed to this patient.  Arther Abbott,  PA-C Orthopedic Surgery [phone] 03/15/2023, 8:38 AM

## 2023-03-15 NOTE — Plan of Care (Signed)

## 2023-03-15 NOTE — Progress Notes (Signed)
Physical Therapy Treatment Patient Details Name: Jade Hernandez MRN: [id number] DOB: 1941/08/15 Today's Date: 03/15/2023   History of Present Illness 82 yo female presents to therapy s/p R TKA on 03/14/2023 due to failure of conservative measures. Pt PMH includes but is not limited to: crohn's dz, chronic immunosuppressants, anemia, HDL, HTN, and L TKA (2018).    PT Comments    2nd session to practice stair negotiation. Pt c/o increased pain this afternoon-rated 6/10. Encouraged pt to ambulate often at home, as tolerated. Also encouraged her to perform quad sets and ankle pumps, 10 reps throughout the day. All PT education completed. Pt seems undecided about whether she wants to d/c home on today. Encouraged her to rest for a bit to allow pain level to come down. Asked RN to check in with her in a bit to see if she feels comfortable going home today.     Recommendations for follow up therapy are one component of a multi-disciplinary discharge planning process, led by the attending physician.  Recommendations may be updated based on patient status, additional functional criteria and insurance authorization.  Follow Up Recommendations       Assistance Recommended at Discharge Intermittent Supervision/Assistance  Patient can return home with the following A little help with walking and/or transfers;A little help with bathing/dressing/bathroom;Assistance with cooking/housework;Assist for transportation;Help with stairs or ramp for entrance   Equipment Recommendations  None recommended by PT    Recommendations for Other Services       Precautions / Restrictions Precautions Precautions: Knee;Fall Restrictions Weight Bearing Restrictions: No Other Position/Activity Restrictions: WBAT     Mobility  Bed Mobility Overal bed mobility: Needs Assistance Bed Mobility: Sit to Supine     Supine to sit: Min guard, HOB elevated Sit to supine: Min assist   General bed mobility comments:  Assist for R LE. Increased time.    Transfers Overall transfer level: Needs assistance Equipment used: Rolling walker (2 wheels) Transfers: Sit to/from Stand Sit to Stand: Min guard           General transfer comment: Min guard A for safety. Cues for safety, hand placement.    Ambulation/Gait Ambulation/Gait assistance: Min guard Gait Distance (Feet): 75 Feet Assistive device: Rolling walker (2 wheels) Gait Pattern/deviations: Step-through pattern, Decreased stride length, Decreased step length - right       General Gait Details: Intermittent assist to steady. Cues for safety, sequencing, L side step length (to not take such a large step). Pt using reciprocal pattern throughout.   Stairs Stairs: Yes Stairs assistance: Min guard Stair Management: Step to pattern, Forwards, Two rails Number of Stairs: 2 General stair comments: up and over portable stairs x 1. cues for safety, sequence, technique.   Wheelchair Mobility    Modified Rankin (Stroke Patients Only)       Balance Overall balance assessment: Needs assistance         Standing balance support: Bilateral upper extremity supported, During functional activity, Reliant on assistive device for balance Standing balance-Leahy Scale: Fair                              Cognition Arousal/Alertness: Awake/alert Behavior During Therapy: WFL for tasks assessed/performed Overall Cognitive Status: Within Functional Limits for tasks assessed  Exercises Total Joint Exercises Ankle Circles/Pumps: AROM, Both, 10 reps Quad Sets: AROM, Right, 10 reps Hip ABduction/ADduction: AROM, Right, 10 reps Straight Leg Raises: AROM, Right, 10 reps Knee Flexion: AROM, Right, 10 reps, Seated Goniometric ROM: ~10-70 degrees    General Comments        Pertinent Vitals/Pain Pain Assessment Pain Assessment: 0-10 Pain Score: 6  Pain Location: R  knee/thigh Pain Descriptors / Indicators: Aching, Grimacing Pain Intervention(s): Limited activity within patient's tolerance, Monitored during session, Ice applied, Repositioned    Home Living                          Prior Function            PT Goals (current goals can now be found in the care plan section) Progress towards PT goals: Progressing toward goals    Frequency    7X/week      PT Plan Current plan remains appropriate    Co-evaluation              AM-PAC PT "6 Clicks" Mobility   Outcome Measure  Help needed turning from your back to your side while in a flat bed without using bedrails?: None Help needed moving from lying on your back to sitting on the side of a flat bed without using bedrails?: None Help needed moving to and from a bed to a chair (including a wheelchair)?: A Little Help needed standing up from a chair using your arms (e.g., wheelchair or bedside chair)?: A Little Help needed to walk in hospital room?: A Little Help needed climbing 3-5 steps with a railing? : A Little 6 Click Score: 20    End of Session Equipment Utilized During Treatment: Gait belt Activity Tolerance: Patient tolerated treatment well Patient left: in bed;with call bell/phone within reach   PT Visit Diagnosis: Unsteadiness on feet (R26.81);Other abnormalities of gait and mobility (R26.89);Muscle weakness (generalized) (M62.81);Pain Pain - Right/Left: Right Pain - part of body: Knee     Time: 1610-9604 PT Time Calculation (min) (ACUTE ONLY): 21 min  Charges:  $Gait Training: 8-22 mins                         Faye Ramsay, PT Acute Rehabilitation  Office: [phone]

## 2023-03-15 NOTE — Progress Notes (Signed)
Physical Therapy Treatment Patient Details Name: Jade Hernandez MRN: [id number] DOB: [birth year] Today's Date: 03/15/2023   History of Present Illness 82 yo female presents to therapy s/p R TKA on 03/14/2023 due to failure of conservative measures. Pt PMH includes but is not limited to: crohn's dz, chronic immunosuppressants, anemia, HDL, HTN, and L TKA (2018).    PT Comments    Pt agreeable to therapy. Moderate pain with activity. Will plan to have a 2nd session prior to d/c home later today.    Recommendations for follow up therapy are one component of a multi-disciplinary discharge planning process, led by the attending physician.  Recommendations may be updated based on patient status, additional functional criteria and insurance authorization.  Follow Up Recommendations       Assistance Recommended at Discharge Intermittent Supervision/Assistance  Patient can return home with the following A little help with walking and/or transfers;A little help with bathing/dressing/bathroom;Assistance with cooking/housework;Assist for transportation;Help with stairs or ramp for entrance   Equipment Recommendations  None recommended by PT    Recommendations for Other Services       Precautions / Restrictions Precautions Precautions: Knee;Fall Restrictions Weight Bearing Restrictions: No     Mobility  Bed Mobility Overal bed mobility: Needs Assistance Bed Mobility: Supine to Sit     Supine to sit: Min guard, HOB elevated     General bed mobility comments: Min guard for safety.    Transfers Overall transfer level: Needs assistance Equipment used: Rolling walker (2 wheels) Transfers: Sit to/from Stand Sit to Stand: Min guard           General transfer comment: Min guard a for safety. Cues for safety, hand placement.    Ambulation/Gait Ambulation/Gait assistance: Min assist Gait Distance (Feet): 75 Feet Assistive device: Rolling walker (2 wheels) Gait  Pattern/deviations: Step-through pattern, Decreased stride length, Decreased step length - right       General Gait Details: Intermittent assist to steady. Cues for safety, sequencing. Pt using reciprocal pattern throughout.   Stairs             Wheelchair Mobility    Modified Rankin (Stroke Patients Only)       Balance Overall balance assessment: Needs assistance         Standing balance support: Bilateral upper extremity supported, During functional activity, Reliant on assistive device for balance Standing balance-Leahy Scale: Poor                              Cognition Arousal/Alertness: Awake/alert Behavior During Therapy: WFL for tasks assessed/performed Overall Cognitive Status: Within Functional Limits for tasks assessed                                          Exercises Total Joint Exercises Ankle Circles/Pumps: AROM, Both, 10 reps Quad Sets: AROM, Right, 10 reps Hip ABduction/ADduction: AROM, Right, 10 reps Straight Leg Raises: AROM, Right, 10 reps Knee Flexion: AROM, Right, 10 reps, Seated Goniometric ROM: ~10-70 degrees    General Comments        Pertinent Vitals/Pain Pain Assessment Pain Assessment: 0-10 Pain Score: 6  Pain Location: R knee/thigh Pain Descriptors / Indicators: Aching Pain Intervention(s): Limited activity within patient's tolerance, Monitored during session, Ice applied, Repositioned    Home Living  Prior Function            PT Goals (current goals can now be found in the care plan section) Progress towards PT goals: Progressing toward goals    Frequency    7X/week      PT Plan Current plan remains appropriate    Co-evaluation              AM-PAC PT "6 Clicks" Mobility   Outcome Measure  Help needed turning from your back to your side while in a flat bed without using bedrails?: None Help needed moving from lying on your back to  sitting on the side of a flat bed without using bedrails?: None Help needed moving to and from a bed to a chair (including a wheelchair)?: A Little Help needed standing up from a chair using your arms (e.g., wheelchair or bedside chair)?: A Little Help needed to walk in hospital room?: A Little Help needed climbing 3-5 steps with a railing? : A Little 6 Click Score: 20    End of Session Equipment Utilized During Treatment: Gait belt Activity Tolerance: Patient tolerated treatment well Patient left: in chair;with call bell/phone within reach   PT Visit Diagnosis: Unsteadiness on feet (R26.81);Other abnormalities of gait and mobility (R26.89);Muscle weakness (generalized) (M62.81);Pain Pain - Right/Left: Right Pain - part of body: Knee     Time: 1610-9604 PT Time Calculation (min) (ACUTE ONLY): 21 min  Charges:  $Gait Training: 8-22 mins                        Faye Ramsay, PT Acute Rehabilitation  Office: [phone]

## 2023-03-16 DIAGNOSIS — Z96652 Presence of left artificial knee joint: Secondary | ICD-10-CM | POA: Diagnosis not present

## 2023-03-16 DIAGNOSIS — M1711 Unilateral primary osteoarthritis, right knee: Secondary | ICD-10-CM | POA: Diagnosis not present

## 2023-03-16 DIAGNOSIS — Z79899 Other long term (current) drug therapy: Secondary | ICD-10-CM | POA: Diagnosis not present

## 2023-03-16 DIAGNOSIS — I10 Essential (primary) hypertension: Secondary | ICD-10-CM | POA: Diagnosis not present

## 2023-03-16 LAB — CBC
HCT: 28.9 % — ABNORMAL LOW (ref 36.0–46.0)
Hemoglobin: 9.4 g/dL — ABNORMAL LOW (ref 12.0–15.0)
MCH: 26.9 pg (ref 26.0–34.0)
MCHC: 32.5 g/dL (ref 30.0–36.0)
MCV: 82.6 fL (ref 80.0–100.0)
Platelets: 164 10*3/uL (ref 150–400)
RBC: 3.5 MIL/uL — ABNORMAL LOW (ref 3.87–5.11)
RDW: 14.9 % (ref 11.5–15.5)
WBC: 12 10*3/uL — ABNORMAL HIGH (ref 4.0–10.5)
nRBC: 0 % (ref 0.0–0.2)

## 2023-03-16 NOTE — Progress Notes (Signed)
The patient is alert and oriented and has been seen by her physician. The orders for discharge were written. IV has been removed. Went over discharge instructions with patient and family. She is being discharged via wheelchair with all of her belongings.  

## 2023-03-16 NOTE — Progress Notes (Signed)
Physical Therapy Treatment Patient Details Name: Jade Hernandez MRN: [id number] DOB: 1941/08/24 Today's Date: 03/16/2023   History of Present Illness 82 yo female presents to therapy s/p R TKA on 03/14/2023 due to failure of conservative measures. Pt PMH includes but is not limited to: crohn's dz, chronic immunosuppressants, anemia, HDL, HTN, and L TKA (2018).    PT Comments    Pt agreeable to therapy. She continues to report increased pain. Reviewed/practiced exercises, gait training, and stair training. Encouraged pt to try to ambulate often at home, as tolerated. All PT education completed.     Recommendations for follow up therapy are one component of a multi-disciplinary discharge planning process, led by the attending physician.  Recommendations may be updated based on patient status, additional functional criteria and insurance authorization.  Follow Up Recommendations       Assistance Recommended at Discharge Intermittent Supervision/Assistance  Patient can return home with the following A little help with walking and/or transfers;A little help with bathing/dressing/bathroom;Assistance with cooking/housework;Assist for transportation;Help with stairs or ramp for entrance   Equipment Recommendations  None recommended by PT    Recommendations for Other Services       Precautions / Restrictions Precautions Precautions: Knee;Fall Restrictions Weight Bearing Restrictions: No Other Position/Activity Restrictions: WBAT     Mobility  Bed Mobility               General bed mobility comments: oob in recliner    Transfers Overall transfer level: Needs assistance Equipment used: Rolling walker (2 wheels) Transfers: Sit to/from Stand Sit to Stand: Min guard           General transfer comment: Min guard A for safety. Cues for safety, hand placement.    Ambulation/Gait Ambulation/Gait assistance: Min guard Gait Distance (Feet): 75 Feet Assistive device: Rolling  walker (2 wheels) Gait Pattern/deviations: Step-through pattern, Decreased stride length, Decreased step length - right       General Gait Details: Intermittent brief standing rest breaks due to pt reporting UEs sore/tired. Cues for pt to try to put more weight on legs, sequencing. No LOB with RW. Step to pattern on today likely due to increased pain.   Stairs   Stairs assistance: Min guard Stair Management: Step to pattern, Forwards, Two rails Number of Stairs: 2 General stair comments: up and over portable stairs x 1. cues for safety, sequence, technique.   Wheelchair Mobility    Modified Rankin (Stroke Patients Only)       Balance Overall balance assessment: Needs assistance         Standing balance support: Bilateral upper extremity supported, During functional activity, Reliant on assistive device for balance Standing balance-Leahy Scale: Fair                              Cognition Arousal/Alertness: Awake/alert Behavior During Therapy: WFL for tasks assessed/performed Overall Cognitive Status: Within Functional Limits for tasks assessed                                          Exercises Total Joint Exercises Ankle Circles/Pumps: AROM, 10 reps, Both Quad Sets: AROM, Right, 10 reps Hip ABduction/ADduction: AAROM, Right, 10 reps Straight Leg Raises: AAROM, Right, 10 reps    General Comments        Pertinent Vitals/Pain Pain Assessment Pain Assessment: Faces Faces Pain Scale:  Hurts even more Pain Location: R knee/thigh Pain Descriptors / Indicators: Aching, Grimacing Pain Intervention(s): Limited activity within patient's tolerance, Monitored during session, Ice applied, Repositioned    Home Living                          Prior Function            PT Goals (current goals can now be found in the care plan section) Progress towards PT goals: Progressing toward goals    Frequency    7X/week      PT  Plan Current plan remains appropriate    Co-evaluation              AM-PAC PT "6 Clicks" Mobility   Outcome Measure  Help needed turning from your back to your side while in a flat bed without using bedrails?: A Little Help needed moving from lying on your back to sitting on the side of a flat bed without using bedrails?: A Little Help needed moving to and from a bed to a chair (including a wheelchair)?: A Little Help needed standing up from a chair using your arms (e.g., wheelchair or bedside chair)?: A Little Help needed to walk in hospital room?: A Little Help needed climbing 3-5 steps with a railing? : A Little 6 Click Score: 18    End of Session Equipment Utilized During Treatment: Gait belt Activity Tolerance: Patient tolerated treatment well;Patient limited by fatigue;Patient limited by pain Patient left: in chair;with call bell/phone within reach   PT Visit Diagnosis: Unsteadiness on feet (R26.81);Other abnormalities of gait and mobility (R26.89);Muscle weakness (generalized) (M62.81);Pain Pain - Right/Left: Right Pain - part of body: Knee     Time: 1610-9604 PT Time Calculation (min) (ACUTE ONLY): 36 min  Charges:  $Gait Training: 8-22 mins $Therapeutic Exercise: 8-22 mins                        Faye Ramsay, PT Acute Rehabilitation  Office: [phone]

## 2023-03-16 NOTE — Progress Notes (Signed)
   Subjective: 2 Days Post-Op Procedure(s) (LRB): TOTAL KNEE ARTHROPLASTY (Right) Patient seen in rounds by Dr. Lequita Halt. Patient is well, and has had no acute complaints or problems. Denies SOB or chest pain. Patient reports pain as moderate.  Had an increase in pain last night but feeling better this morning. Worked with physical therapy yesterday and ambulated 75'.  Objective: Vital signs in last 24 hours: Temp:  [97.7 F (36.5 C)-99.1 F (37.3 C)] 99.1 F (37.3 C) (05/01 0612) Pulse Rate:  [63-77] 77 (05/01 0612) Resp:  [15-17] 15 (05/01 0612) BP: (113-170)/(54-86) 145/61 (05/01 0612) SpO2:  [93 %-98 %] 93 % (05/01 0612)  Intake/Output from previous day:  Intake/Output Summary (Last 24 hours) at 03/16/2023 0812 Last data filed at 03/16/2023 0600 Gross per 24 hour  Intake 2164.2 ml  Output 1150 ml  Net 1014.2 ml    Intake/Output this shift: No intake/output data recorded.  Labs: Recent Labs    03/15/23 0343 03/16/23 0354  HGB 10.0* 9.4*   Recent Labs    03/15/23 0343 03/16/23 0354  WBC 12.7* 12.0*  RBC 3.79* 3.50*  HCT 31.4* 28.9*  PLT 174 164   Recent Labs    03/15/23 0343  NA 136  K 4.2  CL 106  CO2 24  BUN 11  CREATININE 0.49  GLUCOSE 129*  CALCIUM 8.4*   No results for input(s): "LABPT", "INR" in the last 72 hours.  Exam: General - Patient is Alert and Oriented Extremity - Neurologically intact Neurovascular intact Sensation intact distally Dorsiflexion/Plantar flexion intact Dressing/Incision - clean, dry, no drainage Motor Function - intact, moving foot and toes well on exam.  Past Medical History:  Diagnosis Date   Adenomatous colon polyp    Arthritis    Crohn's disease of both small and large intestine with other complication (HCC) 10/23/2020   Diverticulosis    High cholesterol    Hypertension    IBS (irritable bowel syndrome)    Palpitations    Pre-diabetes    UTI (urinary tract infection)     Assessment/Plan: 2 Days Post-Op  Procedure(s) (LRB): TOTAL KNEE ARTHROPLASTY (Right) Principal Problem:   Osteoarthritis of knee Active Problems:   Primary osteoarthritis of right knee  Estimated body mass index is 30.46 kg/m as calculated from the following:   Height as of this encounter: 5\' 3"  (1.6 m).   Weight as of this encounter: 78 kg.  DVT Prophylaxis - Xarelto Weight-bearing as tolerated.  Passed physical therapy yesterday but stayed additional night due to pain. Expected discharge today. Scheduled for OPPT at [facility]. Follow-up in clinic in 2 weeks.  Alfonzo Feller, PA-C Orthopedic Surgery [phone] 03/16/2023, 8:12 AM

## 2023-03-16 NOTE — Plan of Care (Signed)
  Problem: Education: Goal: Knowledge of General Education information will improve Description Including pain rating scale, medication(s)/side effects and non-pharmacologic comfort measures Outcome: Progressing   

## 2023-03-17 ENCOUNTER — Ambulatory Visit: Payer: Medicare Other | Attending: Orthopedic Surgery

## 2023-03-17 ENCOUNTER — Other Ambulatory Visit: Payer: Self-pay

## 2023-03-17 DIAGNOSIS — R252 Cramp and spasm: Secondary | ICD-10-CM | POA: Diagnosis not present

## 2023-03-17 DIAGNOSIS — R262 Difficulty in walking, not elsewhere classified: Secondary | ICD-10-CM | POA: Diagnosis not present

## 2023-03-17 DIAGNOSIS — M25661 Stiffness of right knee, not elsewhere classified: Secondary | ICD-10-CM | POA: Diagnosis not present

## 2023-03-17 DIAGNOSIS — M25561 Pain in right knee: Secondary | ICD-10-CM | POA: Diagnosis not present

## 2023-03-17 DIAGNOSIS — M6281 Muscle weakness (generalized): Secondary | ICD-10-CM | POA: Diagnosis not present

## 2023-03-17 NOTE — Discharge Summary (Signed)
Physician Discharge Summary   Patient ID: CODA FILLER MRN: [id number] DOB/AGE: 05/19/1941 82 y.o.  Admit date: 03/14/2023 Discharge date: 03/16/2023  Primary Diagnosis: Osteoarthritis right knee    Admission Diagnoses:  Past Medical History:  Diagnosis Date   Adenomatous colon polyp    Arthritis    Crohn's disease of both small and large intestine with other complication (HCC) 10/23/2020   Diverticulosis    High cholesterol    Hypertension    IBS (irritable bowel syndrome)    Palpitations    Pre-diabetes    UTI (urinary tract infection)    Discharge Diagnoses:   Principal Problem:   Osteoarthritis of knee Active Problems:   Primary osteoarthritis of right knee  Estimated body mass index is 30.46 kg/m as calculated from the following:   Height as of this encounter: 5\' 3"  (1.6 m).   Weight as of this encounter: 78 kg.  Procedure:  Procedure(s) (LRB): TOTAL KNEE ARTHROPLASTY (Right)   Consults: None  HPI: Jade Hernandez is a 82 y.o. year old female with end stage OA of her right knee with progressively worsening pain and dysfunction. She has constant pain, with activity and at rest and significant functional deficits with difficulties even with ADLs. She has had extensive non-op management including analgesics, injections of cortisone and viscosupplements, and home exercise program, but remains in significant pain with significant dysfunction.Radiographs show bone on bone arthritis medial and patellofemoral. She presents now for right Total Knee Arthroplasty.  Laboratory Data: Admission on 03/14/2023, Discharged on 03/16/2023  Component Date Value Ref Range Status   WBC 03/15/2023 12.7 (H)  4.0 - 10.5 K/uL Final   RBC 03/15/2023 3.79 (L)  3.87 - 5.11 MIL/uL Final   Hemoglobin 03/15/2023 10.0 (L)  12.0 - 15.0 g/dL Final   HCT 40/98/1191 31.4 (L)  36.0 - 46.0 % Final   MCV 03/15/2023 82.8  80.0 - 100.0 fL Final   MCH 03/15/2023 26.4  26.0 - 34.0 pg Final   MCHC  03/15/2023 31.8  30.0 - 36.0 g/dL Final   RDW 47/82/9562 14.6  11.5 - 15.5 % Final   Platelets 03/15/2023 174  150 - 400 K/uL Final   nRBC 03/15/2023 0.0  0.0 - 0.2 % Final   Performed at [facility], [address], [place], [place] [address]   Sodium 03/15/2023 136  135 - 145 mmol/L Final   Potassium 03/15/2023 4.2  3.5 - 5.1 mmol/L Final   Chloride 03/15/2023 106  98 - 111 mmol/L Final   CO2 03/15/2023 24  22 - 32 mmol/L Final   Glucose, Bld 03/15/2023 129 (H)  70 - 99 mg/dL Final   Glucose reference range applies only to samples taken after fasting for at least 8 hours.   BUN 03/15/2023 11  8 - 23 mg/dL Final   Creatinine, Ser 03/15/2023 0.49  0.44 - 1.00 mg/dL Final   Calcium 57/84/6962 8.4 (L)  8.9 - 10.3 mg/dL Final   GFR, Estimated 03/15/2023 >60  >60 mL/min Final   Comment: (NOTE) Calculated using the CKD-EPI Creatinine Equation (2021)    Anion gap 03/15/2023 6  5 - 15 Final   Performed at [facility], [address] [place], [place] [address]   WBC 03/16/2023 12.0 (H)  4.0 - 10.5 K/uL Final   RBC 03/16/2023 3.50 (L)  3.87 - 5.11 MIL/uL Final   Hemoglobin 03/16/2023 9.4 (L)  12.0 - 15.0 g/dL Final   HCT 13/24/4010 28.9 (L)  36.0 - 46.0 % Final   MCV 03/16/2023 82.6  80.0 - 100.0 fL Final   MCH 03/16/2023 26.9  26.0 - 34.0 pg Final   MCHC 03/16/2023 32.5  30.0 - 36.0 g/dL Final   RDW 16/08/9603 14.9  11.5 - 15.5 % Final   Platelets 03/16/2023 164  150 - 400 K/uL Final   nRBC 03/16/2023 0.0  0.0 - 0.2 % Final   Performed at [facility], [address], [place], [place] [address]  Hospital Outpatient Visit on 03/02/2023  Component Date Value Ref Range Status   MRSA, PCR 03/02/2023 NEGATIVE  NEGATIVE Final   Staphylococcus aureus 03/02/2023 NEGATIVE  NEGATIVE Final   Comment: (NOTE) The Xpert SA Assay (FDA approved for NASAL specimens in patients 21 years of age and older), is one component of a  comprehensive surveillance program. It is not intended to diagnose infection nor to guide or monitor treatment. Performed at [facility], [address] [place], [place] [address]    Sodium 03/02/2023 138  135 - 145 mmol/L Final   Potassium 03/02/2023 4.1  3.5 - 5.1 mmol/L Final   Chloride 03/02/2023 107  98 - 111 mmol/L Final   CO2 03/02/2023 23  22 - 32 mmol/L Final   Glucose, Bld 03/02/2023 98  70 - 99 mg/dL Final   Glucose reference range applies only to samples taken after fasting for at least 8 hours.   BUN 03/02/2023 14  8 - 23 mg/dL Final   Creatinine, Ser 03/02/2023 0.46  0.44 - 1.00 mg/dL Final   Calcium 78/29/5621 8.9  8.9 - 10.3 mg/dL Final   GFR, Estimated 03/02/2023 >60  >60 mL/min Final   Comment: (NOTE) Calculated using the CKD-EPI Creatinine Equation (2021)    Anion gap 03/02/2023 8  5 - 15 Final   Performed at [facility], [address], [place], [place] [address]   WBC 03/02/2023 5.4  4.0 - 10.5 K/uL Final   RBC 03/02/2023 4.61  3.87 - 5.11 MIL/uL Final   Hemoglobin 03/02/2023 12.0  12.0 - 15.0 g/dL Final   HCT 78/46/9629 38.7  36.0 - 46.0 % Final   MCV 03/02/2023 83.9  80.0 - 100.0 fL Final   MCH 03/02/2023 26.0  26.0 - 34.0 pg Final   MCHC 03/02/2023 31.0  30.0 - 36.0 g/dL Final   RDW 52/84/1324 14.7  11.5 - 15.5 % Final   Platelets 03/02/2023 224  150 - 400 K/uL Final   nRBC 03/02/2023 0.0  0.0 - 0.2 % Final   Performed at [facility], [address], [place], [place] [address]   Glucose-Capillary 03/02/2023 106 (H)  70 - 99 mg/dL Final   Glucose reference range applies only to samples taken after fasting for at least 8 hours.   Hgb A1c MFr Bld 03/02/2023 5.3  4.8 - 5.6 % Final   Comment: (NOTE) Pre diabetes:          5.7%-6.4%  Diabetes:              >6.4%  Glycemic control for   <7.0% adults with diabetes    Mean Plasma Glucose 03/02/2023 105.41  mg/dL Final   Performed at [facility], [address] [place], [place] [address]  Appointment on 02/07/2023  Component Date Value Ref Range Status   Area-P 1/2 02/07/2023 7.59  cm2 Final   S' Lateral 02/07/2023 2.60  cm Final   P 1/2 time  02/07/2023 391  msec Final   Est EF 02/07/2023 60 - 65%   Final     X-Rays:No results found.  EKG: Orders placed or performed in visit on 02/17/23   EKG 12-Lead     Hospital Course: Jade Hernandez is a 82 y.o. who was admitted to [facility] They were brought to the operating room on 03/14/2023 and underwent Procedure(s): TOTAL KNEE ARTHROPLASTY.  Patient tolerated the procedure well and was later transferred to the recovery room and then to the orthopaedic floor for postoperative care. They were given PO and IV analgesics for pain control following their surgery. They were given 24 hours of postoperative antibiotics of  Anti-infectives (From admission, onward)    Start     Dose/Rate Route Frequency Ordered Stop   03/14/23 1500  ceFAZolin (ANCEF) IVPB 2g/100 mL premix        2 g 200 mL/hr over 30 Minutes Intravenous Every 6 hours 03/14/23 1144 03/14/23 2107   03/14/23 0715  ceFAZolin (ANCEF) IVPB 2g/100 mL premix        2 g 200 mL/hr over 30 Minutes Intravenous On call to O.R. 03/14/23 2956 03/14/23 0912     and started on DVT prophylaxis in the form of Xarelto.   PT and OT were ordered for total joint protocol. Discharge planning consulted to help with post-op disposition and equipment needs. Patient had a fair night on the evening of surgery. They started to get up OOB with physical therapy on POD #0. Continued to work with physical therapy into POD #2. Patient was seen during rounds on day two and was ready to go home pending progress with physical therapy. Patient worked with physical therapy for a total of 4 sessions and was meeting their goals. Dressing was changed and the incision was C/D/I.  They were discharged home later that day in stable condition.  Diet:  Regular diet Activity: WBAT Follow-up: in 2 weeks Disposition: Home Discharged Condition: stable   Discharge Instructions     Call MD / Call 911   Complete by: As directed    If you experience chest pain or shortness of breath, CALL 911 and be transported to the hospital emergency room.  If you develope a fever above 101 F, pus (white drainage) or increased drainage or redness at the wound, or calf pain, call your surgeon's office.   Change dressing   Complete by: As directed    You may remove the bulky bandage (ACE wrap and gauze) two days after surgery. You will have an adhesive waterproof bandage underneath. Leave this in place until your first follow-up appointment.   Constipation Prevention   Complete by: As directed    Drink plenty of fluids.  Prune juice may be helpful.  You may use a stool softener, such as Colace (over the counter) 100 mg twice a day.  Use MiraLax (over the counter) for constipation as needed.   Diet - low sodium heart healthy   Complete by: As directed    Do not put a pillow under the knee. Place it under the heel.   Complete by: As directed    Driving restrictions   Complete by: As directed    No driving for two weeks   Post-operative opioid taper instructions:   Complete by: As directed    POST-OPERATIVE OPIOID TAPER INSTRUCTIONS: It is important to wean off of your opioid medication as soon as possible. If you do not need pain medication after your surgery  it is ok to stop day one. Opioids include: Codeine, Hydrocodone(Norco, Vicodin), Oxycodone(Percocet, oxycontin) and hydromorphone amongst others.  Long term and even short term use of opiods can cause: Increased pain response Dependence Constipation Depression Respiratory depression And more.  Withdrawal symptoms can include Flu like symptoms Nausea, vomiting And more Techniques to manage these symptoms Hydrate well Eat regular healthy meals Stay active Use relaxation techniques(deep  breathing, meditating, yoga) Do Not substitute Alcohol to help with tapering If you have been on opioids for less than two weeks and do not have pain than it is ok to stop all together.  Plan to wean off of opioids This plan should start within one week post op of your joint replacement. Maintain the same interval or time between taking each dose and first decrease the dose.  Cut the total daily intake of opioids by one tablet each day Next start to increase the time between doses. The last dose that should be eliminated is the evening dose.      TED hose   Complete by: As directed    Use stockings (TED hose) for three weeks on both leg(s).  You may remove them at night for sleeping.   Weight bearing as tolerated   Complete by: As directed       Allergies as of 03/16/2023       Reactions   Feraheme [ferumoxytol] Shortness Of Breath, Palpitations   Severe pain all over        Medication List     STOP taking these medications    Vitamin D (Ergocalciferol) 50 MCG (2000 UT) Caps       TAKE these medications    acetaminophen 500 MG tablet Commonly known as: TYLENOL Take 1,000 mg by mouth in the morning and at bedtime.   amLODipine 2.5 MG tablet Commonly known as: NORVASC Take 1 tablet (2.5 mg total) by mouth daily.   methocarbamol 500 MG tablet Commonly known as: ROBAXIN Take 1 tablet (500 mg total) by mouth every 6 (six) hours as needed for muscle spasms.   metoprolol succinate 100 MG 24 hr tablet Commonly known as: Toprol XL Take 1 tablet (100 mg total) by mouth daily. Take with or immediately following a meal.   oxyCODONE 5 MG immediate release tablet Commonly known as: Oxy IR/ROXICODONE Take 1-2 tablets (5-10 mg total) by mouth every 6 (six) hours as needed for severe pain.   PROBIOTIC DAILY PO Take 1 capsule by mouth in the morning.   rivaroxaban 10 MG Tabs tablet Commonly known as: XARELTO Take 1 tablet (10 mg total) by mouth daily with breakfast for 20  days.   simvastatin 40 MG tablet Commonly known as: ZOCOR Take 40 mg by mouth every evening.   sodium chloride 0.9 % SOLN 250 mL with vedolizumab 300 MG SOLR 300 mg Inject 300 mg into the vein every 8 (eight) weeks.   traMADol 50 MG tablet Commonly known as: ULTRAM Take 1-2 tablets (50-100 mg total) by mouth every 6 (six) hours as needed for moderate pain. What changed:  how much to take when to take this reasons to take this   valsartan 160 MG tablet Commonly known as: DIOVAN Take 1 tablet (160 mg total) by mouth daily.               Discharge Care Instructions  (From admission, onward)           Start     Ordered   03/15/23 0000  Weight  bearing as tolerated        03/15/23 0842   03/15/23 0000  Change dressing       Comments: You may remove the bulky bandage (ACE wrap and gauze) two days after surgery. You will have an adhesive waterproof bandage underneath. Leave this in place until your first follow-up appointment.   03/15/23 1610            Follow-up Information     Ollen Gross, MD. Go on 03/29/2023.   Specialty: Orthopedic Surgery Why: You are scheduled for first post op appt on Tuesday May 14 at 3:00pm. Contact information: [address] [place] [address] [phone]                 Signed: R. [place] Jansky, PA-C Orthopedic Surgery 03/17/2023, 7:34 AM

## 2023-03-17 NOTE — Therapy (Signed)
OUTPATIENT PHYSICAL THERAPY LOWER EXTREMITY EVALUATION   Patient Name: Jade Hernandez MRN: [id number] DOB:[birth year], 82 y.o., female Today's Date: 03/18/2023  END OF SESSION:  PT End of Session - 03/17/23 1410     Visit Number 1    Date for PT Re-Evaluation 05/12/23    Authorization Type MEDICARE PART A AND B    Progress Note Due on Visit 10    PT Start Time 1400    PT Stop Time 1450    PT Time Calculation (min) 50 min    Activity Tolerance Patient tolerated treatment well;Patient limited by fatigue;Patient limited by pain    Behavior During Therapy [facility] for tasks assessed/performed             Past Medical History:  Diagnosis Date   Adenomatous colon polyp    Arthritis    Crohn's disease of both small and large intestine with other complication (HCC) 10/23/2020   Diverticulosis    High cholesterol    Hypertension    IBS (irritable bowel syndrome)    Palpitations    Pre-diabetes    UTI (urinary tract infection)    Past Surgical History:  Procedure Laterality Date   BILATERAL CARPAL TUNNEL RELEASE     COLONOSCOPY     NM MYOCAR PERF WALL MOTION  10/02/2007   No significant ischemia demonstrated   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   Patient Active Problem List   Diagnosis Date Noted   Primary osteoarthritis of right knee 03/14/2023   Long-term use of immunosuppressant medication _ Entyvio 02/16/2021   Iron deficiency anemia due to chronic blood loss 11/25/2020   Crohn's  disease of both small and large intestine with other complication (HCC) 10/23/2020   OA (osteoarthritis) of knee 02/21/2017   History of palpitations 04/22/2015   Hyperlipidemia 01/11/2014   GERD (gastroesophageal reflux disease) 01/11/2014   Family history of coronary artery disease 01/11/2014   Osteoarthritis of knee 06/17/2012   Hypertension     PCP: [name], MD   REFERRING PROVIDER: Ollen Gross, MD  REFERRING DIAG: [phone] (ICD-10-CM) - Presence of right artificial knee joint  THERAPY DIAG:  Acute pain of right knee - Plan: PT plan of care cert/re-cert  Stiffness of right knee, not elsewhere classified - Plan: PT plan of care cert/re-cert  Difficulty in walking, not elsewhere classified - Plan: PT plan of care cert/re-cert  Muscle weakness (generalized) - Plan: PT plan of care cert/re-cert  Cramp and spasm - Plan: PT plan of care cert/re-cert  Rationale for Evaluation and Treatment: Rehabilitation  ONSET DATE: 02/16/2023  SUBJECTIVE:   SUBJECTIVE STATEMENT: Right TKA 03/14/23.  Patient arrives with her niece.  She is 4 days post op. She reports pain at 6/10 at the moment.  She admits she is quite fatigued.  She is having trouble sleeping at night and has not felt like eating much.  She enjoys being with her family and is usually up and going most of the day.  She does not exercise on a regular basis but is  very mobile throughout the day.  She hopes to be able to get back to doing her usual level of mobility and function.   PERTINENT HISTORY: Hx of left TKA PAIN:  Are you having pain?  Yes, 6/10  PRECAUTIONS: None  WEIGHT BEARING RESTRICTIONS: No  FALLS:  Has patient fallen in last 6 months? No  LIVING ENVIRONMENT: Lives with: lives with their spouse Lives in: House/apartment Stairs: Yes: Internal: 12 steps; on right going up and External: 5 steps; on right going up Has following equipment at home: Single point cane and Walker - 2  wheeled  OCCUPATION: Retired  PLOF: Independent, Independent with basic ADLs, Independent with household mobility without device, Independent with community mobility without device, Independent with homemaking with ambulation, Independent with gait, and Independent with transfers  PATIENT GOALS: To return to her prior level of function  NEXT MD VISIT: 2 weeks  OBJECTIVE:   DIAGNOSTIC FINDINGS: na  PATIENT SURVEYS:  FOTO complete next visit  COGNITION: Overall cognitive status: Within functional limits for tasks assessed     SENSATION: WFL  EDEMA:  Circumferential: Mid patella  POSTURE: No Significant postural limitations  PALPATION: Tender over anterior knee and lateral knee  LOWER EXTREMITY ROM:  Active ROM Right eval Left eval  Knee flexion 48   Knee extension -10    (Blank rows = not tested)  LOWER EXTREMITY MMT: (held on initial eval due to 4 days post op)  MMT Right eval Left eval  Hip external rotation    Knee flexion    Knee extension     (Blank rows = not tested)   FUNCTIONAL TESTS:  5 times sit to stand: complete next visit - patient had a lot of questions and we did not get to this Timed up and go (TUG): complete next visit  GAIT: Distance walked: 30 Assistive device utilized: Walker - 2 wheeled Level of assistance: SBA Comments: antalgic, slow and guarded   TODAY'S TREATMENT:                                                                                                                              DATE: 03/14/23  Initial eval completed and initiated HEP   PATIENT EDUCATION:  Education details: Initiated HEP and discussed pain control Person educated: Patient Education method: Medical illustrator Education comprehension: verbalized understanding, returned demonstration, and verbal cues required  HOME EXERCISE PROGRAM: Access Code: [facility] URL: [email or web address] Date: 03/17/2023 Prepared by: Mikey Kirschner  Exercises - Seated Knee Extension Stretch with Chair  - 1 x daily - 7 x weekly - 1 sets - 1 reps - work up to 30 min hold - Supine Knee Extension Stretch on Towel Roll  - 1 x daily - 7 x weekly - 1 sets - 1 reps - work up to 30 min hold - Long Sitting Quad Set  - 1 x daily - 7 x weekly - 3 sets - 10 reps -  Supine Heel Slide  - 1 x daily - 7 x weekly - 3 sets - 10 reps - Supine Heel Slide with Strap  - 1 x daily - 7 x weekly - 3 sets - 10 reps  ASSESSMENT:  CLINICAL IMPRESSION: Patient is a 82 y.o. female who was seen today for physical therapy evaluation and treatment for post op right TKA.  She is 4 days post op.  She presents with decreased ROM, strength and function along with elevated pain.  She should respond well to post TKA protocol.   OBJECTIVE IMPAIRMENTS: Abnormal gait, decreased balance, decreased endurance, difficulty walking, decreased ROM, decreased strength, increased edema, increased fascial restrictions, increased muscle spasms, impaired flexibility, and pain.   ACTIVITY LIMITATIONS: carrying, lifting, bending, sitting, standing, squatting, sleeping, stairs, transfers, bed mobility, bathing, toileting, dressing, and hygiene/grooming  PARTICIPATION LIMITATIONS: meal prep, cleaning, laundry, interpersonal relationship, driving, shopping, community activity, occupation, yard work, and church  PERSONAL FACTORS: Age, Fitness, and 1-2 comorbidities: Crohn's, OA, Htn  are also affecting patient's functional outcome.   REHAB POTENTIAL: Good  CLINICAL DECISION MAKING: Stable/uncomplicated  EVALUATION COMPLEXITY: Low   GOALS: Goals reviewed with patient? Yes  SHORT TERM GOALS: Target date: 04/14/2023  Pain report to be no greater than 4/10  Baseline: Goal status: INITIAL  2.  Patient will be independent with initial HEP  Baseline:  Goal status: INITIAL  3.  Extension ROM to improve to 0 Baseline:  Goal status: INITIAL  4.  Flexion ROM to improve to 90  degrees Baseline:  Goal status: INITIAL  5.  Patient to be able to walk with walker with step through gait Baseline:  Goal status: INITIAL   LONG TERM GOALS: Target date: 05/12/2023   Patient to be independent with advanced HEP  Baseline:  Goal status: INITIAL  2.  Patient to report pain no greater than 2/10  Baseline:  Goal status: INITIAL  3.  Functional ROM of left knee (no > -5 degrees ext and at least 110 degrees of flexion) Baseline:  Goal status: INITIAL  4.  FOTO to improve to predicted score Baseline:  Goal status: INITIAL  5.  Patient to be able to stand or walk for at least 15 min without a.d. as reported by patient  Baseline:  Goal status: INITIAL  6.  Functional scores to improve by 5-10 sec Baseline:  Goal status: INITIAL   PLAN:  PT FREQUENCY: 2x/week  PT DURATION: 8 weeks  PLANNED INTERVENTIONS: Therapeutic exercises, Therapeutic activity, Neuromuscular re-education, Balance training, Gait training, Patient/Family education, Self Care, Joint mobilization, Stair training, DME instructions, Aquatic Therapy, Dry Needling, Electrical stimulation, Cryotherapy, Moist heat, Taping, Vasopneumatic device, Manual therapy, and Re-evaluation  PLAN FOR NEXT SESSION: Nustep, review HEP, quad rehab, ROM   Medard Decuir B. Valmore Arabie, PT 03/18/23 7:12 AM [facility] [address] [place], [place] [address] Phone # [phone] Fax [phone]

## 2023-03-21 ENCOUNTER — Ambulatory Visit: Payer: Medicare Other

## 2023-03-21 DIAGNOSIS — R262 Difficulty in walking, not elsewhere classified: Secondary | ICD-10-CM | POA: Diagnosis not present

## 2023-03-21 DIAGNOSIS — R252 Cramp and spasm: Secondary | ICD-10-CM | POA: Diagnosis not present

## 2023-03-21 DIAGNOSIS — M6281 Muscle weakness (generalized): Secondary | ICD-10-CM

## 2023-03-21 DIAGNOSIS — M25561 Pain in right knee: Secondary | ICD-10-CM | POA: Diagnosis not present

## 2023-03-21 DIAGNOSIS — M25661 Stiffness of right knee, not elsewhere classified: Secondary | ICD-10-CM

## 2023-03-21 NOTE — Therapy (Signed)
OUTPATIENT PHYSICAL THERAPY LOWER EXTREMITY EVALUATION   Patient Name: Jade Hernandez MRN: [id number] DOB:[birth year], 82 y.o., female Today's Date: 03/21/2023  END OF SESSION:  PT End of Session - 03/21/23 1529     Visit Number 2    Date for PT Re-Evaluation 05/12/23    Authorization Type MEDICARE PART A AND B    Progress Note Due on Visit 10    PT Start Time 1446    PT Stop Time 1540    PT Time Calculation (min) 54 min    Activity Tolerance Patient tolerated treatment well;No increased pain    Behavior During Therapy WFL for tasks assessed/performed              Past Medical History:  Diagnosis Date   Adenomatous colon polyp    Arthritis    Crohn's disease of both small and large intestine with other complication (HCC) 10/23/2020   Diverticulosis    High cholesterol    Hypertension    IBS (irritable bowel syndrome)    Palpitations    Pre-diabetes    UTI (urinary tract infection)    Past Surgical History:  Procedure Laterality Date   BILATERAL CARPAL TUNNEL RELEASE     COLONOSCOPY     NM MYOCAR PERF WALL MOTION  10/02/2007   No significant ischemia demonstrated   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   Patient Active Problem List   Diagnosis Date Noted   Primary osteoarthritis of right knee 03/14/2023   Long-term use of immunosuppressant medication _ Entyvio 02/16/2021   Iron deficiency anemia due to chronic blood loss 11/25/2020   Crohn's disease of both small and large  intestine with other complication (HCC) 10/23/2020   OA (osteoarthritis) of knee 02/21/2017   History of palpitations 04/22/2015   Hyperlipidemia 01/11/2014   GERD (gastroesophageal reflux disease) 01/11/2014   Family history of coronary artery disease 01/11/2014   Osteoarthritis of knee 06/17/2012   Hypertension     PCP: [name], MD   REFERRING PROVIDER: Ollen Gross, MD  REFERRING DIAG: [phone] (ICD-10-CM) - Presence of right artificial knee joint  THERAPY DIAG:  Acute pain of right knee  Stiffness of right knee, not elsewhere classified  Difficulty in walking, not elsewhere classified  Muscle weakness (generalized)  Cramp and spasm  Rationale for Evaluation and Treatment: Rehabilitation  ONSET DATE: 02/16/2023  SUBJECTIVE:   SUBJECTIVE STATEMENT: I am doing well with my exercises.  I am using the walker all the time.    PERTINENT HISTORY: Hx of left TKA PAIN:  PAIN:  Are you having pain? Yes NPRS scale: 4/10 Pain location: Rt knee  Pain description: constant and aching  Aggravating factors: standing, bending knee, activity  Relieving factors: ice, pain medication   PRECAUTIONS: None  WEIGHT BEARING RESTRICTIONS: No  FALLS:  Has patient fallen in last 6 months? No  LIVING ENVIRONMENT: Lives with: lives with their spouse Lives in: House/apartment Stairs: Yes: Internal: 12 steps; on right going up and External: 5 steps; on right going up Has following equipment  at home: Single point cane and Walker - 2 wheeled  OCCUPATION: Retired  PLOF: Independent, Independent with basic ADLs, Independent with household mobility without device, Independent with community mobility without device, Independent with homemaking with ambulation, Independent with gait, and Independent with transfers  PATIENT GOALS: To return to her prior level of function  NEXT MD VISIT: 2 weeks  OBJECTIVE:   DIAGNOSTIC FINDINGS: na  PATIENT SURVEYS:  FOTO complete next  visit  COGNITION: Overall cognitive status: Within functional limits for tasks assessed     SENSATION: WFL  EDEMA:  Circumferential: Mid patella  POSTURE: No Significant postural limitations  PALPATION: Tender over anterior knee and lateral knee  LOWER EXTREMITY ROM:  Active ROM Right eval Left eval  Knee flexion 48   Knee extension -10    (Blank rows = not tested)  LOWER EXTREMITY MMT: (held on initial eval due to 4 days post op)  MMT Right eval Left eval  Hip external rotation    Knee flexion    Knee extension     (Blank rows = not tested)   FUNCTIONAL TESTS:  03/21/23: 5 times sit to stand: 21.3 seconds  Timed up and go (TUG): 36.48 second with walker   GAIT: Distance walked: 30 Assistive device utilized: Environmental consultant - 2 wheeled Level of assistance: SBA Comments: antalgic, slow and guarded   TODAY'S TREATMENT:           DATE: 03/21/23  NuStep: level 2x 6 minutes-PT present to discuss progress  5x sit to stand and TUG measures  Hamstring curls seated with red band 2x10 on Rt  Heel slides in sitting with overpressure x10 Long arc quads x10 Supine heel slidesx5 Quad sets 5" hold  Short arc quads: 5" hold x10 Game Ready: med compression, 3 snowflakes x10 min to Rt knee                                                                                                                     DATE: 03/14/23  Initial eval completed and initiated HEP   PATIENT EDUCATION:  Education details: Initiated HEP and discussed pain control Person educated: Patient Education method: Medical illustrator Education comprehension: verbalized understanding, returned demonstration, and verbal cues required  HOME EXERCISE PROGRAM: Access Code: [facility] URL: [email or web address] Date: 03/17/2023 Prepared by: Mikey Kirschner  Exercises - Seated Knee Extension Stretch with Chair  - 1 x daily - 7 x weekly - 1 sets - 1 reps - work up to 30 min hold - Supine  Knee Extension Stretch on Towel Roll  - 1 x daily - 7 x weekly - 1 sets - 1 reps - work up to 30 min hold - Long Sitting Quad Set  - 1 x daily - 7 x weekly - 3 sets - 10 reps - Supine Heel Slide  - 1 x daily - 7 x weekly - 3 sets - 10 reps - Supine Heel Slide with Strap  - 1 x daily - 7 x weekly -  3 sets - 10 reps  ASSESSMENT:  CLINICAL IMPRESSION: First time follow-up after initial evaluation.  Pt has been doing well with HEP.  Pt continues to use walker for all ambulation.  Reduced time spent on Rt LE with gait with walker and PT provided verbal cues for symmetry.  Baseline TUG and 5x sit to stand measures taken today.  She presents with decreased ROM, strength and function along with elevated pain.  She should respond well to post TKA protocol.   OBJECTIVE IMPAIRMENTS: Abnormal gait, decreased balance, decreased endurance, difficulty walking, decreased ROM, decreased strength, increased edema, increased fascial restrictions, increased muscle spasms, impaired flexibility, and pain.   ACTIVITY LIMITATIONS: carrying, lifting, bending, sitting, standing, squatting, sleeping, stairs, transfers, bed mobility, bathing, toileting, dressing, and hygiene/grooming  PARTICIPATION LIMITATIONS: meal prep, cleaning, laundry, interpersonal relationship, driving, shopping, community activity, occupation, yard work, and church  PERSONAL FACTORS: Age, Fitness, and 1-2 comorbidities: Crohn's, OA, Htn  are also affecting patient's functional outcome.   REHAB POTENTIAL: Good  CLINICAL DECISION MAKING: Stable/uncomplicated  EVALUATION COMPLEXITY: Low   GOALS: Goals reviewed with patient? Yes  SHORT TERM GOALS: Target date: 04/14/2023  Pain report to be no greater than 4/10  Baseline: Goal status: INITIAL  2.  Patient will be independent with initial HEP  Baseline:  Goal status: INITIAL  3.  Extension ROM to improve to 0 Baseline:  Goal status: INITIAL  4.  Flexion ROM to improve to 90  degrees Baseline:  Goal status: INITIAL  5.  Patient to be able to walk with walker with step through gait Baseline:  Goal status: INITIAL   LONG TERM GOALS: Target date: 05/12/2023   Patient to be independent with advanced HEP  Baseline:  Goal status: INITIAL  2.  Patient to report pain no greater than 2/10  Baseline:  Goal status: INITIAL  3.  Functional ROM of left knee (no > -5 degrees ext and at least 110 degrees of flexion) Baseline:  Goal status: INITIAL  4.  FOTO to improve to predicted score Baseline:  Goal status: INITIAL  5.  Patient to be able to stand or walk for at least 15 min without a.d. as reported by patient  Baseline:  Goal status: INITIAL  6.  Functional scores to improve by 5-10 sec Baseline:  Goal status: INITIAL   PLAN:  PT FREQUENCY: 2x/week  PT DURATION: 8 weeks  PLANNED INTERVENTIONS: Therapeutic exercises, Therapeutic activity, Neuromuscular re-education, Balance training, Gait training, Patient/Family education, Self Care, Joint mobilization, Stair training, DME instructions, Aquatic Therapy, Dry Needling, Electrical stimulation, Cryotherapy, Moist heat, Taping, Vasopneumatic device, Manual therapy, and Re-evaluation  PLAN FOR NEXT SESSION: Nustep, review HEP, quad rehab, ROM, gait training, Game Ready   Lorrene Reid, PT 03/21/23 3:32 PM  [facility] [address] [place], [place] [address] Phone # [phone] Fax [phone]

## 2023-03-23 ENCOUNTER — Ambulatory Visit: Payer: Medicare Other

## 2023-03-23 DIAGNOSIS — R262 Difficulty in walking, not elsewhere classified: Secondary | ICD-10-CM | POA: Diagnosis not present

## 2023-03-23 DIAGNOSIS — M25661 Stiffness of right knee, not elsewhere classified: Secondary | ICD-10-CM

## 2023-03-23 DIAGNOSIS — M6281 Muscle weakness (generalized): Secondary | ICD-10-CM | POA: Diagnosis not present

## 2023-03-23 DIAGNOSIS — M25561 Pain in right knee: Secondary | ICD-10-CM

## 2023-03-23 DIAGNOSIS — R252 Cramp and spasm: Secondary | ICD-10-CM | POA: Diagnosis not present

## 2023-03-23 NOTE — Therapy (Signed)
OUTPATIENT PHYSICAL THERAPY LOWER EXTREMITY EVALUATION   Patient Name: Jade Hernandez MRN: [id number] DOB:09-12-1941, 82 y.o., female Today's Date: 03/23/2023  END OF SESSION:  PT End of Session - 03/23/23 1409     Visit Number 3    Date for PT Re-Evaluation 05/12/23    Authorization Type MEDICARE PART A AND B    PT Start Time 1358    PT Stop Time 1502    PT Time Calculation (min) 64 min    Activity Tolerance Patient tolerated treatment well;No increased pain    Behavior During Therapy WFL for tasks assessed/performed              Past Medical History:  Diagnosis Date   Adenomatous colon polyp    Arthritis    Crohn's disease of both small and large intestine with other complication (HCC) 10/23/2020   Diverticulosis    High cholesterol    Hypertension    IBS (irritable bowel syndrome)    Palpitations    Pre-diabetes    UTI (urinary tract infection)    Past Surgical History:  Procedure Laterality Date   BILATERAL CARPAL TUNNEL RELEASE     COLONOSCOPY     NM MYOCAR PERF WALL MOTION  10/02/2007   No significant ischemia demonstrated   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   Patient Active Problem List   Diagnosis Date Noted   Primary osteoarthritis of right knee 03/14/2023   Long-term use of immunosuppressant medication _ Entyvio 02/16/2021   Iron deficiency anemia due to chronic blood loss 11/25/2020   Crohn's disease of both small and large intestine with other complication (HCC)  10/23/2020   OA (osteoarthritis) of knee 02/21/2017   History of palpitations 04/22/2015   Hyperlipidemia 01/11/2014   GERD (gastroesophageal reflux disease) 01/11/2014   Family history of coronary artery disease 01/11/2014   Osteoarthritis of knee 06/17/2012   Hypertension     PCP: [name], MD   REFERRING PROVIDER: Ollen Gross, MD  REFERRING DIAG: [phone] (ICD-10-CM) - Presence of right artificial knee joint  THERAPY DIAG:  Acute pain of right knee  Stiffness of right knee, not elsewhere classified  Rationale for Evaluation and Treatment: Rehabilitation  ONSET DATE: 02/16/2023  SUBJECTIVE:   SUBJECTIVE STATEMENT: Patient states she is controlling her pain very well.  She states she is walking more and is more active.     PERTINENT HISTORY: Hx of left TKA PAIN:  PAIN:  03/23/23: Are you having pain? Yes NPRS scale: 3/10 Pain location: Rt knee  Pain description: intermittent and aching  Aggravating factors: standing, bending knee, activity  Relieving factors: ice, pain medication   PRECAUTIONS: None  WEIGHT BEARING RESTRICTIONS: No  FALLS:  Has patient fallen in last 6 months? No  LIVING ENVIRONMENT: Lives with: lives with their spouse Lives in: House/apartment Stairs: Yes: Internal: 12 steps; on right going up and External: 5 steps; on right going up Has following equipment at home: Single point cane and Walker - 2 wheeled  OCCUPATION: Retired  PLOF: Independent, Independent with  basic ADLs, Independent with household mobility without device, Independent with community mobility without device, Independent with homemaking with ambulation, Independent with gait, and Independent with transfers  PATIENT GOALS: To return to her prior level of function  NEXT MD VISIT: 2 weeks  OBJECTIVE:   DIAGNOSTIC FINDINGS: na  PATIENT SURVEYS:  FOTO t  COGNITION: Overall cognitive status: Within functional limits for tasks  assessed     SENSATION: WFL  EDEMA:  Circumferential: Mid patella  POSTURE: No Significant postural limitations  PALPATION: Tender over anterior knee and lateral knee  LOWER EXTREMITY ROM:  Active ROM Right eval Left eval  Knee flexion 48   Knee extension -10    (Blank rows = not tested)  LOWER EXTREMITY MMT: (held on initial eval due to 4 days post op)  MMT Right eval Left eval  Hip external rotation    Knee flexion    Knee extension     (Blank rows = not tested)   FUNCTIONAL TESTS:  03/21/23: 5 times sit to stand: 21.3 seconds  Timed up and go (TUG): 36.48 second with walker   GAIT: Distance walked: 30 Assistive device utilized: Walker - 2 wheeled Level of assistance: SBA Comments: antalgic, slow and guarded   TODAY'S TREATMENT:           DATE: 03/23/23  NuStep: level 2x 6 minutes-PT present to discuss progress  Seated heel slides using slider x 20 Long arc quads 2 x 10 Sit to stand 2 x 5 Supine quad set x 20 Supine TKE x 20 (with hand towel, then with small blue noodle) Supine SAQ x 20 0# Supine SLR 2 x 10 with min assist on first set of 10 then no assist on 2nd set  Heel slides x 20 in supine Game Ready: med compression, 3 snowflakes x10 min to Rt knee   DATE: 03/21/23  NuStep: level 2x 6 minutes-PT present to discuss progress  5x sit to stand and TUG measures  Hamstring curls seated with red band 2x10 on Rt  Heel slides in sitting with overpressure x10 Long arc quads x10 Supine heel slidesx5 Quad sets 5" hold  Short arc quads: 5" hold x10 Game Ready: med compression, 3 snowflakes x10 min to Rt knee                                                                                                                     DATE: 03/14/23  Initial eval completed and initiated HEP   PATIENT EDUCATION:  Education details: Initiated HEP and discussed pain control Person educated: Patient Education method: Medical illustrator Education  comprehension: verbalized understanding, returned demonstration, and verbal cues required  HOME EXERCISE PROGRAM: Access Code: [facility] URL: [email or web address] Date: 03/17/2023 Prepared by: Mikey Kirschner  Exercises - Seated Knee Extension Stretch with Chair  - 1 x daily - 7 x weekly - 1 sets - 1 reps - work up to 30 min hold - Supine Knee Extension Stretch on Towel Roll  - 1 x  daily - 7 x weekly - 1 sets - 1 reps - work up to 30 min hold - Long Sitting Quad Set  - 1 x daily - 7 x weekly - 3 sets - 10 reps - Supine Heel Slide  - 1 x daily - 7 x weekly - 3 sets - 10 reps - Supine Heel Slide with Strap  - 1 x daily - 7 x weekly - 3 sets - 10 reps  ASSESSMENT:  CLINICAL IMPRESSION: Elaria is progressing very well with flexion and extension is approx -5 at worst.  She is very compliant with her HEP.  Her assistant needed verbal cues to avoid "overhelping".  Encouraged assistant to allow patient to do as much as she can do independently.  She has good quad activity at end range and only 5-10 degrees heel lag on SLR.   She should benefit from continued skilled PT for post TKA protocol.   OBJECTIVE IMPAIRMENTS: Abnormal gait, decreased balance, decreased endurance, difficulty walking, decreased ROM, decreased strength, increased edema, increased fascial restrictions, increased muscle spasms, impaired flexibility, and pain.   ACTIVITY LIMITATIONS: carrying, lifting, bending, sitting, standing, squatting, sleeping, stairs, transfers, bed mobility, bathing, toileting, dressing, and hygiene/grooming  PARTICIPATION LIMITATIONS: meal prep, cleaning, laundry, interpersonal relationship, driving, shopping, community activity, occupation, yard work, and church  PERSONAL FACTORS: Age, Fitness, and 1-2 comorbidities: Crohn's, OA, Htn  are also affecting patient's functional outcome.   REHAB POTENTIAL: Good  CLINICAL DECISION MAKING: Stable/uncomplicated  EVALUATION COMPLEXITY:  Low   GOALS: Goals reviewed with patient? Yes  SHORT TERM GOALS: Target date: 04/14/2023  Pain report to be no greater than 4/10  Baseline: Goal status: MET  2.  Patient will be independent with initial HEP  Baseline:  Goal status: INITIAL  3.  Extension ROM to improve to 0 Baseline:  Goal status: INITIAL  4.  Flexion ROM to improve to 90 degrees Baseline:  Goal status: INITIAL  5.  Patient to be able to walk with walker with step through gait Baseline:  Goal status: INITIAL   LONG TERM GOALS: Target date: 05/12/2023   Patient to be independent with advanced HEP  Baseline:  Goal status: INITIAL  2.  Patient to report pain no greater than 2/10  Baseline:  Goal status: INITIAL  3.  Functional ROM of left knee (no > -5 degrees ext and at least 110 degrees of flexion) Baseline:  Goal status: INITIAL  4.  FOTO to improve to predicted score Baseline:  Goal status: INITIAL  5.  Patient to be able to stand or walk for at least 15 min without a.d. as reported by patient  Baseline:  Goal status: INITIAL  6.  Functional scores to improve by 5-10 sec Baseline:  Goal status: INITIAL   PLAN:  PT FREQUENCY: 2x/week  PT DURATION: 8 weeks  PLANNED INTERVENTIONS: Therapeutic exercises, Therapeutic activity, Neuromuscular re-education, Balance training, Gait training, Patient/Family education, Self Care, Joint mobilization, Stair training, DME instructions, Aquatic Therapy, Dry Needling, Electrical stimulation, Cryotherapy, Moist heat, Taping, Vasopneumatic device, Manual therapy, and Re-evaluation  PLAN FOR NEXT SESSION: Continue Nustep, progress HEP, quad rehab, ROM, gait training, Game Ready   DIRECTV B. Byan Poplaski, PT 03/23/23 2:58 PM  [facility] [address], [address] [place], [place] [address] Phone # [phone] Fax [phone]

## 2023-03-29 ENCOUNTER — Telehealth: Payer: Self-pay | Admitting: Internal Medicine

## 2023-03-29 NOTE — Telephone Encounter (Signed)
Patients daughter called stating patient recently had knee surgery. She is wondering if it would be okay for the patient to take Aspirin once a day for a few weeks. She is requesting a call back to discuss this because she states that patient was instructed not to take aspirin to avoid blood clots. Please advise, thank you.

## 2023-03-30 ENCOUNTER — Ambulatory Visit: Payer: Medicare Other

## 2023-03-30 DIAGNOSIS — R262 Difficulty in walking, not elsewhere classified: Secondary | ICD-10-CM | POA: Diagnosis not present

## 2023-03-30 DIAGNOSIS — M6281 Muscle weakness (generalized): Secondary | ICD-10-CM | POA: Diagnosis not present

## 2023-03-30 DIAGNOSIS — M25661 Stiffness of right knee, not elsewhere classified: Secondary | ICD-10-CM

## 2023-03-30 DIAGNOSIS — R252 Cramp and spasm: Secondary | ICD-10-CM

## 2023-03-30 DIAGNOSIS — M25561 Pain in right knee: Secondary | ICD-10-CM

## 2023-03-30 NOTE — Therapy (Signed)
OUTPATIENT PHYSICAL THERAPY LOWER EXTREMITY TREATMENT NOTE   Patient Name: Jade Hernandez MRN: [id number] DOB:07-16-1941, 82 y.o., female Today's Date: 03/31/2023  END OF SESSION:  PT End of Session - 03/30/23 1406     Visit Number 4    Date for PT Re-Evaluation 05/12/23    Authorization Type MEDICARE PART A AND B    Progress Note Due on Visit 10    PT Start Time 1404    PT Stop Time 1448    PT Time Calculation (min) 44 min    Activity Tolerance Patient tolerated treatment well;No increased pain    Behavior During Therapy WFL for tasks assessed/performed              Past Medical History:  Diagnosis Date   Adenomatous colon polyp    Arthritis    Crohn's disease of both small and large intestine with other complication (HCC) 10/23/2020   Diverticulosis    High cholesterol    Hypertension    IBS (irritable bowel syndrome)    Palpitations    Pre-diabetes    UTI (urinary tract infection)    Past Surgical History:  Procedure Laterality Date   BILATERAL CARPAL TUNNEL RELEASE     COLONOSCOPY     NM MYOCAR PERF WALL MOTION  10/02/2007   No significant ischemia demonstrated   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   Patient Active Problem List   Diagnosis Date Noted   Primary osteoarthritis of right knee 03/14/2023   Long-term use of immunosuppressant medication _ Entyvio 02/16/2021   Iron deficiency anemia due to chronic blood loss 11/25/2020   Crohn's disease of both small and large  intestine with other complication (HCC) 10/23/2020   OA (osteoarthritis) of knee 02/21/2017   History of palpitations 04/22/2015   Hyperlipidemia 01/11/2014   GERD (gastroesophageal reflux disease) 01/11/2014   Family history of coronary artery disease 01/11/2014   Osteoarthritis of knee 06/17/2012   Hypertension     PCP: [name], MD   REFERRING PROVIDER: Ollen Gross, MD  REFERRING DIAG: [phone] (ICD-10-CM) - Presence of right artificial knee joint  THERAPY DIAG:  Acute pain of right knee  Stiffness of right knee, not elsewhere classified  Difficulty in walking, not elsewhere classified  Muscle weakness (generalized)  Cramp and spasm  Rationale for Evaluation and Treatment: Rehabilitation  ONSET DATE: 02/16/2023  SUBJECTIVE:   SUBJECTIVE STATEMENT: Patient states she saw MD for f/u and he was pleased with her progress.  She rates her pain today at 3/10     PERTINENT HISTORY: Hx of left TKA PAIN:  PAIN:  03/30/23: Are you having pain? Yes NPRS scale: 3/10 Pain location: Rt knee  Pain description: intermittent and aching  Aggravating factors: standing, bending knee, activity  Relieving factors: ice, pain medication   PRECAUTIONS: None  WEIGHT BEARING RESTRICTIONS: No  FALLS:  Has patient fallen in last 6 months? No  LIVING ENVIRONMENT: Lives with: lives with their spouse Lives in: House/apartment Stairs: Yes: Internal: 12 steps; on right going up and External:  5 steps; on right going up Has following equipment at home: Single point cane and Walker - 2 wheeled  OCCUPATION: Retired  PLOF: Independent, Independent with basic ADLs, Independent with household mobility without device, Independent with community mobility without device, Independent with homemaking with ambulation, Independent with gait, and Independent with transfers  PATIENT GOALS: To return to her prior level of function  NEXT MD VISIT: 2 weeks  OBJECTIVE:   DIAGNOSTIC  FINDINGS: na  PATIENT SURVEYS:  FOTO t  COGNITION: Overall cognitive status: Within functional limits for tasks assessed     SENSATION: WFL  EDEMA:  Circumferential: Mid patella  POSTURE: No Significant postural limitations  PALPATION: Tender over anterior knee and lateral knee  LOWER EXTREMITY ROM:  Active ROM Right eval Left eval  Knee flexion 48   Knee extension -10    (Blank rows = not tested)  LOWER EXTREMITY MMT: (held on initial eval due to 4 days post op)  MMT Right eval Left eval  Hip external rotation    Knee flexion    Knee extension     (Blank rows = not tested)   FUNCTIONAL TESTS:  03/21/23: 5 times sit to stand: 21.3 seconds  Timed up and go (TUG): 36.48 second with walker   GAIT: Distance walked: 30 Assistive device utilized: Walker - 2 wheeled Level of assistance: SBA Comments: antalgic, slow and guarded   TODAY'S TREATMENT:           DATE: 03/30/23  NuStep: level 5 x 5 minutes-PT present to discuss progress  Seated heel slides using slider x 20 Long arc quads 2 x 10 Sit to stand 2 x 5 Supine quad set x 20 Supine hip abduction 2 x 10 Supine TKE x 20 (with small green noodle then with half foam roll) Supine SAQ x 20  1.5 lbs with blue foam roller under knee Supine SAQ x 20 with larger blue bolster Supine SLR 2 x 10 with min assist on first set of 10 then no assist on 2nd set  Heel slides x 20 in supine Game Ready: med compression, 3 snowflakes x10 min to Rt knee   DATE: 03/23/23  NuStep: level 2x 6 minutes-PT present to discuss progress  Seated heel slides using slider x 20 Long arc quads 2 x 10 Sit to stand 2 x 5 Supine quad set x 20 Supine TKE x 20 (with hand towel, then with small blue noodle) Supine SAQ x 20 0# Supine SLR 2 x 10 with min assist on first set of 10 then no assist on 2nd set  Heel slides x 20 in supine Game Ready: med compression, 3 snowflakes x10 min to Rt knee   DATE: 03/21/23  NuStep: level 2x 6 minutes-PT  present to discuss progress  5x sit to stand and TUG measures  Hamstring curls seated with red band 2x10 on Rt  Heel slides in sitting with overpressure x10 Long arc quads x10 Supine heel slidesx5 Quad sets 5" hold  Short arc quads: 5" hold x10 Game Ready: med compression, 3 snowflakes x10 min to Rt knee  PATIENT EDUCATION:  Education details: Initiated HEP and discussed pain control Person educated: Patient Education method: Medical illustrator Education comprehension: verbalized understanding, returned demonstration, and verbal cues required  HOME EXERCISE PROGRAM: Access Code: [facility] URL: [email or web address] Date: 03/17/2023 Prepared by: Mikey Kirschner  Exercises - Seated Knee Extension Stretch with Chair  - 1 x daily - 7 x weekly - 1 sets - 1 reps - work up to 30 min hold - Supine Knee Extension Stretch on Towel Roll  - 1 x daily - 7 x weekly - 1 sets - 1 reps - work up to 30 min hold - Long Sitting Quad Set  - 1 x daily - 7 x weekly - 3 sets - 10 reps - Supine Heel Slide  - 1 x daily - 7 x weekly - 3 sets - 10 reps - Supine Heel Slide with Strap  - 1 x daily - 7 x weekly - 3 sets - 10 reps  ASSESSMENT:  CLINICAL IMPRESSION: Jade Hernandez is progressing appropriately.  Her pain is well controlled.  She has good return of ROM and good quad activity at end range.   She should benefit from continued skilled PT for post TKA protocol.   OBJECTIVE IMPAIRMENTS: Abnormal gait, decreased balance, decreased endurance, difficulty walking, decreased ROM, decreased strength, increased edema, increased fascial restrictions, increased muscle spasms, impaired flexibility, and pain.   ACTIVITY LIMITATIONS: carrying, lifting, bending, sitting, standing, squatting, sleeping, stairs, transfers, bed mobility, bathing, toileting, dressing, and  hygiene/grooming  PARTICIPATION LIMITATIONS: meal prep, cleaning, laundry, interpersonal relationship, driving, shopping, community activity, occupation, yard work, and church  PERSONAL FACTORS: Age, Fitness, and 1-2 comorbidities: Crohn's, OA, Htn  are also affecting patient's functional outcome.   REHAB POTENTIAL: Good  CLINICAL DECISION MAKING: Stable/uncomplicated  EVALUATION COMPLEXITY: Low   GOALS: Goals reviewed with patient? Yes  SHORT TERM GOALS: Target date: 04/14/2023  Pain report to be no greater than 4/10  Baseline: Goal status: MET  2.  Patient will be independent with initial HEP  Baseline:  Goal status: INITIAL  3.  Extension ROM to improve to 0 Baseline:  Goal status: INITIAL  4.  Flexion ROM to improve to 90 degrees Baseline:  Goal status: INITIAL  5.  Patient to be able to walk with walker with step through gait Baseline:  Goal status: INITIAL   LONG TERM GOALS: Target date: 05/12/2023   Patient to be independent with advanced HEP  Baseline:  Goal status: INITIAL  2.  Patient to report pain no greater than 2/10  Baseline:  Goal status: INITIAL  3.  Functional ROM of left knee (no > -5 degrees ext and at least 110 degrees of flexion) Baseline:  Goal status: INITIAL  4.  FOTO to improve to predicted score Baseline:  Goal status: INITIAL  5.  Patient to be able to stand or walk for at least 15 min without a.d. as reported by patient  Baseline:  Goal status: INITIAL  6.  Functional scores to improve by 5-10 sec Baseline:  Goal status: INITIAL   PLAN:  PT FREQUENCY: 2x/week  PT DURATION: 8 weeks  PLANNED INTERVENTIONS: Therapeutic exercises, Therapeutic activity, Neuromuscular re-education, Balance training, Gait training, Patient/Family education, Self Care, Joint mobilization, Stair training, DME instructions, Aquatic Therapy, Dry Needling, Electrical stimulation, Cryotherapy, Moist heat, Taping, Vasopneumatic device, Manual  therapy, and Re-evaluation  PLAN FOR NEXT SESSION: Continue Nustep, progress HEP, quad rehab, ROM, gait training, Game Ready   DIRECTV B. Surena Welge, PT 03/31/23 12:07 AM  [facility] [address] [place], [place] [address] Phone # [phone] Fax [phone]

## 2023-03-30 NOTE — Telephone Encounter (Signed)
Pt questioned if it was safe for her to take a baby Asprin for a couple of weeks after her knee surgery. Pt was notified to proceed with taking the baby Asprin to prevent the blood clots as instructed by her ortho Dr. Rock Nephew verbalized understanding with all questions answered.

## 2023-04-05 ENCOUNTER — Ambulatory Visit: Payer: Medicare Other

## 2023-04-05 ENCOUNTER — Ambulatory Visit: Payer: Medicare Other | Admitting: Physical Therapy

## 2023-04-05 DIAGNOSIS — M25561 Pain in right knee: Secondary | ICD-10-CM | POA: Diagnosis not present

## 2023-04-05 DIAGNOSIS — M25661 Stiffness of right knee, not elsewhere classified: Secondary | ICD-10-CM | POA: Diagnosis not present

## 2023-04-05 DIAGNOSIS — R262 Difficulty in walking, not elsewhere classified: Secondary | ICD-10-CM | POA: Diagnosis not present

## 2023-04-05 DIAGNOSIS — M6281 Muscle weakness (generalized): Secondary | ICD-10-CM | POA: Diagnosis not present

## 2023-04-05 DIAGNOSIS — R252 Cramp and spasm: Secondary | ICD-10-CM | POA: Diagnosis not present

## 2023-04-05 NOTE — Therapy (Signed)
OUTPATIENT PHYSICAL THERAPY LOWER EXTREMITY TREATMENT NOTE   Patient Name: Jade Hernandez MRN: [id number] DOB:1941-07-23, 82 y.o., female Today's Date: 03/31/2023  END OF SESSION:  PT End of Session - 03/30/23 1406     Visit Number 4    Date for PT Re-Evaluation 05/12/23    Authorization Type MEDICARE PART A AND B    Progress Note Due on Visit 10    PT Start Time 1404    PT Stop Time 1448    PT Time Calculation (min) 44 min    Activity Tolerance Patient tolerated treatment well;No increased pain    Behavior During Therapy WFL for tasks assessed/performed              Past Medical History:  Diagnosis Date   Adenomatous colon polyp    Arthritis    Crohn's disease of both small and large intestine with other complication (HCC) 10/23/2020   Diverticulosis    High cholesterol    Hypertension    IBS (irritable bowel syndrome)    Palpitations    Pre-diabetes    UTI (urinary tract infection)    Past Surgical History:  Procedure Laterality Date   BILATERAL CARPAL TUNNEL RELEASE     COLONOSCOPY     NM MYOCAR PERF WALL MOTION  10/02/2007   No significant ischemia demonstrated   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   Patient Active Problem List   Diagnosis Date Noted   Primary osteoarthritis of right knee 03/14/2023   Long-term use of immunosuppressant medication _ Entyvio 02/16/2021   Iron deficiency anemia due to chronic blood loss 11/25/2020   Crohn's disease of both small and large  intestine with other complication (HCC) 10/23/2020   OA (osteoarthritis) of knee 02/21/2017   History of palpitations 04/22/2015   Hyperlipidemia 01/11/2014   GERD (gastroesophageal reflux disease) 01/11/2014   Family history of coronary artery disease 01/11/2014   Osteoarthritis of knee 06/17/2012   Hypertension     PCP: [name], MD   REFERRING PROVIDER: Ollen Gross, MD  REFERRING DIAG: [phone] (ICD-10-CM) - Presence of right artificial knee joint  THERAPY DIAG:  Acute pain of right knee  Stiffness of right knee, not elsewhere classified  Difficulty in walking, not elsewhere classified  Muscle weakness (generalized)  Cramp and spasm  Rationale for Evaluation and Treatment: Rehabilitation  ONSET DATE: 02/16/2023  SUBJECTIVE:   SUBJECTIVE STATEMENT: Patient presents with her RW but also brought her cane in.  States she's been using that more the last 2 days.  Reports she's feeling tired today/did not sleep well last night.      PERTINENT HISTORY: Hx of left TKA PAIN:  PAIN:  03/30/23: Are you having pain? Yes NPRS scale: 3/10 Pain location: Rt knee  Pain description: intermittent and aching  Aggravating factors: standing, bending knee, activity  Relieving factors: ice, pain medication   PRECAUTIONS: None  WEIGHT BEARING RESTRICTIONS: No  FALLS:  Has patient fallen in last 6 months? No  LIVING ENVIRONMENT: Lives with: lives with their spouse Lives in:  House/apartment Stairs: Yes: Internal: 12 steps; on right going up and External: 5 steps; on right going up Has following equipment at home: Single point cane and Walker - 2 wheeled  OCCUPATION: Retired  PLOF: Independent, Independent with basic ADLs, Independent with household mobility without device, Independent with community mobility without device, Independent with homemaking with ambulation, Independent with gait, and Independent with transfers  PATIENT GOALS: To return to her prior level  of function  NEXT MD VISIT: 2 weeks  OBJECTIVE:   DIAGNOSTIC FINDINGS: na  PATIENT SURVEYS:  FOTO t  COGNITION: Overall cognitive status: Within functional limits for tasks assessed     SENSATION: WFL  EDEMA:  Circumferential: Mid patella  POSTURE: No Significant postural limitations  PALPATION: Tender over anterior knee and lateral knee  LOWER EXTREMITY ROM:  Active ROM Right eval Left eval 5/21  Knee flexion 48  Seated 92  Knee extension -10  -8 supine   (Blank rows = not tested)  LOWER EXTREMITY MMT: (held on initial eval due to 4 days post op)  MMT Right eval Left eval  Hip external rotation    Knee flexion    Knee extension     (Blank rows = not tested)   FUNCTIONAL TESTS:  03/21/23: 5 times sit to stand: 21.3 seconds  Timed up and go (TUG): 36.48 second with walker   GAIT: Distance walked: 30 Assistive device utilized: Walker - 2 wheeled Level of assistance: SBA Comments: antalgic, slow and guarded   TODAY'S TREATMENT:       DATE: 04/05/23  NuStep: level 5 x 5 minutes-PT present to discuss progress  Seated heel slides using slider x 20 Long arc quads 1 x 10 Manual therapy: supine HS contract relax 3x 5 sec holdsl passive HS stretch 5x, supine knee flexion ROM 10x; patellar inferior mobs grade 3 Supine green ball rolls for flexion 10x Gait with SPC and why use in left hand recommended Up and down 4 steps with cane and 1 railing: up with good, down with the bad 2nd step dynamic knee flexion 10x 2nd step dynamic knee extension 10x Game Ready: med compression, 3 snowflakes x10 min to Rt knee        DATE: 03/30/23  NuStep: level 5 x 5 minutes-PT present to discuss progress  Seated heel slides using slider x 20 Long arc quads 2 x 10 Sit to stand 2 x 5 Supine quad set x 20 Supine hip abduction 2 x 10 Supine TKE x 20 (with small green noodle then with half foam roll) Supine SAQ x 20  1.5 lbs with blue foam roller under knee Supine SAQ x 20 with  larger blue bolster Supine SLR 2 x 10 with min assist on first set of 10 then no assist on 2nd set  Heel slides x 20 in supine Game Ready: med compression, 3 snowflakes x10 min to Rt knee   DATE: 03/23/23  NuStep: level 2x 6 minutes-PT present to discuss progress  Seated heel slides using slider x 20 Long arc quads 2 x 10 Sit to stand 2 x 5 Supine quad set x 20 Supine TKE x 20 (with hand towel, then with small blue noodle) Supine SAQ x 20 0# Supine SLR 2 x 10 with min assist on first set of 10 then no assist on 2nd set  Heel slides x 20 in supine Game Ready: med compression, 3 snowflakes x10 min to Rt knee  PATIENT EDUCATION:  Education details: Initiated HEP and discussed pain control Person educated: Patient Education method: Medical illustrator Education comprehension: verbalized understanding, returned demonstration, and verbal cues required  HOME EXERCISE PROGRAM: Access Code: [facility] URL: [email or web address] Date: 04/05/2023 Prepared by: Lavinia Sharps  Exercises - Seated Knee Extension Stretch with Chair  - 1 x daily - 7 x weekly - 1 sets - 1 reps - work up to 30 min hold - Supine Knee Extension Stretch on Towel Roll  - 1 x daily - 7 x weekly - 1 sets - 1 reps - work up to 30 min hold - Long Sitting Quad Set  - 1 x daily - 7 x weekly - 3 sets - 10 reps - Supine Heel Slide  - 1 x daily - 7 x weekly - 3 sets - 10 reps - Supine Heel Slide with Strap  - 1 x daily - 7 x weekly - 3 sets - 10 reps - Seated Knee Flexion Extension AROM   - 2 x daily - 7 x weekly - 1 sets - 10 reps - 5 hold - Standing Knee Flexion Stretch on Step  - 5 x daily - 7 x weekly - 1 sets - 10 reps - Standing Hamstring Stretch with Step  - 5 x daily - 7 x weekly - 1 sets - 10 reps  ASSESSMENT:  CLINICAL IMPRESSION: Excellent improvement in knee ROM for both  flexion and extension compared to initial evaluation.  Improving LE strength as well as demonstrated by increased weight bearing and use of the cane. Therapist providing verbal cues to optimize technique with  exercises in order to achieve the greatest benefit.  Decreased knee edema following vasocompression, moderate ankle edema persists.    OBJECTIVE IMPAIRMENTS: Abnormal gait, decreased balance, decreased endurance, difficulty walking, decreased ROM, decreased strength, increased edema, increased fascial restrictions, increased muscle spasms, impaired flexibility, and pain.   ACTIVITY LIMITATIONS: carrying, lifting, bending, sitting, standing, squatting, sleeping, stairs, transfers, bed mobility, bathing, toileting, dressing, and hygiene/grooming  PARTICIPATION LIMITATIONS: meal prep, cleaning, laundry, interpersonal relationship, driving, shopping, community activity, occupation, yard work, and church  PERSONAL FACTORS: Age, Fitness, and 1-2 comorbidities: Crohn's, OA, Htn  are also affecting patient's functional outcome.   REHAB POTENTIAL: Good  CLINICAL DECISION MAKING: Stable/uncomplicated  EVALUATION COMPLEXITY: Low   GOALS: Goals reviewed with patient? Yes  SHORT TERM GOALS: Target date: 04/14/2023  Pain report to be no greater than 4/10  Baseline: Goal status: MET  2.  Patient will be independent with initial HEP  Baseline:  Goal status: INITIAL  3.  Extension ROM to improve to 0 Baseline:  Goal status: INITIAL  4.  Flexion ROM to improve to 90 degrees Baseline:  Goal status: met 5/21  5.  Patient to be able to walk with walker with step through gait Baseline:  Goal status: met 5/21  LONG TERM GOALS: Target date: 05/12/2023   Patient to be independent with advanced HEP  Baseline:  Goal status: INITIAL  2.  Patient to report pain no greater than 2/10  Baseline:  Goal status: INITIAL  3.  Functional ROM of left knee (no > -5 degrees ext and at least 110  degrees of flexion) Baseline:  Goal status: INITIAL  4.  FOTO to improve to predicted score Baseline:  Goal status: INITIAL  5.  Patient to be able to stand or walk for at least 15 min without a.d. as reported by patient  Baseline:  Goal status: INITIAL  6.  Functional scores to improve by 5-10 sec Baseline:  Goal status: INITIAL   PLAN:  PT FREQUENCY: 2x/week  PT DURATION: 8 weeks  PLANNED INTERVENTIONS: Therapeutic exercises, Therapeutic activity, Neuromuscular re-education, Balance training, Gait training, Patient/Family education, Self Care, Joint mobilization, Stair training, DME instructions, Aquatic Therapy, Dry Needling, Electrical stimulation, Cryotherapy, Moist heat, Taping, Vasopneumatic device, Manual therapy, and Re-evaluation  PLAN FOR NEXT SESSION: Continue Nustep, progress HEP, quad rehab, ROM, gait training, Game Ready  Lavinia Sharps, PT 04/05/23 6:15 PM Phone: [phone] Fax: [phone]  [facility] [address], [address] [place], [place] [address] Phone # [phone] Fax [phone]

## 2023-04-06 ENCOUNTER — Ambulatory Visit (INDEPENDENT_AMBULATORY_CARE_PROVIDER_SITE_OTHER): Payer: Medicare Other

## 2023-04-06 ENCOUNTER — Ambulatory Visit: Payer: Medicare Other

## 2023-04-06 VITALS — BP 136/71 | HR 77 | Temp 98.0°F | Resp 16 | Ht 63.0 in | Wt 170.6 lb

## 2023-04-06 DIAGNOSIS — K50818 Crohn's disease of both small and large intestine with other complication: Secondary | ICD-10-CM

## 2023-04-06 MED ORDER — VEDOLIZUMAB 300 MG IV SOLR
300.0000 mg | Freq: Once | INTRAVENOUS | Status: AC
  Administered 2023-04-06: 300 mg via INTRAVENOUS
  Filled 2023-04-06: qty 5

## 2023-04-06 NOTE — Progress Notes (Signed)
Diagnosis: Crohn's Disease  Provider:  Chilton Greathouse MD  Procedure: IV Infusion  IV Type: Peripheral, IV Location: L Antecubital  Entyvio (Vedolizumab), Dose: 300 mg  Infusion Start Time: 1454  Infusion Stop Time: 1532  Post Infusion IV Care: Peripheral IV Discontinued  Discharge: Condition: Good, Destination: Home . AVS Provided  Performed by:  Marilynn Rail, RN

## 2023-04-07 ENCOUNTER — Ambulatory Visit: Payer: Medicare Other | Admitting: Physical Therapy

## 2023-04-07 ENCOUNTER — Ambulatory Visit: Payer: Medicare Other

## 2023-04-07 DIAGNOSIS — M6281 Muscle weakness (generalized): Secondary | ICD-10-CM

## 2023-04-07 DIAGNOSIS — M25661 Stiffness of right knee, not elsewhere classified: Secondary | ICD-10-CM

## 2023-04-07 DIAGNOSIS — R262 Difficulty in walking, not elsewhere classified: Secondary | ICD-10-CM | POA: Diagnosis not present

## 2023-04-07 DIAGNOSIS — R252 Cramp and spasm: Secondary | ICD-10-CM | POA: Diagnosis not present

## 2023-04-07 DIAGNOSIS — M25561 Pain in right knee: Secondary | ICD-10-CM

## 2023-04-07 NOTE — Therapy (Signed)
OUTPATIENT PHYSICAL THERAPY LOWER EXTREMITY TREATMENT NOTE   Patient Name: Jade Hernandez MRN: [id number] DOB:05/24/1941, 82 y.o., female Today's Date: 04/07/2023  END OF SESSION:  PT End of Session - 04/07/23 0927     Visit Number 6    Date for PT Re-Evaluation 05/12/23    Authorization Type MEDICARE PART A AND B    Progress Note Due on Visit 10    PT Start Time 0930    PT Stop Time 1015    PT Time Calculation (min) 45 min    Activity Tolerance Patient tolerated treatment well;No increased pain              Past Medical History:  Diagnosis Date   Adenomatous colon polyp    Arthritis    Crohn's disease of both small and large intestine with other complication (HCC) 10/23/2020   Diverticulosis    High cholesterol    Hypertension    IBS (irritable bowel syndrome)    Palpitations    Pre-diabetes    UTI (urinary tract infection)    Past Surgical History:  Procedure Laterality Date   BILATERAL CARPAL TUNNEL RELEASE     COLONOSCOPY     NM MYOCAR PERF WALL MOTION  10/02/2007   No significant ischemia demonstrated   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   Patient Active Problem List   Diagnosis Date Noted   Primary osteoarthritis of right knee 03/14/2023   Long-term use of immunosuppressant medication _ Entyvio 02/16/2021   Iron deficiency anemia due to chronic blood loss 11/25/2020   Crohn's disease of both small and large intestine with other complication (HCC) 10/23/2020   OA  (osteoarthritis) of knee 02/21/2017   History of palpitations 04/22/2015   Hyperlipidemia 01/11/2014   GERD (gastroesophageal reflux disease) 01/11/2014   Family history of coronary artery disease 01/11/2014   Osteoarthritis of knee 06/17/2012   Hypertension     PCP: [name], MD   REFERRING PROVIDER: Ollen Gross, MD  REFERRING DIAG: [phone] (ICD-10-CM) - Presence of right artificial knee joint  THERAPY DIAG:  Acute pain of right knee  Stiffness of right knee, not elsewhere classified  Difficulty in walking, not elsewhere classified  Muscle weakness (generalized)  Rationale for Evaluation and Treatment: Rehabilitation  ONSET DATE: 02/16/2023  SUBJECTIVE:   SUBJECTIVE STATEMENT: Patient presents with a cane but no walker.  I'm hurting a little more so I took a Tramadol.   It's not comfortable to sleep in the bed yet, prefers the recliner.  PERTINENT HISTORY: Hx of left TKA PAIN:  PAIN:   Are you having pain? Yes NPRS scale: 2-3/10 Pain location: Rt knee  Pain description: intermittent and aching  Aggravating factors: standing, bending knee, activity  Relieving factors: ice, pain medication   PRECAUTIONS: None  WEIGHT BEARING RESTRICTIONS: No  FALLS:  Has patient fallen in last 6 months? No  LIVING ENVIRONMENT: Lives with: lives with their spouse Lives in: House/apartment Stairs: Yes: Internal: 12 steps; on right going up and External: 5 steps; on right going up  Has following equipment at home: Single point cane and Walker - 2 wheeled  OCCUPATION: Retired  PLOF: Independent, Independent with basic ADLs, Independent with household mobility without device, Independent with community mobility without device, Independent with homemaking with ambulation, Independent with gait, and Independent with transfers  PATIENT GOALS: To return to her prior level of function  NEXT MD VISIT: 2 weeks  OBJECTIVE:   DIAGNOSTIC FINDINGS: na  PATIENT SURVEYS:   FOTO t  COGNITION: Overall cognitive status: Within functional limits for tasks assessed     SENSATION: WFL  EDEMA:  Circumferential: Mid patella  POSTURE: No Significant postural limitations  PALPATION: Tender over anterior knee and lateral knee  LOWER EXTREMITY ROM:  Active ROM Right eval Left eval 5/21  Knee flexion 48  Seated 92  Knee extension -10  -8 supine   (Blank rows = not tested)  LOWER EXTREMITY MMT: (held on initial eval due to 4 days post op)  MMT Right eval Left eval  Hip external rotation    Knee flexion    Knee extension     (Blank rows = not tested)   FUNCTIONAL TESTS:  03/21/23: 5 times sit to stand: 21.3 seconds  Timed up and go (TUG): 36.48 second with walker   GAIT: Distance walked: 30 Assistive device utilized: Environmental consultant - 2 wheeled Level of assistance: SBA Comments: antalgic, slow and guarded   TODAY'S TREATMENT:      DATE:5/23:  NuStep: level 1 x 5 minutes-PT present to discuss progress  2nd step dynamic knee flexion 10x 2nd step dynamic knee extension 10x Seated heel slides using slider red band resisted x 10 Long arc quads 3# x 10 Manual therapy: supine HS contract relax 3x 5 sec holds passive HS stretch 5x, supine knee flexion ROM 10x; patellar inferior mobs grade 3 Supine green ball rolls for flexion 10x 4 inch step ups right 10x with bil UE support of railing Side stepping at the railing 3x 2 inch step downs 10x right single arm support of railing Game Ready: med compression, 3 snowflakes x10 min to Rt knee          DATE: 04/05/23  NuStep: level 5 x 5 minutes-PT present to discuss progress  Seated heel slides using slider x 20 Long arc quads 1 x 10 Manual therapy: supine HS contract relax 3x 5 sec holdsl passive HS stretch 5x, supine knee flexion ROM 10x; patellar inferior mobs grade 3 Supine green ball rolls for flexion 10x Gait with SPC and why use in left hand recommended Up and down 4 steps with cane and 1 railing:  up with good, down with the bad 2nd step dynamic knee flexion 10x 2nd step dynamic knee extension 10x Game Ready: med compression, 3 snowflakes x10 min to Rt knee        DATE: 03/30/23  NuStep: level 5 x 5 minutes-PT present to discuss progress  2nd step knee flexion  Seated heel slides using slider x 20 Long arc quads 2 x 10 Sit to stand 2 x 5 Supine quad set x 20 Supine hip abduction 2 x 10 Supine TKE x 20 (with small green noodle then with half foam roll) Supine SAQ x 20  1.5 lbs with blue foam roller under knee Supine SAQ x 20 with larger blue bolster Supine SLR 2 x 10 with min assist on first set of 10 then no assist on 2nd set  Heel slides x 20 in supine Game Ready: med compression, 3 snowflakes x10 min  to Rt knee                                                                                                                      PATIENT EDUCATION:  Education details: Initiated HEP and discussed pain control Person educated: Patient Education method: Medical illustrator Education comprehension: verbalized understanding, returned demonstration, and verbal cues required  HOME EXERCISE PROGRAM: Access Code: [facility] URL: [email or web address] Date: 04/05/2023 Prepared by: Lavinia Sharps  Exercises - Seated Knee Extension Stretch with Chair  - 1 x daily - 7 x weekly - 1 sets - 1 reps - work up to 30 min hold - Supine Knee Extension Stretch on Towel Roll  - 1 x daily - 7 x weekly - 1 sets - 1 reps - work up to 30 min hold - Long Sitting Quad Set  - 1 x daily - 7 x weekly - 3 sets - 10 reps - Supine Heel Slide  - 1 x daily - 7 x weekly - 3 sets - 10 reps - Supine Heel Slide with Strap  - 1 x daily - 7 x weekly - 3 sets - 10 reps - Seated Knee Flexion Extension AROM   - 2 x daily - 7 x weekly - 1 sets - 10 reps - 5 hold - Standing Knee Flexion Stretch on Step  - 5 x daily - 7 x weekly - 1 sets - 10 reps - Standing Hamstring Stretch with Step  - 5 x daily  - 7 x weekly - 1 sets - 10 reps  ASSESSMENT:  CLINICAL IMPRESSION: Patient is progressing steadily with knee flexion and extension ROM as well as quad and HS strength.  Although she arrives with knee pain she reports decreased pain end of session. Improving weight bearing capacity on right LE with use of SPC now rather than walker.  Therapist providing verbal cues to optimize technique with  exercises in order to achieve the greatest benefit.   OBJECTIVE IMPAIRMENTS: Abnormal gait, decreased balance, decreased endurance, difficulty walking, decreased ROM, decreased strength, increased edema, increased fascial restrictions, increased muscle spasms, impaired flexibility, and pain.   ACTIVITY LIMITATIONS: carrying, lifting, bending, sitting, standing, squatting, sleeping, stairs, transfers, bed mobility, bathing, toileting, dressing, and hygiene/grooming  PARTICIPATION LIMITATIONS: meal prep, cleaning, laundry, interpersonal relationship, driving, shopping, community activity, occupation, yard work, and church  PERSONAL FACTORS: Age, Fitness, and 1-2 comorbidities: Crohn's, OA, Htn  are also affecting patient's functional outcome.   REHAB POTENTIAL: Good  CLINICAL DECISION MAKING: Stable/uncomplicated  EVALUATION COMPLEXITY: Low   GOALS: Goals reviewed with patient? Yes  SHORT TERM GOALS: Target date: 04/14/2023  Pain report to be no greater than 4/10  Baseline: Goal status: MET  2.  Patient will be independent with initial HEP  Baseline:  Goal status: INITIAL  3.  Extension ROM to improve to 0 Baseline:  Goal status: INITIAL  4.  Flexion ROM to improve to 90 degrees Baseline:  Goal status: met 5/21  5.  Patient  to be able to walk with walker with step through gait Baseline:  Goal status: met 5/21  LONG TERM GOALS: Target date: 05/12/2023   Patient to be independent with advanced HEP  Baseline:  Goal status: INITIAL  2.  Patient to report pain no greater than 2/10   Baseline:  Goal status: INITIAL  3.  Functional ROM of left knee (no > -5 degrees ext and at least 110 degrees of flexion) Baseline:  Goal status: INITIAL  4.  FOTO to improve to predicted score Baseline:  Goal status: INITIAL  5.  Patient to be able to stand or walk for at least 15 min without a.d. as reported by patient  Baseline:  Goal status: INITIAL  6.  Functional scores to improve by 5-10 sec Baseline:  Goal status: INITIAL   PLAN:  PT FREQUENCY: 2x/week  PT DURATION: 8 weeks  PLANNED INTERVENTIONS: Therapeutic exercises, Therapeutic activity, Neuromuscular re-education, Balance training, Gait training, Patient/Family education, Self Care, Joint mobilization, Stair training, DME instructions, Aquatic Therapy, Dry Needling, Electrical stimulation, Cryotherapy, Moist heat, Taping, Vasopneumatic device, Manual therapy, and Re-evaluation  PLAN FOR NEXT SESSION:  Nustep, progress HEP, quad and HS strengthening, ROM, gait training, Game Ready   Lavinia Sharps, PT 04/07/23 5:29 PM Phone: [phone] Fax: [phone]  [facility] [address], [address] [place], [place] [address] Phone # [phone] Fax [phone]

## 2023-04-12 ENCOUNTER — Ambulatory Visit: Payer: Medicare Other

## 2023-04-12 DIAGNOSIS — M25661 Stiffness of right knee, not elsewhere classified: Secondary | ICD-10-CM

## 2023-04-12 DIAGNOSIS — R252 Cramp and spasm: Secondary | ICD-10-CM

## 2023-04-12 DIAGNOSIS — M25561 Pain in right knee: Secondary | ICD-10-CM | POA: Diagnosis not present

## 2023-04-12 DIAGNOSIS — R262 Difficulty in walking, not elsewhere classified: Secondary | ICD-10-CM | POA: Diagnosis not present

## 2023-04-12 DIAGNOSIS — M6281 Muscle weakness (generalized): Secondary | ICD-10-CM

## 2023-04-12 NOTE — Therapy (Signed)
OUTPATIENT PHYSICAL THERAPY LOWER EXTREMITY TREATMENT NOTE   Patient Name: YANIS BRUS MRN: [id number] DOB:13-Jul-1941, 82 y.o., female Today's Date: 04/12/2023  END OF SESSION:  PT End of Session - 04/12/23 1407     Visit Number 7    Date for PT Re-Evaluation 05/12/23    Authorization Type MEDICARE PART A AND B    Progress Note Due on Visit 10    PT Start Time 1400    PT Stop Time 1445    PT Time Calculation (min) 45 min    Activity Tolerance Patient tolerated treatment well;No increased pain    Behavior During Therapy WFL for tasks assessed/performed              Past Medical History:  Diagnosis Date   Adenomatous colon polyp    Arthritis    Crohn's disease of both small and large intestine with other complication (HCC) 10/23/2020   Diverticulosis    High cholesterol    Hypertension    IBS (irritable bowel syndrome)    Palpitations    Pre-diabetes    UTI (urinary tract infection)    Past Surgical History:  Procedure Laterality Date   BILATERAL CARPAL TUNNEL RELEASE     COLONOSCOPY     NM MYOCAR PERF WALL MOTION  10/02/2007   No significant ischemia demonstrated   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   Patient Active Problem List   Diagnosis Date Noted   Primary osteoarthritis of right knee 03/14/2023   Long-term use of immunosuppressant medication _ Entyvio 02/16/2021   Iron deficiency anemia due to chronic blood loss 11/25/2020   Crohn's disease of both small and large  intestine with other complication (HCC) 10/23/2020   OA (osteoarthritis) of knee 02/21/2017   History of palpitations 04/22/2015   Hyperlipidemia 01/11/2014   GERD (gastroesophageal reflux disease) 01/11/2014   Family history of coronary artery disease 01/11/2014   Osteoarthritis of knee 06/17/2012   Hypertension     PCP: [name], MD   REFERRING PROVIDER: Ollen Gross, MD  REFERRING DIAG: [phone] (ICD-10-CM) - Presence of right artificial knee joint  THERAPY DIAG:  Acute pain of right knee  Stiffness of right knee, not elsewhere classified  Difficulty in walking, not elsewhere classified  Muscle weakness (generalized)  Cramp and spasm  Rationale for Evaluation and Treatment: Rehabilitation  ONSET DATE: 02/16/2023  SUBJECTIVE:   SUBJECTIVE STATEMENT: Patient reports she is doing ok.  Still sleeping in recliner.    PERTINENT HISTORY: Hx of left TKA PAIN:  PAIN:   04/12/23: Are you having pain? Yes NPRS scale: 3/10 Pain location: Rt knee  Pain description: intermittent and aching  Aggravating factors: standing, bending knee, activity  Relieving factors: ice, pain medication   PRECAUTIONS: None  WEIGHT BEARING RESTRICTIONS: No  FALLS:  Has patient fallen in last 6 months? No  LIVING ENVIRONMENT: Lives with: lives with their spouse Lives in: House/apartment Stairs: Yes: Internal: 12 steps; on right going up and External: 5 steps; on right going up Has following equipment at home:  Single point cane and Walker - 2 wheeled  OCCUPATION: Retired  PLOF: Independent, Independent with basic ADLs, Independent with household mobility without device, Independent with community mobility without device, Independent with homemaking with ambulation, Independent with gait, and Independent with transfers  PATIENT GOALS: To return to her prior level of function  NEXT MD VISIT: 2 weeks  OBJECTIVE:   DIAGNOSTIC FINDINGS: na  PATIENT SURVEYS:  FOTO  t  COGNITION: Overall cognitive status: Within functional limits for tasks assessed     SENSATION: WFL  EDEMA:  Circumferential: Mid patella  POSTURE: No Significant postural limitations  PALPATION: Tender over anterior knee and lateral knee  LOWER EXTREMITY ROM:  Active ROM Right eval Left eval 5/21  Knee flexion 48  Seated 92  Knee extension -10  -8 supine   (Blank rows = not tested)  LOWER EXTREMITY MMT: (held on initial eval due to 4 days post op)  MMT Right eval Left eval  Hip external rotation    Knee flexion    Knee extension     (Blank rows = not tested)   FUNCTIONAL TESTS:  03/21/23: 5 times sit to stand: 21.3 seconds  Timed up and go (TUG): 36.48 second with walker   GAIT: Distance walked: 30 Assistive device utilized: Environmental consultant - 2 wheeled Level of assistance: SBA Comments: antalgic, slow and guarded   TODAY'S TREATMENT:      DATE:5/28:  NuStep: level 3 x 5 minutes-PT present to discuss progress  Standing hamstring curl at bar: 2 x 10 Right foot in chair dynamic flexion x 20 4 inch step ups right x 20 with left UE support of railing 4 inch step downs x 10 right left single arm support of railing Lateral band walks 4 laps with blue loop Seated clam x 20 with blue loop Seated heel slides using slider x 10 Long arc quads 3# 2 x 10 Educated/reminded patient on need to sit with leg propped in extension and trying to sleep in bed as soon as possible.  Warned patient that postural issues will ensue if she continues to sleep in a recliner for a long period of time.   Game Ready: med compression, 3 snowflakes x10 min to Rt knee   DATE:5/23:  NuStep: level 1 x 5 minutes-PT present to discuss progress  2nd step dynamic knee flexion 10x 2nd step dynamic knee extension 10x Seated heel slides using slider red band resisted x 10 Long arc quads 3# x 10 Manual therapy: supine HS contract relax 3x 5 sec holds passive HS stretch 5x, supine knee flexion ROM 10x;  patellar inferior mobs grade 3 Supine green ball rolls for flexion 10x 4 inch step ups right 10x with bil UE support of railing Side stepping at the railing 3x 2 inch step downs 10x right single arm support of railing Game Ready: med compression, 3 snowflakes x10 min to Rt knee       DATE: 04/05/23  NuStep: level 5 x 5 minutes-PT present to discuss progress  Seated heel slides using slider x 20 Long arc quads 1 x 10 Manual therapy: supine HS contract relax 3x 5 sec holdsl passive HS stretch 5x, supine knee flexion ROM 10x; patellar inferior mobs grade 3 Supine green ball rolls for flexion 10x Gait with SPC and why use in left hand recommended Up and down 4 steps with cane and 1 railing: up with good, down with the bad 2nd step dynamic knee flexion 10x 2nd step dynamic knee extension 10x Game  Ready: med compression, 3 snowflakes x10 min to Rt knee    PATIENT EDUCATION:  Education details: Initiated HEP and discussed pain control Person educated: Patient Education method: Medical illustrator Education comprehension: verbalized understanding, returned demonstration, and verbal cues required  HOME EXERCISE PROGRAM: Access Code: [facility] URL: [email or web address] Date: 04/05/2023 Prepared by: Lavinia Sharps  Exercises - Seated Knee Extension Stretch with Chair  - 1 x daily - 7 x weekly - 1 sets - 1 reps - work up to 30 min hold - Supine Knee Extension Stretch on Towel Roll  - 1 x daily - 7 x weekly - 1 sets - 1 reps - work up to 30 min hold - Long Sitting Quad Set  - 1 x daily - 7 x weekly - 3 sets - 10 reps - Supine Heel Slide  - 1 x daily - 7 x weekly - 3 sets - 10 reps - Supine Heel Slide with Strap  - 1 x daily - 7 x weekly - 3 sets - 10 reps - Seated Knee Flexion Extension AROM   - 2 x daily - 7 x weekly - 1 sets - 10 reps - 5 hold - Standing Knee Flexion Stretch on Step  - 5 x daily - 7 x weekly - 1 sets - 10 reps - Standing Hamstring Stretch with Step   - 5 x daily - 7 x weekly - 1 sets - 10 reps  ASSESSMENT:  CLINICAL IMPRESSION: Patient is progressing appropriately but is still sleeping in recliner and bilateral ankle edema is present indicating that she is sitting all day with her legs in dependent position.  She seemed surprised that she was to sit with her leg propped in extension throughout the day.  Not only for extension ROM but for her bilateral LE edema.  We had emphasized this heavily during her initial treatment session.  She is progressing very well functionally, however.  She is well motivated and compliant.  She should continue to do well.  She would benefit from continued skilled PT for post TKA protocol.    OBJECTIVE IMPAIRMENTS: Abnormal gait, decreased balance, decreased endurance, difficulty walking, decreased ROM, decreased strength, increased edema, increased fascial restrictions, increased muscle spasms, impaired flexibility, and pain.   ACTIVITY LIMITATIONS: carrying, lifting, bending, sitting, standing, squatting, sleeping, stairs, transfers, bed mobility, bathing, toileting, dressing, and hygiene/grooming  PARTICIPATION LIMITATIONS: meal prep, cleaning, laundry, interpersonal relationship, driving, shopping, community activity, occupation, yard work, and church  PERSONAL FACTORS: Age, Fitness, and 1-2 comorbidities: Crohn's, OA, Htn  are also affecting patient's functional outcome.   REHAB POTENTIAL: Good  CLINICAL DECISION MAKING: Stable/uncomplicated  EVALUATION COMPLEXITY: Low   GOALS: Goals reviewed with patient? Yes  SHORT TERM GOALS: Target date: 04/14/2023  Pain report to be no greater than 4/10  Baseline: Goal status: MET  2.  Patient will be independent with initial HEP  Baseline:  Goal status: IN PROGRESS  3.  Extension ROM to improve to 0 Baseline:  Goal status: IN PROGRESS  4.  Flexion ROM to improve to 90 degrees Baseline:  Goal status: met 5/21  5.  Patient to be able to walk with  walker with step through gait Baseline:  Goal status: met 5/21  LONG TERM GOALS: Target date: 05/12/2023   Patient to be independent with advanced HEP  Baseline:  Goal status: INITIAL  2.  Patient to report pain no greater than 2/10  Baseline:  Goal status: INITIAL  3.  Functional ROM of left  knee (no > -5 degrees ext and at least 110 degrees of flexion) Baseline:  Goal status: INITIAL  4.  FOTO to improve to predicted score Baseline:  Goal status: INITIAL  5.  Patient to be able to stand or walk for at least 15 min without a.d. as reported by patient  Baseline:  Goal status: INITIAL  6.  Functional scores to improve by 5-10 sec Baseline:  Goal status: INITIAL   PLAN:  PT FREQUENCY: 2x/week  PT DURATION: 8 weeks  PLANNED INTERVENTIONS: Therapeutic exercises, Therapeutic activity, Neuromuscular re-education, Balance training, Gait training, Patient/Family education, Self Care, Joint mobilization, Stair training, DME instructions, Aquatic Therapy, Dry Needling, Electrical stimulation, Cryotherapy, Moist heat, Taping, Vasopneumatic device, Manual therapy, and Re-evaluation  PLAN FOR NEXT SESSION:  Continue Nustep, walk test, progress HEP, quad and HS strengthening, ROM, gait training, Game Ready   DIRECTV B. Tkeya Stencil, PT 04/12/23 3:31 PM  [facility] [address] [place], [place] [address] Phone # [phone] Fax [phone]

## 2023-04-13 ENCOUNTER — Telehealth: Payer: Self-pay | Admitting: Cardiovascular Disease

## 2023-04-13 NOTE — Telephone Encounter (Signed)
Spoke to patient she stated while she was hospital recently with a knee replacement B/P low.Stated Amlodipine was stopped.Appointment scheduled with Jade Levering NP 6/12 at 2:45 pm.B/P last week at home 135/65.B/P at present 148/78 pulse 76.Patient will monitor B/P daily and bring readings to appointment.She will call sooner if B/P elevated.

## 2023-04-13 NOTE — Telephone Encounter (Signed)
Pt c/o medication issue:  1. Name of Medication: amLODipine (NORVASC) 2.5 MG tablet   2. How are you currently taking this medication (dosage and times per day)? Stopped taking at hospital  3. Are you having a reaction (difficulty breathing--STAT)?   4. What is your medication issue? Patient would like a call back to discuss this medication that was stopped at hospital due to lower BP readings.

## 2023-04-14 ENCOUNTER — Ambulatory Visit: Payer: Medicare Other | Admitting: Physical Therapy

## 2023-04-14 DIAGNOSIS — M6281 Muscle weakness (generalized): Secondary | ICD-10-CM | POA: Diagnosis not present

## 2023-04-14 DIAGNOSIS — R262 Difficulty in walking, not elsewhere classified: Secondary | ICD-10-CM | POA: Diagnosis not present

## 2023-04-14 DIAGNOSIS — M25661 Stiffness of right knee, not elsewhere classified: Secondary | ICD-10-CM

## 2023-04-14 DIAGNOSIS — R252 Cramp and spasm: Secondary | ICD-10-CM | POA: Diagnosis not present

## 2023-04-14 DIAGNOSIS — M25561 Pain in right knee: Secondary | ICD-10-CM | POA: Diagnosis not present

## 2023-04-14 NOTE — Therapy (Signed)
OUTPATIENT PHYSICAL THERAPY LOWER EXTREMITY TREATMENT NOTE   Patient Name: Jade Hernandez MRN: [id number] DOB:1941/06/21, 82 y.o., female Today's Date: 04/12/2023  END OF SESSION:  PT End of Session - 04/12/23 1407     Visit Number 7    Date for PT Re-Evaluation 05/12/23    Authorization Type MEDICARE PART A AND B    Progress Note Due on Visit 10    PT Start Time 1400    PT Stop Time 1445    PT Time Calculation (min) 45 min    Activity Tolerance Patient tolerated treatment well;No increased pain    Behavior During Therapy WFL for tasks assessed/performed              Past Medical History:  Diagnosis Date   Adenomatous colon polyp    Arthritis    Crohn's disease of both small and large intestine with other complication (HCC) 10/23/2020   Diverticulosis    High cholesterol    Hypertension    IBS (irritable bowel syndrome)    Palpitations    Pre-diabetes    UTI (urinary tract infection)    Past Surgical History:  Procedure Laterality Date   BILATERAL CARPAL TUNNEL RELEASE     COLONOSCOPY     NM MYOCAR PERF WALL MOTION  10/02/2007   No significant ischemia demonstrated   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   Patient Active Problem List   Diagnosis Date Noted   Primary osteoarthritis of right knee 03/14/2023   Long-term use of immunosuppressant medication _ Entyvio 02/16/2021   Iron deficiency anemia due to chronic blood loss 11/25/2020   Crohn's disease of both small and large  intestine with other complication (HCC) 10/23/2020   OA (osteoarthritis) of knee 02/21/2017   History of palpitations 04/22/2015   Hyperlipidemia 01/11/2014   GERD (gastroesophageal reflux disease) 01/11/2014   Family history of coronary artery disease 01/11/2014   Osteoarthritis of knee 06/17/2012   Hypertension     PCP: [name], MD   REFERRING PROVIDER: Ollen Gross, MD  REFERRING DIAG: [phone] (ICD-10-CM) - Presence of right artificial knee joint  THERAPY DIAG:  Acute pain of right knee  Stiffness of right knee, not elsewhere classified  Difficulty in walking, not elsewhere classified  Muscle weakness (generalized)  Cramp and spasm  Rationale for Evaluation and Treatment: Rehabilitation  ONSET DATE: 02/16/2023  SUBJECTIVE:   SUBJECTIVE STATEMENT: When can I drive?  Tried sleeping in the bed but I was up all night.  PERTINENT HISTORY: Hx of left TKA PAIN:  PAIN:   04/12/23: Are you having pain? Yes NPRS scale: 3/10 Pain location: Rt knee  Pain description: intermittent and aching  Aggravating factors: standing, bending knee, activity  Relieving factors: ice, pain medication   PRECAUTIONS: None  WEIGHT BEARING RESTRICTIONS: No  FALLS:  Has patient fallen in last 6 months? No  LIVING ENVIRONMENT: Lives with: lives with their spouse Lives in: House/apartment Stairs: Yes: Internal: 12 steps; on right going up and External: 5 steps; on right going up Has following  equipment at home: Single point cane and Walker - 2 wheeled  OCCUPATION: Retired  PLOF: Independent, Independent with basic ADLs, Independent with household mobility without device, Independent with community mobility without device, Independent with homemaking with ambulation, Independent with gait, and Independent with transfers  PATIENT GOALS: To return to her prior level of function  NEXT MD VISIT: 2 weeks  OBJECTIVE:   DIAGNOSTIC FINDINGS: na  PATIENT SURVEYS:  FOTO  t  COGNITION: Overall cognitive status: Within functional limits for tasks assessed     SENSATION: WFL  EDEMA:  Circumferential: Mid patella  POSTURE: No Significant postural limitations  PALPATION: Tender over anterior knee and lateral knee  LOWER EXTREMITY ROM:  Active ROM Right eval Left eval 5/21 5/30  Knee flexion 48  Seated 92 Supine 105  Knee extension -10  -8 supine Supine -5   (Blank rows = not tested)  LOWER EXTREMITY MMT: (held on initial eval due to 4 days post op)  MMT Right eval Left eval  Hip external rotation    Knee flexion    Knee extension     (Blank rows = not tested)   FUNCTIONAL TESTS:  03/21/23: 5 times sit to stand: 21.3 seconds  Timed up and go (TUG): 36.48 second with walker   GAIT: Distance walked: 30 Assistive device utilized: Environmental consultant - 2 wheeled Level of assistance: SBA Comments: antalgic, slow and guarded   TODAY'S TREATMENT:     5/30:  NuStep: level 3 x 5 minutes-PT present to discuss progress Manual therapy: supine HS contract relax 3x 5 sec holds passive HS stretch 5x, supine knee flexion ROM 10x Knee ROM  Seated red power cord gas to brake back and forth to simulate driving  4 inch step ups right x 10 with left UE support of railing 2 inch step downs x 10 right left single arm support of railing WB on right with left hip 4 ways 5x (very fatiguing) Leg press 60# bil 20x seat 7 (reclined back extra) Game Ready: med compression, 3 snowflakes x10 min to Rt knee      DATE:5/28:  NuStep: level 3 x 5 minutes-PT present to discuss progress  Manual therapy: supine HS contract relax 3x 5 sec holds passive HS stretch 5x, supine knee flexion ROM 10x;  Seated red power cord gas to brake back and forth to simulate driving Standing hamstring curl at bar: 2 x 10 Right foot in chair dynamic flexion x 20 4 inch step ups right x 20 with left UE support of railing 4 inch step downs x 10 right left single arm support of railing Lateral  band walks 4 laps with blue loop Seated clam x 20 with blue loop Seated heel slides using slider x 10 Long arc quads 3# 2 x 10 Educated/reminded patient on need to sit with leg propped in extension and trying to sleep in bed as soon as possible.  Warned patient that postural issues will ensue if she continues to sleep in a recliner for a long period of time.   Game Ready: med compression, 3 snowflakes x10 min to Rt knee   DATE:5/23:  NuStep: level 1 x 5 minutes-PT present to discuss progress  2nd step dynamic knee flexion 10x 2nd step dynamic knee extension 10x Seated heel slides using slider red band resisted x 10 Long arc quads 3# x 10 Manual therapy: supine HS contract relax 3x 5 sec holds passive HS stretch 5x, supine knee flexion ROM 10x; patellar inferior mobs grade 3  Supine green ball rolls for flexion 10x 4 inch step ups right 10x with bil UE support of railing Side stepping at the railing 3x 2 inch step downs 10x right single arm support of railing Game Ready: med compression, 3 snowflakes x10 min to Rt knee       DATE: 04/05/23  NuStep: level 5 x 5 minutes-PT present to discuss progress  Seated heel slides using slider x 20 Long arc quads 1 x 10 Manual therapy: supine HS contract relax 3x 5 sec holdsl passive HS stretch 5x, supine knee flexion ROM 10x; patellar inferior mobs grade 3 Supine green ball rolls for flexion 10x Gait with SPC and why use in left hand recommended Up and down 4 steps with cane and 1 railing: up with good, down with the bad 2nd step dynamic knee flexion 10x 2nd step dynamic knee extension 10x Game Ready: med compression, 3 snowflakes x10 min to Rt knee    PATIENT EDUCATION:  Education details: Initiated HEP and discussed pain control Person educated: Patient Education method: Medical illustrator Education comprehension: verbalized understanding, returned demonstration, and verbal cues required  HOME EXERCISE PROGRAM: Access Code:  [facility] URL: [email or web address] Date: 04/05/2023 Prepared by: Lavinia Sharps  Exercises - Seated Knee Extension Stretch with Chair  - 1 x daily - 7 x weekly - 1 sets - 1 reps - work up to 30 min hold - Supine Knee Extension Stretch on Towel Roll  - 1 x daily - 7 x weekly - 1 sets - 1 reps - work up to 30 min hold - Long Sitting Quad Set  - 1 x daily - 7 x weekly - 3 sets - 10 reps - Supine Heel Slide  - 1 x daily - 7 x weekly - 3 sets - 10 reps - Supine Heel Slide with Strap  - 1 x daily - 7 x weekly - 3 sets - 10 reps - Seated Knee Flexion Extension AROM   - 2 x daily - 7 x weekly - 1 sets - 10 reps - 5 hold - Standing Knee Flexion Stretch on Step  - 5 x daily - 7 x weekly - 1 sets - 10 reps - Standing Hamstring Stretch with Step  - 5 x daily - 7 x weekly - 1 sets - 10 reps  ASSESSMENT:  CLINICAL IMPRESSION: Good improvements with knee flexion and extension ROM.  Endrange extension may be limited by peri patellar edema.  Ambulating well with SPC.  Eccentric quad control (descending from a step) is limited and therefore she would benefit from further strengthening as she has many steps in her home.  Therapist providing verbal cues to optimize technique with  exercises in order to achieve the greatest benefit. Progressing steadily with goals.    OBJECTIVE IMPAIRMENTS: Abnormal gait, decreased balance, decreased endurance, difficulty walking, decreased ROM, decreased strength, increased edema, increased fascial restrictions, increased muscle spasms, impaired flexibility, and pain.   ACTIVITY LIMITATIONS: carrying, lifting, bending, sitting, standing, squatting, sleeping, stairs, transfers, bed mobility, bathing, toileting, dressing, and hygiene/grooming  PARTICIPATION LIMITATIONS: meal prep, cleaning, laundry, interpersonal relationship, driving, shopping, community activity, occupation, yard work, and church  PERSONAL FACTORS: Age, Fitness, and 1-2 comorbidities: Crohn's,  OA, Htn  are also affecting patient's functional outcome.   REHAB POTENTIAL: Good  CLINICAL DECISION MAKING: Stable/uncomplicated  EVALUATION COMPLEXITY: Low   GOALS: Goals reviewed with patient? Yes  SHORT TERM GOALS: Target date: 04/14/2023  Pain report to be no greater than 4/10  Baseline:  Goal status: MET  2.  Patient will be independent with initial HEP  Baseline:  Goal status: met 5/30  3.  Extension ROM to improve to 0 Baseline:  Goal status:partially met  4.  Flexion ROM to improve to 90 degrees Baseline:  Goal status: met 5/21  5.  Patient to be able to walk with walker with step through gait Baseline:  Goal status: met 5/21  LONG TERM GOALS: Target date: 05/12/2023   Patient to be independent with advanced HEP  Baseline:  Goal status: INITIAL  2.  Patient to report pain no greater than 2/10  Baseline:  Goal status: INITIAL  3.  Functional ROM of left knee (no > -5 degrees ext and at least 110 degrees of flexion) Baseline:  Goal status: INITIAL  4.  FOTO to improve to predicted score Baseline:  Goal status: INITIAL  5.  Patient to be able to stand or walk for at least 15 min without a.d. as reported by patient  Baseline:  Goal status: INITIAL  6.  Functional scores to improve by 5-10 sec Baseline:  Goal status: INITIAL   PLAN:  PT FREQUENCY: 2x/week  PT DURATION: 8 weeks  PLANNED INTERVENTIONS: Therapeutic exercises, Therapeutic activity, Neuromuscular re-education, Balance training, Gait training, Patient/Family education, Self Care, Joint mobilization, Stair training, DME instructions, Aquatic Therapy, Dry Needling, Electrical stimulation, Cryotherapy, Moist heat, Taping, Vasopneumatic device, Manual therapy, and Re-evaluation  PLAN FOR NEXT SESSION:  Continue Nustep, walk test, progress HEP, quad and HS strengthening, ROM, gait training, Game Ready   Lavinia Sharps, PT 04/14/23 5:50 PM Phone: [phone] Fax:  [phone]  [facility] [address] [place], [place] [address] Phone # [phone] Fax [phone]

## 2023-04-19 DIAGNOSIS — Z5189 Encounter for other specified aftercare: Secondary | ICD-10-CM | POA: Diagnosis not present

## 2023-04-20 ENCOUNTER — Ambulatory Visit: Payer: Medicare Other | Attending: Orthopedic Surgery | Admitting: Physical Therapy

## 2023-04-20 DIAGNOSIS — M25561 Pain in right knee: Secondary | ICD-10-CM | POA: Insufficient documentation

## 2023-04-20 DIAGNOSIS — M19012 Primary osteoarthritis, left shoulder: Secondary | ICD-10-CM | POA: Diagnosis not present

## 2023-04-20 DIAGNOSIS — M25661 Stiffness of right knee, not elsewhere classified: Secondary | ICD-10-CM | POA: Diagnosis not present

## 2023-04-20 DIAGNOSIS — M6281 Muscle weakness (generalized): Secondary | ICD-10-CM | POA: Diagnosis not present

## 2023-04-20 DIAGNOSIS — R262 Difficulty in walking, not elsewhere classified: Secondary | ICD-10-CM | POA: Insufficient documentation

## 2023-04-20 NOTE — Therapy (Signed)
OUTPATIENT PHYSICAL THERAPY LOWER EXTREMITY TREATMENT NOTE   Patient Name: Jade Hernandez MRN: [id number] DOB:1941-01-29, 82 y.o., female Today's Date: 04/20/2023  END OF SESSION:  PT End of Session - 04/20/23 1531     Visit Number 9    Date for PT Re-Evaluation 05/12/23    Authorization Type MEDICARE PART A AND B    Progress Note Due on Visit 10    PT Start Time 1532    PT Stop Time 1615    PT Time Calculation (min) 43 min    Activity Tolerance Patient tolerated treatment well;No increased pain              Past Medical History:  Diagnosis Date   Adenomatous colon polyp    Arthritis    Crohn's disease of both small and large intestine with other complication (HCC) 10/23/2020   Diverticulosis    High cholesterol    Hypertension    IBS (irritable bowel syndrome)    Palpitations    Pre-diabetes    UTI (urinary tract infection)    Past Surgical History:  Procedure Laterality Date   BILATERAL CARPAL TUNNEL RELEASE     COLONOSCOPY     NM MYOCAR PERF WALL MOTION  10/02/2007   No significant ischemia demonstrated   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   Patient Active Problem List   Diagnosis Date Noted   Primary osteoarthritis of right knee 03/14/2023   Long-term use of immunosuppressant medication _ Entyvio 02/16/2021   Iron deficiency anemia due to chronic blood loss 11/25/2020   Crohn's disease of both small and large intestine with other complication (HCC) 10/23/2020   OA  (osteoarthritis) of knee 02/21/2017   History of palpitations 04/22/2015   Hyperlipidemia 01/11/2014   GERD (gastroesophageal reflux disease) 01/11/2014   Family history of coronary artery disease 01/11/2014   Osteoarthritis of knee 06/17/2012   Hypertension     PCP: [name], MD   REFERRING PROVIDER: Ollen Gross, MD  REFERRING DIAG: [phone] (ICD-10-CM) - Presence of right artificial knee joint  THERAPY DIAG:  Acute pain of right knee  Stiffness of right knee, not elsewhere classified  Difficulty in walking, not elsewhere classified  Rationale for Evaluation and Treatment: Rehabilitation  ONSET DATE: 02/16/2023  SUBJECTIVE:   SUBJECTIVE STATEMENT: Saw the doctor yesterday and he said I could drive.  He measured my knee at 120 degrees.  He said I just needed 2 more times of PT then I could finish.    PERTINENT HISTORY: Hx of left TKA PAIN:  PAIN:    Are you having pain? Yes NPRS scale: 1-2/10 I took 2 Tylenol Pain location: Rt knee  Pain description: intermittent and aching  Aggravating factors: standing, bending knee, activity  Relieving factors: ice, pain medication   PRECAUTIONS: None  WEIGHT BEARING RESTRICTIONS: No  FALLS:  Has patient fallen in last 6 months? No  LIVING ENVIRONMENT: Lives with: lives with their spouse Lives in: House/apartment Stairs: Yes: Internal: 12 steps; on right going up and External: 5 steps; on  right going up Has following equipment at home: Single point cane and Walker - 2 wheeled  OCCUPATION: Retired  PLOF: Independent, Independent with basic ADLs, Independent with household mobility without device, Independent with community mobility without device, Independent with homemaking with ambulation, Independent with gait, and Independent with transfers  PATIENT GOALS: To return to her prior level of function  NEXT MD VISIT: 2 weeks  OBJECTIVE:   DIAGNOSTIC FINDINGS: na  PATIENT SURVEYS:  FOTO  t  COGNITION: Overall cognitive status: Within functional limits for tasks assessed     SENSATION: WFL  EDEMA:  Circumferential: Mid patella  POSTURE: No Significant postural limitations  PALPATION: Tender over anterior knee and lateral knee  LOWER EXTREMITY ROM:  Active ROM Right eval Left eval 5/21 5/30  Knee flexion 48  Seated 92 Supine 105  Knee extension -10  -8 supine Supine -5   (Blank rows = not tested)  LOWER EXTREMITY MMT: (held on initial eval due to 4 days post op)  MMT Right eval Left eval  Hip external rotation    Knee flexion    Knee extension     (Blank rows = not tested)   FUNCTIONAL TESTS:  03/21/23: 5 times sit to stand: 21.3 seconds  Timed up and go (TUG): 36.48 second with walker   GAIT: Distance walked: 30 Assistive device utilized: Environmental consultant - 2 wheeled Level of assistance: SBA Comments: antalgic, slow and guarded   TODAY'S TREATMENT:      6/5:  NuStep: level 3 x 5 minutes-PT present to discuss progress 2nd step stretch for flexion and extension 10x Manual therapy: seated knee flexion with mild manual overpressure 20x; seated passive extension 5x; patellar mobs 4 ways Seated 4# 15x LAQ Seated green band hamstring curls 15x 4 inch lateral step ups right x 10 with left UE support of railing and left hip abduction  Heel and toe alternating raises 10x WB on right with left hip flexion to 8 inch step stool 10x Leg press 70# bil 15x seat 7 (reclined back extra); 35# right only 15x Seated red power cord press out transition to simulate gas pedal and brake for driving 40J Game Ready: med compression, 3 snowflakes x10 min to Rt knee     5/30:  NuStep: level 3 x 5 minutes-PT present to discuss progress Manual therapy: supine HS contract relax 3x 5 sec holds passive HS stretch 5x, supine knee flexion ROM 10x Knee ROM  Seated red power cord gas to brake back and forth to simulate driving  4 inch step ups right x 10 with left UE support of  railing 2 inch step downs x 10 right left single arm support of railing WB on right with left hip 4 ways 5x (very fatiguing) Leg press 60# bil 20x seat 7 (reclined back extra) Game Ready: med compression, 3 snowflakes x10 min to Rt knee      DATE:5/28:  NuStep: level 3 x 5 minutes-PT present to discuss progress  Manual therapy: supine HS contract relax 3x 5 sec holds passive HS stretch 5x, supine knee flexion ROM 10x;  Seated red power cord gas to brake back and forth to simulate driving Standing hamstring curl at bar: 2 x 10 Right foot in chair dynamic flexion x 20 4 inch step ups right x 20 with left UE support of railing 4 inch step downs x 10 right left single arm support of railing Lateral band walks 4 laps with blue loop Seated clam x 20 with blue loop  Seated heel slides using slider x 10 Long arc quads 3# 2 x 10 Educated/reminded patient on need to sit with leg propped in extension and trying to sleep in bed as soon as possible.  Warned patient that postural issues will ensue if she continues to sleep in a recliner for a long period of time.   Game Ready: med compression, 3 snowflakes x10 min to Rt knee    PATIENT EDUCATION:  Education details: Initiated HEP and discussed pain control Person educated: Patient Education method: Medical illustrator Education comprehension: verbalized understanding, returned demonstration, and verbal cues required  HOME EXERCISE PROGRAM: Access Code: [facility] URL: [email or web address] Date: 04/05/2023 Prepared by: Lavinia Sharps  Exercises - Seated Knee Extension Stretch with Chair  - 1 x daily - 7 x weekly - 1 sets - 1 reps - work up to 30 min hold - Supine Knee Extension Stretch on Towel Roll  - 1 x daily - 7 x weekly - 1 sets - 1 reps - work up to 30 min hold - Long Sitting Quad Set  - 1 x daily - 7 x weekly - 3 sets - 10 reps - Supine Heel Slide  - 1 x daily - 7 x weekly - 3 sets - 10 reps - Supine Heel Slide  with Strap  - 1 x daily - 7 x weekly - 3 sets - 10 reps - Seated Knee Flexion Extension AROM   - 2 x daily - 7 x weekly - 1 sets - 10 reps - 5 hold - Standing Knee Flexion Stretch on Step  - 5 x daily - 7 x weekly - 1 sets - 10 reps - Standing Hamstring Stretch with Step  - 5 x daily - 7 x weekly - 1 sets - 10 reps  ASSESSMENT:  CLINICAL IMPRESSION: Steady improvements in functional mobility, knee flexion and extension ROM, quad motor control and weight bearing tolerance.  Some pelvic drop noted with single leg weight bearing on right as well as single arm UE support.  Knee edema well managed with vascompression, ankle edema persists and pt plans to discuss with her cardiologist.  Therapist providing verbal cues to optimize technique with exercises in order to achieve the greatest benefit.   OBJECTIVE IMPAIRMENTS: Abnormal gait, decreased balance, decreased endurance, difficulty walking, decreased ROM, decreased strength, increased edema, increased fascial restrictions, increased muscle spasms, impaired flexibility, and pain.   ACTIVITY LIMITATIONS: carrying, lifting, bending, sitting, standing, squatting, sleeping, stairs, transfers, bed mobility, bathing, toileting, dressing, and hygiene/grooming  PARTICIPATION LIMITATIONS: meal prep, cleaning, laundry, interpersonal relationship, driving, shopping, community activity, occupation, yard work, and church  PERSONAL FACTORS: Age, Fitness, and 1-2 comorbidities: Crohn's, OA, Htn  are also affecting patient's functional outcome.   REHAB POTENTIAL: Good  CLINICAL DECISION MAKING: Stable/uncomplicated  EVALUATION COMPLEXITY: Low   GOALS: Goals reviewed with patient? Yes  SHORT TERM GOALS: Target date: 04/14/2023  Pain report to be no greater than 4/10  Baseline: Goal status: MET  2.  Patient will be independent with initial HEP  Baseline:  Goal status: met 5/30  3.  Extension ROM to improve to 0 Baseline:  Goal status:partially  met  4.  Flexion ROM to improve to 90 degrees Baseline:  Goal status: met 5/21  5.  Patient to be able to walk with walker with step through gait Baseline:  Goal status: met 5/21  LONG TERM GOALS: Target date: 05/12/2023   Patient to be independent with advanced HEP  Baseline:  Goal status:  INITIAL  2.  Patient to report pain no greater than 2/10  Baseline:  Goal status: INITIAL  3.  Functional ROM of left knee (no > -5 degrees ext and at least 110 degrees of flexion) Baseline:  Goal status: INITIAL  4.  FOTO to improve to predicted score Baseline:  Goal status: INITIAL  5.  Patient to be able to stand or walk for at least 15 min without a.d. as reported by patient  Baseline:  Goal status: INITIAL  6.  Functional scores to improve by 5-10 sec Baseline:  Goal status: INITIAL   PLAN:  PT FREQUENCY: 2x/week  PT DURATION: 8 weeks  PLANNED INTERVENTIONS: Therapeutic exercises, Therapeutic activity, Neuromuscular re-education, Balance training, Gait training, Patient/Family education, Self Care, Joint mobilization, Stair training, DME instructions, Aquatic Therapy, Dry Needling, Electrical stimulation, Cryotherapy, Moist heat, Taping, Vasopneumatic device, Manual therapy, and Re-evaluation  PLAN FOR NEXT SESSION:  2 more visits then discharge; recheck ROM (doctor measured 120 degrees yesterday); finalize HEP; Continue Nustep, walk test, progress HEP, quad and HS strengthening, ROM, gait training, Game Ready  Lavinia Sharps, PT 04/20/23 4:17 PM Phone: [phone] Fax: [phone]   [facility] [address] [place], [place] [address] Phone # [phone] Fax [phone]

## 2023-04-21 ENCOUNTER — Ambulatory Visit: Payer: Medicare Other

## 2023-04-25 NOTE — Progress Notes (Unsigned)
Cardiology Clinic Note   Date: 04/27/2023 ID: Jade, Hernandez 06/18/1941, MRN [id number]  Primary Cardiologist:  Nicki Guadalajara, MD  Patient Profile    Jade Hernandez is a 82 y.o. female who presents to the clinic today for questions about medications.  Past medical history significant for: Aortic dilatation. Echo 02/07/2023: EF 60 to 65%.  Mild LVH.  Normal RV function.  Mild LAE.  Trivial MR.  Mild AI.  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 Hypertension. Hyperlipidemia. Palpitations. GERD. Crohn's disease. Vedolizumab infusions every 8 weeks.    History of Present Illness    Jade Hernandez is a longtime patient of cardiology.  She is followed by Dr. Tresa Endo for the above outlined history.  Patient was last seen in the office by Bernadene Person, NP on 02/17/2023 for preop evaluation for upcoming right knee total arthroplasty.  She was stable from a cardiac standpoint with reports of occasional low BP and lightheadedness.  Amlodipine was decreased to 2.5 mg daily.  She was found to be an acceptable risk for upcoming surgery.  Patient underwent knee replacement 03/14/2023.  She was discharged from hospital 03/16/2023.  She contacted the office on 04/13/2023 reporting amlodipine was stopped during her hospitalization for knee replacement secondary to low BP.  Per triage notes: Spoke to patient she stated while she was hospital recently with a knee replacement B/P low.Stated Amlodipine was stopped.Appointment scheduled with Carlos Levering NP 6/12 at 2:45 pm.B/P last week at home 135/65.B/P at present 148/78 pulse 76.Patient will monitor B/P daily and bring readings to appointment.She will call sooner if B/P elevated.   Today, patient is accompanied by her daughter.  She would like to know if she should start back on amlodipine.  She reports it was stopped in the hospital when she had her knee replacement because her BP was low.  Recent BP readings are mostly >130/80.  She denies  headaches, dizziness, vision changes.  She states her physical therapist was concerned about R>L lower extremity edema.  She and her daughter both agree lower extremities are unchanged from previous.  Patient states "I always have thick legs."  She reports dietary sodium restriction.  Patient denies shortness of breath, DOE, orthopnea, PND.  No chest pain, tightness, pressure.  ROS: All other systems reviewed and are otherwise negative except as noted in History of Present Illness.  Studies Reviewed    ECG is not ordered today.      Physical Exam    VS:  BP 132/86   Pulse 78   Ht 5\' 3"  (1.6 m)   Wt 172 lb 3.2 oz (78.1 kg)   SpO2 98%   BMI 30.50 kg/m  , BMI Body mass index is 30.5 kg/m.  GEN: Well nourished, well developed, in no acute distress. Neck: No JVD or carotid bruits. Cardiac:  RRR. No murmurs. No rubs or gallops.   Respiratory:  Respirations regular and unlabored. Clear to auscultation without rales, wheezing or rhonchi. GI: Soft, nontender, nondistended. Extremities: Radials/DP/PT 2+ and equal bilaterally. No clubbing or cyanosis.  Moderate nonpitting lower extremity edema, equal bilaterally. Skin: Warm and dry, no rash. Neuro: Strength intact.  Assessment & Plan    Hypertension. BP today 132/86.  Recent home BP consistently >130/80.  Patient denies headaches, dizziness or vision changes.  Patient will restart amlodipine at 2.5 mg daily.  Continue metoprolol, valsartan.  Discussed low blood pressure and symptoms to be concerned about.  Patient and daughter expressed understanding. Lower extremity edema.  Patient's physical therapist was concerned about R>L lower extremity edema.  Patient underwent right knee replacement on 03/14/2023 and continues to have edema around her knee.  She and her daughter agree that her legs look as they normally do.  Denies shortness of breath, DOE, orthopnea, PND.  Patient will bring her legs up in her recliner but she has not elevating her  right leg.  Patient has moderate nonpitting edema bilateral lower extremities that is essentially equal in both extremities.  Instructed patient to begin elevating her legs as frequently as possible.  Disposition: Restart amlodipine 2.5 mg daily.  Return in 5 to 6 months or sooner as needed.         Signed, Etta Grandchild. Marisel Tostenson, DNP, NP-C

## 2023-04-26 ENCOUNTER — Ambulatory Visit (INDEPENDENT_AMBULATORY_CARE_PROVIDER_SITE_OTHER): Payer: Medicare Other | Admitting: Internal Medicine

## 2023-04-26 ENCOUNTER — Encounter: Payer: Self-pay | Admitting: Internal Medicine

## 2023-04-26 VITALS — BP 120/70 | HR 75 | Ht 63.0 in | Wt 170.6 lb

## 2023-04-26 DIAGNOSIS — R710 Precipitous drop in hematocrit: Secondary | ICD-10-CM

## 2023-04-26 DIAGNOSIS — Z7960 Long term (current) use of unspecified immunomodulators and immunosuppressants: Secondary | ICD-10-CM

## 2023-04-26 DIAGNOSIS — D500 Iron deficiency anemia secondary to blood loss (chronic): Secondary | ICD-10-CM

## 2023-04-26 DIAGNOSIS — K5080 Crohn's disease of both small and large intestine without complications: Secondary | ICD-10-CM

## 2023-04-26 NOTE — Progress Notes (Signed)
Jade Hernandez 82 y.o. 11/04/1941 [id number]  Assessment & Plan:   Encounter Diagnoses  Name Primary?   Crohn's disease of both small and large intestine without complication (HCC) Yes   Long-term use of immunosuppressant medication _ Entyvio    Iron deficiency anemia due to chronic blood loss    Decreased hemoglobin    She is doing well we will continue Entyvio 300 mg every 8 weeks.  Monitor for complications ongoing, no signs of this at present.  She is due for a QuantiFERON in December.  She prefers to have Dr. Leeanne Deed I will do follow-up labs and she is seeing her soon.  She does need a hemoglobin rechecked.  I do not think it is in her best interest to continue aspirin long-term, however in the perioperative period to reduce the risk of DVT it is fine.  She should ask Dr. Lequita Halt if it is okay to stop when she follows up soon.  I think it would be with respect to DVT prophylaxis.  RTC 6 mos  CC: Jade Raider, MD Dr. Ollen Gross  Subjective:  Gastroenterology summary:  Crohn's ileocolitis Colonoscopy 01/2019 acute changes not chronic-ileitis and nonspecific colitis changes on random biopsies - Tx x 2 mos budesonide 9 mg w/ success Recurrent problems, diagnosed with Crohn's disease required prednisone over budesonide in 2021 -CT abdomen pelvis with contrast 07/22/2020 mild to moderate enterocolitis with diffuse involvement of the distal ileum and colon -Entyvio begun 03/04/2021 and has done well on every 8 weeks 300 mg  Associated issues include B12 deficiency and iron deficiency anemia  Chief Complaint: Follow-up Crohn's ileocolitis doing well on Entyvio  HPI 82 year old [place] woman with a history of Crohn's disease as outlined above, also with hypertension, osteoarthritis status post right TKR by Dr. Berton Hernandez March 14, 2023 presents for follow-up with her daughter Jade Hernandez present.  She is not having any diarrhea or abdominal pain or bleeding issues.  However if she  does eat applesauce she will have diarrhea.  She also likes to drink V8 juice and wonders if that is okay.  Amlodipine was stopped when she was in the hospital due to some lower blood pressures and she is due to follow-up with cardiology soon.  Does have some orthostasis symptoms at times. She is due to see Dr. Clelia Croft of primary care within the next few weeks.  Postop hemoglobin was 10 and 9.4 on April 30 and May 1, respectively. Allergies  Allergen Reactions   Feraheme [Ferumoxytol] Shortness Of Breath and Palpitations    Severe pain all over   Current Meds  Medication Sig   acetaminophen (TYLENOL) 500 MG tablet Take 1,000 mg by mouth in the morning and at bedtime.   metoprolol succinate (TOPROL XL) 100 MG 24 hr tablet Take 1 tablet (100 mg total) by mouth daily. Take with or immediately following a meal.   Probiotic Product (PROBIOTIC DAILY PO) Take 1 capsule by mouth in the morning.   simvastatin (ZOCOR) 40 MG tablet Take 40 mg by mouth every evening.   sodium chloride 0.9 % SOLN 250 mL with vedolizumab 300 MG SOLR 300 mg Inject 300 mg into the vein every 8 (eight) weeks.   valsartan (DIOVAN) 160 MG tablet Take 1 tablet (160 mg total) by mouth daily.   Past Medical History:  Diagnosis Date   Adenomatous colon polyp    Arthritis    Crohn's disease of both small and large intestine with other complication (HCC) 10/23/2020   Diverticulosis  High cholesterol    Hypertension    IBS (irritable bowel syndrome)    Palpitations    Pre-diabetes    UTI (urinary tract infection)    Past Surgical History:  Procedure Laterality Date   BILATERAL CARPAL TUNNEL RELEASE     COLONOSCOPY     NM MYOCAR PERF WALL MOTION  10/02/2007   No significant ischemia demonstrated   TOTAL KNEE ARTHROPLASTY Left 02/21/2017   Procedure: LEFT TOTAL KNEE ARTHROPLASTY;  Surgeon: Ollen Gross, MD;  Location: [facility];  Service: Orthopedics;  Laterality: Left;  requests ; Adductor Block   TOTAL KNEE  ARTHROPLASTY Right 03/14/2023   Procedure: TOTAL KNEE ARTHROPLASTY;  Surgeon: Ollen Gross, MD;  Location: [facility];  Service: Orthopedics;  Laterality: Right;   US ECHOCARDIOGRAPHY  10/29/2010   Proximal septal thickening noted, EF =>55%,LA mildly dilated,mild mitral annular ca+, AOV mildly sclerotic, mild AI.   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   Review of Systems As per HPI  Objective:   Physical Exam BP 120/70   Pulse 75   Ht 5\' 3"  (1.6 m)   Wt 170 lb 9.6 oz (77.4 kg)   BMI 30.22 kg/m  Elderly  woman in no acute distress Lungs are clear Heart sounds are normal The abdomen is soft and nontender and bowel sounds are present  Data reviewed includes labs and hospitalization records from total knee replacement in April as well as summary of primary care in care everywhere including labs from 2023.  33 minutes total time

## 2023-04-26 NOTE — Patient Instructions (Addendum)
Glad you are doing well.  Please ask Dr. Berton Lan about stopping the aspirin.  Dr. Clelia Croft can recheck hemoglobin as we discussed.  Avoid foods that cause diarrhea like the applesauce and would limit V8 to a rare treat  Please contact us in October to make a December 2024 or Jan 2025 appointment  I appreciate the opportunity to care for you. Iva Boop, MD, Clementeen Graham

## 2023-04-27 ENCOUNTER — Ambulatory Visit: Payer: Medicare Other | Attending: Student | Admitting: Student

## 2023-04-27 ENCOUNTER — Ambulatory Visit: Payer: Medicare Other | Admitting: Physical Therapy

## 2023-04-27 ENCOUNTER — Encounter: Payer: Self-pay | Admitting: Student

## 2023-04-27 VITALS — BP 132/86 | HR 78 | Ht 63.0 in | Wt 172.2 lb

## 2023-04-27 DIAGNOSIS — R600 Localized edema: Secondary | ICD-10-CM | POA: Insufficient documentation

## 2023-04-27 DIAGNOSIS — M6281 Muscle weakness (generalized): Secondary | ICD-10-CM

## 2023-04-27 DIAGNOSIS — M25561 Pain in right knee: Secondary | ICD-10-CM

## 2023-04-27 DIAGNOSIS — R262 Difficulty in walking, not elsewhere classified: Secondary | ICD-10-CM

## 2023-04-27 DIAGNOSIS — M25661 Stiffness of right knee, not elsewhere classified: Secondary | ICD-10-CM | POA: Diagnosis not present

## 2023-04-27 DIAGNOSIS — I10 Essential (primary) hypertension: Secondary | ICD-10-CM | POA: Insufficient documentation

## 2023-04-27 NOTE — Therapy (Signed)
OUTPATIENT PHYSICAL THERAPY LOWER EXTREMITY TREATMENT NOTE   Patient Name: Jade Hernandez MRN: [id number] DOB:06/13/1941, 82 y.o., female Today's Date: 04/27/2023  Progress Note Reporting Period 03/17/23 to 04/27/23  See note below for Objective Data and Assessment of Progress/Goals.      END OF SESSION:  PT End of Session - 04/27/23 1054     Visit Number 10    Date for PT Re-Evaluation 05/12/23    Authorization Type MEDICARE PART A AND B    Progress Note Due on Visit 20    PT Start Time 1055    PT Stop Time 1143    PT Time Calculation (min) 48 min    Activity Tolerance Patient tolerated treatment well              Past Medical History:  Diagnosis Date   Adenomatous colon polyp    Arthritis    Crohn's disease of both small and large intestine with other complication (HCC) 10/23/2020   Diverticulosis    High cholesterol    Hypertension    IBS (irritable bowel syndrome)    Palpitations    Pre-diabetes    UTI (urinary tract infection)    Past Surgical History:  Procedure Laterality Date   BILATERAL CARPAL TUNNEL RELEASE     COLONOSCOPY     NM MYOCAR PERF WALL MOTION  10/02/2007   No significant ischemia demonstrated   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   Patient Active Problem List   Diagnosis Date Noted   Primary osteoarthritis of right knee 03/14/2023   Long-term use of immunosuppressant medication _ Entyvio 02/16/2021   Iron deficiency anemia due to chronic blood loss  11/25/2020   Crohn's disease of both small and large intestine with other complication (HCC) 10/23/2020   OA (osteoarthritis) of knee 02/21/2017   History of palpitations 04/22/2015   Hyperlipidemia 01/11/2014   GERD (gastroesophageal reflux disease) 01/11/2014   Family history of coronary artery disease 01/11/2014   Osteoarthritis of knee 06/17/2012   Hypertension     PCP: [name], MD   REFERRING PROVIDER: Ollen Gross, MD  REFERRING DIAG: [phone] (ICD-10-CM) - Presence of right artificial knee joint  THERAPY DIAG:  Acute pain of right knee  Stiffness of right knee, not elsewhere classified  Difficulty in walking, not elsewhere classified  Muscle weakness (generalized)  Rationale for Evaluation and Treatment: Rehabilitation  ONSET DATE: 02/16/2023  SUBJECTIVE:   SUBJECTIVE STATEMENT: I take Tylenol 2x/day.  I haven't tried driving yet.  Tightness after sitting a while.  Able to carry the laundry on the stairs.    PERTINENT HISTORY: Hx of left TKA PAIN:  PAIN:    Are you having pain? Yes NPRS scale: 1-2/10  2 Tylenol Pain location: Rt knee  Pain description: intermittent and aching  Aggravating factors: standing, bending knee, activity  Relieving factors: ice, pain medication   PRECAUTIONS: None  WEIGHT BEARING RESTRICTIONS: No  FALLS:  Has patient fallen in last 6 months? No  LIVING ENVIRONMENT: Lives with: lives with their spouse Lives  in: House/apartment Stairs: Yes: Internal: 12 steps; on right going up and External: 5 steps; on right going up Has following equipment at home: Single point cane and Walker - 2 wheeled  OCCUPATION: Retired  PLOF: Independent, Independent with basic ADLs, Independent with household mobility without device, Independent with community mobility without device, Independent with homemaking with ambulation, Independent with gait, and Independent with transfers  PATIENT GOALS: To return to her prior level of  function  NEXT MD VISIT: 2 weeks  OBJECTIVE:   DIAGNOSTIC FINDINGS: na  PATIENT SURVEYS:  FOTO 28% 6/12:  63%  COGNITION: Overall cognitive status: Within functional limits for tasks assessed     SENSATION: WFL  EDEMA:  Circumferential: Mid patella  POSTURE: No Significant postural limitations  PALPATION: Tender over anterior knee and lateral knee  LOWER EXTREMITY ROM:  Active ROM Right eval Left eval 5/21 5/30 6/12  Knee flexion 48  Seated 92 Supine 105 120 supine  Knee extension -10  -8 supine Supine -5 -4 supine   (Blank rows = not tested)  LOWER EXTREMITY MMT:  MMT Right 6/12 Left eval  Hip external rotation    Knee flexion 4   Knee extension 4-    (Blank rows = not tested)   FUNCTIONAL TESTS:  03/21/23: 5 times sit to stand: 21.3 seconds  Timed up and go (TUG): 36.48 second with walker   6/12:  5x sit to stand: 17.12 no hands TUG: no device 13.94  GAIT: Distance walked: 30 Assistive device utilized: Environmental consultant - 2 wheeled Level of assistance: SBA Comments: antalgic, slow and guarded   TODAY'S TREATMENT:    6/12:  NuStep: level 3 x 5 minutes-PT present to discuss progress FOTO 5x STS TUG LAQ 5# 2x10 HS green band 2x10 2nd step stretch for flexion and extension 10x each 6 inch step ups with left using bil UE support 10x WB on right with 1st and 2nd step taps 10x Update of HEP Game Ready: med compression, 3 snowflakes x10 min to Rt knee     6/5:  NuStep: level 3 x 5 minutes-PT present to discuss progress 2nd step stretch for flexion and extension 10x Manual therapy: seated knee flexion with mild manual overpressure 20x; seated passive extension 5x; patellar mobs 4 ways Seated 4# 15x LAQ Seated green band hamstring curls 15x 4 inch lateral step ups right x 10 with left UE support of railing and left hip abduction  Heel and toe alternating raises 10x WB on right with left hip flexion to 8 inch step stool 10x Leg press 70# bil 15x seat 7  (reclined back extra); 35# right only 15x Seated red power cord press out transition to simulate gas pedal and brake for driving 16X Game Ready: med compression, 3 snowflakes x10 min to Rt knee     5/30:  NuStep: level 3 x 5 minutes-PT present to discuss progress Manual therapy: supine HS contract relax 3x 5 sec holds passive HS stretch 5x, supine knee flexion ROM 10x Knee ROM  Seated red power cord gas to brake back and forth to simulate driving  4 inch step ups right x 10 with left UE support of railing 2 inch step downs x 10 right left single arm support of railing WB on right with left hip 4 ways 5x (very fatiguing) Leg press 60# bil 20x seat 7 (reclined back extra) Game Ready: med compression, 3 snowflakes x10 min to Rt knee     PATIENT EDUCATION:  Education details: Initiated HEP  and discussed pain control Person educated: Patient Education method: Explanation and Demonstration Education comprehension: verbalized understanding, returned demonstration, and verbal cues required  HOME EXERCISE PROGRAM: Access Code: [facility] URL: [email or web address] Date: 04/05/2023 Prepared by: Lavinia Sharps  Exercises - Seated Knee Extension Stretch with Chair  - 1 x daily - 7 x weekly - 1 sets - 1 reps - work up to 30 min hold - Supine Knee Extension Stretch on Towel Roll  - 1 x daily - 7 x weekly - 1 sets - 1 reps - work up to 30 min hold - Long Sitting Quad Set  - 1 x daily - 7 x weekly - 3 sets - 10 reps - Supine Heel Slide  - 1 x daily - 7 x weekly - 3 sets - 10 reps - Supine Heel Slide with Strap  - 1 x daily - 7 x weekly - 3 sets - 10 reps - Seated Knee Flexion Extension AROM   - 2 x daily - 7 x weekly - 1 sets - 10 reps - 5 hold - Standing Knee Flexion Stretch on Step  - 5 x daily - 7 x weekly - 1 sets - 10 reps - Standing Hamstring Stretch with Step  - 5 x daily - 7 x weekly - 1 sets - 10 reps  ASSESSMENT:  CLINICAL IMPRESSION: Significant improvements with  functional tests 5x sit to stand and Timed up and go speeds.  FOTO functional outcome score much improved as well.  ROM increased to 4-120 in supine.  Quad strength deficit makes step ups leading with surgical leg difficult without bil UE support.  Treatment focus on progressing and finalizing HEP.  Therapist providing verbal cues to optimize technique with  exercises in order to achieve the greatest benefit.     OBJECTIVE IMPAIRMENTS: Abnormal gait, decreased balance, decreased endurance, difficulty walking, decreased ROM, decreased strength, increased edema, increased fascial restrictions, increased muscle spasms, impaired flexibility, and pain.   ACTIVITY LIMITATIONS: carrying, lifting, bending, sitting, standing, squatting, sleeping, stairs, transfers, bed mobility, bathing, toileting, dressing, and hygiene/grooming  PARTICIPATION LIMITATIONS: meal prep, cleaning, laundry, interpersonal relationship, driving, shopping, community activity, occupation, yard work, and church  PERSONAL FACTORS: Age, Fitness, and 1-2 comorbidities: Crohn's, OA, Htn  are also affecting patient's functional outcome.   REHAB POTENTIAL: Good  CLINICAL DECISION MAKING: Stable/uncomplicated  EVALUATION COMPLEXITY: Low   GOALS: Goals reviewed with patient? Yes  SHORT TERM GOALS: Target date: 04/14/2023  Pain report to be no greater than 4/10  Baseline: Goal status: MET  2.  Patient will be independent with initial HEP  Baseline:  Goal status: met 5/30  3.  Extension ROM to improve to 0 Baseline:  Goal status:partially met  4.  Flexion ROM to improve to 90 degrees Baseline:  Goal status: met 5/21  5.  Patient to be able to walk with walker with step through gait Baseline:  Goal status: met 5/21  LONG TERM GOALS: Target date: 05/12/2023   Patient to be independent with advanced HEP  Baseline:  Goal status: ongoing  2.  Patient to report pain no greater than 2/10  Baseline:  Goal status: met  6/12  3.  Functional ROM of left knee (no > -5 degrees ext and at least 110 degrees of flexion) Baseline:  Goal status: met 6/12  4.  FOTO to improve to predicted score Baseline:  Goal status: ongoing  5.  Patient to be able to stand or walk for at least 15 min without  a.d. as reported by patient  Baseline:  Goal status: met 6/12  6.  Functional scores to improve by 5-10 sec Baseline:  Goal status: met 6/12  PLAN:  PT FREQUENCY: 2x/week  PT DURATION: 8 weeks  PLANNED INTERVENTIONS: Therapeutic exercises, Therapeutic activity, Neuromuscular re-education, Balance training, Gait training, Patient/Family education, Self Care, Joint mobilization, Stair training, DME instructions, Aquatic Therapy, Dry Needling, Electrical stimulation, Cryotherapy, Moist heat, Taping, Vasopneumatic device, Manual therapy, and Re-evaluation  PLAN FOR NEXT SESSION:   discharge expected next visit;  finalize HEP; Nustep, quad and HS strengthening, ROM, gait training, Game Ready   Lavinia Sharps, PT 04/27/23 11:53 AM Phone: [phone] Fax: [phone]  [facility] [address] [place], [place] [address] Phone # [phone] Fax [phone]

## 2023-04-27 NOTE — Patient Instructions (Signed)
Medication Instructions:  RESTART Amlodipine 2.5 mg daily   *If you need a refill on your cardiac medications before your next appointment, please call your pharmacy*   Follow-Up: At [facility], you and your health needs are our priority.  As part of our continuing mission to provide you with exceptional heart care, we have created designated Provider Care Teams.  These Care Teams include your primary Cardiologist (physician) and Advanced Practice Providers (APPs -  Physician Assistants and Nurse Practitioners) who all work together to provide you with the care you need, when you need it.  We recommend signing up for the patient portal called "MyChart".  Sign up information is provided on this After Visit Summary.  MyChart is used to connect with patients for Virtual Visits (Telemedicine).  Patients are able to view lab/test results, encounter notes, upcoming appointments, etc.  Non-urgent messages can be sent to your provider as well.   To learn more about what you can do with MyChart, go to [email or web address].    Your next appointment:   January 2025  Provider:   Nicki Guadalajara, MD

## 2023-04-28 ENCOUNTER — Ambulatory Visit: Payer: Medicare Other | Admitting: Physical Therapy

## 2023-04-28 ENCOUNTER — Ambulatory Visit: Payer: Medicare Other

## 2023-05-04 ENCOUNTER — Encounter: Payer: Medicare Other | Admitting: Physical Therapy

## 2023-05-05 ENCOUNTER — Ambulatory Visit: Payer: Medicare Other | Admitting: Physical Therapy

## 2023-05-05 DIAGNOSIS — M6281 Muscle weakness (generalized): Secondary | ICD-10-CM | POA: Diagnosis not present

## 2023-05-05 DIAGNOSIS — M25661 Stiffness of right knee, not elsewhere classified: Secondary | ICD-10-CM | POA: Diagnosis not present

## 2023-05-05 DIAGNOSIS — R262 Difficulty in walking, not elsewhere classified: Secondary | ICD-10-CM

## 2023-05-05 DIAGNOSIS — M25561 Pain in right knee: Secondary | ICD-10-CM

## 2023-05-05 NOTE — Therapy (Signed)
OUTPATIENT PHYSICAL THERAPY LOWER EXTREMITY TREATMENT NOTE   Patient Name: Jade Hernandez MRN: [id number] DOB:12-03-1940, 82 y.o., female Today's Date: 05/05/2023    END OF SESSION:  PT End of Session - 05/05/23 1440     Visit Number 11    Date for PT Re-Evaluation 05/12/23    Authorization Type MEDICARE PART A AND B    Progress Note Due on Visit 20    PT Start Time 1443    PT Stop Time 1522    PT Time Calculation (min) 39 min    Activity Tolerance Patient tolerated treatment well              Past Medical History:  Diagnosis Date   Adenomatous colon polyp    Arthritis    Crohn's disease of both small and large intestine with other complication (HCC) 10/23/2020   Diverticulosis    High cholesterol    Hypertension    IBS (irritable bowel syndrome)    Palpitations    Pre-diabetes    UTI (urinary tract infection)    Past Surgical History:  Procedure Laterality Date   BILATERAL CARPAL TUNNEL RELEASE     COLONOSCOPY     NM MYOCAR PERF WALL MOTION  10/02/2007   No significant ischemia demonstrated   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   Patient Active Problem List   Diagnosis Date Noted   Primary osteoarthritis of right knee 03/14/2023   Long-term use of immunosuppressant medication _ Entyvio 02/16/2021   Iron deficiency anemia due to chronic blood loss 11/25/2020   Crohn's disease of both small and large intestine with other complication (HCC) 10/23/2020   OA (osteoarthritis)  of knee 02/21/2017   History of palpitations 04/22/2015   Hyperlipidemia 01/11/2014   GERD (gastroesophageal reflux disease) 01/11/2014   Family history of coronary artery disease 01/11/2014   Osteoarthritis of knee 06/17/2012   Hypertension     PCP: [name], MD   REFERRING PROVIDER: Ollen Gross, MD  REFERRING DIAG: [phone] (ICD-10-CM) - Presence of right artificial knee joint  THERAPY DIAG:  Acute pain of right knee  Stiffness of right knee, not elsewhere classified  Difficulty in walking, not elsewhere classified  Muscle weakness (generalized)  Rationale for Evaluation and Treatment: Rehabilitation  ONSET DATE: 02/16/2023  SUBJECTIVE:   SUBJECTIVE STATEMENT: My knee has been hurting more lately.  I'm trying not to take the Tylenol.  I don't use the cane much.  I do the exercises some.  Sees Dr. Despina Hick on July 10th.  Been sleeping in the bed the past 3 nights.    PERTINENT HISTORY: Hx of left TKA PAIN:  PAIN:    Are you having pain? Yes NPRS scale: 1-2/10  2 Tylenol Pain location: Rt knee  Pain description: intermittent and aching  Aggravating factors: standing, bending knee, activity  Relieving factors: ice, pain medication   PRECAUTIONS: None  WEIGHT BEARING RESTRICTIONS: No  FALLS:  Has patient fallen in last 6 months? No  LIVING ENVIRONMENT: Lives with: lives with their spouse Lives in:  House/apartment Stairs: Yes: Internal: 12 steps; on right going up and External: 5 steps; on right going up Has following equipment at home: Single point cane and Walker - 2 wheeled  OCCUPATION: Retired  PLOF: Independent, Independent with basic ADLs, Independent with household mobility without device, Independent with community mobility without device, Independent with homemaking with ambulation, Independent with gait, and Independent with transfers  PATIENT GOALS: To return to her prior level of function  NEXT MD VISIT: 2 weeks  OBJECTIVE:    DIAGNOSTIC FINDINGS: na  PATIENT SURVEYS:  FOTO 28% 6/12:  63% 6/20: 69%  COGNITION: Overall cognitive status: Within functional limits for tasks assessed     SENSATION: WFL  EDEMA:  Circumferential: Mid patella  POSTURE: No Significant postural limitations  PALPATION: Tender over anterior knee and lateral knee  LOWER EXTREMITY ROM:  Active ROM Right eval Left eval 5/21 5/30 6/12 6/20  Knee flexion 48  Seated 92 Supine 105 120 supine 123 degrees  Knee extension -10  -8 supine Supine -5 -4 supine 0   (Blank rows = not tested)  LOWER EXTREMITY MMT:  MMT Right 6/12 Left eval 6/20  Hip external rotation     Knee flexion 4  4+  Knee extension 4-  4   (Blank rows = not tested)   FUNCTIONAL TESTS:  03/21/23: 5 times sit to stand: 21.3 seconds  Timed up and go (TUG): 36.48 second with walker   6/12:  5x sit to stand: 17.12 no hands TUG: no device 13.94 6/20:  16.3 no hands  GAIT: Distance walked: 30 Assistive device utilized: Environmental consultant - 2 wheeled Level of assistance: SBA Comments: antalgic, slow and guarded   TODAY'S TREATMENT:  6/20:  NuStep: level 1 x 5 minutes-PT present to discuss progress 2nd step stretch for flexion and extension 10x each LAQ 5# 2x10 HS green band 2x10 6 inch step ups using single UE support 10x WB on right with single arm support 1st and 2nd step taps 10x Sit to stand: holding 5# 3 sets of 5 Knee ROM Discussed HEP progression and expected course of further recovery    6/12:  NuStep: level 3 x 5 minutes-PT present to discuss progress FOTO 5x STS TUG LAQ 5# 2x10 HS green band 2x10 2nd step stretch for flexion and extension 10x each 6 inch step ups using bil UE support 10x WB on right with 1st and 2nd step taps 10x Update of HEP Game Ready: med compression, 3 snowflakes x10 min to Rt knee     6/5:  NuStep: level 3 x 5 minutes-PT present to discuss progress 2nd step stretch for flexion and extension 10x Manual therapy:  seated knee flexion with mild manual overpressure 20x; seated passive extension 5x; patellar mobs 4 ways Seated 4# 15x LAQ Seated green band hamstring curls 15x 4 inch lateral step ups right x 10 with left UE support of railing and left hip abduction  Heel and toe alternating raises 10x WB on right with left hip flexion to 8 inch step stool 10x Leg press 70# bil 15x seat 7 (reclined back extra); 35# right only 15x Seated red power cord press out transition to simulate gas pedal and brake for driving 40J Game Ready: med compression, 3 snowflakes x10 min to Rt knee    PATIENT EDUCATION:  Education details: Initiated HEP and discussed pain control Person educated: Patient Education method: Medical illustrator Education comprehension: verbalized understanding, returned demonstration, and verbal cues required  HOME EXERCISE PROGRAM: Access Code:  [facility] URL: [email or web address] Date: 04/05/2023 Prepared by: Lavinia Sharps  Exercises - Seated Knee Extension Stretch with Chair  - 1 x daily - 7 x weekly - 1 sets - 1 reps - work up to 30 min hold - Supine Knee Extension Stretch on Towel Roll  - 1 x daily - 7 x weekly - 1 sets - 1 reps - work up to 30 min hold - Long Sitting Quad Set  - 1 x daily - 7 x weekly - 3 sets - 10 reps - Supine Heel Slide  - 1 x daily - 7 x weekly - 3 sets - 10 reps - Supine Heel Slide with Strap  - 1 x daily - 7 x weekly - 3 sets - 10 reps - Seated Knee Flexion Extension AROM   - 2 x daily - 7 x weekly - 1 sets - 10 reps - 5 hold - Standing Knee Flexion Stretch on Step  - 5 x daily - 7 x weekly - 1 sets - 10 reps - Standing Hamstring Stretch with Step  - 5 x daily - 7 x weekly - 1 sets - 10 reps  ASSESSMENT:  CLINICAL IMPRESSION: The patient has met the majority of rehab goals, with noted improvements in pain reduction, outcome score, ROM, strength and functional mobility.  A comprehensive HEP has been established and anticipate further  improvements over time with regular performance of the program.  Recommend discharge from PT at this time.      OBJECTIVE IMPAIRMENTS: Abnormal gait, decreased balance, decreased endurance, difficulty walking, decreased ROM, decreased strength, increased edema, increased fascial restrictions, increased muscle spasms, impaired flexibility, and pain.   ACTIVITY LIMITATIONS: carrying, lifting, bending, sitting, standing, squatting, sleeping, stairs, transfers, bed mobility, bathing, toileting, dressing, and hygiene/grooming  PARTICIPATION LIMITATIONS: meal prep, cleaning, laundry, interpersonal relationship, driving, shopping, community activity, occupation, yard work, and church  PERSONAL FACTORS: Age, Fitness, and 1-2 comorbidities: Crohn's, OA, Htn  are also affecting patient's functional outcome.   REHAB POTENTIAL: Good  CLINICAL DECISION MAKING: Stable/uncomplicated  EVALUATION COMPLEXITY: Low   GOALS: Goals reviewed with patient? Yes  SHORT TERM GOALS: Target date: 04/14/2023  Pain report to be no greater than 4/10  Baseline: Goal status: MET  2.  Patient will be independent with initial HEP  Baseline:  Goal status: met 5/30  3.  Extension ROM to improve to 0 Baseline:  Goal status:partially met  4.  Flexion ROM to improve to 90 degrees Baseline:  Goal status: met 5/21  5.  Patient to be able to walk with walker with step through gait Baseline:  Goal status: met 5/21  LONG TERM GOALS: Target date: 05/12/2023   Patient to be independent with advanced HEP  Baseline:  Goal status: met 2.  Patient to report pain no greater than 2/10  Baseline:  Goal status: met 6/12  3.  Functional ROM of left knee (no > -5 degrees ext and at least 110 degrees of flexion) Baseline:  Goal status: met 6/12  4.  FOTO to improve to predicted score Baseline:  Goal status: met  5.  Patient to be able to stand or walk for at least 15 min without a.d. as reported by patient   Baseline:  Goal status: met 6/12  6.  Functional scores to improve by 5-10 sec Baseline:  Goal status: met 6/12  PLAN: PHYSICAL THERAPY DISCHARGE SUMMARY  Visits from Start of Care: 11  Current functional level related to goals / functional outcomes: See  clinical impressions above   Remaining deficits: As above   Education / Equipment: HEP   Patient agrees to discharge. Patient goals were met. Patient is being discharged due to meeting the stated rehab goals.  Lavinia Sharps, PT 05/05/23 5:17 PM Phone: [phone] Fax: [phone]  [facility] [address], [address] [place], [place] [address] Phone # [phone] Fax [phone]

## 2023-05-11 ENCOUNTER — Ambulatory Visit: Payer: Medicare Other

## 2023-05-15 ENCOUNTER — Other Ambulatory Visit: Payer: Self-pay | Admitting: Nurse Practitioner

## 2023-05-20 DIAGNOSIS — R350 Frequency of micturition: Secondary | ICD-10-CM | POA: Diagnosis not present

## 2023-05-20 DIAGNOSIS — R351 Nocturia: Secondary | ICD-10-CM | POA: Diagnosis not present

## 2023-05-20 DIAGNOSIS — R8271 Bacteriuria: Secondary | ICD-10-CM | POA: Diagnosis not present

## 2023-05-20 DIAGNOSIS — R3915 Urgency of urination: Secondary | ICD-10-CM | POA: Diagnosis not present

## 2023-05-20 DIAGNOSIS — N3000 Acute cystitis without hematuria: Secondary | ICD-10-CM | POA: Diagnosis not present

## 2023-05-23 ENCOUNTER — Ambulatory Visit: Payer: Medicare Other | Admitting: Nurse Practitioner

## 2023-05-24 DIAGNOSIS — H5213 Myopia, bilateral: Secondary | ICD-10-CM | POA: Diagnosis not present

## 2023-05-24 DIAGNOSIS — H25812 Combined forms of age-related cataract, left eye: Secondary | ICD-10-CM | POA: Diagnosis not present

## 2023-06-01 ENCOUNTER — Ambulatory Visit (INDEPENDENT_AMBULATORY_CARE_PROVIDER_SITE_OTHER): Payer: Medicare Other

## 2023-06-01 VITALS — BP 120/70 | HR 64 | Temp 98.3°F | Resp 18 | Ht 63.0 in | Wt 171.2 lb

## 2023-06-01 DIAGNOSIS — K50818 Crohn's disease of both small and large intestine with other complication: Secondary | ICD-10-CM

## 2023-06-01 MED ORDER — VEDOLIZUMAB 300 MG IV SOLR
300.0000 mg | Freq: Once | INTRAVENOUS | Status: AC
  Administered 2023-06-01: 300 mg via INTRAVENOUS
  Filled 2023-06-01: qty 5

## 2023-06-01 NOTE — Progress Notes (Signed)
Diagnosis: Crohn's Disease  Provider:  Chilton Greathouse MD  Procedure: IV Infusion  IV Type: Peripheral, IV Location: L Antecubital  Entyvio (Vedolizumab), Dose: 300 mg  Infusion Start Time: 1146  Infusion Stop Time: 1220  Post Infusion IV Care: Peripheral IV Discontinued  Discharge: Condition: Good, Destination: Home . AVS Provided  Performed by:  Loney Hering, LPN

## 2023-06-18 ENCOUNTER — Encounter (HOSPITAL_COMMUNITY): Payer: Self-pay | Admitting: Internal Medicine

## 2023-06-18 ENCOUNTER — Other Ambulatory Visit: Payer: Self-pay

## 2023-06-18 ENCOUNTER — Inpatient Hospital Stay (HOSPITAL_BASED_OUTPATIENT_CLINIC_OR_DEPARTMENT_OTHER)
Admission: EM | Admit: 2023-06-18 | Discharge: 2023-06-21 | DRG: 439 | Disposition: A | Discharge status: Home / Self Care | Payer: Medicare Other | Attending: Internal Medicine | Admitting: Internal Medicine

## 2023-06-18 ENCOUNTER — Emergency Department (HOSPITAL_BASED_OUTPATIENT_CLINIC_OR_DEPARTMENT_OTHER): Payer: Medicare Other

## 2023-06-18 DIAGNOSIS — K2980 Duodenitis without bleeding: Secondary | ICD-10-CM | POA: Diagnosis present

## 2023-06-18 DIAGNOSIS — I10 Essential (primary) hypertension: Secondary | ICD-10-CM

## 2023-06-18 DIAGNOSIS — Z79899 Other long term (current) drug therapy: Secondary | ICD-10-CM

## 2023-06-18 DIAGNOSIS — K838 Other specified diseases of biliary tract: Secondary | ICD-10-CM | POA: Diagnosis present

## 2023-06-18 DIAGNOSIS — I5032 Chronic diastolic (congestive) heart failure: Secondary | ICD-10-CM | POA: Diagnosis present

## 2023-06-18 DIAGNOSIS — K828 Other specified diseases of gallbladder: Secondary | ICD-10-CM | POA: Diagnosis not present

## 2023-06-18 DIAGNOSIS — R1013 Epigastric pain: Secondary | ICD-10-CM

## 2023-06-18 DIAGNOSIS — Z86010 Personal history of colonic polyps: Secondary | ICD-10-CM

## 2023-06-18 DIAGNOSIS — N83291 Other ovarian cyst, right side: Secondary | ICD-10-CM | POA: Diagnosis not present

## 2023-06-18 DIAGNOSIS — Z6830 Body mass index (BMI) 30.0-30.9, adult: Secondary | ICD-10-CM | POA: Diagnosis not present

## 2023-06-18 DIAGNOSIS — K319 Disease of stomach and duodenum, unspecified: Secondary | ICD-10-CM

## 2023-06-18 DIAGNOSIS — I110 Hypertensive heart disease with heart failure: Secondary | ICD-10-CM | POA: Diagnosis present

## 2023-06-18 DIAGNOSIS — K50818 Crohn's disease of both small and large intestine with other complication: Secondary | ICD-10-CM | POA: Diagnosis not present

## 2023-06-18 DIAGNOSIS — K859 Acute pancreatitis without necrosis or infection, unspecified: Secondary | ICD-10-CM | POA: Diagnosis present

## 2023-06-18 DIAGNOSIS — Z8249 Family history of ischemic heart disease and other diseases of the circulatory system: Secondary | ICD-10-CM

## 2023-06-18 DIAGNOSIS — E669 Obesity, unspecified: Secondary | ICD-10-CM | POA: Diagnosis present

## 2023-06-18 DIAGNOSIS — Z96653 Presence of artificial knee joint, bilateral: Secondary | ICD-10-CM | POA: Diagnosis not present

## 2023-06-18 DIAGNOSIS — E876 Hypokalemia: Secondary | ICD-10-CM | POA: Diagnosis present

## 2023-06-18 DIAGNOSIS — K219 Gastro-esophageal reflux disease without esophagitis: Secondary | ICD-10-CM | POA: Diagnosis not present

## 2023-06-18 DIAGNOSIS — K5750 Diverticulosis of both small and large intestine without perforation or abscess without bleeding: Secondary | ICD-10-CM | POA: Diagnosis not present

## 2023-06-18 DIAGNOSIS — E785 Hyperlipidemia, unspecified: Secondary | ICD-10-CM

## 2023-06-18 DIAGNOSIS — E7800 Pure hypercholesterolemia, unspecified: Secondary | ICD-10-CM | POA: Diagnosis present

## 2023-06-18 DIAGNOSIS — Z7960 Long term (current) use of unspecified immunomodulators and immunosuppressants: Secondary | ICD-10-CM | POA: Diagnosis not present

## 2023-06-18 DIAGNOSIS — K5090 Crohn's disease, unspecified, without complications: Secondary | ICD-10-CM | POA: Diagnosis not present

## 2023-06-18 DIAGNOSIS — N200 Calculus of kidney: Secondary | ICD-10-CM | POA: Diagnosis not present

## 2023-06-18 DIAGNOSIS — Z888 Allergy status to other drugs, medicaments and biological substances status: Secondary | ICD-10-CM

## 2023-06-18 LAB — CBC WITH DIFFERENTIAL/PLATELET
Abs Immature Granulocytes: 0.04 10*3/uL (ref 0.00–0.07)
Basophils Absolute: 0 10*3/uL (ref 0.0–0.1)
Basophils Relative: 0 %
Eosinophils Absolute: 0 10*3/uL (ref 0.0–0.5)
Eosinophils Relative: 0 %
HCT: 41.4 % (ref 36.0–46.0)
Hemoglobin: 13 g/dL (ref 12.0–15.0)
Immature Granulocytes: 0 %
Lymphocytes Relative: 5 %
Lymphs Abs: 0.5 10*3/uL — ABNORMAL LOW (ref 0.7–4.0)
MCH: 24.7 pg — ABNORMAL LOW (ref 26.0–34.0)
MCHC: 31.4 g/dL (ref 30.0–36.0)
MCV: 78.7 fL — ABNORMAL LOW (ref 80.0–100.0)
Monocytes Absolute: 0.6 10*3/uL (ref 0.1–1.0)
Monocytes Relative: 6 %
Neutro Abs: 8.3 10*3/uL — ABNORMAL HIGH (ref 1.7–7.7)
Neutrophils Relative %: 89 %
Platelets: 151 10*3/uL (ref 150–400)
RBC: 5.26 MIL/uL — ABNORMAL HIGH (ref 3.87–5.11)
RDW: 14.9 % (ref 11.5–15.5)
WBC: 9.4 10*3/uL (ref 4.0–10.5)
nRBC: 0 % (ref 0.0–0.2)

## 2023-06-18 LAB — BASIC METABOLIC PANEL
Anion gap: 8 (ref 5–15)
BUN: 17 mg/dL (ref 8–23)
CO2: 24 mmol/L (ref 22–32)
Calcium: 8.9 mg/dL (ref 8.9–10.3)
Chloride: 106 mmol/L (ref 98–111)
Creatinine, Ser: 0.51 mg/dL (ref 0.44–1.00)
GFR, Estimated: >60 mL/min (ref >60)
Glucose, Bld: 113 mg/dL — ABNORMAL HIGH (ref 70–99)
Potassium: 4.3 mmol/L (ref 3.5–5.1)
Sodium: 138 mmol/L (ref 135–145)

## 2023-06-18 LAB — COMPREHENSIVE METABOLIC PANEL
ALT: 13 U/L (ref 0–44)
AST: 27 U/L (ref 15–41)
Albumin: 4 g/dL (ref 3.5–5.0)
Alkaline Phosphatase: 68 U/L (ref 38–126)
Anion gap: 9 (ref 5–15)
BUN: 16 mg/dL (ref 8–23)
CO2: 25 mmol/L (ref 22–32)
Calcium: 9.5 mg/dL (ref 8.9–10.3)
Chloride: 105 mmol/L (ref 98–111)
Creatinine, Ser: 0.49 mg/dL (ref 0.44–1.00)
GFR, Estimated: >60 mL/min (ref >60)
Glucose, Bld: 105 mg/dL — ABNORMAL HIGH (ref 70–99)
Potassium: 3.9 mmol/L (ref 3.5–5.1)
Sodium: 139 mmol/L (ref 135–145)
Total Bilirubin: 0.8 mg/dL (ref 0.3–1.2)
Total Protein: 6.7 g/dL (ref 6.5–8.1)

## 2023-06-18 LAB — LIPASE, BLOOD: Lipase: 1608 U/L — ABNORMAL HIGH (ref 11–51)

## 2023-06-18 LAB — CK: Total CK: 27 U/L — ABNORMAL LOW (ref 38–234)

## 2023-06-18 LAB — TRIGLYCERIDES: Triglycerides: 113 mg/dL (ref <150)

## 2023-06-18 LAB — MAGNESIUM: Magnesium: 2 mg/dL (ref 1.7–2.4)

## 2023-06-18 LAB — PHOSPHORUS: Phosphorus: 3.7 mg/dL (ref 2.5–4.6)

## 2023-06-18 LAB — TSH: TSH: 2.539 u[IU]/mL (ref 0.350–4.500)

## 2023-06-18 MED ORDER — PIPERACILLIN-TAZOBACTAM 3.375 G IVPB
3.3750 g | Freq: Three times a day (TID) | INTRAVENOUS | Status: DC
  Administered 2023-06-18 – 2023-06-20 (×5): 3.375 g via INTRAVENOUS
  Filled 2023-06-18 (×5): qty 50

## 2023-06-18 MED ORDER — ACETAMINOPHEN 325 MG PO TABS: 650.0000 mg | ORAL_TABLET | Freq: Four times a day (QID) | ORAL | Status: DC | PRN

## 2023-06-18 MED ORDER — SIMVASTATIN 20 MG PO TABS
40.0000 mg | ORAL_TABLET | Freq: Every evening | ORAL | Status: DC
  Administered 2023-06-18 – 2023-06-19 (×2): 40 mg via ORAL
  Filled 2023-06-18 (×2): qty 2

## 2023-06-18 MED ORDER — ACETAMINOPHEN 650 MG RE SUPP: 650.0000 mg | Freq: Four times a day (QID) | RECTAL | Status: DC | PRN

## 2023-06-18 MED ORDER — HYDROCODONE-ACETAMINOPHEN 5-325 MG PO TABS
1.0000 | ORAL_TABLET | ORAL | Status: DC | PRN
  Filled 2023-06-18: qty 1

## 2023-06-18 MED ORDER — SODIUM CHLORIDE 0.9 % IV BOLUS
1000.0000 mL | Freq: Once | INTRAVENOUS | Status: AC
  Administered 2023-06-18: 1000 mL via INTRAVENOUS

## 2023-06-18 MED ORDER — METOPROLOL SUCCINATE ER 50 MG PO TB24
100.0000 mg | ORAL_TABLET | Freq: Every day | ORAL | Status: DC
  Administered 2023-06-19 – 2023-06-21 (×3): 100 mg via ORAL
  Filled 2023-06-18 (×3): qty 2

## 2023-06-18 MED ORDER — KETOROLAC TROMETHAMINE 15 MG/ML IJ SOLN
15.0000 mg | Freq: Once | INTRAMUSCULAR | Status: AC
  Administered 2023-06-18: 15 mg via INTRAVENOUS
  Filled 2023-06-18: qty 1

## 2023-06-18 MED ORDER — HYDROMORPHONE HCL 1 MG/ML IJ SOLN
0.5000 mg | INTRAMUSCULAR | Status: DC | PRN
  Administered 2023-06-18: 0.5 mg via INTRAVENOUS
  Filled 2023-06-18: qty 0.5

## 2023-06-18 MED ORDER — SODIUM CHLORIDE 0.9 % IV SOLN: INTRAVENOUS | Status: AC

## 2023-06-18 MED ORDER — ONDANSETRON HCL 4 MG/2ML IJ SOLN: 4.0000 mg | Freq: Four times a day (QID) | INTRAMUSCULAR | Status: DC | PRN

## 2023-06-18 MED ORDER — ONDANSETRON HCL 4 MG PO TABS: 4.0000 mg | ORAL_TABLET | Freq: Four times a day (QID) | ORAL | Status: DC | PRN

## 2023-06-18 MED ORDER — HYDROMORPHONE HCL 1 MG/ML IJ SOLN
0.5000 mg | Freq: Once | INTRAMUSCULAR | Status: AC
  Administered 2023-06-18: 0.5 mg via INTRAVENOUS
  Filled 2023-06-18: qty 1

## 2023-06-18 MED ORDER — IOHEXOL 300 MG/ML  SOLN
100.0000 mL | Freq: Once | INTRAMUSCULAR | Status: AC | PRN
  Administered 2023-06-18: 100 mL via INTRAVENOUS

## 2023-06-18 MED ORDER — PIPERACILLIN-TAZOBACTAM 3.375 G IVPB 30 MIN
3.3750 g | Freq: Once | INTRAVENOUS | Status: AC
  Administered 2023-06-18: 3.375 g via INTRAVENOUS
  Filled 2023-06-18: qty 50

## 2023-06-18 MED ORDER — HYDROMORPHONE HCL 1 MG/ML IJ SOLN
1.0000 mg | Freq: Once | INTRAMUSCULAR | Status: AC
  Administered 2023-06-18: 1 mg via INTRAVENOUS
  Filled 2023-06-18: qty 1

## 2023-06-18 NOTE — Assessment & Plan Note (Signed)
Chronic stable cont to monitor no evidence of acute flare

## 2023-06-18 NOTE — Consult Note (Incomplete)
Referring Provider: EDP, Dr. Renaye Rakers Primary Care Physician:  [name], MD Primary Gastroenterologist:  Dr. Dr. Leone Payor  Reason for Consultation: Pancreatitis  HPI: Jade Hernandez is a 82 y.o. female with past medical history of Crohn's disease on Entyvio, hyperlipidemia, hypertension, and arthritis who presented to [facility] emergency department [facility] this morning with sudden onset abdominal pain.  Lipase found to be elevated at 1608.  LFTs and CBC normal.  CT scan abdomen and pelvis with contrast performed and showed the following:  IMPRESSION: 1. Findings consistent with acute pancreatitis. No evidence of pancreatic necrosis or acute peripancreatic collection. 2. Intra and extrahepatic biliary ductal dilatation with common bile duct measuring 17 mm. No calcified gallstone or visualized choledocholithiasis. This may be related to pancreatic inflammation or periampullary duodenal diverticulum. Recommend correlation with LFTs. Consider further evaluation with MRCP. 3. Wall thickening about the duodenum with adjacent fat stranding, felt to be secondary to pancreatic inflammation. Primary and duodenal inflammation is felt less likely. No other evidence of bowel inflammation. 4. Colonic diverticulosis without diverticulitis. 5. Nonobstructing right renal stone. 6. Simple appearing right ovarian cyst measuring 18 mm. This is unchanged from 2021 exam and most likely benign. No follow-up imaging is recommended per consensus guidelines.   Aortic Atherosclerosis (ICD10-I70.0).  In regards to her Crohn's disease she follows with Dr. Concha Se and is on [place].   Past Medical History:  Diagnosis Date   Adenomatous colon polyp    Arthritis    Crohn's disease of both small and large intestine with other complication (HCC) 10/23/2020   Diverticulosis    High cholesterol    Hypertension    IBS (irritable bowel syndrome)    Palpitations    Pre-diabetes    UTI  (urinary tract infection)     Past Surgical History:  Procedure Laterality Date   BILATERAL CARPAL TUNNEL RELEASE     COLONOSCOPY     NM MYOCAR PERF WALL MOTION  10/02/2007   No significant ischemia demonstrated   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    Prior to Admission medications   Medication Sig Start Date End Date Taking? Authorizing Provider  acetaminophen (TYLENOL) 500 MG tablet Take 1,000 mg by mouth in the morning and at bedtime.    [provider]  amLODipine (NORVASC) 2.5 MG tablet Take 1 tablet (2.5 mg total) by mouth daily. Patient not taking: Reported on 04/26/2023 02/17/23   Joylene Grapes, NP  metoprolol succinate (TOPROL-XL) 100 MG 24 hr tablet TAKE 1 TABLET(100 MG) BY MOUTH DAILY WITH OR IMMEDIATELY FOLLOWING A MEAL 05/16/23   Lennette Bihari, MD  Probiotic Product (PROBIOTIC DAILY PO) Take 1 capsule by mouth in the morning.    [provider]  simvastatin (ZOCOR) 40 MG tablet Take 40 mg by mouth every evening.    [provider]  sodium chloride 0.9 % SOLN 250 mL with vedolizumab 300 MG SOLR 300 mg Inject 300 mg into the vein every 8 (eight) weeks. 11/20/21   Iva Boop, MD  valsartan (  DIOVAN) 160 MG tablet Take 1 tablet (160 mg total) by mouth daily. 12/28/22   Lennette Bihari, MD    Current Facility-Administered Medications  Medication Dose Route Frequency Provider Last Rate Last Admin   piperacillin-tazobactam (ZOSYN) IVPB 3.375 g  3.375 g Intravenous Once  Trifan, Kermit Balo, MD       Followed by   piperacillin-tazobactam (ZOSYN) IVPB 3.375 g  3.375 g Intravenous Q8H Trifan, Kermit Balo, MD       sodium chloride 0.9 % bolus 1,000 mL  1,000 mL Intravenous Once Trifan, Kermit Balo, MD       Current Outpatient Medications  Medication Sig Dispense Refill   acetaminophen (TYLENOL) 500 MG tablet Take 1,000 mg by mouth in the morning and at bedtime.     amLODipine (NORVASC) 2.5 MG tablet Take 1 tablet (2.5 mg total) by mouth daily. (Patient not taking: Reported on 04/26/2023) 90 tablet 3   metoprolol succinate (TOPROL-XL) 100 MG 24 hr tablet TAKE 1 TABLET(100 MG) BY MOUTH DAILY WITH OR IMMEDIATELY FOLLOWING A MEAL 90 tablet 3   Probiotic Product (PROBIOTIC DAILY PO) Take 1 capsule by mouth in the morning.     simvastatin (ZOCOR) 40 MG tablet Take 40 mg by mouth every evening.     sodium chloride 0.9 % SOLN 250 mL with vedolizumab 300 MG SOLR 300 mg Inject 300 mg into the vein every 8 (eight) weeks.     valsartan (DIOVAN) 160 MG tablet Take 1 tablet (160 mg total) by mouth daily. 90 tablet 3    Allergies as of 06/18/2023 - Review Complete 06/18/2023  Allergen Reaction Noted   Feraheme [ferumoxytol] Shortness Of Breath and Palpitations 08/19/2020    Family History  Problem Relation Age of Onset   Hypertension Mother    Diabetes Mother    Heart disease Father        died from heart attack   Heart disease Brother    Colon cancer Neg Hx    Rectal cancer Neg Hx    Liver cancer Neg Hx    Stomach cancer Neg Hx     Social History   Socioeconomic History   Marital status: Married    Spouse name: Not on file   Number of children: 4   Years of education: Not on file   Highest education level: Not on file  Occupational History   Occupation: retired  Tobacco Use   Smoking status: Never   Smokeless tobacco: Never  Vaping Use   Vaping status: Never Used  Substance and Sexual Activity   Alcohol use: Yes    Alcohol/week: 1.0 standard drink of  alcohol    Types: 1 Glasses of wine per week    Comment: Rare   Drug use: No   Sexual activity: Not on file  Other Topics Concern   Not on file  Social History Narrative   The patient is married she is originally from [place]   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 [employer]   She does have children 2 daughters 2 sons multiple grandchildren   Rare alcohol no tobacco or drug use   Social Determinants of Corporate investment banker Strain: Not on file  Food Insecurity: No Food Insecurity (03/14/2023)   Hunger Vital Sign    Worried About Running Out of Food in the Last Year: Never true    Ran Out of Food in the Last Year: Never true  Transportation Needs: No Transportation Needs (03/14/2023)   PRAPARE - Transportation  Lack of Transportation (Medical): No    Lack of Transportation (Non-Medical): No  Physical Activity: Not on file  Stress: Not on file  Social Connections: Not on file  Intimate Partner Violence: Not At Risk (03/14/2023)   Humiliation, Afraid, Rape, and Kick questionnaire    Fear of Current or Ex-Partner: No    Emotionally Abused: No    Physically Abused: No    Sexually Abused: No    Review of Systems: ROS is O/W negative except as mentioned in HPI.  Physical Exam: Vital signs in last 24 hours: Temp:  [98 F (36.7 C)] 98 F (36.7 C) (08/03 1149) Pulse Rate:  [77-91] 91 (08/03 1300) Resp:  [16-19] 16 (08/03 1300) BP: (126-127)/(58-71) 127/71 (08/03 1300) SpO2:  [91 %-98 %] 91 % (08/03 1300) Weight:  [77.7 kg] 77.7 kg (08/03 1159)   General:   Alert,  Well-developed, well-nourished, pleasant and cooperative in NAD Head:  Normocephalic and atraumatic. Eyes:  Sclera clear, no icterus.   Conjunctiva pink. Ears:  Normal auditory acuity. Mouth:  No deformity or lesions.   Lungs:  Clear throughout to auscultation.   No wheezes, crackles, or rhonchi.  Heart:  Regular rate and rhythm; no murmurs, clicks, rubs,  or gallops. Abdomen:  Soft,nontender, BS active,nonpalp  mass or hsm.   Rectal:  Deferred  Msk:  Symmetrical without gross deformities. Pulses:  Normal pulses noted. Extremities:  Without clubbing or edema. Neurologic:  Alert and  oriented x4;  grossly normal neurologically. Skin:  Intact without significant lesions or rashes. Psych:  Alert and cooperative. Normal mood and affect.  Lab Results: Recent Labs    06/18/23 1351  WBC 9.4  HGB 13.0  HCT 41.4  PLT 151   BMET Recent Labs    06/18/23 1351  NA 139  K 3.9  CL 105  CO2 25  GLUCOSE 105*  BUN 16  CREATININE 0.49  CALCIUM 9.5   LFT Recent Labs    06/18/23 1351  PROT 6.7  ALBUMIN 4.0  AST 27  ALT 13  ALKPHOS 68  BILITOT 0.8   Studies/Results: CT ABDOMEN PELVIS W CONTRAST  Result Date: 06/18/2023 CLINICAL DATA:  Acute abdominal pain.  History of Crohn's disease. EXAM: CT ABDOMEN AND PELVIS WITH CONTRAST TECHNIQUE: Multidetector CT imaging of the abdomen and pelvis was performed using the standard protocol following bolus administration of intravenous contrast. RADIATION DOSE REDUCTION: This exam was performed according to the departmental dose-optimization program which includes automated exposure control, adjustment of the mA and/or kV according to patient size and/or use of iterative reconstruction technique. CONTRAST:  OMNIPAQUE IOHEXOL 300 MG/ML  SOLN COMPARISON:  CT 07/22/2020 FINDINGS: Lower chest: Subsegmental atelectasis within both lung bases. No significant pleural effusion. Aortic atherosclerosis. Hepatobiliary: Gallbladder is mildly distended. There is intra and extrahepatic biliary ductal dilatation. The common bile duct measures 17 mm at the porta hepatis. No calcified gallstone or visualized choledocholithiasis. No pericholecystic fat stranding. No focal hepatic lesion. Pancreas: Diffuse peripancreatic fat stranding and edema. No evidence of obstructing pancreatic mass. Borderline pancreatic ductal dilatation, 3-4 mm. Homogeneous pancreatic enhancement. No  pancreatic necrosis. No acute peripancreatic collection. Spleen: Normal in size without focal abnormality. The splenic vein is patent. Adrenals/Urinary Tract: No adrenal nodule. No hydronephrosis. 3 mm nonobstructing stone in the upper pole of the right kidney. Suspicious renal lesion. Unremarkable urinary bladder. Stomach/Bowel: Small hiatal hernia. The stomach is nondistended. There is a periampullary duodenal diverticulum. Mild wall thickening about the fourth portion of the duodenum. Mild  Peri duodenal fat stranding and edema. No other small bowel inflammation. No small bowel obstruction. Normal appendix. No terminal ileal inflammation. Scattered colonic diverticula. The majority of the colon is decompressed which limits assessment for wall thickening. There is no significant pericolonic edema or evidence of colonic inflammation. Vascular/Lymphatic: Aortic atherosclerosis and tortuosity. No aneurysm. The portal and splenic veins are patent. No enlarged lymph nodes in the abdomen or pelvis. Reproductive: Unchanged 18 mm simple appearing right ovarian cyst series 2, image 61. Anteverted uterus. No suspicious adnexal mass. Other: Retroperitoneal fat stranding and edema with trace free fluid. No free air or focal fluid collection. No abdominal wall hernia. Musculoskeletal: Degenerative change throughout the spine. There are no acute or suspicious osseous abnormalities. IMPRESSION: 1. Findings consistent with acute pancreatitis. No evidence of pancreatic necrosis or acute peripancreatic collection. 2. Intra and extrahepatic biliary ductal dilatation with common bile duct measuring 17 mm. No calcified gallstone or visualized choledocholithiasis. This may be related to pancreatic inflammation or periampullary duodenal diverticulum. Recommend correlation with LFTs. Consider further evaluation with MRCP. 3. Wall thickening about the duodenum with adjacent fat stranding, felt to be secondary to pancreatic inflammation.  Primary and duodenal inflammation is felt less likely. No other evidence of bowel inflammation. 4. Colonic diverticulosis without diverticulitis. 5. Nonobstructing right renal stone. 6. Simple appearing right ovarian cyst measuring 18 mm. This is unchanged from 2021 exam and most likely benign. No follow-up imaging is recommended per consensus guidelines. Aortic Atherosclerosis (ICD10-I70.0). Electronically Signed   By: Narda Rutherford M.D.   On: 06/18/2023 15:09    IMPRESSION:  82 year old female with sudden onset epigastric abdominal pain that woke her from sleep this morning.  Lipase of 1600 and LFTs normal.  CT scan abdomen and pelvis with contrast shows acute pancreatitis with intra and extrahepatic biliary ductal dilatation common bile duct measuring 17 mm.  No choledocholithiasis noted.  They specifically stated that the biliary ductal dilatation may be related to pancreatic inflammation.  There is associated wall thickening of the duodenum with adjacent fat stranding felt to be secondary to pancreatic inflammation as well.  Triglyceride levels are pending.  There is a risk of pancreatitis with Entyvio. *Crohn's disease, follows with Dr. Leone Payor on [place].  PLAN:    Jade Hernandez.   06/18/2023, 3:44 PM

## 2023-06-18 NOTE — ED Triage Notes (Signed)
Patient arrives with complaints of sudden onset of abdominal pain today. She was referred here by urgent care for further workup.  Rates pain a 7/10. No vomiting, did have increased bowel movements today.

## 2023-06-18 NOTE — H&P (Addendum)
Jade Hernandez ZOX:[id number] DOB: November 18, 1940 DOA: 06/18/2023     PCP: [name], MD   Outpatient Specialists:  CARDS:   Dr. Nicki Guadalajara, MD   GI  Dr.  Leone Payor (  [facility])    Patient arrived to ER on 06/18/23 at 1138 Referred by Attending Therisa Doyne, MD   Patient coming from:    home Lives With family    Chief Complaint:   Chief Complaint  Patient presents with   Abdominal Pain    HPI: Jade Hernandez  is a 82 y.o. female with medical history significant of HTN, HLD, GERD, CHF grade 1    Presented with   abdominal pain  Patient with known history of Crohn's disease Came from home with abdominal pain she first went to urgent care and was deferred to go to emergency department secondary to severe pain no associated vomiting Pain started since this morning diffuse across her abdomen No associated nausea vomiting no fevers or chills she have had a bit more extra bowel movements though No Prior history of pancreatitis   She had some figs, no recent fatty foods  She still has her gall blader Denies significant ETOH intake   Does not smoke   No results found for: "SARSCOV2NAA"      Regarding pertinent Chronic problems:     Hyperlipidemia - on statins Zocor Lipid Panel      Crohn's disease Long-term use of immunosuppressant medication _ Entyvio   infusions every 8 wks  HTN on Toprol, norvasc   chronic CHF diastolic  - last echo  Recent Results (from the past 81191 hour(s))  ECHOCARDIOGRAM COMPLETE   Collection Time: 02/07/23  4:52 PM  Result Value   Area-P 1/2 7.59   S' Lateral 2.60   P 1/2 time 391   Est EF 60 - 65%   Narrative      ECHOCARDIOGRAM REPORT      IMPRESSIONS    1. Left ventricular ejection fraction, by estimation, is 60 to 65%. The left ventricle has normal function. The left ventricle has no regional wall motion abnormalities. There is mild left ventricular hypertrophy. Left ventricular diastolic parameters  were normal.  2.  Right ventricular systolic function is normal. The right ventricular size is normal. Tricuspid regurgitation signal is inadequate for assessing PA pressure.  3. Left atrial size was mildly dilated.  4. The mitral valve is normal in structure. Trivial mitral valve regurgitation. No evidence of mitral stenosis.  5. The aortic valve is tricuspid. Aortic valve regurgitation is mild. No aortic stenosis is present.  6. Aortic dilatation noted. There is dilatation of the ascending aorta, measuring 41 mm.  7. The inferior vena cava is normal in size with greater than 50% respiratory variability, suggesting right atrial pressure of 3 mmHg.            Chronic anemia - baseline hg Hemoglobin & Hematocrit  Recent Labs    03/15/23 0343 03/16/23 0354 06/18/23 1351  HGB 10.0* 9.4* 13.0   Iron/TIBC/Ferritin/ %Sat    Component Value Date/Time   IRON 15 (L) 10/01/2020 1302   IRON <10 (L) 10/01/2020 1302   IRON 13 (L) 07/24/2020 1612   TIBC 252 10/01/2020 1302   TIBC 239 (L) 07/24/2020 1612   FERRITIN 18.9 01/06/2021 1444   FERRITIN 279 (H) 07/24/2020 1612   IRONPCTSAT 5.4 (L) 10/01/2020 1302   IRONPCTSAT 2 (L) 10/01/2020 1302     While in ER: Clinical Course as of 06/18/23 2106  Sat Jun 18, 2023  1449 Lipase(!): 1,608 [MT]  1537 [facility] team consultant reports no indication for MRI at this time - treat for acute pancreatitis, check triglycerides - no need for emergent GI consultation unless pt's labs worsening or new concerns arrive.  Signout given to hospitalist dr Lyn Hollingshead [MT]    Clinical Course User Index [MT] Terald Sleeper, MD       Lab Orders         Comprehensive metabolic panel         CBC with Differential         Lipase, blood         Triglycerides        CTabd/pelvis - CT abdomen/pelvis with contrast consistent with acute pancreatitis, no pancreatic necrosis/abscess, intra and extrahepatic biliary ductal dilatation but no obvious gallstone disease, sequelae of  pancreatic inflammation with wall thickening around duodenum and this also may be contributing to biliary ductal dilatation.     Following Medications were ordered in ER: Medications  piperacillin-tazobactam (ZOSYN) IVPB 3.375 g (0 g Intravenous Stopped 06/18/23 1637)    Followed by  piperacillin-tazobactam (ZOSYN) IVPB 3.375 g (has no administration in time range)  HYDROmorphone (DILAUDID) injection 0.5 mg (0.5 mg Intravenous Given 06/18/23 1345)  ketorolac (TORADOL) 15 MG/ML injection 15 mg (15 mg Intravenous Given 06/18/23 1342)  iohexol (OMNIPAQUE) 300 MG/ML solution 100 mL (100 mLs Intravenous Contrast Given 06/18/23 1448)  sodium chloride 0.9 % bolus 1,000 mL ( Intravenous Stopped 06/18/23 1710)  HYDROmorphone (DILAUDID) injection 1 mg (1 mg Intravenous Given 06/18/23 1617)    _______________________________________________________ ER Provider Called:   [facility] They Recommend admit to medicine    treat for acute pancreatitis, check triglycerides - no need for emergent GI consultation unless pt's labs worsening or new concerns arrive   ED Triage Vitals  Encounter Vitals Group     BP 06/18/23 1149 (!) 126/58     Systolic BP Percentile --      Diastolic BP Percentile --      Pulse Rate 06/18/23 1149 77     Resp 06/18/23 1149 19     Temp 06/18/23 1149 98 F (36.7 C)     Temp Source 06/18/23 1608 Oral     SpO2 06/18/23 1149 98 %     Weight 06/18/23 1159 171 lb 4.8 oz (77.7 kg)     Height 06/18/23 1159 5\' 3"  (1.6 m)     Head Circumference --      Peak Flow --      Pain Score 06/18/23 1300 4     Pain Loc --      Pain Education --      Exclude from Growth Chart --   XBJY(78)@     _________________________________________ Significant initial  Findings: Abnormal Labs Reviewed  COMPREHENSIVE METABOLIC PANEL - Abnormal; Notable for the following components:      Result Value   Glucose, Bld 105 (*)    All other components within normal limits  CBC WITH DIFFERENTIAL/PLATELET - Abnormal;  Notable for the following components:   RBC 5.26 (*)    MCV 78.7 (*)    MCH 24.7 (*)    Neutro Abs 8.3 (*)    Lymphs Abs 0.5 (*)    All other components within normal limits  LIPASE, BLOOD - Abnormal; Notable for the following components:   Lipase 1,608 (*)    All other components within normal limits     ECG: Ordered Personally reviewed and  interpreted by me showing: HR : 86 Rhythm:Normal sinus rhythm Minimal voltage criteria for LVH, may be normal variant ( R in aVL ) Nonspecific ST and T wave abnormality QTC 418      Vitals:   06/18/23 1300 06/18/23 1608 06/18/23 1700 06/18/23 2041  BP: 127/71  125/64 (!) 135/57  Pulse: 91  88 90  Resp: 16  16 18   Temp:  98.3 F (36.8 C)  98.7 F (37.1 C)  TempSrc:  Oral  Oral  SpO2: 91%  97% 99%  Weight:      Height:        WBC     Component Value Date/Time   WBC 9.4 06/18/2023 1351   LYMPHSABS 0.5 (L) 06/18/2023 1351   MONOABS 0.6 06/18/2023 1351   EOSABS 0.0 06/18/2023 1351   BASOSABS 0.0 06/18/2023 1351       UA  not ordered     Results for orders placed or performed during the hospital encounter of 03/02/23  Surgical pcr screen     Status: None   Collection Time: 03/02/23  3:11 PM   Specimen: Nasal Mucosa; Nasal Swab  Result Value Ref Range Status   MRSA, PCR NEGATIVE NEGATIVE Final   Staphylococcus aureus NEGATIVE NEGATIVE Final    Comment: (NOTE) The Xpert SA Assay (FDA approved for NASAL specimens in patients 68 years of age and older), is one component of a comprehensive surveillance program. It is not intended to diagnose infection nor to guide or monitor treatment. Performed at [facility], [address] [place], [place] [address]     ABX started Antibiotics Given (last 72 hours)     Date/Time Action Medication Dose Rate   06/18/23 1608 New Bag/Given   piperacillin-tazobactam (ZOSYN) IVPB 3.375 g 3.375 g 100 mL/hr        __________________________________________________________ Recent Labs  Lab 06/18/23 1351  NA 139  K 3.9  CO2 25  GLUCOSE 105*  BUN 16  CREATININE 0.49  CALCIUM 9.5    Cr   stable,   Lab Results  Component Value Date   CREATININE 0.49 06/18/2023   CREATININE 0.49 03/15/2023   CREATININE 0.46 03/02/2023    Recent Labs  Lab 06/18/23 1351  AST 27  ALT 13  ALKPHOS 68  BILITOT 0.8  PROT 6.7  ALBUMIN 4.0   Lab Results  Component Value Date   CALCIUM 9.5 06/18/2023    Plt: Lab Results  Component Value Date   PLT 151 06/18/2023        Recent Labs  Lab 06/18/23 1351  WBC 9.4  NEUTROABS 8.3*  HGB 13.0  HCT 41.4  MCV 78.7*  PLT 151    HG/HCT stable,       Component Value Date/Time   HGB 13.0 06/18/2023 1351   HCT 41.4 06/18/2023 1351   MCV 78.7 (L) 06/18/2023 1351     Recent Labs  Lab 06/18/23 1351  LIPASE 1,608*   No results for input(s): "AMMONIA" in the last 168 hours.    _______________________________________________ Hospitalist was called for admission for   Acute pancreatitis,   Duodenitis associated with pancreatitis     The following Work up has been ordered so far:  Orders Placed This Encounter  Procedures   CT ABDOMEN PELVIS W CONTRAST   Comprehensive metabolic panel   CBC with Differential   Lipase, blood   Triglycerides   Diet clear liquid Room service appropriate? Yes; Fluid consistency: Thin   Consult to gastroenterology  Pancreatitis   Consult to hospitalist   EKG 12-Lead   Admit to Inpatient (patient's expected length of stay will be greater than 2 midnights or inpatient only procedure)     OTHER Significant initial  Findings:  labs showing:     DM  labs:  HbA1C: Recent Labs    03/02/23 1448  HGBA1C 5.3       CBG (last 3)  No results for input(s): "GLUCAP" in the last 72 hours.        Cultures: No results found for: "SDES", "SPECREQUEST", "CULT", "REPTSTATUS"   Radiological Exams on Admission: CT  ABDOMEN PELVIS W CONTRAST  Result Date: 06/18/2023 CLINICAL DATA:  Acute abdominal pain.  History of Crohn's disease. EXAM: CT ABDOMEN AND PELVIS WITH CONTRAST TECHNIQUE: Multidetector CT imaging of the abdomen and pelvis was performed using the standard protocol following bolus administration of intravenous contrast. RADIATION DOSE REDUCTION: This exam was performed according to the departmental dose-optimization program which includes automated exposure control, adjustment of the mA and/or kV according to patient size and/or use of iterative reconstruction technique. CONTRAST:  OMNIPAQUE IOHEXOL 300 MG/ML  SOLN COMPARISON:  CT 07/22/2020 FINDINGS: Lower chest: Subsegmental atelectasis within both lung bases. No significant pleural effusion. Aortic atherosclerosis. Hepatobiliary: Gallbladder is mildly distended. There is intra and extrahepatic biliary ductal dilatation. The common bile duct measures 17 mm at the porta hepatis. No calcified gallstone or visualized choledocholithiasis. No pericholecystic fat stranding. No focal hepatic lesion. Pancreas: Diffuse peripancreatic fat stranding and edema. No evidence of obstructing pancreatic mass. Borderline pancreatic ductal dilatation, 3-4 mm. Homogeneous pancreatic enhancement. No pancreatic necrosis. No acute peripancreatic collection. Spleen: Normal in size without focal abnormality. The splenic vein is patent. Adrenals/Urinary Tract: No adrenal nodule. No hydronephrosis. 3 mm nonobstructing stone in the upper pole of the right kidney. Suspicious renal lesion. Unremarkable urinary bladder. Stomach/Bowel: Small hiatal hernia. The stomach is nondistended. There is a periampullary duodenal diverticulum. Mild wall thickening about the fourth portion of the duodenum. Mild Peri duodenal fat stranding and edema. No other small bowel inflammation. No small bowel obstruction. Normal appendix. No terminal ileal inflammation. Scattered colonic diverticula. The majority  of the colon is decompressed which limits assessment for wall thickening. There is no significant pericolonic edema or evidence of colonic inflammation. Vascular/Lymphatic: Aortic atherosclerosis and tortuosity. No aneurysm. The portal and splenic veins are patent. No enlarged lymph nodes in the abdomen or pelvis. Reproductive: Unchanged 18 mm simple appearing right ovarian cyst series 2, image 61. Anteverted uterus. No suspicious adnexal mass. Other: Retroperitoneal fat stranding and edema with trace free fluid. No free air or focal fluid collection. No abdominal wall hernia. Musculoskeletal: Degenerative change throughout the spine. There are no acute or suspicious osseous abnormalities. IMPRESSION: 1. Findings consistent with acute pancreatitis. No evidence of pancreatic necrosis or acute peripancreatic collection. 2. Intra and extrahepatic biliary ductal dilatation with common bile duct measuring 17 mm. No calcified gallstone or visualized choledocholithiasis. This may be related to pancreatic inflammation or periampullary duodenal diverticulum. Recommend correlation with LFTs. Consider further evaluation with MRCP. 3. Wall thickening about the duodenum with adjacent fat stranding, felt to be secondary to pancreatic inflammation. Primary and duodenal inflammation is felt less likely. No other evidence of bowel inflammation. 4. Colonic diverticulosis without diverticulitis. 5. Nonobstructing right renal stone. 6. Simple appearing right ovarian cyst measuring 18 mm. This is unchanged from 2021 exam and most likely benign. No follow-up imaging is recommended per consensus guidelines. Aortic Atherosclerosis (ICD10-I70.0). Electronically Signed  By: Narda Rutherford M.D.   On: 06/18/2023 15:09   _______________________________________________________________________________________________________ Latest  Blood pressure (!) 135/57, pulse 90, temperature 98.7 F (37.1 C), temperature source Oral, resp. rate 18,  height 5\' 3"  (1.6 m), weight 77.7 kg, SpO2 99%.   Vitals  labs and radiology finding personally reviewed  Review of Systems:    Pertinent positives include:  abdominal pain, nausea,  Constitutional:  No weight loss, night sweats, Fevers, chills, fatigue, weight loss  HEENT:  No headaches, Difficulty swallowing,Tooth/dental problems,Sore throat,  No sneezing, itching, ear ache, nasal congestion, post nasal drip,  Cardio-vascular:  No chest pain, Orthopnea, PND, anasarca, dizziness, palpitations.no Bilateral lower extremity swelling  GI:  No heartburn, indigestion,  vomiting, diarrhea, change in bowel habits, loss of appetite, melena, blood in stool, hematemesis Resp:  no shortness of breath at rest. No dyspnea on exertion, No excess mucus, no productive cough, No non-productive cough, No coughing up of blood.No change in color of mucus.No wheezing. Skin:  no rash or lesions. No jaundice GU:  no dysuria, change in color of urine, no urgency or frequency. No straining to urinate.  No flank pain.  Musculoskeletal:  No joint pain or no joint swelling. No decreased range of motion. No back pain.  Psych:  No change in mood or affect. No depression or anxiety. No memory loss.  Neuro: no localizing neurological complaints, no tingling, no weakness, no double vision, no gait abnormality, no slurred speech, no confusion  All systems reviewed and apart from HOPI all are negative _______________________________________________________________________________________________ Past Medical History:   Past Medical History:  Diagnosis Date   Adenomatous colon polyp    Arthritis    Crohn's disease of both small and large intestine with other complication (HCC) 10/23/2020   Diverticulosis    High cholesterol    Hypertension    IBS (irritable bowel syndrome)    Palpitations    Pre-diabetes    UTI (urinary tract infection)      Past Surgical History:  Procedure Laterality Date   BILATERAL  CARPAL TUNNEL RELEASE     COLONOSCOPY     NM MYOCAR PERF WALL MOTION  10/02/2007   No significant ischemia demonstrated   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    Social History:  Ambulatory   independently     reports that she has never smoked. She has never used smokeless tobacco. She reports current alcohol use of about 1.0 standard drink of alcohol per week. She reports that she does not use drugs.    Family History:   Family History  Problem Relation Age of Onset   Hypertension Mother    Diabetes Mother    Heart disease Father        died from heart attack   Heart disease Brother    Colon cancer Neg Hx    Rectal cancer Neg Hx    Liver cancer Neg Hx    Stomach cancer Neg Hx    ______________________________________________________________________________________________ Allergies: Allergies  Allergen Reactions   Feraheme [Ferumoxytol] Shortness Of Breath and  Palpitations    Severe pain all over     Prior to Admission medications   Medication Sig Start Date End Date Taking? Authorizing Provider  acetaminophen (TYLENOL) 500 MG tablet Take 1,000 mg by mouth in the morning and at bedtime.    [provider]  amLODipine (NORVASC) 2.5 MG tablet Take 1 tablet (2.5 mg total) by mouth daily. Patient not taking: Reported on 04/26/2023 02/17/23   Joylene Grapes, NP  metoprolol succinate (TOPROL-XL) 100 MG 24 hr tablet TAKE 1 TABLET(100 MG) BY MOUTH DAILY WITH OR IMMEDIATELY FOLLOWING A MEAL  05/16/23   Lennette Bihari, MD  Probiotic Product (PROBIOTIC DAILY PO) Take 1 capsule by mouth in the morning.    [provider]  simvastatin (ZOCOR) 40 MG tablet Take 40 mg by mouth every evening.    [provider]  sodium chloride 0.9 % SOLN 250 mL with vedolizumab 300 MG SOLR 300 mg Inject 300 mg into the vein every 8 (eight) weeks. 11/20/21   Iva Boop, MD  valsartan (DIOVAN) 160 MG tablet Take 1 tablet (160 mg total) by mouth daily. 12/28/22   Lennette Bihari, MD    ___________________________________________________________________________________________________ Physical Exam:    06/18/2023    8:41 PM 06/18/2023    5:00 PM 06/18/2023    1:00 PM  Vitals with BMI  Systolic 135 125 161  Diastolic 57 64 71  Pulse 90 88 91     1. General:  in No  Acute distress  well -appearing 2. Psychological: Alert and   Oriented 3. Head/ENT:    Dry Mucous Membranes                          Head Non traumatic, neck supple                          Normal   Dentition 4. SKIN:  decreased Skin turgor,  Skin clean Dry and intact no rash    5. Heart: Regular rate and rhythm no  Murmur, no Rub or gallop 6. Lungs:  Clear to auscultation bilaterally, no wheezes or crackles   7. Abdomen: Soft,  epigastric tender, suprapubic  Non distended  bowel sounds present 8. Lower extremities: no clubbing, cyanosis, no  edema 9. Neurologically Grossly intact, moving all 4 extremities equally   10. MSK: Normal range of motion    Chart has been reviewed  ______________________________________________________________________________________________  Assessment/Plan 82 y.o. female with medical history significant of HTN, HLD, GERD, CHF grade 1  Admitted for   Acute pancreatitis,  Duodenitis     Present on Admission:  Pancreatitis  Hypertension  Hyperlipidemia  GERD (gastroesophageal reflux disease)  Crohn's disease of both small and large intestine with other complication (HCC)   Duodenitis     Pancreatitis  Cause unclear  CT of abdomen showing acute pancreatitis  no evidence of pseudocyst Lipase     Component Value Date/Time   LIPASE 1,608 (H) 06/18/2023 1351   Lipid Panel ordered  Will rehydrate with aggressive IV fluids Keep n.p.o. Follow clinically   -Check lipid panel -  there have been case reports of Entyvio induced pancreatitis Rec discuss with Dr. Leone Payor when pt sees him next Control pain with IV pain medications If no significant improvement in the next 24 hours may benefit from GI consult     Hyperlipidemia Cont Zocor 40 mg when pt able to tolerate  Hypertension  Hold NOrvasc given risk of Pancreatitis Allow permissive HTN for tonight  Crohn's disease of both small and large intestine with other complication (HCC) Chronic stable cont to monitor no evidence of acute flare  Duodenitis More likely reactive in the setting of pancreatitis we will continue to monitor Patient does not endorse any fevers or chills Biliary tract dilatation also thought to be secondary to pancreatitis No white blood cell count elevation no elevated LFTs Pt was started on Zosyn  If stable would deescalate in AM   Other plan as per orders.  DVT prophylaxis:  SCD     Code Status:    Code Status: Prior FULL CODE  as per patient   I had personally discussed CODE STATUS with patient and family  ACP none   Family Communication:   Family not at  Bedside  plan of care was discussed on the phone with   Daughter,   Diet  Diet Orders (From admission, onward)     Start     Ordered   06/18/23 1525  Diet clear liquid Room service appropriate? Yes; Fluid consistency: Thin  Diet effective now       Question Answer Comment  Room service appropriate? Yes   Fluid consistency: Thin      06/18/23 1524            Disposition Plan:    To home once workup is complete and patient is stable   Following barriers for discharge:                                                           Pain controlled with PO medications                                     Consult Orders  (From admission, onward)           Start     Ordered   06/18/23 1525  Consult to hospitalist  [place] with cl called for consult  Once       Provider:  (Not yet assigned)  Question Answer Comment  Place call to: [facility]   Reason for Consult Admit      06/18/23 1524                               Consults called:  GI is aware, re consult as needed   Treatment Team:  Hilarie Fredrickson, MD  Admission status:  ED Disposition     ED Disposition  Admit   Condition  --   Comment  Hospital Area: [facility] [[id number]]  Level of Care: Med-Surg [16]  May admit patient to [facility] or [facility] if equivalent level of care is available:: Yes  Interfacility transfer: Yes  Covid Evaluation: Asymptomatic - no recent exposure (last 10 days) testing not required  Diagnosis: Pancreatitis [409811]  Admitting Physician: Sunnie Nielsen [[id number]]  Attending Physician: Sunnie Nielsen [phone]  Certification:: I certify this patient will need inpatient services for at least 2 midnights  Estimated Length of Stay: 2             inpatient  I Expect 2 midnight stay secondary to severity of patient's current illness need for inpatient interventions justified by the following:    Severe lab/radiological/exam abnormalities including:    pancreatitis    That are currently affecting medical management.   I expect  patient to be hospitalized for 2 midnights requiring inpatient medical care.  Patient is at high risk for adverse outcome (such as loss of life or disability) if not treated.  Indication for inpatient stay as follows:    severe pain requiring acute inpatient management,     Need for  IV fluids  IV pain medications,     Level of care    medical floor       Therisa Doyne 06/18/2023, 10:54 PM    [facility]      after 2 AM please page floor coverage PA If 7AM-7PM, please contact the day team taking care of the patient using [email or web address]

## 2023-06-18 NOTE — Assessment & Plan Note (Signed)
Cont Zocor 40 mg when pt able to tolerate

## 2023-06-18 NOTE — Subjective & Objective (Addendum)
Patient with known history of Crohn's disease Came from home with abdominal pain she first went to urgent care and was deferred to go to emergency department secondary to severe pain no associated vomiting Pain started since this morning diffuse across her abdomen No associated nausea vomiting no fevers or chills she have had a bit more extra bowel movements though No Prior history of pancreatitis

## 2023-06-18 NOTE — ED Provider Notes (Addendum)
[place] EMERGENCY DEPARTMENT AT [facility] Provider Note   CSN: [id number] Arrival date & time: 06/18/23  1138     History  Chief Complaint  Patient presents with   Abdominal Pain    Jade Hernandez is a 82 y.o. female presented to ED with epigastric and abdominal pain that began and developed earlier today.  Patient reports she has had severe pain since this morning.  The pain is diffuse across her abdomen.  She reports a history of Crohn's disease and is not certain whether still similar to her Crohn's exacerbations.  She denies vomiting or nausea.  Denies fevers or chills.  Denies any history of abdominal surgery.  HPI     Home Medications Prior to Admission medications   Medication Sig Start Date End Date Taking? Authorizing Provider  acetaminophen (TYLENOL) 500 MG tablet Take 1,000 mg by mouth in the morning and at bedtime.    [provider]  amLODipine (NORVASC) 2.5 MG tablet Take 1 tablet (2.5 mg total) by mouth daily. Patient not taking: Reported on 04/26/2023 02/17/23   Joylene Grapes, NP  metoprolol succinate (TOPROL-XL) 100 MG 24 hr tablet TAKE 1 TABLET(100 MG) BY MOUTH DAILY WITH OR IMMEDIATELY FOLLOWING A MEAL 05/16/23   Lennette Bihari, MD  Probiotic Product (PROBIOTIC DAILY PO) Take 1 capsule by mouth in the morning.    [provider]  simvastatin (ZOCOR) 40 MG tablet Take 40 mg by mouth every evening.    [provider]  sodium chloride 0.9 % SOLN 250 mL with vedolizumab 300 MG SOLR 300 mg Inject 300 mg into the vein every 8 (eight) weeks. 11/20/21   Iva Boop, MD  valsartan (DIOVAN) 160 MG tablet Take 1 tablet (160 mg total) by mouth daily. 12/28/22   Lennette Bihari, MD      Allergies    Feraheme [ferumoxytol]    Review of Systems   Review of Systems  Physical Exam Updated Vital Signs BP 127/71 (BP Location: Right Arm)   Pulse 91   Temp 98 F (36.7 C)   Resp 16   Ht 5\' 3"  (1.6 m)   Wt 77.7 kg   SpO2 91%   BMI  30.34 kg/m  Physical Exam Constitutional:      General: She is not in acute distress. HENT:     Head: Normocephalic and atraumatic.  Eyes:     Conjunctiva/sclera: Conjunctivae normal.     Pupils: Pupils are equal, round, and reactive to light.  Cardiovascular:     Rate and Rhythm: Normal rate and regular rhythm.  Pulmonary:     Effort: Pulmonary effort is normal. No respiratory distress.  Abdominal:     General: There is no distension.     Tenderness: There is generalized abdominal tenderness.  Skin:    General: Skin is warm and dry.  Neurological:     General: No focal deficit present.     Mental Status: She is alert. Mental status is at baseline.  Psychiatric:        Mood and Affect: Mood normal.        Behavior: Behavior normal.     ED Results / Procedures / Treatments   Labs (all labs ordered are listed, but only abnormal results are displayed) Labs Reviewed  COMPREHENSIVE METABOLIC PANEL - Abnormal; Notable for the following components:      Result Value   Glucose, Bld 105 (*)    All other components within normal limits  CBC WITH DIFFERENTIAL/PLATELET - Abnormal; Notable for the following components:   RBC 5.26 (*)    MCV 78.7 (*)    MCH 24.7 (*)    Neutro Abs 8.3 (*)    Lymphs Abs 0.5 (*)    All other components within normal limits  LIPASE, BLOOD - Abnormal; Notable for the following components:   Lipase 1,608 (*)    All other components within normal limits  TRIGLYCERIDES    EKG EKG Interpretation Date/Time:  Saturday June 18 2023 11:55:33 EDT Ventricular Rate:  86 PR Interval:  148 QRS Duration:  82 QT Interval:  350 QTC Calculation: 418 R Axis:   -17  Text Interpretation: Normal sinus rhythm Minimal voltage criteria for LVH, may be normal variant ( R in aVL ) Nonspecific ST and T wave abnormality Abnormal ECG When compared with ECG of 27-Apr-2022 12:14, PREVIOUS ECG IS PRESENT Confirmed by Alvester Chou [phone]) on 06/18/2023 12:43:06  PM  Radiology CT ABDOMEN PELVIS W CONTRAST  Result Date: 06/18/2023 CLINICAL DATA:  Acute abdominal pain.  History of Crohn's disease. EXAM: CT ABDOMEN AND PELVIS WITH CONTRAST TECHNIQUE: Multidetector CT imaging of the abdomen and pelvis was performed using the standard protocol following bolus administration of intravenous contrast. RADIATION DOSE REDUCTION: This exam was performed according to the departmental dose-optimization program which includes automated exposure control, adjustment of the mA and/or kV according to patient size and/or use of iterative reconstruction technique. CONTRAST:  OMNIPAQUE IOHEXOL 300 MG/ML  SOLN COMPARISON:  CT 07/22/2020 FINDINGS: Lower chest: Subsegmental atelectasis within both lung bases. No significant pleural effusion. Aortic atherosclerosis. Hepatobiliary: Gallbladder is mildly distended. There is intra and extrahepatic biliary ductal dilatation. The common bile duct measures 17 mm at the porta hepatis. No calcified gallstone or visualized choledocholithiasis. No pericholecystic fat stranding. No focal hepatic lesion. Pancreas: Diffuse peripancreatic fat stranding and edema. No evidence of obstructing pancreatic mass. Borderline pancreatic ductal dilatation, 3-4 mm. Homogeneous pancreatic enhancement. No pancreatic necrosis. No acute peripancreatic collection. Spleen: Normal in size without focal abnormality. The splenic vein is patent. Adrenals/Urinary Tract: No adrenal nodule. No hydronephrosis. 3 mm nonobstructing stone in the upper pole of the right kidney. Suspicious renal lesion. Unremarkable urinary bladder. Stomach/Bowel: Small hiatal hernia. The stomach is nondistended. There is a periampullary duodenal diverticulum. Mild wall thickening about the fourth portion of the duodenum. Mild Peri duodenal fat stranding and edema. No other small bowel inflammation. No small bowel obstruction. Normal appendix. No terminal ileal inflammation. Scattered colonic  diverticula. The majority of the colon is decompressed which limits assessment for wall thickening. There is no significant pericolonic edema or evidence of colonic inflammation. Vascular/Lymphatic: Aortic atherosclerosis and tortuosity. No aneurysm. The portal and splenic veins are patent. No enlarged lymph nodes in the abdomen or pelvis. Reproductive: Unchanged 18 mm simple appearing right ovarian cyst series 2, image 61. Anteverted uterus. No suspicious adnexal mass. Other: Retroperitoneal fat stranding and edema with trace free fluid. No free air or focal fluid collection. No abdominal wall hernia. Musculoskeletal: Degenerative change throughout the spine. There are no acute or suspicious osseous abnormalities. IMPRESSION: 1. Findings consistent with acute pancreatitis. No evidence of pancreatic necrosis or acute peripancreatic collection. 2. Intra and extrahepatic biliary ductal dilatation with common bile duct measuring 17 mm. No calcified gallstone or visualized choledocholithiasis. This may be related to pancreatic inflammation or periampullary duodenal diverticulum. Recommend correlation with LFTs. Consider further evaluation with MRCP. 3. Wall thickening about the duodenum with adjacent fat stranding, felt to be secondary  to pancreatic inflammation. Primary and duodenal inflammation is felt less likely. No other evidence of bowel inflammation. 4. Colonic diverticulosis without diverticulitis. 5. Nonobstructing right renal stone. 6. Simple appearing right ovarian cyst measuring 18 mm. This is unchanged from 2021 exam and most likely benign. No follow-up imaging is recommended per consensus guidelines. Aortic Atherosclerosis (ICD10-I70.0). Electronically Signed   By: Narda Rutherford M.D.   On: 06/18/2023 15:09    Procedures Procedures    Medications Ordered in ED Medications  piperacillin-tazobactam (ZOSYN) IVPB 3.375 g (has no administration in time range)    Followed by  piperacillin-tazobactam  (ZOSYN) IVPB 3.375 g (has no administration in time range)  sodium chloride 0.9 % bolus 1,000 mL (has no administration in time range)  HYDROmorphone (DILAUDID) injection 0.5 mg (0.5 mg Intravenous Given 06/18/23 1345)  ketorolac (TORADOL) 15 MG/ML injection 15 mg (15 mg Intravenous Given 06/18/23 1342)  iohexol (OMNIPAQUE) 300 MG/ML solution 100 mL (100 mLs Intravenous Contrast Given 06/18/23 1448)    ED Course/ Medical Decision Making/ A&P Clinical Course as of 06/18/23 1540  Sat Jun 18, 2023  1449 Lipase(!): 1,608 [MT]  [address] [place] GI team consultant reports no indication for MRI at this time - treat for acute pancreatitis, check triglycerides - no need for emergent GI consultation unless pt's labs worsening or new concerns arrive.  Signout given to hospitalist dr Lyn Hollingshead [MT]    Clinical Course User Index [MT] Renaye Rakers Kermit Balo, MD                                 Medical Decision Making Amount and/or Complexity of Data Reviewed Labs: ordered. Decision-making details documented in ED Course. Radiology: ordered.  Risk Prescription drug management. Decision regarding hospitalization.   This patient presents to the ED with concern for diffuse abdominal pain. This involves an extensive number of treatment options, and is a complaint that carries with it a high risk of complications and morbidity.  The differential diagnosis includes Crohn's exacerbation versus colitis versus diverticulitis versus biliary disease versus pancreatitis versus other  Co-morbidities that complicate the patient evaluation: History of Crohn's at high risk of bowel complication  External records from outside source obtained and reviewed including CT abdomen pelvis 07/22/20 which at that time showed diffuse wall thickening of the ileum and colonic wall, unremarkable gallbladder, no aortic aneurysm  I ordered and personally interpreted labs.  The pertinent results include: Lipase 1600.  CMP and CBC unremarkable.  No  leukocytosis.  Liver enzymes within normal limits.  Patient had UA at urgent care with no evidence of infection  I ordered imaging studies including CT abdomen pelvis I independently visualized and interpreted imaging which showed pancreatic inflammation with common bile duct dilatation I agree with the radiologist interpretation  The patient was maintained on a cardiac monitor.  I personally viewed and interpreted the cardiac monitored which showed an underlying rhythm of: Sinus rhythm  Per my interpretation the patient's ECG shows no acute ischemic findings  I ordered medication including IV fluids, pain medication, antibiotics for pancreatitis, duodenitis, abdominal pain  I have reviewed the patients home medicines and have made adjustments as needed  After the interventions noted above, I reevaluated the patient and found that they have: improved   Dispostion:  After consideration of the diagnostic results and the patients response to treatment, I feel that the patent would benefit from medical admission.  At this time I suspect is  likely having acute pancreatitis, likely from gallstone.  She will likely need hospitalization.  Per my discussion with gastroenterology, there is no indication for emergent MRCP at this time, or GI consultation, unless the patient's labs were to change or she would develop evidence of biliary obstruction.    There is no overt gallbladder wall thickening or evidence of inflammation to raise concern for acute cholecystitis.  Patient and her children at the bedside were updated regarding her diagnosis and plan for admission and in agreement    Final Clinical Impression(s) / ED Diagnoses Final diagnoses:  Acute pancreatitis, unspecified complication status, unspecified pancreatitis type  Duodenitis    Rx / DC Orders ED Discharge Orders     None         , Kermit Balo, MD 06/18/23 1526    Terald Sleeper, MD 06/18/23 [phone]

## 2023-06-18 NOTE — Assessment & Plan Note (Addendum)
More likely reactive in the setting of pancreatitis we will continue to monitor Patient does not endorse any fevers or chills Biliary tract dilatation also thought to be secondary to pancreatitis No white blood cell count elevation no elevated LFTs Pt was started on Zosyn  If stable would deescalate in AM

## 2023-06-18 NOTE — Assessment & Plan Note (Signed)
Cause unclear  CT of abdomen showing acute pancreatitis  no evidence of pseudocyst Lipase     Component Value Date/Time   LIPASE 1,608 (H) 06/18/2023 1351   Lipid Panel ordered  Will rehydrate with aggressive IV fluids Keep n.p.o. Follow clinically   -Check lipid panel -  there have been case reports of Entyvio induced pancreatitis Rec discuss with Dr. Leone Payor when pt sees him next Control pain with IV pain medications If no significant improvement in the next 24 hours may benefit from GI consult

## 2023-06-18 NOTE — H&P (Incomplete)
HISTORY AND PHYSICAL    TKEYA STENCIL   MWU:[id number] DOB: 08/17/1941   Date of Service: 06/18/23 Requesting physician/APP from ED: Treatment Team:  Attending Provider: Sunnie Nielsen, DO  PCP: [name], MD     Jade Hernandez is an 82 y.o. female with past medical history Crohn's disease, essential hypertension.  She presented to the [facility] emergency department with epigastric/abdominal pain x 1 day, began earlier this morning, noted as severe, described as diffuse across upper abdomen.  History of Crohn's disease, but this does not feel similar to previous Crohn's exacerbations.  No nausea, no vomiting, no fever, no chills.  Was seen by urgent care this morning, sent to ED.  In ED -VSS, CMP no concerns.  Lipase 1608, CBC no concerns other than microcytosis with MCV 78.7, normal hemoglobin 13.  CT abdomen/pelvis with contrast consistent with acute pancreatitis, no pancreatic necrosis/abscess, intra and extrahepatic biliary ductal dilatation but no obvious gallstone disease, sequelae of pancreatic inflammation with wall thickening around duodenum and this also may be contributing to biliary ductal dilatation.    Consultants:  Gastroenterology - by EDP, will see in AM tomorrow  Procedures: none      ASSESSMENT & PLAN:   Principal Problem:   Pancreatitis Active Problems:   Hypertension   Hyperlipidemia   GERD (gastroesophageal reflux disease)   Crohn's disease of both small and large intestine with other complication (HCC)   Long-term use of immunosuppressant medication _ Entyvio  Acute Pancreatitis N.p.o. for now pending GI evaluation IV fluids GI to evaluate in a.m., no urgent need for MRCP at this time since LFTs WNL Advance diet per GI TG pending Trend lipase and LFT  History of Crohn's disease GI follow-up  Microcytosis without anemia Follow outpatient with iron panel    DVT prophylaxis: lovenox  Pertinent IV fluids/nutrition: NS 100  mL/h, NPO for now pending GI evaluation  Central lines / invasive devices: none  Code Status: *** ACP documentation reviewed: 06/18/23 none on file in [place]  Family communication: ***  Current Admission Status: inpatient  TOC needs / Dispo plan: TBD, expect d/c home  Barriers to discharge / significant pending items: clinical improvement, tolerating diet, GI clearance, anticipate will be here 2-3 days total               Review of Systems:  ROS     has a past medical history of Adenomatous colon polyp, Arthritis, Crohn's disease of both small and large intestine with other complication (HCC) (10/23/2020), Diverticulosis, High cholesterol, Hypertension, IBS (irritable bowel syndrome), Palpitations, Pre-diabetes, and UTI (urinary tract infection).  No current facility-administered medications on file prior to encounter.   Current Outpatient Medications on File Prior to Encounter  Medication Sig Dispense Refill   acetaminophen (TYLENOL) 500 MG tablet Take 1,000 mg by mouth in the morning and at bedtime.     amLODipine (NORVASC) 2.5 MG tablet Take 1 tablet (2.5 mg total) by mouth daily. (Patient not taking: Reported on 04/26/2023) 90 tablet 3   metoprolol succinate (TOPROL-XL) 100 MG 24 hr tablet TAKE 1 TABLET(100 MG) BY MOUTH DAILY WITH OR IMMEDIATELY FOLLOWING A MEAL 90 tablet 3   Probiotic Product (PROBIOTIC DAILY PO) Take 1 capsule by mouth in the morning.     simvastatin (ZOCOR) 40 MG tablet Take 40 mg by mouth every evening.     sodium chloride 0.9 % SOLN 250 mL with vedolizumab 300 MG SOLR 300 mg Inject 300 mg into the vein every 8 (eight)  weeks.     valsartan (DIOVAN) 160 MG tablet Take 1 tablet (160 mg total) by mouth daily. 90 tablet 3      Allergies  Allergen Reactions   Feraheme [Ferumoxytol] Shortness Of Breath and Palpitations    Severe pain all over      family history includes Diabetes in her mother; Heart disease in her brother and father; Hypertension  in her mother. 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          Objective Findings:  Vitals:   06/18/23 1157 06/18/23 1159 06/18/23 1300 06/18/23 1608  BP:   127/71   Pulse:   91   Resp:   16   Temp:    98.3 F (36.8 C)  TempSrc:    Oral  SpO2: 98%  91%   Weight:  77.7 kg    Height:  5\' 3"  (1.6 m)      Intake/Output Summary (Last 24 hours) at 06/18/2023 1735 Last data filed at 06/18/2023 1722 Gross per 24 hour  Intake 1005.32 ml  Output --  Net 1005.32 ml   Filed Weights   06/18/23 1159  Weight: 77.7 kg    Examination:  Physical Exam       Scheduled Medications:    Continuous Infusions:  piperacillin-tazobactam (ZOSYN)  IV      PRN Medications:    Antimicrobials:  Anti-infectives (From admission, onward)    Start     Dose/Rate Route Frequency Ordered Stop   06/18/23 2330  piperacillin-tazobactam (ZOSYN) IVPB 3.375 g       Placed in "Followed by" Linked Group   3.375 g 12.5 mL/hr over 240 Minutes Intravenous Every 8 hours 06/18/23 1522     06/18/23 1530  piperacillin-tazobactam (ZOSYN) IVPB 3.375 g       Placed in "Followed by" Linked Group   3.375 g 100 mL/hr over 30 Minutes Intravenous  Once 06/18/23 1522 06/18/23 1637           Data Reviewed: I have personally reviewed  following labs and imaging studies  CBC: Recent Labs  Lab 06/18/23 1351  WBC 9.4  NEUTROABS 8.3*  HGB 13.0  HCT 41.4  MCV 78.7*  PLT 151   Basic Metabolic Panel: Recent Labs  Lab 06/18/23 1351  NA 139  K 3.9  CL 105  CO2 25  GLUCOSE 105*  BUN 16  CREATININE 0.49  CALCIUM 9.5   GFR: Estimated Creatinine Clearance: 55.3 mL/min (by C-G formula based on SCr of 0.49 mg/dL). Liver Function Tests: Recent Labs  Lab 06/18/23 1351  AST 27  ALT 13  ALKPHOS 68  BILITOT 0.8  PROT 6.7  ALBUMIN 4.0   Recent Labs  Lab 06/18/23 1351  LIPASE 1,608*   No results for input(s): "AMMONIA" in the last 168 hours. Coagulation Profile: No results for  input(s): "INR", "PROTIME" in the last 168 hours. Cardiac Enzymes: No results for input(s): "CKTOTAL", "CKMB", "CKMBINDEX", "TROPONINI" in the last 168 hours. BNP (last 3 results) No results for input(s): "PROBNP" in the last 8760 hours. HbA1C: No results for input(s): "HGBA1C" in the last 72 hours. CBG: No results for input(s): "GLUCAP" in the last 168 hours. Lipid Profile: No results for input(s): "CHOL", "HDL", "LDLCALC", "TRIG", "CHOLHDL", "LDLDIRECT" in the last 72 hours. Thyroid Function Tests: No results for input(s): "TSH", "T4TOTAL", "FREET4", "T3FREE", "THYROIDAB" in the last 72 hours. Anemia Panel: No results for input(s): "VITAMINB12", "FOLATE", "FERRITIN", "TIBC", "IRON", "RETICCTPCT" in the last 72 hours. Most Recent Urinalysis On File:  No results found for: "COLORURINE", "APPEARANCEUR", "LABSPEC", "PHURINE", "GLUCOSEU", "HGBUR", "BILIRUBINUR", "KETONESUR", "PROTEINUR", "UROBILINOGEN", "NITRITE", "LEUKOCYTESUR" Sepsis Labs: @LABRCNTIP (procalcitonin:4,lacticidven:4)  No results found for this or any previous visit (from the past 240 hour(s)).       Radiology Studies: CT ABDOMEN PELVIS W CONTRAST  Result Date: 06/18/2023 CLINICAL DATA:  Acute abdominal pain.  History of Crohn's disease. EXAM: CT ABDOMEN AND  PELVIS WITH CONTRAST TECHNIQUE: Multidetector CT imaging of the abdomen and pelvis was performed using the standard protocol following bolus administration of intravenous contrast. RADIATION DOSE REDUCTION: This exam was performed according to the departmental dose-optimization program which includes automated exposure control, adjustment of the mA and/or kV according to patient size and/or use of iterative reconstruction technique. CONTRAST:  OMNIPAQUE IOHEXOL 300 MG/ML  SOLN COMPARISON:  CT 07/22/2020 FINDINGS: Lower chest: Subsegmental atelectasis within both lung bases. No significant pleural effusion. Aortic atherosclerosis. Hepatobiliary: Gallbladder is mildly distended. There is intra and extrahepatic biliary ductal dilatation. The common bile duct measures 17 mm at the porta hepatis. No calcified gallstone or visualized choledocholithiasis. No pericholecystic fat stranding. No focal hepatic lesion. Pancreas: Diffuse peripancreatic fat stranding and edema. No evidence of obstructing pancreatic mass. Borderline pancreatic ductal dilatation, 3-4 mm. Homogeneous pancreatic enhancement. No pancreatic necrosis. No acute peripancreatic collection. Spleen: Normal in size without focal abnormality. The splenic vein is patent. Adrenals/Urinary Tract: No adrenal nodule. No hydronephrosis. 3 mm nonobstructing stone in the upper pole of the right kidney. Suspicious renal lesion. Unremarkable urinary bladder. Stomach/Bowel: Small hiatal hernia. The stomach is nondistended. There is a periampullary duodenal diverticulum. Mild wall thickening about the fourth portion of the duodenum. Mild Peri duodenal fat stranding and edema. No other small bowel inflammation. No small bowel obstruction. Normal appendix. No terminal ileal inflammation. Scattered colonic diverticula. The majority of the colon is decompressed which limits assessment for wall thickening. There is no significant pericolonic edema or evidence of colonic  inflammation. Vascular/Lymphatic: Aortic atherosclerosis and tortuosity. No aneurysm. The portal and splenic veins are patent. No enlarged lymph nodes in the abdomen or pelvis. Reproductive: Unchanged 18 mm simple appearing right ovarian cyst series 2, image 61. Anteverted uterus. No suspicious adnexal mass. Other: Retroperitoneal fat stranding and edema with trace free fluid. No free air or focal fluid collection. No abdominal wall hernia. Musculoskeletal: Degenerative change throughout the spine. There are no acute or suspicious osseous abnormalities. IMPRESSION: 1. Findings consistent with acute pancreatitis. No evidence of pancreatic necrosis or acute peripancreatic collection. 2. Intra and extrahepatic biliary ductal dilatation with common bile duct measuring 17 mm. No calcified gallstone or visualized choledocholithiasis. This may be related to pancreatic inflammation or periampullary duodenal diverticulum. Recommend correlation with LFTs. Consider further evaluation with MRCP. 3. Wall thickening about the duodenum with adjacent fat stranding, felt to be secondary to pancreatic inflammation. Primary and duodenal  inflammation is felt less likely. No other evidence of bowel inflammation. 4. Colonic diverticulosis without diverticulitis. 5. Nonobstructing right renal stone. 6. Simple appearing right ovarian cyst measuring 18 mm. This is unchanged from 2021 exam and most likely benign. No follow-up imaging is recommended per consensus guidelines. Aortic Atherosclerosis (ICD10-I70.0). Electronically Signed   By: Narda Rutherford M.D.   On: 06/18/2023 15:09             LOS: 0 days    Time spent: ***    Sunnie Nielsen, DO [facility] 06/18/2023, 5:35 PM    Dictation software may have been used to generate the above note. Typos may occur and escape review in typed/dictated notes. Please contact Dr Lyn Hollingshead directly for clarity if needed.  Staff may message me via secure chat in Epic   but this may not receive an immediate response,  please page me for urgent matters!  If 7PM-7AM, please contact night coverage [email or web address]

## 2023-06-18 NOTE — Hospital Course (Addendum)
Jade Hernandez is an 82 y.o. female with past medical history Crohn's disease, essential hypertension.  She presented to the [facility] emergency department with epigastric/abdominal pain x 1 day, began earlier this morning, noted as severe, described as diffuse across upper abdomen.  History of Crohn's disease, but this does not feel similar to previous Crohn's exacerbations.  No nausea, no vomiting, no fever, no chills.  Was seen by urgent care this morning, sent to ED.  In ED -VSS, CMP no concerns.  Lipase 1608, CBC no concerns other than microcytosis with MCV 78.7, normal hemoglobin 13.  CT abdomen/pelvis with contrast consistent with acute pancreatitis, no pancreatic necrosis/abscess, intra and extrahepatic biliary ductal dilatation but no obvious gallstone disease, sequelae of pancreatic inflammation with wall thickening around duodenum and this also may be contributing to biliary ductal dilatation.    Consultants:  Gastroenterology - by EDP, will see in AM tomorrow  Procedures: none      ASSESSMENT & PLAN:   Principal Problem:   Pancreatitis Active Problems:   Hypertension   Hyperlipidemia   GERD (gastroesophageal reflux disease)   Crohn's disease of both small and large intestine with other complication (HCC)   Long-term use of immunosuppressant medication _ Entyvio  Acute Pancreatitis N.p.o. for now pending GI evaluation IV fluids GI to evaluate in a.m., no urgent need for MRCP at this time since LFTs WNL Advance diet per GI TG pending Trend lipase and LFT  History of Crohn's disease GI follow-up  Microcytosis without anemia Follow outpatient with iron panel    DVT prophylaxis: lovenox  Pertinent IV fluids/nutrition: NS 100 mL/h, NPO for now pending GI evaluation  Central lines / invasive devices: none  Code Status: *** ACP documentation reviewed: 06/18/23 none on file in [place]  Family communication: ***  Current Admission Status: inpatient  TOC needs /  Dispo plan: TBD, expect d/c home  Barriers to discharge / significant pending items: clinical improvement, tolerating diet, GI clearance, anticipate will be here 2-3 days total

## 2023-06-18 NOTE — Assessment & Plan Note (Signed)
Hold NOrvasc given risk of Pancreatitis Allow permissive HTN for tonight

## 2023-06-18 NOTE — ED Notes (Signed)
Jade Hernandez with cl called for transport

## 2023-06-19 ENCOUNTER — Encounter (HOSPITAL_COMMUNITY): Payer: Self-pay | Admitting: Internal Medicine

## 2023-06-19 DIAGNOSIS — K859 Acute pancreatitis without necrosis or infection, unspecified: Secondary | ICD-10-CM

## 2023-06-19 LAB — LIPID PANEL
Cholesterol: 87 mg/dL (ref 0–200)
HDL: 35 mg/dL — ABNORMAL LOW (ref >40)
LDL Cholesterol: 35 mg/dL (ref 0–99)
Total CHOL/HDL Ratio: 2.5 ratio
Triglycerides: 84 mg/dL (ref <150)
VLDL: 17 mg/dL (ref 0–40)

## 2023-06-19 LAB — CBC
HCT: 33.5 % — ABNORMAL LOW (ref 36.0–46.0)
Hemoglobin: 10.2 g/dL — ABNORMAL LOW (ref 12.0–15.0)
MCH: 25.2 pg — ABNORMAL LOW (ref 26.0–34.0)
MCHC: 30.4 g/dL (ref 30.0–36.0)
MCV: 82.9 fL (ref 80.0–100.0)
Platelets: 181 10*3/uL (ref 150–400)
RBC: 4.04 MIL/uL (ref 3.87–5.11)
RDW: 15.2 % (ref 11.5–15.5)
WBC: 7.2 10*3/uL (ref 4.0–10.5)
nRBC: 0 % (ref 0.0–0.2)

## 2023-06-19 LAB — PHOSPHORUS: Phosphorus: 2.8 mg/dL (ref 2.5–4.6)

## 2023-06-19 LAB — MAGNESIUM: Magnesium: 2 mg/dL (ref 1.7–2.4)

## 2023-06-19 LAB — COMPREHENSIVE METABOLIC PANEL WITH GFR
ALT: 13 U/L (ref 0–44)
AST: 16 U/L (ref 15–41)
Albumin: 2.6 g/dL — ABNORMAL LOW (ref 3.5–5.0)
Alkaline Phosphatase: 50 U/L (ref 38–126)
Anion gap: 6 (ref 5–15)
BUN: 17 mg/dL (ref 8–23)
CO2: 23 mmol/L (ref 22–32)
Calcium: 8.3 mg/dL — ABNORMAL LOW (ref 8.9–10.3)
Chloride: 108 mmol/L (ref 98–111)
Creatinine, Ser: 0.47 mg/dL (ref 0.44–1.00)
GFR, Estimated: >60 mL/min (ref >60)
Glucose, Bld: 98 mg/dL (ref 70–99)
Potassium: 3.3 mmol/L — ABNORMAL LOW (ref 3.5–5.1)
Sodium: 137 mmol/L (ref 135–145)
Total Bilirubin: 1.3 mg/dL — ABNORMAL HIGH (ref 0.3–1.2)
Total Protein: 5.4 g/dL — ABNORMAL LOW (ref 6.5–8.1)

## 2023-06-19 LAB — LIPASE, BLOOD: Lipase: 377 U/L — ABNORMAL HIGH (ref 11–51)

## 2023-06-19 MED ORDER — POTASSIUM CHLORIDE CRYS ER 20 MEQ PO TBCR
20.0000 meq | EXTENDED_RELEASE_TABLET | Freq: Once | ORAL | Status: AC
  Administered 2023-06-19: 20 meq via ORAL
  Filled 2023-06-19: qty 1

## 2023-06-19 NOTE — Progress Notes (Signed)
GI ATTENDING  Please note that the formal consultation was performed today June 19, 2023.  The note was initiated by the physician assistant yesterday, thus its filing on June 18, 2023.  Wilhemina Bonito. Eda Keys., M.D. [facility] Division of Gastroenterology

## 2023-06-19 NOTE — Plan of Care (Signed)
  Problem: Education: Goal: Knowledge of General Education information will improve Description: Including pain rating scale, medication(s)/side effects and non-pharmacologic comfort measures Outcome: Progressing   Problem: Health Behavior/Discharge Planning: Goal: Ability to manage health-related needs will improve Outcome: Progressing   Problem: Nutrition: Goal: Adequate nutrition will be maintained Outcome: Progressing   

## 2023-06-19 NOTE — Plan of Care (Signed)

## 2023-06-19 NOTE — Progress Notes (Signed)
PROGRESS NOTE  Jade Hernandez RUE:[id number] DOB: 02-28-1941 DOA: 06/18/2023 PCP: [name], MD  HPI/Recap of past 24 hours:  Jade Hernandez is an 82 y.o. female with past medical history Crohn's disease, essential hypertension.  She presented to the [facility] emergency department with epigastric/abdominal pain x 1 day, began earlier this morning, noted as severe, described as diffuse across upper abdomen.  History of Crohn's disease, but this does not feel similar to previous Crohn's exacerbations.  No nausea, no vomiting, no fever, no chills.  Was seen by urgent care this morning, sent to ED.   In ED -VSS, CMP no concerns.  Lipase 1608, CBC no concerns other than microcytosis with MCV 78.7, normal hemoglobin 13.  CT abdomen/pelvis with contrast consistent with acute pancreatitis, no pancreatic necrosis/abscess, intra and extrahepatic biliary ductal dilatation but no obvious gallstone disease, sequelae of pancreatic inflammation with wall thickening around duodenum and this also may be contributing to biliary ductal dilatation.   06/19/23: Seen and examined with daughter at her bedside.  Epigastric pain is improved.  Denies any nausea at this time.  Started on clear liquid diet.  Assessment/Plan: Principal Problem:   Pancreatitis Active Problems:   Hypertension   Hyperlipidemia   GERD (gastroesophageal reflux disease)   Crohn's disease of both small and large intestine with other complication (HCC)   Long-term use of immunosuppressant medication _ Entyvio   Duodenitis  Acute Pancreatitis Unclear etiology, possible side effect from vedolizumab her Crohn's disease medication GI consulted to assist with the management. Continue supportive care with IV fluid, pain medication, IV antiemetics as needed Lipase level is downtrending Repeat lipase level in the morning  Hypokalemia Repleted orally. Repeat chemistry panel and obtain magnesium level in the morning  Isolated elevated  T. bili T. bili 1.3, uptrending  The patient has common bile duct dilatation Will obtain MRCP GI is following   History of Crohn's disease GI following.   Microcytosis without anemia Follow outpatient with iron panel   Obesity BMI 30 Recommend weight loss outpatient with regular physical activity and healthy diet.     DVT prophylaxis: SCDs Pertinent IV fluids/nutrition: NS 100 mL/h, NPO for now pending GI evaluation  Central lines / invasive devices: none   Code Status: Full code ACP documentation reviewed: 06/18/23 none on file in [place]  Family communication: Daughter at bedside.  Updated.   Current Admission Status: inpatient  TOC needs / Dispo plan: TBD, expect d/c home  Barriers to discharge / significant pending items: clinical improvement, tolerating diet, GI clearance, anticipate will be here 2-3 days total          Status is: Inpatient  The patient requires at least 2 midnights for further evaluation and treatment   Objective: Vitals:   06/18/23 2247 06/19/23 0057 06/19/23 0428 06/19/23 1043  BP:  (!) 119/58 125/60 (!) 120/58  Pulse:  84 80 73  Resp:  14 14   Temp: 98.2 F (36.8 C) 98.9 F (37.2 C) 99.7 F (37.6 C) 98.9 F (37.2 C)  TempSrc: Oral Oral Oral Oral  SpO2:  94% 95% 98%  Weight:      Height:        Intake/Output Summary (Last 24 hours) at 06/19/2023 1051 Last data filed at 06/19/2023 1000 Gross per 24 hour  Intake 2389.12 ml  Output 500 ml  Net 1889.12 ml   Filed Weights   06/18/23 1159  Weight: 77.7 kg    Exam:  General: 82 y.o. year-old female well  developed well nourished in no acute distress.  Alert and oriented x3. Cardiovascular: Regular rate and rhythm with no rubs or gallops.  No thyromegaly or JVD noted.   Respiratory: Clear to auscultation with no wheezes or rales. Good inspiratory effort. Abdomen: Soft nontender nondistended with normal bowel sounds x4 quadrants. Musculoskeletal: No lower extremity edema. 2/4 pulses in  all 4 extremities. Skin: No ulcerative lesions noted or rashes, Psychiatry: Mood is appropriate for condition and setting   Data Reviewed: CBC: Recent Labs  Lab 06/18/23 1351 06/19/23 0541  WBC 9.4 7.2  NEUTROABS 8.3*  --   HGB 13.0 10.2*  HCT 41.4 33.5*  MCV 78.7* 82.9  PLT 151 181   Basic Metabolic Panel: Recent Labs  Lab 06/18/23 1351 06/18/23 2127 06/19/23 0541  NA 139 138 137  K 3.9 4.3 3.3*  CL 105 106 108  CO2 25 24 23   GLUCOSE 105* 113* 98  BUN 16 17 17   CREATININE 0.49 0.51 0.47  CALCIUM 9.5 8.9 8.3*  MG  --  2.0 2.0  PHOS  --  3.7 2.8   GFR: Estimated Creatinine Clearance: 55.3 mL/min (by C-G formula based on SCr of 0.47 mg/dL). Liver Function Tests: Recent Labs  Lab 06/18/23 1351 06/19/23 0541  AST 27 16  ALT 13 13  ALKPHOS 68 50  BILITOT 0.8 1.3*  PROT 6.7 5.4*  ALBUMIN 4.0 2.6*   Recent Labs  Lab 06/18/23 1351 06/19/23 0541  LIPASE 1,608* 377*   No results for input(s): "AMMONIA" in the last 168 hours. Coagulation Profile: No results for input(s): "INR", "PROTIME" in the last 168 hours. Cardiac Enzymes: Recent Labs  Lab 06/18/23 2127  CKTOTAL 27*   BNP (last 3 results) No results for input(s): "PROBNP" in the last 8760 hours. HbA1C: No results for input(s): "HGBA1C" in the last 72 hours. CBG: No results for input(s): "GLUCAP" in the last 168 hours. Lipid Profile: Recent Labs    06/18/23 1351 06/19/23 0541  CHOL  --  87  HDL  --  35*  LDLCALC  --  35  TRIG 113 84  CHOLHDL  --  2.5   Thyroid Function Tests: Recent Labs    06/18/23 2127  TSH 2.539   Anemia Panel: No results for input(s): "VITAMINB12", "FOLATE", "FERRITIN", "TIBC", "IRON", "RETICCTPCT" in the last 72 hours. Urine analysis: No results found for: "COLORURINE", "APPEARANCEUR", "LABSPEC", "PHURINE", "GLUCOSEU", "HGBUR", "BILIRUBINUR", "KETONESUR", "PROTEINUR", "UROBILINOGEN", "NITRITE", "LEUKOCYTESUR" Sepsis  Labs: @LABRCNTIP (procalcitonin:4,lacticidven:4)  )No results found for this or any previous visit (from the past 240 hour(s)).    Studies: CT ABDOMEN PELVIS W CONTRAST  Result Date: 06/18/2023 CLINICAL DATA:  Acute abdominal pain.  History of Crohn's disease. EXAM: CT ABDOMEN AND PELVIS WITH CONTRAST TECHNIQUE: Multidetector CT imaging of the abdomen and pelvis was performed using the standard protocol following bolus administration of intravenous contrast. RADIATION DOSE REDUCTION: This exam was performed according to the departmental dose-optimization program which includes automated exposure control, adjustment of the mA and/or kV according to patient size and/or use of iterative reconstruction technique. CONTRAST:  OMNIPAQUE IOHEXOL 300 MG/ML  SOLN COMPARISON:  CT 07/22/2020 FINDINGS: Lower chest: Subsegmental atelectasis within both lung bases. No significant pleural effusion. Aortic atherosclerosis. Hepatobiliary: Gallbladder is mildly distended. There is intra and extrahepatic biliary ductal dilatation. The common bile duct measures 17 mm at the porta hepatis. No calcified gallstone or visualized choledocholithiasis. No pericholecystic fat stranding. No focal hepatic lesion. Pancreas: Diffuse peripancreatic fat stranding and edema. No evidence of obstructing  pancreatic mass. Borderline pancreatic ductal dilatation, 3-4 mm. Homogeneous pancreatic enhancement. No pancreatic necrosis. No acute peripancreatic collection. Spleen: Normal in size without focal abnormality. The splenic vein is patent. Adrenals/Urinary Tract: No adrenal nodule. No hydronephrosis. 3 mm nonobstructing stone in the upper pole of the right kidney. Suspicious renal lesion. Unremarkable urinary bladder. Stomach/Bowel: Small hiatal hernia. The stomach is nondistended. There is a periampullary duodenal diverticulum. Mild wall thickening about the fourth portion of the duodenum. Mild Peri duodenal fat stranding and edema. No  other small bowel inflammation. No small bowel obstruction. Normal appendix. No terminal ileal inflammation. Scattered colonic diverticula. The majority of the colon is decompressed which limits assessment for wall thickening. There is no significant pericolonic edema or evidence of colonic inflammation. Vascular/Lymphatic: Aortic atherosclerosis and tortuosity. No aneurysm. The portal and splenic veins are patent. No enlarged lymph nodes in the abdomen or pelvis. Reproductive: Unchanged 18 mm simple appearing right ovarian cyst series 2, image 61. Anteverted uterus. No suspicious adnexal mass. Other: Retroperitoneal fat stranding and edema with trace free fluid. No free air or focal fluid collection. No abdominal wall hernia. Musculoskeletal: Degenerative change throughout the spine. There are no acute or suspicious osseous abnormalities. IMPRESSION: 1. Findings consistent with acute pancreatitis. No evidence of pancreatic necrosis or acute peripancreatic collection. 2. Intra and extrahepatic biliary ductal dilatation with common bile duct measuring 17 mm. No calcified gallstone or visualized choledocholithiasis. This may be related to pancreatic inflammation or periampullary duodenal diverticulum. Recommend correlation with LFTs. Consider further evaluation with MRCP. 3. Wall thickening about the duodenum with adjacent fat stranding, felt to be secondary to pancreatic inflammation. Primary and duodenal inflammation is felt less likely. No other evidence of bowel inflammation. 4. Colonic diverticulosis without diverticulitis. 5. Nonobstructing right renal stone. 6. Simple appearing right ovarian cyst measuring 18 mm. This is unchanged from 2021 exam and most likely benign. No follow-up imaging is recommended per consensus guidelines. Aortic Atherosclerosis (ICD10-I70.0). Electronically Signed   By: Narda Rutherford M.D.   On: 06/18/2023 15:09    Scheduled Meds:  metoprolol succinate  100 mg Oral Daily    potassium chloride  20 mEq Oral Once   simvastatin  40 mg Oral QPM    Continuous Infusions:  piperacillin-tazobactam (ZOSYN)  IV 3.375 g (06/19/23 0739)     LOS: 1 day     Darlin Drop, MD [facility] Pager [phone]  If 7PM-7AM, please contact night-coverage [email or web address] Password [email or web address] 06/19/2023, 10:51 AM

## 2023-06-20 DIAGNOSIS — K859 Acute pancreatitis without necrosis or infection, unspecified: Secondary | ICD-10-CM | POA: Diagnosis not present

## 2023-06-20 DIAGNOSIS — K838 Other specified diseases of biliary tract: Secondary | ICD-10-CM

## 2023-06-20 DIAGNOSIS — K5090 Crohn's disease, unspecified, without complications: Secondary | ICD-10-CM | POA: Diagnosis not present

## 2023-06-20 LAB — COMPREHENSIVE METABOLIC PANEL
ALT: 11 U/L (ref 0–44)
AST: 15 U/L (ref 15–41)
Albumin: 2.9 g/dL — ABNORMAL LOW (ref 3.5–5.0)
Alkaline Phosphatase: 51 U/L (ref 38–126)
Anion gap: 7 (ref 5–15)
BUN: 7 mg/dL — ABNORMAL LOW (ref 8–23)
CO2: 21 mmol/L — ABNORMAL LOW (ref 22–32)
Calcium: 8.5 mg/dL — ABNORMAL LOW (ref 8.9–10.3)
Chloride: 110 mmol/L (ref 98–111)
Creatinine, Ser: 0.46 mg/dL (ref 0.44–1.00)
GFR, Estimated: >60 mL/min (ref >60)
Glucose, Bld: 101 mg/dL — ABNORMAL HIGH (ref 70–99)
Potassium: 3.6 mmol/L (ref 3.5–5.1)
Sodium: 138 mmol/L (ref 135–145)
Total Bilirubin: 0.9 mg/dL (ref 0.3–1.2)
Total Protein: 5.6 g/dL — ABNORMAL LOW (ref 6.5–8.1)

## 2023-06-20 LAB — C-REACTIVE PROTEIN: CRP: 14 mg/dL — ABNORMAL HIGH (ref <1.0)

## 2023-06-20 LAB — CBC
HCT: 33.6 % — ABNORMAL LOW (ref 36.0–46.0)
Hemoglobin: 10.5 g/dL — ABNORMAL LOW (ref 12.0–15.0)
MCH: 25.2 pg — ABNORMAL LOW (ref 26.0–34.0)
MCHC: 31.3 g/dL (ref 30.0–36.0)
MCV: 80.6 fL (ref 80.0–100.0)
Platelets: 179 10*3/uL (ref 150–400)
RBC: 4.17 MIL/uL (ref 3.87–5.11)
RDW: 15.3 % (ref 11.5–15.5)
WBC: 4.7 10*3/uL (ref 4.0–10.5)
nRBC: 0 % (ref 0.0–0.2)

## 2023-06-20 LAB — SEDIMENTATION RATE: Sed Rate: 40 mm/hr — ABNORMAL HIGH (ref 0–22)

## 2023-06-20 LAB — LIPASE, BLOOD: Lipase: 399 U/L — ABNORMAL HIGH (ref 11–51)

## 2023-06-20 MED ORDER — IRBESARTAN 75 MG PO TABS
37.5000 mg | ORAL_TABLET | Freq: Every day | ORAL | Status: DC
  Administered 2023-06-20 – 2023-06-21 (×2): 37.5 mg via ORAL
  Filled 2023-06-20 (×2): qty 1

## 2023-06-20 MED ORDER — RISAQUAD PO CAPS
1.0000 | ORAL_CAPSULE | Freq: Every morning | ORAL | Status: DC
  Administered 2023-06-21: 1 via ORAL
  Filled 2023-06-20: qty 1

## 2023-06-20 MED ORDER — VITAMIN D 25 MCG (1000 UNIT) PO TABS
1000.0000 [IU] | ORAL_TABLET | Freq: Every day | ORAL | Status: DC
  Administered 2023-06-20 – 2023-06-21 (×2): 1000 [IU] via ORAL
  Filled 2023-06-20 (×2): qty 1

## 2023-06-20 MED ORDER — AMLODIPINE BESYLATE 5 MG PO TABS
2.5000 mg | ORAL_TABLET | Freq: Every day | ORAL | Status: DC
  Administered 2023-06-20 – 2023-06-21 (×2): 2.5 mg via ORAL
  Filled 2023-06-20 (×2): qty 1

## 2023-06-20 NOTE — TOC Progression Note (Signed)
Transition of Care [facility]) - Progression Note    Patient Details  Name: Jade Hernandez MRN: [id number] Date of Birth: June 06, 1941  Transition of Care [facility]) CM/SW Contact  Geni Bers, RN Phone Number: 06/20/2023, 3:18 PM  Clinical Narrative:      Transition of Care (TOC) Screening Note   Patient Details  Name: Jade Hernandez Date of Birth: 1941-02-28   Transition of Care [facility]) CM/SW Contact:    Geni Bers, RN Phone Number: 06/20/2023, 3:18 PM    Transition of Care Department [facility]) has reviewed patient and no TOC needs have been identified at this time. We will continue to monitor patient advancement through interdisciplinary progression rounds. If new patient transition needs arise, please place a TOC consult.         Expected Discharge Plan and Services                                               Social Determinants of Health (SDOH) Interventions SDOH Screenings   Food Insecurity: No Food Insecurity (06/18/2023)  Housing: Low Risk  (06/18/2023)  Transportation Needs: No Transportation Needs (06/18/2023)  Utilities: Not At Risk (06/18/2023)  Tobacco Use: Low Risk  (06/19/2023)    Readmission Risk Interventions     No data to display

## 2023-06-20 NOTE — Progress Notes (Signed)
PROGRESS NOTE  Jade Hernandez:[id number] DOB: [birth year] DOA: 06/18/2023 PCP: [name], MD  HPI/Recap of past 24 hours:  Jade Hernandez is an 82 y.o. female with past medical history Crohn's disease, essential hypertension.  She presented to the [facility] emergency department with epigastric/abdominal pain x 1 day, began earlier this morning, noted as severe, described as diffuse across upper abdomen.  History of Crohn's disease, but this does not feel similar to previous Crohn's exacerbations.  No nausea, no vomiting, no fever, no chills.  Was seen by urgent care this morning, sent to ED.   In ED -VSS, CMP no concerns.  Lipase 1608, CBC no concerns other than microcytosis with MCV 78.7, normal hemoglobin 13.  CT abdomen/pelvis with contrast consistent with acute pancreatitis, no pancreatic necrosis/abscess, intra and extrahepatic biliary ductal dilatation but no obvious gallstone disease, sequelae of pancreatic inflammation with wall thickening around duodenum and this also may be contributing to biliary ductal dilatation.   06/20/23: Seen at her bedside.  Tolerating a clear liquid diet.  Advancing her diet to soft low-fat diet.  Mild elevation in her lipase level.  Denies any significant abdominal tenderness.  Assessment/Plan: Principal Problem:   Pancreatitis Active Problems:   Hypertension   Hyperlipidemia   GERD (gastroesophageal reflux disease)   Crohn's disease of both small and large intestine with other complication (HCC)   Long-term use of immunosuppressant medication _ Entyvio   Duodenitis  Acute Pancreatitis, improving clinically Unclear etiology, possible side effect from vedolizumab versus statin Hold off on statin Per her daughter via telephone, she has been on vedolizumab for less than 2 years. Appreciate GIs assistance. Diet advanced to soft, low-fat diet on 06/20/2023. Slight increase in lipase level.  Repeat lipase level in the morning.  Hypokalemia,  resolved  Resolved isolated elevated T. bili   History of Crohn's disease GI following.,  Will follow-up with Dr. Leone Payor outpatient.   Obesity BMI 30 Recommend weight loss outpatient with regular physical activity and healthy diet.     DVT prophylaxis: SCDs Pertinent IV fluids/nutrition: NS 100 mL/h, NPO for now pending GI evaluation  Central lines / invasive devices: none   Code Status: Full code ACP documentation reviewed: 06/18/23 none on file in [place]  Family communication: Updated patient's daughter via phone.   Current Admission Status: inpatient  TOC needs / Dispo plan: TBD, expect d/c home  Barriers to discharge / significant pending items: clinical improvement, tolerating diet, GI clearance, anticipate will be here 2-3 days total          Status is: Inpatient  The patient requires at least 2 midnights for further evaluation and treatment   Objective: Vitals:   06/19/23 1614 06/19/23 1816 06/19/23 2034 06/20/23 0509  BP: (!) 142/65 130/69 (!) 125/57 136/69  Pulse: 76 76 77 75  Resp: 16 16 16 14   Temp: 98.5 F (36.9 C) 98.3 F (36.8 C) 98.9 F (37.2 C) 98.6 F (37 C)  TempSrc: Oral Oral Oral Oral  SpO2: 100% 96% 96% 96%  Weight:      Height:        Intake/Output Summary (Last 24 hours) at 06/20/2023 1426 Last data filed at 06/20/2023 1300 Gross per 24 hour  Intake 1207.15 ml  Output 2750 ml  Net -1542.85 ml   Filed Weights   06/18/23 1159  Weight: 77.7 kg    Exam:  General: 83 y.o. year-old female well-developed well-nourished in no acute distress.  She is alert and oriented x 3.  Cardiovascular: Regular rate and rhythm with no rubs or gallops.  No thyromegaly or JVD noted.   Respiratory: Clear to auscultation with no wheezes or rales. Good inspiratory effort. Abdomen: Soft mildly tender nondistended with bowel sounds present. Musculoskeletal: No lower extremity edema. 2/4 pulses in all 4 extremities. Skin: No ulcerative lesions noted or  rashes, Psychiatry: Mood is appropriate to condition and setting.   Data Reviewed: CBC: Recent Labs  Lab 06/18/23 1351 06/19/23 0541 06/20/23 0615  WBC 9.4 7.2 4.7  NEUTROABS 8.3*  --   --   HGB 13.0 10.2* 10.5*  HCT 41.4 33.5* 33.6*  MCV 78.7* 82.9 80.6  PLT 151 181 179   Basic Metabolic Panel: Recent Labs  Lab 06/18/23 1351 06/18/23 2127 06/19/23 0541 06/20/23 0615  NA 139 138 137 138  K 3.9 4.3 3.3* 3.6  CL 105 106 108 110  CO2 25 24 23  21*  GLUCOSE 105* 113* 98 101*  BUN 16 17 17  7*  CREATININE 0.49 0.51 0.47 0.46  CALCIUM 9.5 8.9 8.3* 8.5*  MG  --  2.0 2.0  --   PHOS  --  3.7 2.8  --    GFR: Estimated Creatinine Clearance: 55.3 mL/min (by C-G formula based on SCr of 0.46 mg/dL). Liver Function Tests: Recent Labs  Lab 06/18/23 1351 06/19/23 0541 06/20/23 0615  AST 27 16 15   ALT 13 13 11   ALKPHOS 68 50 51  BILITOT 0.8 1.3* 0.9  PROT 6.7 5.4* 5.6*  ALBUMIN 4.0 2.6* 2.9*   Recent Labs  Lab 06/18/23 1351 06/19/23 0541 06/20/23 0615  LIPASE 1,608* 377* 399*   No results for input(s): "AMMONIA" in the last 168 hours. Coagulation Profile: No results for input(s): "INR", "PROTIME" in the last 168 hours. Cardiac Enzymes: Recent Labs  Lab 06/18/23 2127  CKTOTAL 27*   BNP (last 3 results) No results for input(s): "PROBNP" in the last 8760 hours. HbA1C: No results for input(s): "HGBA1C" in the last 72 hours. CBG: No results for input(s): "GLUCAP" in the last 168 hours. Lipid Profile: Recent Labs    06/18/23 1351 06/19/23 0541  CHOL  --  87  HDL  --  35*  LDLCALC  --  35  TRIG 113 84  CHOLHDL  --  2.5   Thyroid Function Tests: Recent Labs    06/18/23 2127  TSH 2.539   Anemia Panel: No results for input(s): "VITAMINB12", "FOLATE", "FERRITIN", "TIBC", "IRON", "RETICCTPCT" in the last 72 hours. Urine analysis: No results found for: "COLORURINE", "APPEARANCEUR", "LABSPEC", "PHURINE", "GLUCOSEU", "HGBUR", "BILIRUBINUR", "KETONESUR",  "PROTEINUR", "UROBILINOGEN", "NITRITE", "LEUKOCYTESUR" Sepsis Labs: @LABRCNTIP (procalcitonin:4,lacticidven:4)  )No results found for this or any previous visit (from the past 240 hour(s)).    Studies: No results found.  Scheduled Meds:  metoprolol succinate  100 mg Oral Daily    Continuous Infusions:     LOS: 2 days     Darlin Drop, MD [facility] Pager [phone]  If 7PM-7AM, please contact night-coverage [email or web address] Password [email or web address] 06/20/2023, 2:26 PM

## 2023-06-20 NOTE — Progress Notes (Signed)
Progress Note   Subjective  Chief Complaint: Pancreatitis  This morning, patient tells me that she is feeling well, she had a soft stool yesterday, no further abdominal pain, nausea or vomiting.  She is asking for her low-fat diet order.   Objective   Vital signs in last 24 hours: Temp:  [98.3 F (36.8 C)-98.9 F (37.2 C)] 98.6 F (37 C) (08/05 0509) Pulse Rate:  [73-77] 75 (08/05 0509) Resp:  [14-16] 14 (08/05 0509) BP: (120-142)/(57-69) 136/69 (08/05 0509) SpO2:  [96 %-100 %] 96 % (08/05 0509) Last BM Date : 06/19/23 General:    white female in NAD Heart:  Regular rate and rhythm; no murmurs Lungs: Respirations even and unlabored, lungs CTA bilaterally Abdomen:  Soft, nontender and nondistended. Normal bowel sounds. Psych:  Cooperative. Normal mood and affect.  Intake/Output from previous day: 08/04 0701 - 08/05 0700 In: 1307.2 [P.O.:780; I.V.:377.1; IV Piggyback:150.1] Out: 2950 [Urine:2950] Intake/Output this shift: Total I/O In: 0  Out: 300 [Urine:300]  Lab Results: Recent Labs    06/18/23 1351 06/19/23 0541 06/20/23 0615  WBC 9.4 7.2 4.7  HGB 13.0 10.2* 10.5*  HCT 41.4 33.5* 33.6*  PLT 151 181 179   BMET Recent Labs    06/18/23 2127 06/19/23 0541 06/20/23 0615  NA 138 137 138  K 4.3 3.3* 3.6  CL 106 108 110  CO2 24 23 21*  GLUCOSE 113* 98 101*  BUN 17 17 7*  CREATININE 0.51 0.47 0.46  CALCIUM 8.9 8.3* 8.5*   LFT Recent Labs    06/20/23 0615  PROT 5.6*  ALBUMIN 2.9*  AST 15  ALT 11  ALKPHOS 51  BILITOT 0.9   Studies/Results: CT ABDOMEN PELVIS W CONTRAST  Result Date: 06/18/2023 CLINICAL DATA:  Acute abdominal pain.  History of Crohn's disease. EXAM: CT ABDOMEN AND PELVIS WITH CONTRAST TECHNIQUE: Multidetector CT imaging of the abdomen and pelvis was performed using the standard protocol following bolus administration of intravenous contrast. RADIATION DOSE REDUCTION: This exam was performed according to the departmental  dose-optimization program which includes automated exposure control, adjustment of the mA and/or kV according to patient size and/or use of iterative reconstruction technique. CONTRAST:  OMNIPAQUE IOHEXOL 300 MG/ML  SOLN COMPARISON:  CT 07/22/2020 FINDINGS: Lower chest: Subsegmental atelectasis within both lung bases. No significant pleural effusion. Aortic atherosclerosis. Hepatobiliary: Gallbladder is mildly distended. There is intra and extrahepatic biliary ductal dilatation. The common bile duct measures 17 mm at the porta hepatis. No calcified gallstone or visualized choledocholithiasis. No pericholecystic fat stranding. No focal hepatic lesion. Pancreas: Diffuse peripancreatic fat stranding and edema. No evidence of obstructing pancreatic mass. Borderline pancreatic ductal dilatation, 3-4 mm. Homogeneous pancreatic enhancement. No pancreatic necrosis. No acute peripancreatic collection. Spleen: Normal in size without focal abnormality. The splenic vein is patent. Adrenals/Urinary Tract: No adrenal nodule. No hydronephrosis. 3 mm nonobstructing stone in the upper pole of the right kidney. Suspicious renal lesion. Unremarkable urinary bladder. Stomach/Bowel: Small hiatal hernia. The stomach is nondistended. There is a periampullary duodenal diverticulum. Mild wall thickening about the fourth portion of the duodenum. Mild Peri duodenal fat stranding and edema. No other small bowel inflammation. No small bowel obstruction. Normal appendix. No terminal ileal inflammation. Scattered colonic diverticula. The majority of the colon is decompressed which limits assessment for wall thickening. There is no significant pericolonic edema or evidence of colonic inflammation. Vascular/Lymphatic: Aortic atherosclerosis and tortuosity. No aneurysm. The portal and splenic veins are patent. No enlarged lymph nodes in the abdomen  or pelvis. Reproductive: Unchanged 18 mm simple appearing right ovarian cyst series 2, image 61.  Anteverted uterus. No suspicious adnexal mass. Other: Retroperitoneal fat stranding and edema with trace free fluid. No free air or focal fluid collection. No abdominal wall hernia. Musculoskeletal: Degenerative change throughout the spine. There are no acute or suspicious osseous abnormalities. IMPRESSION: 1. Findings consistent with acute pancreatitis. No evidence of pancreatic necrosis or acute peripancreatic collection. 2. Intra and extrahepatic biliary ductal dilatation with common bile duct measuring 17 mm. No calcified gallstone or visualized choledocholithiasis. This may be related to pancreatic inflammation or periampullary duodenal diverticulum. Recommend correlation with LFTs. Consider further evaluation with MRCP. 3. Wall thickening about the duodenum with adjacent fat stranding, felt to be secondary to pancreatic inflammation. Primary and duodenal inflammation is felt less likely. No other evidence of bowel inflammation. 4. Colonic diverticulosis without diverticulitis. 5. Nonobstructing right renal stone. 6. Simple appearing right ovarian cyst measuring 18 mm. This is unchanged from 2021 exam and most likely benign. No follow-up imaging is recommended per consensus guidelines. Aortic Atherosclerosis (ICD10-I70.0). Electronically Signed   By: Narda Rutherford M.D.   On: 06/18/2023 15:09       Assessment / Plan:   Assessment: 1.  Pancreatitis: Admitted with sudden onset epigastric pain, lipase 1600 and LFTs normal, CT scan with acute pancreatitis and intra/extrahepatic biliary ductal dilation, CBD measuring 17 mm, no choledocholithiasis, wall thickening of the duodenum and adjacent fat stranding secondary to pancreatic inflammation, triglyceride levels normal, no alcohol use; consider relation to statin versus idiopathic versus microlithiasis versus other 2.  Crohn's disease: On Entyvio  Plan: 1.  Added ESR and CRP today 2.  Patient was changed to a low-fat diet this morning, if tolerates can  increase as tolerated 3.  Reviewed data on Entyvio, typically when patients develop pancreatitis that is early on after the first couple of administrations, patient has been on this medication for years making this less likely.  Could consider relation to statin medication, again it appears she has been on these for years, so making this less likely as well, but Rosuvastatin or Lovastatin have decreased risk of pancreatitis, may consider switch to those in the future, will leave this decision to primary care team. 4.  Patient will need repeat MRCP in 4 to 6 weeks when acute inflammation is better 5.  Would not stop Entyvio at this point 6.  Patient can likely be discharged in the next 24 hours as long as she continues to improve  Thank you for your kind consultation, we will continue to follow    LOS: 2 days   Unk Lightning  06/20/2023, 9:34 AM

## 2023-06-20 NOTE — Plan of Care (Signed)
  Problem: Education: Goal: Knowledge of General Education information will improve Description: Including pain rating scale, medication(s)/side effects and non-pharmacologic comfort measures Outcome: Progressing   Problem: Health Behavior/Discharge Planning: Goal: Ability to manage health-related needs will improve Outcome: Progressing   Problem: Nutrition: Goal: Adequate nutrition will be maintained Outcome: Progressing   

## 2023-06-20 NOTE — Plan of Care (Signed)
  Problem: Education: Goal: Knowledge of General Education information will improve Description: Including pain rating scale, medication(s)/side effects and non-pharmacologic comfort measures Outcome: Progressing   Problem: Clinical Measurements: Goal: Ability to maintain clinical measurements within normal limits will improve Outcome: Progressing   

## 2023-06-21 ENCOUNTER — Telehealth: Payer: Self-pay

## 2023-06-21 ENCOUNTER — Other Ambulatory Visit: Payer: Self-pay

## 2023-06-21 DIAGNOSIS — K859 Acute pancreatitis without necrosis or infection, unspecified: Secondary | ICD-10-CM | POA: Diagnosis not present

## 2023-06-21 DIAGNOSIS — K8500 Idiopathic acute pancreatitis without necrosis or infection: Secondary | ICD-10-CM

## 2023-06-21 DIAGNOSIS — K838 Other specified diseases of biliary tract: Secondary | ICD-10-CM

## 2023-06-21 LAB — BASIC METABOLIC PANEL
Anion gap: 10 (ref 5–15)
BUN: 11 mg/dL (ref 8–23)
CO2: 20 mmol/L — ABNORMAL LOW (ref 22–32)
Calcium: 8.9 mg/dL (ref 8.9–10.3)
Chloride: 108 mmol/L (ref 98–111)
Creatinine, Ser: 0.46 mg/dL (ref 0.44–1.00)
GFR, Estimated: >60 mL/min (ref >60)
Glucose, Bld: 105 mg/dL — ABNORMAL HIGH (ref 70–99)
Potassium: 3.8 mmol/L (ref 3.5–5.1)
Sodium: 138 mmol/L (ref 135–145)

## 2023-06-21 MED ORDER — LACTATED RINGERS IV SOLN: INTRAVENOUS | Status: DC

## 2023-06-21 NOTE — Progress Notes (Signed)
PT Cancellation Note  Patient Details Name: Jade Hernandez MRN: [id number] DOB: 12/31/1940   Cancelled Treatment:    Reason Eval/Treat Not Completed: PT screened, no needs identified, will sign off Patient ambulatory, no needs. Blanchard Kelch PT Acute Rehabilitation Services Office [phone] Weekend pager-[phone]   [name] 06/21/2023, 12:09 PM

## 2023-06-21 NOTE — Telephone Encounter (Signed)
-----   Message from Stan Head sent at 06/20/2023  3:57 PM EDT ----- Regarding: RE: Follow-up Order it in my name. Let me know the date we get and I may be able to squeeze her in to my schedule ----- Message ----- From: Lemar Lofty., MD Sent: 06/20/2023   3:48 PM EDT To: Iva Boop, MD; Emeline Darling, RN; # Subject: Follow-up                                      Viviann Spare, This is 1 of Dr. Marvell Fuller patients. She needs an MRI/MRCP in 4 to 6 weeks (idiopathic pancreatitis dilated bile duct). She needs a follow-up in clinic with Dr. Leone Payor or one of the APP's within a week of the MRI being completed. I will be available for an endoscopic ultrasound if needed, pending the findings on MRI/MRCP. She will likely be discharged later today versus tomorrow. Thanks. GM

## 2023-06-21 NOTE — Telephone Encounter (Signed)
Pt was scheduled for an MRI/ MRCP on 07/19/2023 at 9:00 AM at [facility]. Pt to arrive at 8:30 AM. Nothing to eat or drink 4 hours prior.  Left message for pt to call back

## 2023-06-21 NOTE — Plan of Care (Signed)
  Problem: Education: Goal: Knowledge of General Education information will improve Description Including pain rating scale, medication(s)/side effects and non-pharmacologic comfort measures Outcome: Progressing   Problem: Health Behavior/Discharge Planning: Goal: Ability to manage health-related needs will improve Outcome: Progressing   

## 2023-06-21 NOTE — Discharge Summary (Signed)
Discharge Summary  Jade Hernandez:[id number] DOB: [birth year]  PCP: [name], MD  Admit date: 06/18/2023 Discharge date: 06/21/2023  Time spent: 35 minutes.  Recommendations for Outpatient Follow-up:  Follow-up with your gastroenterologist, please keep your scheduled appointment. Follow-up with your cardiologist, please keep your scheduled appointment.  Discharge Diagnoses:  Active Hospital Problems   Diagnosis Date Noted   Pancreatitis 06/18/2023   Dilated bile duct 06/20/2023   Duodenitis 06/18/2023   Long-term use of immunosuppressant medication Thompson Grayer 02/16/2021   Crohn's disease of both small and large intestine with other complication (HCC) 10/23/2020   Hyperlipidemia 01/11/2014   GERD (gastroesophageal reflux disease) 01/11/2014   Hypertension     Resolved Hospital Problems  No resolved problems to display.    Discharge Condition: Stable  Diet recommendation: Low-fat diet.  Vitals:   06/21/23 0613 06/21/23 1226  BP: (!) 140/81 135/79  Pulse: 75 70  Resp: 14 14  Temp: 98.4 F (36.9 C) (!) 97.5 F (36.4 C)  SpO2: 98% 100%    History of present illness:  Jade Hernandez is an 82 y.o. female with past medical history Crohn's disease on Entyvio, essential hypertension.  She initially seen at urgent care then sent to the [facility] emergency department with epigastric/abdominal pain x 1 day, noted as severe, described as diffuse across upper abdomen.  History of Crohn's disease, but this does not feel similar to previous Crohn's flares.  CT abdomen/pelvis with contrast was consistent with acute pancreatitis, no pancreatic necrosis/abscess, intra and extrahepatic biliary ductal dilatation but no obvious gallstone seen, possible sequelae of pancreatic inflammation with wall thickening around duodenum, this also may be contributing to biliary ductal dilatation.  Was seen by GI.  Plan is to obtain an MRCP in a few weeks outpatient once her acute illness has  resolved.  Her lipase level downtrending.  The patient was able to tolerate a soft low-fat diet without nausea or significant abdominal pain.   06/21/23: The patient was seen at her bedside.  There were no acute events overnight.  She has no new complaints.  She is tolerating a soft low-fat diet.  No nausea or abdominal pain reported.  She is eager to go home.  Hospital Course:  Principal Problem:   Pancreatitis Active Problems:   Hypertension   Hyperlipidemia   GERD (gastroesophageal reflux disease)   Crohn's disease of both small and large intestine with other complication (HCC)   Long-term use of immunosuppressant medication _ Entyvio   Duodenitis   Dilated bile duct  Acute Pancreatitis, improving clinically Lipase level downtrending. Tolerating a soft low-fat diet. An appointment has been made for you to the office of Dr. Leone Payor as stated below. Follow-up with GI for MRCP.  Hyperlipidemia Home statin held due to possible cause of her acute pancreatitis An appointment has been made for you to the office of Dr. Tresa Endo as stated below. Defer further management to cardiology.   Resolved hypokalemia, post repletion.   Resolved isolated elevated T. bili   History of Crohn's disease Follow-up with GI for further management.  Obesity BMI 30 Recommend weight loss outpatient with regular physical activity and healthy diet.    Hypertension BPs are stable Continue your oral antihypertensives Follow-up with your PCP   Procedures: None.  Consultations: GI.  Discharge Exam: BP 135/79 (BP Location: Left Arm)   Pulse 70   Temp (!) 97.5 F (36.4 C) (Oral)   Resp 14   Ht 5\' 3"  (1.6 m)   Wt 77.7  kg   SpO2 100%   BMI 30.34 kg/m  General: 82 y.o. year-old female well developed well nourished in no acute distress.  Alert and oriented x3. Cardiovascular: Regular rate and rhythm with no rubs or gallops.  No thyromegaly or JVD noted.   Respiratory: Clear to auscultation with  no wheezes or rales. Good inspiratory effort. Abdomen: Soft nontender nondistended with normal bowel sounds x4 quadrants. Musculoskeletal: No lower extremity edema. 2/4 pulses in all 4 extremities. Skin: No ulcerative lesions noted or rashes, Psychiatry: Mood is appropriate for condition and setting  Discharge Instructions You were cared for by a hospitalist during your hospital stay. If you have any questions about your discharge medications or the care you received while you were in the hospital after you are discharged, you can call the unit and asked to speak with the hospitalist on call if the hospitalist that took care of you is not available. Once you are discharged, your primary care physician will handle any further medical issues. Please note that NO REFILLS for any discharge medications will be authorized once you are discharged, as it is imperative that you return to your primary care physician (or establish a relationship with a primary care physician if you do not have one) for your aftercare needs so that they can reassess your need for medications and monitor your lab values.   Allergies as of 06/21/2023       Reactions   Feraheme [ferumoxytol] Shortness Of Breath, Palpitations   Severe pain all over        Medication List     STOP taking these medications    simvastatin 40 MG tablet Commonly known as: ZOCOR       TAKE these medications    acetaminophen 500 MG tablet Commonly known as: TYLENOL Take 1,000 mg by mouth in the morning and at bedtime.   amLODipine 2.5 MG tablet Commonly known as: NORVASC Take 1 tablet (2.5 mg total) by mouth daily.   cholecalciferol 25 MCG (1000 UNIT) tablet Commonly known as: VITAMIN D3 Take 1,000 Units by mouth daily.   metoprolol succinate 100 MG 24 hr tablet Commonly known as: TOPROL-XL TAKE 1 TABLET(100 MG) BY MOUTH DAILY WITH OR IMMEDIATELY FOLLOWING A MEAL What changed: See the new instructions.   PROBIOTIC DAILY  PO Take 1 capsule by mouth in the morning.   valsartan 160 MG tablet Commonly known as: DIOVAN Take 1 tablet (160 mg total) by mouth daily.       Allergies  Allergen Reactions   Feraheme [Ferumoxytol] Shortness Of Breath and Palpitations    Severe pain all over    Follow-up Information     [name], MD. Call today.   Specialty: Family Medicine Why: Please call for a posthospital follow-up appointment. Contact information: [address] [place] [address] [phone]         Lennette Bihari, MD. Go to.   Specialty: Cardiology Why: Please go to your appointment with Consuella Lose 07/26/23 at 3:10 PM.  You have seen Stanton Kidney before and she is Dr. Landry Dyke Physician Assistant. Contact information: [address] [place] [address] [phone]         Iva Boop, MD. Go to.   Specialty: Gastroenterology Why: Please go to your appointment with Cira Servant 08/05/23 at 2:30 PM.  Ms. Cira Servant is Dr. Marvell Fuller Physician Assistant. Contact information: [address] [place] [address] [phone]  The results of significant diagnostics from this hospitalization (including imaging, microbiology, ancillary and laboratory) are listed below for reference.    Significant Diagnostic Studies: CT ABDOMEN PELVIS W CONTRAST  Result Date: 06/18/2023 CLINICAL DATA:  Acute abdominal pain.  History of Crohn's disease. EXAM: CT ABDOMEN AND PELVIS WITH CONTRAST TECHNIQUE: Multidetector CT imaging of the abdomen and pelvis was performed using the standard protocol following bolus administration of intravenous contrast. RADIATION DOSE REDUCTION: This exam was performed according to the departmental dose-optimization program which includes automated exposure control, adjustment of the mA and/or kV according to patient size and/or use of iterative reconstruction technique. CONTRAST:  OMNIPAQUE  IOHEXOL 300 MG/ML  SOLN COMPARISON:  CT 07/22/2020 FINDINGS: Lower chest: Subsegmental atelectasis within both lung bases. No significant pleural effusion. Aortic atherosclerosis. Hepatobiliary: Gallbladder is mildly distended. There is intra and extrahepatic biliary ductal dilatation. The common bile duct measures 17 mm at the porta hepatis. No calcified gallstone or visualized choledocholithiasis. No pericholecystic fat stranding. No focal hepatic lesion. Pancreas: Diffuse peripancreatic fat stranding and edema. No evidence of obstructing pancreatic mass. Borderline pancreatic ductal dilatation, 3-4 mm. Homogeneous pancreatic enhancement. No pancreatic necrosis. No acute peripancreatic collection. Spleen: Normal in size without focal abnormality. The splenic vein is patent. Adrenals/Urinary Tract: No adrenal nodule. No hydronephrosis. 3 mm nonobstructing stone in the upper pole of the right kidney. Suspicious renal lesion. Unremarkable urinary bladder. Stomach/Bowel: Small hiatal hernia. The stomach is nondistended. There is a periampullary duodenal diverticulum. Mild wall thickening about the fourth portion of the duodenum. Mild Peri duodenal fat stranding and edema. No other small bowel inflammation. No small bowel obstruction. Normal appendix. No terminal ileal inflammation. Scattered colonic diverticula. The majority of the colon is decompressed which limits assessment for wall thickening. There is no significant pericolonic edema or evidence of colonic inflammation. Vascular/Lymphatic: Aortic atherosclerosis and tortuosity. No aneurysm. The portal and splenic veins are patent. No enlarged lymph nodes in the abdomen or pelvis. Reproductive: Unchanged 18 mm simple appearing right ovarian cyst series 2, image 61. Anteverted uterus. No suspicious adnexal mass. Other: Retroperitoneal fat stranding and edema with trace free fluid. No free air or focal fluid collection. No abdominal wall hernia. Musculoskeletal:  Degenerative change throughout the spine. There are no acute or suspicious osseous abnormalities. IMPRESSION: 1. Findings consistent with acute pancreatitis. No evidence of pancreatic necrosis or acute peripancreatic collection. 2. Intra and extrahepatic biliary ductal dilatation with common bile duct measuring 17 mm. No calcified gallstone or visualized choledocholithiasis. This may be related to pancreatic inflammation or periampullary duodenal diverticulum. Recommend correlation with LFTs. Consider further evaluation with MRCP. 3. Wall thickening about the duodenum with adjacent fat stranding, felt to be secondary to pancreatic inflammation. Primary and duodenal inflammation is felt less likely. No other evidence of bowel inflammation. 4. Colonic diverticulosis without diverticulitis. 5. Nonobstructing right renal stone. 6. Simple appearing right ovarian cyst measuring 18 mm. This is unchanged from 2021 exam and most likely benign. No follow-up imaging is recommended per consensus guidelines. Aortic Atherosclerosis (ICD10-I70.0). Electronically Signed   By: Narda Rutherford M.D.   On: 06/18/2023 15:09    Microbiology: No results found for this or any previous visit (from the past 240 hour(s)).   Labs: Basic Metabolic Panel: Recent Labs  Lab 06/18/23 1351 06/18/23 2127 06/19/23 0541 06/20/23 0615 06/21/23 0457  NA 139 138 137 138 138  K 3.9 4.3 3.3* 3.6 3.8  CL 105 106 108 110 108  CO2 25 24 23  21* 20*  GLUCOSE  105* 113* 98 101* 105*  BUN 16 17 17  7* 11  CREATININE 0.49 0.51 0.47 0.46 0.46  CALCIUM 9.5 8.9 8.3* 8.5* 8.9  MG  --  2.0 2.0  --   --   PHOS  --  3.7 2.8  --   --    Liver Function Tests: Recent Labs  Lab 06/18/23 1351 06/19/23 0541 06/20/23 0615  AST 27 16 15   ALT 13 13 11   ALKPHOS 68 50 51  BILITOT 0.8 1.3* 0.9  PROT 6.7 5.4* 5.6*  ALBUMIN 4.0 2.6* 2.9*   Recent Labs  Lab 06/18/23 1351 06/19/23 0541 06/20/23 0615 06/21/23 0457  LIPASE 1,608* 377* 399* 329*    No results for input(s): "AMMONIA" in the last 168 hours. CBC: Recent Labs  Lab 06/18/23 1351 06/19/23 0541 06/20/23 0615  WBC 9.4 7.2 4.7  NEUTROABS 8.3*  --   --   HGB 13.0 10.2* 10.5*  HCT 41.4 33.5* 33.6*  MCV 78.7* 82.9 80.6  PLT 151 181 179   Cardiac Enzymes: Recent Labs  Lab 06/18/23 2127  CKTOTAL 27*   BNP: BNP (last 3 results) No results for input(s): "BNP" in the last 8760 hours.  ProBNP (last 3 results) No results for input(s): "PROBNP" in the last 8760 hours.  CBG: No results for input(s): "GLUCAP" in the last 168 hours.     Signed:  Darlin Drop, MD [facility] 06/21/2023, 4:18 PM

## 2023-06-21 NOTE — Progress Notes (Signed)
   06/21/23 0945  TOC Brief Assessment  Insurance and Status Reviewed  Patient has primary care physician Yes  Home environment has been reviewed Resides with spouse and children  Prior level of function: Indpendent at baseline  Prior/Current Home Services No current home services  Social Determinants of Health Reivew SDOH reviewed no interventions necessary  Readmission risk has been reviewed Yes  Transition of care needs no transition of care needs at this time

## 2023-06-21 NOTE — Progress Notes (Signed)
OT Cancellation Note  Patient Details Name: ZARYIA PETITHOMME MRN: [id number] DOB: 04-25-1941   Cancelled Treatment:    Reason Eval/Treat Not Completed: OT screened, no needs identified, will sign off Patient reported being able to complete all ADLs in room herself. OT to sign off at this time.  Rosalio Loud, MS Acute Rehabilitation Department Office# [phone]  06/21/2023, 10:55 AM

## 2023-06-22 ENCOUNTER — Inpatient Hospital Stay (HOSPITAL_BASED_OUTPATIENT_CLINIC_OR_DEPARTMENT_OTHER)
Admission: EM | Admit: 2023-06-22 | Discharge: 2023-06-26 | DRG: 444 | Disposition: A | Discharge status: Home / Self Care | Payer: Medicare Other | Attending: Family Medicine | Admitting: Family Medicine

## 2023-06-22 ENCOUNTER — Encounter (HOSPITAL_BASED_OUTPATIENT_CLINIC_OR_DEPARTMENT_OTHER): Payer: Self-pay

## 2023-06-22 DIAGNOSIS — N281 Cyst of kidney, acquired: Secondary | ICD-10-CM | POA: Diagnosis not present

## 2023-06-22 DIAGNOSIS — E785 Hyperlipidemia, unspecified: Secondary | ICD-10-CM | POA: Diagnosis present

## 2023-06-22 DIAGNOSIS — D500 Iron deficiency anemia secondary to blood loss (chronic): Secondary | ICD-10-CM | POA: Diagnosis present

## 2023-06-22 DIAGNOSIS — Z79899 Other long term (current) drug therapy: Secondary | ICD-10-CM | POA: Diagnosis not present

## 2023-06-22 DIAGNOSIS — K838 Other specified diseases of biliary tract: Secondary | ICD-10-CM | POA: Diagnosis not present

## 2023-06-22 DIAGNOSIS — E7800 Pure hypercholesterolemia, unspecified: Secondary | ICD-10-CM | POA: Diagnosis present

## 2023-06-22 DIAGNOSIS — Z8249 Family history of ischemic heart disease and other diseases of the circulatory system: Secondary | ICD-10-CM

## 2023-06-22 DIAGNOSIS — Z96653 Presence of artificial knee joint, bilateral: Secondary | ICD-10-CM | POA: Diagnosis present

## 2023-06-22 DIAGNOSIS — K449 Diaphragmatic hernia without obstruction or gangrene: Secondary | ICD-10-CM | POA: Diagnosis present

## 2023-06-22 DIAGNOSIS — Z833 Family history of diabetes mellitus: Secondary | ICD-10-CM | POA: Diagnosis not present

## 2023-06-22 DIAGNOSIS — R7303 Prediabetes: Secondary | ICD-10-CM | POA: Diagnosis present

## 2023-06-22 DIAGNOSIS — K50818 Crohn's disease of both small and large intestine with other complication: Secondary | ICD-10-CM | POA: Diagnosis not present

## 2023-06-22 DIAGNOSIS — I10 Essential (primary) hypertension: Secondary | ICD-10-CM | POA: Diagnosis present

## 2023-06-22 DIAGNOSIS — K851 Biliary acute pancreatitis without necrosis or infection: Secondary | ICD-10-CM | POA: Diagnosis present

## 2023-06-22 DIAGNOSIS — K5710 Diverticulosis of small intestine without perforation or abscess without bleeding: Secondary | ICD-10-CM | POA: Diagnosis not present

## 2023-06-22 DIAGNOSIS — M179 Osteoarthritis of knee, unspecified: Secondary | ICD-10-CM | POA: Diagnosis present

## 2023-06-22 DIAGNOSIS — E876 Hypokalemia: Secondary | ICD-10-CM | POA: Diagnosis present

## 2023-06-22 DIAGNOSIS — K219 Gastro-esophageal reflux disease without esophagitis: Secondary | ICD-10-CM | POA: Diagnosis present

## 2023-06-22 DIAGNOSIS — K859 Acute pancreatitis without necrosis or infection, unspecified: Secondary | ICD-10-CM | POA: Diagnosis not present

## 2023-06-22 DIAGNOSIS — Z888 Allergy status to other drugs, medicaments and biological substances status: Secondary | ICD-10-CM | POA: Diagnosis not present

## 2023-06-22 DIAGNOSIS — K5090 Crohn's disease, unspecified, without complications: Secondary | ICD-10-CM | POA: Diagnosis not present

## 2023-06-22 DIAGNOSIS — K8500 Idiopathic acute pancreatitis without necrosis or infection: Secondary | ICD-10-CM | POA: Diagnosis not present

## 2023-06-22 DIAGNOSIS — K8050 Calculus of bile duct without cholangitis or cholecystitis without obstruction: Secondary | ICD-10-CM | POA: Diagnosis not present

## 2023-06-22 DIAGNOSIS — K5080 Crohn's disease of both small and large intestine without complications: Secondary | ICD-10-CM | POA: Diagnosis not present

## 2023-06-22 LAB — CBC
HCT: 38.1 % (ref 36.0–46.0)
Hemoglobin: 11.9 g/dL — ABNORMAL LOW (ref 12.0–15.0)
MCH: 24.4 pg — ABNORMAL LOW (ref 26.0–34.0)
MCHC: 31.2 g/dL (ref 30.0–36.0)
MCV: 78.2 fL — ABNORMAL LOW (ref 80.0–100.0)
Platelets: 262 10*3/uL (ref 150–400)
RBC: 4.87 MIL/uL (ref 3.87–5.11)
RDW: 15 % (ref 11.5–15.5)
WBC: 9.5 10*3/uL (ref 4.0–10.5)
nRBC: 0 % (ref 0.0–0.2)

## 2023-06-22 MED ORDER — ONDANSETRON 4 MG PO TBDP
4.0000 mg | ORAL_TABLET | Freq: Once | ORAL | Status: AC | PRN
  Administered 2023-06-22: 4 mg via ORAL
  Filled 2023-06-22: qty 1

## 2023-06-22 NOTE — Telephone Encounter (Signed)
Pt was made aware of Dr. Leone Payor recommendations: Pt was scheduled for an MRI/ MRCP on 07/19/2023 at 9:00 AM at [facility]. Pt to arrive at 8:30 AM. Nothing to eat or drink 4 hours prior.  Pt verbalized understanding with all questions answered.

## 2023-06-22 NOTE — ED Triage Notes (Signed)
Pt arrived POV with family after being d/c from [facility] for pancreatitis. Pt was felling well then started having abd pain and n/v after eating tonight. VSS, A&O x4.

## 2023-06-22 NOTE — Telephone Encounter (Signed)
Please add her on to see me 9/5 at 350 PM  Dx pancreatitis

## 2023-06-22 NOTE — Telephone Encounter (Signed)
Pt made aware of Dr. Leone Payor recommendations: Pt was scheduled to see Dr. Leone Payor on 07/21/2023 at 3:50 PM. Pt made aware. Pt verbalized understanding with all questions answered.

## 2023-06-23 ENCOUNTER — Inpatient Hospital Stay (HOSPITAL_COMMUNITY): Payer: Medicare Other

## 2023-06-23 ENCOUNTER — Other Ambulatory Visit: Payer: Self-pay

## 2023-06-23 DIAGNOSIS — K851 Biliary acute pancreatitis without necrosis or infection: Secondary | ICD-10-CM | POA: Diagnosis present

## 2023-06-23 DIAGNOSIS — K5090 Crohn's disease, unspecified, without complications: Secondary | ICD-10-CM

## 2023-06-23 DIAGNOSIS — R7303 Prediabetes: Secondary | ICD-10-CM | POA: Diagnosis present

## 2023-06-23 DIAGNOSIS — K859 Acute pancreatitis without necrosis or infection, unspecified: Secondary | ICD-10-CM | POA: Diagnosis present

## 2023-06-23 DIAGNOSIS — Z833 Family history of diabetes mellitus: Secondary | ICD-10-CM | POA: Diagnosis not present

## 2023-06-23 DIAGNOSIS — K219 Gastro-esophageal reflux disease without esophagitis: Secondary | ICD-10-CM | POA: Diagnosis present

## 2023-06-23 DIAGNOSIS — K50818 Crohn's disease of both small and large intestine with other complication: Secondary | ICD-10-CM | POA: Diagnosis present

## 2023-06-23 DIAGNOSIS — K5710 Diverticulosis of small intestine without perforation or abscess without bleeding: Secondary | ICD-10-CM | POA: Diagnosis not present

## 2023-06-23 DIAGNOSIS — Z79899 Other long term (current) drug therapy: Secondary | ICD-10-CM | POA: Diagnosis not present

## 2023-06-23 DIAGNOSIS — I10 Essential (primary) hypertension: Secondary | ICD-10-CM | POA: Diagnosis present

## 2023-06-23 DIAGNOSIS — Z888 Allergy status to other drugs, medicaments and biological substances status: Secondary | ICD-10-CM | POA: Diagnosis not present

## 2023-06-23 DIAGNOSIS — Z8249 Family history of ischemic heart disease and other diseases of the circulatory system: Secondary | ICD-10-CM | POA: Diagnosis not present

## 2023-06-23 DIAGNOSIS — E7800 Pure hypercholesterolemia, unspecified: Secondary | ICD-10-CM | POA: Diagnosis present

## 2023-06-23 DIAGNOSIS — K838 Other specified diseases of biliary tract: Secondary | ICD-10-CM | POA: Diagnosis not present

## 2023-06-23 DIAGNOSIS — E876 Hypokalemia: Secondary | ICD-10-CM | POA: Diagnosis present

## 2023-06-23 DIAGNOSIS — K5080 Crohn's disease of both small and large intestine without complications: Secondary | ICD-10-CM | POA: Diagnosis not present

## 2023-06-23 DIAGNOSIS — K8500 Idiopathic acute pancreatitis without necrosis or infection: Secondary | ICD-10-CM | POA: Diagnosis not present

## 2023-06-23 DIAGNOSIS — K8050 Calculus of bile duct without cholangitis or cholecystitis without obstruction: Secondary | ICD-10-CM | POA: Diagnosis present

## 2023-06-23 DIAGNOSIS — K449 Diaphragmatic hernia without obstruction or gangrene: Secondary | ICD-10-CM | POA: Diagnosis present

## 2023-06-23 DIAGNOSIS — D500 Iron deficiency anemia secondary to blood loss (chronic): Secondary | ICD-10-CM | POA: Diagnosis present

## 2023-06-23 DIAGNOSIS — Z96653 Presence of artificial knee joint, bilateral: Secondary | ICD-10-CM | POA: Diagnosis present

## 2023-06-23 DIAGNOSIS — N281 Cyst of kidney, acquired: Secondary | ICD-10-CM | POA: Diagnosis not present

## 2023-06-23 HISTORY — DX: Biliary acute pancreatitis without necrosis or infection: K85.10

## 2023-06-23 LAB — CBC
HCT: 38.5 % (ref 36.0–46.0)
Hemoglobin: 11.8 g/dL — ABNORMAL LOW (ref 12.0–15.0)
MCH: 24.3 pg — ABNORMAL LOW (ref 26.0–34.0)
MCHC: 30.6 g/dL (ref 30.0–36.0)
MCV: 79.2 fL — ABNORMAL LOW (ref 80.0–100.0)
Platelets: 222 10*3/uL (ref 150–400)
RBC: 4.86 MIL/uL (ref 3.87–5.11)
RDW: 15.1 % (ref 11.5–15.5)
WBC: 9.1 10*3/uL (ref 4.0–10.5)
nRBC: 0 % (ref 0.0–0.2)

## 2023-06-23 LAB — CREATININE, SERUM
Creatinine, Ser: 0.47 mg/dL (ref 0.44–1.00)
GFR, Estimated: >60 mL/min (ref >60)

## 2023-06-23 MED ORDER — ACETAMINOPHEN 325 MG PO TABS
650.0000 mg | ORAL_TABLET | Freq: Once | ORAL | Status: AC
  Administered 2023-06-23: 650 mg via ORAL
  Filled 2023-06-23: qty 2

## 2023-06-23 MED ORDER — IRBESARTAN 300 MG PO TABS
150.0000 mg | ORAL_TABLET | Freq: Every day | ORAL | Status: DC
  Administered 2023-06-23 – 2023-06-26 (×4): 150 mg via ORAL
  Filled 2023-06-23 (×4): qty 1

## 2023-06-23 MED ORDER — SENNA 8.6 MG PO TABS
1.0000 | ORAL_TABLET | Freq: Two times a day (BID) | ORAL | Status: DC
  Administered 2023-06-23 – 2023-06-25 (×5): 8.6 mg via ORAL
  Filled 2023-06-23 (×7): qty 1

## 2023-06-23 MED ORDER — AMLODIPINE BESYLATE 5 MG PO TABS
2.5000 mg | ORAL_TABLET | Freq: Every day | ORAL | Status: DC
  Administered 2023-06-23 – 2023-06-26 (×4): 2.5 mg via ORAL
  Filled 2023-06-23 (×4): qty 1

## 2023-06-23 MED ORDER — ACETAMINOPHEN 650 MG RE SUPP: 650.0000 mg | Freq: Four times a day (QID) | RECTAL | Status: DC | PRN

## 2023-06-23 MED ORDER — ONDANSETRON HCL 4 MG PO TABS: 4.0000 mg | ORAL_TABLET | Freq: Four times a day (QID) | ORAL | Status: DC | PRN

## 2023-06-23 MED ORDER — DOCUSATE SODIUM 100 MG PO CAPS
100.0000 mg | ORAL_CAPSULE | Freq: Two times a day (BID) | ORAL | Status: DC
  Administered 2023-06-23 – 2023-06-25 (×5): 100 mg via ORAL
  Filled 2023-06-23 (×7): qty 1

## 2023-06-23 MED ORDER — ONDANSETRON HCL 4 MG/2ML IJ SOLN: 4.0000 mg | Freq: Four times a day (QID) | INTRAMUSCULAR | Status: DC | PRN

## 2023-06-23 MED ORDER — ONDANSETRON HCL 4 MG/2ML IJ SOLN
4.0000 mg | Freq: Four times a day (QID) | INTRAMUSCULAR | Status: DC | PRN
  Administered 2023-06-23: 4 mg via INTRAVENOUS
  Filled 2023-06-23: qty 2

## 2023-06-23 MED ORDER — FENTANYL CITRATE PF 50 MCG/ML IJ SOSY
50.0000 ug | PREFILLED_SYRINGE | INTRAMUSCULAR | Status: DC | PRN
  Administered 2023-06-23 (×2): 50 ug via INTRAVENOUS
  Filled 2023-06-23 (×2): qty 1

## 2023-06-23 MED ORDER — LACTATED RINGERS IV SOLN: INTRAVENOUS | Status: DC

## 2023-06-23 MED ORDER — SODIUM CHLORIDE 0.9 % IV SOLN: INTRAVENOUS | Status: DC

## 2023-06-23 MED ORDER — ACETAMINOPHEN 325 MG PO TABS: 650.0000 mg | ORAL_TABLET | Freq: Four times a day (QID) | ORAL | Status: DC | PRN

## 2023-06-23 MED ORDER — HYDROMORPHONE HCL 1 MG/ML IJ SOLN: 0.5000 mg | INTRAMUSCULAR | Status: DC | PRN

## 2023-06-23 MED ORDER — METOPROLOL SUCCINATE ER 50 MG PO TB24
100.0000 mg | ORAL_TABLET | Freq: Every day | ORAL | Status: DC
  Administered 2023-06-23 – 2023-06-26 (×3): 100 mg via ORAL
  Filled 2023-06-23 (×3): qty 2

## 2023-06-23 MED ORDER — LIDOCAINE 5 % EX PTCH
1.0000 | MEDICATED_PATCH | CUTANEOUS | Status: DC
  Administered 2023-06-23 – 2023-06-26 (×4): 1 via TRANSDERMAL
  Filled 2023-06-23 (×5): qty 1

## 2023-06-23 MED ORDER — ENOXAPARIN SODIUM 40 MG/0.4ML IJ SOSY
40.0000 mg | PREFILLED_SYRINGE | INTRAMUSCULAR | Status: DC
  Administered 2023-06-23: 40 mg via SUBCUTANEOUS
  Filled 2023-06-23: qty 0.4

## 2023-06-23 MED ORDER — GADOBUTROL 1 MMOL/ML IV SOLN
7.5000 mL | Freq: Once | INTRAVENOUS | Status: AC | PRN
  Administered 2023-06-23: 7.5 mL via INTRAVENOUS

## 2023-06-23 NOTE — Progress Notes (Signed)
   MRCP shows choledocholithiasis  Will need ERCP when clinical status allows  I have ordered clear liquid diet for now then NPO after MN  Will check in AM and see if she would be ready for ERCP tomorrow vs waiting  Spoke to son Ree Kida about this - he will relay to mom  Iva Boop, MD, [facility] [place] [facility] See Loretha Stapler on call - gastroenterology for best contact person 06/23/2023 5:50 PM

## 2023-06-23 NOTE — ED Notes (Addendum)
Attempted to give report x2 to [facility]. No answer

## 2023-06-23 NOTE — H&P (Addendum)
History and Physical    Jade Hernandez AOZ:[id number] DOB: [birth year] DOA: 06/22/2023  PCP: [name], MD   Patient coming from: Home  I have personally briefly reviewed patient's old medical records in [facility]  Chief Complaint: Abdominal pain radiating towards the back x 3 days.Marland Kitchen  HPI: Jade Hernandez is a 82 y.o. female with PMH significant for Crohn's disease, hypertension,  Hyperlipidemia,  irritable bowel syndrome presented in the ED with complaints of abdominal pain radiating towards the back.  Patient was recently discharged from [facility] after being treated for acute pancreatitis.  Symptoms quickly resolved, She was started on solids and was discharged on 06/21/2023 with plan for outpatient MRCP in 4 to 6 weeks.  Patient reports after she went home,  She continued to have epigastric pain on/off  and pain gotten worse in the morning today and she started throwing up.  She went to med [facility] and was advised to get admitted and have MRCP.  ED Course: She was hemodynamically stable. Temp 98.8, HR 81, RR 18, BP 132/69, SpO2 96% on room air Labs include sodium 139, potassium 3.7, chloride 107, bicarb 23, glucose 141, BUN 18, creatinine 0.57, calcium 9.8, anion gap 9, [place] 397, albumin 3.9, lipase 8565, AST 88, ALT 30, total protein of 6.7, total bilirubin 1.1, WBC 9.5, hemoglobin 11.9, hematocrit 31.8, platelet 262. CT abdomen and pelvis 06/19/23: Findings consistent with acute pancreatitis. No evidence of pancreatic necrosis or acute peripancreatic collection. Intra and extrahepatic biliary ductal dilatation with common bile duct measuring 17 mm. No calcified gallstone or visualized choledocholithiasis.  Review of Systems:    Review of Systems  Constitutional: Negative.   HENT: Negative.    Eyes: Negative.   Respiratory: Negative.    Cardiovascular: Negative.   Gastrointestinal:  Positive for abdominal pain, nausea and vomiting.  Genitourinary:  Negative.   Musculoskeletal: Negative.   Skin: Negative.   Neurological: Negative.   Endo/Heme/Allergies: Negative.   Psychiatric/Behavioral: Negative.      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     reports that she has never smoked. She has never used smokeless tobacco. She reports current alcohol use of about 1.0 standard drink of alcohol per week. She reports that she does not use drugs.  Allergies  Allergen Reactions   Feraheme [Ferumoxytol] Shortness Of Breath and Palpitations    Severe pain all over    Family  History  Problem Relation Age of Onset   Hypertension Mother    Diabetes Mother    Heart disease Father        died from heart attack   Heart disease Brother    Colon cancer Neg Hx    Rectal cancer Neg Hx    Liver cancer Neg Hx    Stomach cancer Neg Hx     Family history reviewed and not pertinent .  Prior to Admission medications   Medication Sig Start Date End  Date Taking? Authorizing Provider  acetaminophen (TYLENOL) 500 MG tablet Take 1,000 mg by mouth in the morning and at bedtime.   Yes [provider]  amLODipine (NORVASC) 2.5 MG tablet Take 1 tablet (2.5 mg total) by mouth daily. 02/17/23  Yes Monge, Petra Kuba, NP  cholecalciferol (VITAMIN D3) 25 MCG (1000 UNIT) tablet Take 1,000 Units by mouth daily.   Yes [provider]  metoprolol succinate (TOPROL-XL) 100 MG 24 hr tablet TAKE 1 TABLET(100 MG) BY MOUTH DAILY WITH OR IMMEDIATELY FOLLOWING A MEAL Patient taking differently: Take 100 mg by mouth daily. 05/16/23  Yes Lennette Bihari, MD  Probiotic Product (PROBIOTIC DAILY PO) Take 1 capsule by mouth in the morning.   Yes [provider]  valsartan (DIOVAN) 160 MG tablet Take 1 tablet (160 mg total) by mouth daily. 12/28/22  Yes Lennette Bihari, MD    Physical Exam: Vitals:   06/23/23 0858 06/23/23 0858 06/23/23 0945 06/23/23 1057  BP: (!) 144/80 (!) 144/80  132/69  Pulse: 96 85 83 81  Resp: 20   18  Temp:  99.5 F (37.5 C)  98.8 F (37.1 C)  TempSrc:  Oral  Oral  SpO2: 99% 96% 94% 96%  Weight:      Height:        Constitutional: Appears comfortable, not in any acute distress. Vitals:   06/23/23 0858 06/23/23 0858 06/23/23 0945 06/23/23 1057  BP: (!) 144/80 (!) 144/80  132/69  Pulse: 96 85 83 81  Resp: 20   18  Temp:  99.5 F (37.5 C)  98.8 F (37.1 C)  TempSrc:  Oral  Oral  SpO2: 99% 96% 94% 96%  Weight:      Height:       Eyes: PERRL, lids and conjunctivae normal ENMT: Mucous membranes are moist. Posterior pharynx clear of any exudate or lesions. Neck: normal, supple, no masses, no thyromegaly Respiratory: CTA bilaterally, no wheezing, no crackles. Normal respiratory effort. No accessory muscle use.  Cardiovascular: S1-S2 heard, regular rate and rhythm, no murmurs / rubs / gallops.  Abdomen: Abdomen is soft,  epigastric tenderness noted, non distended, bowel sounds present Musculoskeletal: no clubbing  / cyanosis.  Good ROM, no contractures. Normal muscle tone.  Skin: no rashes, lesions, ulcers. No induration Neurologic: CN 2-12 grossly intact. Sensation intact, DTR normal. Strength 5/5 in all 4.  Psychiatric: Normal judgment and insight. Alert and oriented x 3. Normal mood.   Labs on Admission: I have personally reviewed following labs and imaging studies  CBC: Recent Labs  Lab 06/18/23 1351 06/19/23 0541 06/20/23 0615 06/22/23 2338  WBC 9.4 7.2 4.7 9.5  NEUTROABS 8.3*  --   --   --   HGB 13.0 10.2* 10.5* 11.9*  HCT 41.4 33.5* 33.6* 38.1  MCV 78.7* 82.9 80.6 78.2*  PLT 151 181 179 262   Basic Metabolic Panel: Recent Labs  Lab 06/18/23 2127 06/19/23 0541 06/20/23 0615 06/21/23 0457 06/22/23 2338  NA  138 137 138 138 139  K 4.3 3.3* 3.6 3.8 3.7  CL 106 108 110 108 107  CO2 24 23 21* 20* 23  GLUCOSE 113* 98 101* 105* 141*  BUN 17 17 7* 11 18  CREATININE 0.51 0.47 0.46 0.46 0.57  CALCIUM 8.9 8.3* 8.5* 8.9 9.8  MG 2.0 2.0  --   --   --   PHOS 3.7 2.8  --   --   --    GFR: Estimated Creatinine Clearance: 55.3 mL/min (by C-G formula based on SCr of 0.57 mg/dL). Liver Function Tests: Recent Labs  Lab 06/18/23 1351 06/19/23 0541 06/20/23 0615 06/22/23 2338  AST 27 16 15  88*  ALT 13 13 11 30   ALKPHOS 68 50 51 97  BILITOT 0.8 1.3* 0.9 1.1  PROT 6.7 5.4* 5.6* 6.7  ALBUMIN 4.0 2.6* 2.9* 3.9   Recent Labs  Lab 06/18/23 1351 06/19/23 0541 06/20/23 0615 06/21/23 0457 06/22/23 2338  LIPASE 1,608* 377* 399* 329* 8,565*   No results for input(s): "AMMONIA" in the last 168 hours. Coagulation Profile: No results for input(s): "INR", "PROTIME" in the last 168 hours. Cardiac Enzymes: Recent Labs  Lab 06/18/23 2127  CKTOTAL 27*   BNP (last 3 results) No results for input(s): "PROBNP" in the last 8760 hours. HbA1C: No results for input(s): "HGBA1C" in the last 72 hours. CBG: No results for input(s): "GLUCAP" in the last 168 hours. Lipid Profile: No results  for input(s): "CHOL", "HDL", "LDLCALC", "TRIG", "CHOLHDL", "LDLDIRECT" in the last 72 hours. Thyroid Function Tests: No results for input(s): "TSH", "T4TOTAL", "FREET4", "T3FREE", "THYROIDAB" in the last 72 hours. Anemia Panel: No results for input(s): "VITAMINB12", "FOLATE", "FERRITIN", "TIBC", "IRON", "RETICCTPCT" in the last 72 hours. Urine analysis: No results found for: "COLORURINE", "APPEARANCEUR", "LABSPEC", "PHURINE", "GLUCOSEU", "HGBUR", "BILIRUBINUR", "KETONESUR", "PROTEINUR", "UROBILINOGEN", "NITRITE", "LEUKOCYTESUR"  Radiological Exams on Admission: No results found.  EKG: Independently reviewed.  06/18/23: Normal sinus rhythm, left ventricular hypertrophy  Assessment/Plan Principal Problem:   Acute pancreatitis Active Problems:   Hypertension   Osteoarthritis of knee   Hyperlipidemia   GERD (gastroesophageal reflux disease)   Crohn's disease of both small and large intestine with other complication (HCC)   Iron deficiency anemia due to chronic blood loss  Acute pancreatitis: Patient presented with epigastric pain radiating towards the back for 3 days. Patient was discharged from [facility] long hospital 3 days ago after management of acute pancreatitis. Patient continues to have pain , nausea vomiting Lipase found to have 8565.  CT Abdomen  06/19/2023 showed finding consistent with acute pancreatitis. Patient denies drinking alcohol, lipid profile normal. Continue NPO, IV hydration. Continue IV pain control with morphine as needed. Continue IV Zofran as needed for nausea and vomiting. Continue to trend lipase. GI is consulted recommended MRCP.  Crohn's disease: Patient was treated for 2 months with budesonide. Patient is taking Entyvio every 8 weeks.  Essential hypertension: Blood pressure is stable. Resume amlodipine and metoprolol when able to take oral. Hydralazine IV as needed.   DVT prophylaxis: Lovenox Code Status: Full code Family Communication: No family  at bed side Disposition Plan:  Status is: Inpatient Remains inpatient appropriate because: Admitted for acute pancreatitis.  Gastroenterology is consulted,  recommended MRCP and conservative management, NPO. with IV hydration and pain control.   Consults called: [facility] Admission status:Inpatient   Willeen Niece MD [facility]   If 7PM-7AM, please contact night-coverage  06/23/2023, 2:31 PM

## 2023-06-23 NOTE — Progress Notes (Signed)
Hospitalist Transfer Note:    Nursing staff, Please call [facility] Admits & Consults System-Wide number on Amion [phone]) as soon as patient's arrival, so appropriate admitting provider can evaluate the pt.  Transferring facility: [facility] Requesting provider:  Dr. Bebe Shaggy (EDP at [facility]) Reason for transfer: admission for further evaluation and management of acute pancreatitis.     82  year old female who presented to [facility] ED complaining of worsening abdominal pain.  The patient was recently discharged from [facility] long hospital after hospitalization lasting from 06/18/23 to 06/21/23 for acute pancreatitis.  Per my discussions with EDP today, the underlying etiology for the patient's acute pancreatitis was unclear.  [facility] was consulted during this recent hospitalization, and there was consideration for proceed with MRCP, however it was ultimately decided to not pursue MRCP as the patient's abdominal discomfort as well as nausea/vomiting was improving at the time.   However, on 06/22/2023, the patient reports worsening of her abdominal discomfort shortly after attempting to eat.  This was associated an episode of nonbloody, nonbilious emesis, following which her abdominal discomfort has improved.  Her lipase on day of discharge was noted to be in the 300s.  Upon presentation this evening, there has been interval increase in lipase to 8500, without reported significant elevation in total bilirubin or alkaline phosphatase to suggest a significant cholestatic process. No new imaging performed this evening.  EDP d/w Dr. Barron Alvine of [facility], who recommended MRCP and conveyed that [facility] will consult.   Vital signs in the ED were notable for the following: Afebrile with systolic blood pressures in the 140s mmHg.   Of note, the patient has strong preference for being admitting to [facility], as she was just discharged from [facility] about 24 hours ago. She is willing to wait at [facility] until a bed becomes  available  at [facility]. If patient's preference changes, it is okay from a clinical standpoint, for her to be admitted at [facility]   Subsequently, I accepted this patient for transfer for inpatient admission to a med-surg bed at [facility] for further work-up and management of the above.      Newton Pigg, DO Hospitalist

## 2023-06-23 NOTE — Plan of Care (Signed)

## 2023-06-23 NOTE — ED Notes (Signed)
Attempted report to nurse on [facility]C-[facility]. Nurse to call back. Jillyn Hidden, RN

## 2023-06-23 NOTE — Progress Notes (Signed)
Patient discharged by [facility] nurse

## 2023-06-23 NOTE — ED Notes (Signed)
Pt. Waiting to be seen

## 2023-06-23 NOTE — Consult Note (Addendum)
Consultation Note   Referring Provider:  [facility] PCP: Jade Raider, MD Primary Gastroenterologist: Jade Head, MD        Reason for Consultation: pancreatitis and Crohn's disease  DOA: 06/22/2023         Hospital Day: 2   ASSESSMENT & PLAN   Patient is a 81 y.o. year old female with a past medical history of Crohn's disease on Entyvio, hyperlipidemia, hypertension, and arthritis   Acute pancreatitis. Etiology not yet determined. Admission for the same a few days ago. Readmitted today with exacerbation of abdominal pain ( she was feeling better at discharge). Etiology? Possibly Entyvio or statin. CT scan on 8/3 did show biliary duct dilation and borderline PD dilation but no evidence for choledocholithiasis and liver tests pretty much normal. No pancreatic lesion seen but could be missed in setting of acute inflammation.  - Hct 38%, renal function normal.  -Will go ahead and get the MRI  / MRCP inpatient  -Continue supportive care with IVF, analgesics, anti-emetics -Needs DVT Prophylaxis  Crohn's ileocolitis, on Entyvio Colonoscopy 01/2019 acute ileitis and nonspecific colitis on random biopsies. Treated x 2 mos with budesonide 9 mg w/ success. Recurrent problems, diagnosed with Crohn's disease required prednisone over budesonide in 2021. CT abdomen pelvis with contrast 07/22/2020 mild to moderate enterocolitis with diffuse involvement of the distal ileum and colon. Entyvio begun 03/04/2021 and has done well on every 8 weeks 300 mg  See PMH for additional medical history   ------------------------------------------------------------------------------------------------------     [facility] Attending   I have taken an interval history, reviewed the chart and examined the patient. I agree with the Advanced Practitioner's note, impression and recommendations with the following additions:  Cause of pancreatitis not clear so will go  ahead with an MRCP now to see what we can learn. Stone(s) in CBD seemed very unlikely but w/ recurrent problems will check.  IVF should be 1.55ml/kg per hr = 115/hr so she is close at 100/hr - I will bump to 125 though  Jade Boop, MD, [facility] See Jade Hernandez on call - gastroenterology for best contact person 06/23/2023 2:35 PM    HISTORY OF PRESENT ILLNESS   Recent GI History :  Jade Hernandez was just release from the hospital after an admission for pancreatitis. CT scan showed biliary duct dilation but no evidence for choledocholithiasis. Symptoms quickly improved. Started on solids and discharged home 8/6 with plans for outpatient MRI / MRCP in 4-6 weeks on 07/19/23.  Entyvio held. Also recommended statin which she did. We suggesting holding Norvasc ( recently started) given remote possibility that it was the culprit but that wasn't held.   Interval History:  Patient returned to ED around midnight with recurrent generalized upper abdominal pain radiating through to her back with associated vomiting after eating vegetable soup. Transferred from ED to [facility] this am. Feels better after pain meds  ED / Admission workup notable for :  Lipase 8500 AST 88 ALT 30 Alk phos and Bili are normal WBC 9.5 Hgb 11.9   Labs and Imaging: Recent Labs    06/22/23 2338  WBC 9.5  HGB 11.9*  HCT 38.1  PLT 262   Recent Labs    06/21/23 0457 06/22/23  2338  NA 138 139  K 3.8 3.7  CL 108 107  CO2 20* 23  GLUCOSE 105* 141*  BUN 11 18  CREATININE 0.46 0.57  CALCIUM 8.9 9.8   Recent Labs    06/22/23 2338  PROT 6.7  ALBUMIN 3.9  AST 88*  ALT 30  ALKPHOS 97  BILITOT 1.1   No results for input(s): "HEPBSAG", "HCVAB", "HEPAIGM", "HEPBIGM" in the last 72 hours. No results for input(s): "LABPROT", "INR" in the last 72 hours.    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    Family History  Problem Relation Age of Onset   Hypertension Mother    Diabetes Mother    Heart disease Father        died from heart attack   Heart disease Brother    Colon cancer Neg Hx    Rectal cancer Neg Hx    Liver cancer Neg Hx    Stomach cancer Neg Hx     Prior to Admission medications   Medication Sig Start Date End Date Taking? Authorizing Provider  acetaminophen (TYLENOL) 500 MG tablet Take 1,000 mg by mouth in the morning and at bedtime.   Yes [provider]  amLODipine (NORVASC) 2.5 MG tablet Take 1 tablet (2.5 mg total) by mouth daily. 02/17/23  Yes Monge, Petra Kuba, NP  cholecalciferol (VITAMIN D3) 25 MCG (1000 UNIT) tablet Take 1,000 Units by mouth daily.   Yes [provider]  metoprolol succinate (TOPROL-XL) 100 MG 24 hr tablet TAKE 1 TABLET(100 MG) BY MOUTH DAILY WITH OR IMMEDIATELY FOLLOWING A MEAL Patient taking differently: Take 100 mg by mouth daily. 05/16/23  Yes Lennette Bihari, MD  Probiotic Product (PROBIOTIC DAILY PO) Take 1 capsule by mouth in the  morning.   Yes [provider]  valsartan (DIOVAN) 160 MG tablet Take 1 tablet (160 mg total) by mouth daily. 12/28/22  Yes Lennette Bihari, MD    Current Facility-Administered Medications  Medication Dose Route Frequency Provider Last Rate Last Admin   amLODipine (NORVASC) tablet 2.5 mg  2.5 mg Oral Daily Gwyneth Sprout, MD   2.5 mg at 06/23/23 1143   fentaNYL (SUBLIMAZE) injection 50 mcg  50 mcg Intravenous Q1H PRN Zadie Rhine, MD   50 mcg at 06/23/23 0902   irbesartan (AVAPRO) tablet 150 mg  150 mg Oral Daily Gwyneth Sprout, MD   150 mg at 06/23/23 1142  lactated ringers infusion   Intravenous Continuous Zadie Rhine, MD 100 mL/hr at 06/23/23 0650 New Bag at 06/23/23 0650   lidocaine (LIDODERM) 5 % 1 patch  1 patch Transdermal Q24H Gwyneth Sprout, MD   1 patch at 06/23/23 0901   metoprolol succinate (TOPROL-XL) 24 hr tablet 100 mg  100 mg Oral Daily Gwyneth Sprout, MD   100 mg at 06/23/23 1142   ondansetron (ZOFRAN) injection 4 mg  4 mg Intravenous Q6H PRN Zadie Rhine, MD   4 mg at 06/23/23 0314    Allergies as of 06/22/2023 - Review Complete 06/22/2023  Allergen Reaction Noted   Feraheme [ferumoxytol] Shortness Of Breath and Palpitations 08/19/2020    Social History   Socioeconomic History   Marital status: Married    Spouse name: Not on file   Number of children: 4   Years of education: Not on file   Highest education level: Not on file  Occupational History   Occupation: retired  Tobacco Use   Smoking status: Never   Smokeless tobacco: Never  Vaping Use   Vaping status: Never Used  Substance and Sexual Activity   Alcohol use: Yes    Alcohol/week: 1.0 standard drink of alcohol    Types: 1 Glasses of wine per week    Comment: Rare   Drug use: No   Sexual activity: Not on file  Other Topics Concern   Not on file  Social History Narrative   The patient is married she is originally from [place]   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 [employer]    She does have children 2 daughters 2 sons multiple grandchildren   Rare alcohol no tobacco or drug use   Social Determinants of Corporate investment banker Strain: Not on file  Food Insecurity: No Food Insecurity (06/18/2023)   Hunger Vital Sign    Worried About Running Out of Food in the Last Year: Never true    Ran Out of Food in the Last Year: Never true  Transportation Needs: No Transportation Needs (06/18/2023)   PRAPARE - Administrator, Civil Service (Medical): No    Lack of Transportation (Non-Medical): No  Physical Activity: Not on file  Stress: Not on file  Social Connections: Not on file  Intimate Partner Violence: Not At Risk (06/18/2023)   Humiliation, Afraid, Rape, and Kick questionnaire    Fear of Current or Ex-Partner: No    Emotionally Abused: No    Physically Abused: No    Sexually Abused: No    Code Status   Code Status: Prior  Review of Systems: All systems reviewed and negative except where noted in HPI.  Physical Exam: Vital signs in last 24 hours: Temp:  [98.3 F (36.8 C)-99.5 F (37.5 C)] 98.8 F (37.1 C) (08/08 1057) Pulse Rate:  [72-98] 81 (08/08 1057) Resp:  [17-20] 18 (08/08 1057) BP: (101-151)/(57-82) 132/69 (08/08 1057) SpO2:  [93 %-99 %] 96 % (08/08 1057) Weight:  [77.7 kg] 77.7 kg (08/07 2324)    General:  Pleasant female in NAD Psych:  Cooperative. Normal mood and affect Eyes: Pupils equal Ears:  Normal auditory acuity Nose: No deformity, discharge or lesions Neck:  Supple, no masses felt Lungs:  Clear to auscultation.  Heart:  Regular rate, regular rhythm.  Abdomen:  Soft, nondistended, mild mid upper abdominal tenderness. Active bowel sounds, no masses felt Rectal :  Deferred Msk: Symmetrical without gross deformities.  Neurologic:  Alert, oriented, grossly normal neurologically Extremities : No edema Skin:  Intact without significant lesions.    Intake/Output from previous day: No intake/output data  recorded. Intake/Output this shift:  No intake/output data recorded.  Principal Problem:   Acute pancreatitis    Willette Cluster, NP-C @  06/23/2023, 12:04 PM

## 2023-06-23 NOTE — ED Notes (Signed)
Attempted report again before departure. No answer on 5C.

## 2023-06-23 NOTE — ED Notes (Signed)
Quianna at [facility] sending transport for Bed ready at [facility] RM# [id number](NS)

## 2023-06-23 NOTE — ED Provider Notes (Signed)
[place] EMERGENCY DEPARTMENT AT [facility] Provider Note   CSN: [id number] Arrival date & time: 06/22/23  2307     History  Chief Complaint  Patient presents with   Abdominal Pain    Jade Hernandez is a 82 y.o. female.  The history is provided by the patient.  Patient with history of Crohn's disease, recent pancreatitis presents with abdominal pain and vomiting.  Patient was just in the hospital for pancreatitis of unclear etiology.  After going home she had a meal of vegetable soup and immediately had intense upper abdominal pain with associated nausea and vomiting.  No chest pain is reported.  No diarrhea.    Past Medical History:  Diagnosis Date   Adenomatous colon polyp    Arthritis    Crohn's disease of both small and large intestine with other complication (HCC) 10/23/2020   Diverticulosis    High cholesterol    Hypertension    IBS (irritable bowel syndrome)    Palpitations    Pre-diabetes    UTI (urinary tract infection)     Home Medications Prior to Admission medications   Medication Sig Start Date End Date Taking? Authorizing Provider  acetaminophen (TYLENOL) 500 MG tablet Take 1,000 mg by mouth in the morning and at bedtime.    [provider]  amLODipine (NORVASC) 2.5 MG tablet Take 1 tablet (2.5 mg total) by mouth daily. 02/17/23   Joylene Grapes, NP  cholecalciferol (VITAMIN D3) 25 MCG (1000 UNIT) tablet Take 1,000 Units by mouth daily.    [provider]  metoprolol succinate (TOPROL-XL) 100 MG 24 hr tablet TAKE 1 TABLET(100 MG) BY MOUTH DAILY WITH OR IMMEDIATELY FOLLOWING A MEAL Patient taking differently: Take 100 mg by mouth daily. 05/16/23   Lennette Bihari, MD  Probiotic Product (PROBIOTIC DAILY PO) Take 1 capsule by mouth in the morning.    [provider]  valsartan (DIOVAN) 160 MG tablet Take 1 tablet (160 mg total) by mouth daily. 12/28/22   Lennette Bihari, MD      Allergies    Feraheme [ferumoxytol]     Review of Systems   Review of Systems  Constitutional:  Negative for fever.  Cardiovascular:  Negative for chest pain.  Gastrointestinal:  Positive for abdominal pain, nausea and vomiting.    Physical Exam Updated Vital Signs BP (!) 148/77   Pulse 91   Temp 98.3 F (36.8 C) (Oral)   Resp 18   Ht 1.6 m (5\' 3" )   Wt 77.7 kg   SpO2 96%   BMI 30.34 kg/m  Physical Exam CONSTITUTIONAL: Elderly, no acute distress HEAD: Normocephalic/atraumatic EYES: EOMI/PERRL, no icterus ENMT: Mucous membranes moist NECK: supple no meningeal signs CV: S1/S2 noted, no murmurs/rubs/gallops noted LUNGS: Lungs are clear to auscultation bilaterally, no apparent distress ABDOMEN: soft, mild epigastric tenderness, no rebound or guarding, bowel sounds noted throughout abdomen GU:no cva tenderness NEURO: Pt is awake/alert/appropriate, moves all extremitiesx4.  No facial droop.   SKIN: warm, color normal PSYCH: no abnormalities of mood noted, alert and oriented to situation  ED Results / Procedures / Treatments   Labs (all labs ordered are listed, but only abnormal results are displayed) Labs Reviewed  LIPASE, BLOOD - Abnormal; Notable for the following components:      Result Value   Lipase 8,565 (*)    All other components within normal limits  COMPREHENSIVE METABOLIC PANEL - Abnormal; Notable for the following components:   Glucose, Bld 141 (*)  AST 88 (*)    All other components within normal limits  CBC - Abnormal; Notable for the following components:   Hemoglobin 11.9 (*)    MCV 78.2 (*)    MCH 24.4 (*)    All other components within normal limits    EKG None  Radiology No results found.  Procedures Procedures    Medications Ordered in ED Medications  ondansetron (ZOFRAN) injection 4 mg (4 mg Intravenous Given 06/23/23 0314)  fentaNYL (SUBLIMAZE) injection 50 mcg (50 mcg Intravenous Given 06/23/23 0316)  ondansetron (ZOFRAN-ODT) disintegrating tablet 4 mg (4 mg Oral Given  06/22/23 2329)    ED Course/ Medical Decision Making/ A&P Clinical Course as of 06/23/23 0331  Thu Jun 23, 2023  0250 D/w dr Barron Alvine with GI Recommends medical admit for MRCP  [DW]  0330 Discussed with Dr. Arlean Hopping for admission Patient would prefer [facility]. Will attempt to place her there.  Will keep patient n.p.o.  [DW]    Clinical Course User Index [DW] Zadie Rhine, MD                                 Medical Decision Making Amount and/or Complexity of Data Reviewed Labs: ordered.  Risk Prescription drug management. Decision regarding hospitalization.   This patient presents to the ED for concern of abdominal pain, this involves an extensive number of treatment options, and is a complaint that carries with it a high risk of complications and morbidity.  The differential diagnosis includes but is not limited to cholecystitis, cholelithiasis, pancreatitis, gastritis, peptic ulcer disease, appendicitis, bowel obstruction, bowel perforation, diverticulitis, AAA, ischemic bowel,ACS    Comorbidities that complicate the patient evaluation: Patient's presentation is complicated by their history of Crohn's  Social Determinants of Health: Patient's  English is a second language   increases the complexity of managing their presentation  Additional history obtained: Additional history obtained from family and spouse Records reviewed previous admission documents  Lab Tests: I Ordered, and personally interpreted labs.  The pertinent results include: Acute pancreatitis   Test Considered: I considered CT imaging, patient just had testing recently, will defer for now  Critical Interventions:   admission  Consultations Obtained: I requested consultation with the admitting physician triad and consultant GI , and discussed  findings as well as pertinent plan - they recommend: admit for MRCP  Reevaluation: After the interventions noted above, I reevaluated the patient  and found that they have :stayed the same  Complexity of problems addressed: Patient's presentation is most consistent with  acute presentation with potential threat to life or bodily function  Disposition: After consideration of the diagnostic results and the patient's response to treatment,  I feel that the patent would benefit from admission   .           Final Clinical Impression(s) / ED Diagnoses Final diagnoses:  Acute pancreatitis, unspecified complication status, unspecified pancreatitis type    Rx / DC Orders ED Discharge Orders     None         Zadie Rhine, MD 06/23/23 [phone]

## 2023-06-24 DIAGNOSIS — K5080 Crohn's disease of both small and large intestine without complications: Secondary | ICD-10-CM

## 2023-06-24 DIAGNOSIS — K8050 Calculus of bile duct without cholangitis or cholecystitis without obstruction: Secondary | ICD-10-CM

## 2023-06-24 DIAGNOSIS — K859 Acute pancreatitis without necrosis or infection, unspecified: Secondary | ICD-10-CM | POA: Diagnosis not present

## 2023-06-24 LAB — URINALYSIS, ROUTINE W REFLEX MICROSCOPIC
Bilirubin Urine: NEGATIVE
Glucose, UA: NEGATIVE mg/dL
Hgb urine dipstick: NEGATIVE
Ketones, ur: NEGATIVE mg/dL
Leukocytes,Ua: NEGATIVE
Nitrite: NEGATIVE
Protein, ur: NEGATIVE mg/dL
Specific Gravity, Urine: 1.004 — ABNORMAL LOW (ref 1.005–1.030)
pH: 6 (ref 5.0–8.0)

## 2023-06-24 LAB — BRAIN NATRIURETIC PEPTIDE: B Natriuretic Peptide: 130.5 pg/mL — ABNORMAL HIGH (ref 0.0–100.0)

## 2023-06-24 MED ORDER — POTASSIUM CHLORIDE 2 MEQ/ML IV SOLN
INTRAVENOUS | Status: DC
  Filled 2023-06-24 (×3): qty 1000

## 2023-06-24 NOTE — Progress Notes (Addendum)
Patient Name: Jade Hernandez Date of Encounter: 06/24/2023, 10:26 AM     Assessment and Plan  Gallstone pancreatitis +Choledocholithiasis  Periampullary diverticulum  Crohn's disease of small and large intestine on Entyvio  --------------------------------------------------------------------------------------------------------  She is improving  Will aim for ERCP tomorrow w/ Dr. Meridee Score - will reassess first but hopefully ok to do then  Have explained we need to consider laparoscopic cholecystectomy after bile duct stone extraction - no decision at this time. MR report sayd no definite stones in GB but ? If one in neck  Reviewed w/ patient and son Chanetta Marshall   I have stopped Lovenox and started SCD's in anticipation of procedure tomorrow Labs ordered also  Iva Boop, MD, [facility] [place] See Loretha Stapler on call - gastroenterology for best contact person 06/24/2023 10:37 AM    Subjective  Feels ok - had some jello not much else yesterday Says 1 small stool MRCP showed CBD stones   Objective  BP 133/63 (BP Location: Left Arm)   Pulse 78   Temp 99.6 F (37.6 C) (Oral)   Resp 16   Ht 5\' 3"  (1.6 m)   Wt 77.7 kg   SpO2 95%   BMI 30.34 kg/m  NAD Lungs cta Cor NL S1S2 no rmg Abd soft NT  Recent Labs  Lab 06/18/23 1351 06/19/23 0541 06/20/23 0615 06/22/23 2338 06/23/23 2355  AST 27 16 15  88* 150*  ALT 13 13 11 30  147*  ALKPHOS 68 50 51 97 116  BILITOT 0.8 1.3* 0.9 1.1 1.7*  PROT 6.7 5.4* 5.6* 6.7 5.3*  ALBUMIN 4.0 2.6* 2.9* 3.9 2.6*    Lab Results  Component Value Date   LIPASE 1,718 (H) 06/23/2023   Recent Labs  Lab 06/22/23 2338 06/23/23 1356 06/23/23 2355  HGB 11.9* 11.8* 10.2*  HCT 38.1 38.5 32.1*  WBC 9.5 9.1 6.3  PLT 262 222 199    Lab Results  Component Value Date   FERRITIN 110 06/23/2023   Recent Labs  Lab 06/18/23 2127 06/19/23 0541 06/20/23 0615 06/21/23 0457 06/22/23 2338 06/23/23 1356 06/23/23 2355   NA 138 137 138 138 139  --  135  K 4.3 3.3* 3.6 3.8 3.7  --  3.2*  CL 106 108 110 108 107  --  105  CO2 24 23 21* 20* 23  --  22  GLUCOSE 113* 98 101* 105* 141*  --  97  BUN 17 17 7* 11 18  --  10  CREATININE 0.51 0.47 0.46 0.46 0.57 0.47 0.45  CALCIUM 8.9 8.3* 8.5* 8.9 9.8  --  8.5*  MG 2.0 2.0  --   --   --   --  1.8  PHOS 3.7 2.8  --   --   --   --  2.5    Images reviewed MR 3D Recon At Scanner CLINICAL DATA:  Acute pancreatitis. Biliary dilatation on abdominal CT. History of Crohn's disease and ureteral bowel syndrome.  EXAM: MRI ABDOMEN WITHOUT AND WITH CONTRAST (INCLUDING MRCP)  TECHNIQUE: Multiplanar multisequence MR imaging of the abdomen was performed both before and after the administration of intravenous contrast. Heavily T2-weighted images of the biliary and pancreatic ducts were obtained, and three-dimensional MRCP images were rendered by post processing.  CONTRAST:  7.43mL GADAVIST GADOBUTROL 1 MMOL/ML IV SOLN  COMPARISON:  Abdominopelvic CT 06/18/2023 and 07/22/2020.  FINDINGS: Lower chest: No acute findings are seen in the lower chest. There is similar linear atelectasis or scarring at both lung  bases.  Hepatobiliary: The liver has a non cirrhotic morphology and shows no significant steatosis or focal abnormality. No significant gallbladder distension, wall thickening or surrounding inflammation. No definite gallstones are seen, although there are several small calculi within the common bile duct, most obvious on coronal image 22 of series 2. As seen on recent CT, there is intra and extrahepatic biliary dilatation which is new from the CT of 07/22/2020. The common hepatic duct measures 1.4 cm in diameter, and the common bile duct 1.3 cm. The duct tapers distally. No ampullary mass is identified. 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  Pancreas: Mild peripancreatic soft  tissue stranding consistent with mild acute interstitial pancreatitis. No focal peripancreatic fluid collection or pancreatic ductal dilatation. The pancreas enhances homogeneously following contrast. No evidence of pancreatic necrosis.  Spleen: Normal in size without focal abnormality.  Adrenals/Urinary Tract: Both adrenal glands appear normal. No evidence of hydronephrosis or suspicious renal lesion. There are small renal cortical and parapelvic cysts bilaterally for which no specific follow-up imaging is recommended.  Stomach/Bowel: The stomach appears unremarkable for its degree of distension. No evidence of bowel wall thickening, distention or surrounding inflammatory change.  Vascular/Lymphatic: There are no enlarged abdominal lymph nodes. Aortic and branch vessel atherosclerosis better seen on CT. No evidence of aneurysm or large vessel occlusion. The portal, superior mesenteric and splenic veins are patent.  Other: No evidence of abdominal wall hernia, ascites or focal extraluminal fluid collection.  Musculoskeletal: No acute or significant osseous findings. Multilevel spondylosis associated with a mild scoliosis.  Unless specific follow-up recommendations are mentioned in the findings or impression sections, no imaging follow-up of any mentioned incidental findings is recommended.  IMPRESSION: 1. Choledocholithiasis with associated intra and extrahepatic biliary dilatation. No ampullary mass identified. Consider further evaluation with ERCP. 2. Mild peripancreatic soft tissue stranding consistent with mild acute interstitial pancreatitis. No significant pancreatic ductal dilatation, focal peripancreatic fluid collection or evidence of pancreatic necrosis. 3. No other significant findings.  Electronically Signed   By: Carey Bullocks M.D.   On: 06/23/2023 16:18 MR ABDOMEN MRCP W WO CONTAST CLINICAL DATA:  Acute pancreatitis. Biliary dilatation on abdominal CT.  History of Crohn's disease and ureteral bowel syndrome.  EXAM: MRI ABDOMEN WITHOUT AND WITH CONTRAST (INCLUDING MRCP)  TECHNIQUE: Multiplanar multisequence MR imaging of the abdomen was performed both before and after the administration of intravenous contrast. Heavily T2-weighted images of the biliary and pancreatic ducts were obtained, and three-dimensional MRCP images were rendered by post processing.  CONTRAST:  7.46mL GADAVIST GADOBUTROL 1 MMOL/ML IV SOLN  COMPARISON:  Abdominopelvic CT 06/18/2023 and 07/22/2020.  FINDINGS: Lower chest: No acute findings are seen in the lower chest. There is similar linear atelectasis or scarring at both lung bases.  Hepatobiliary: The liver has a non cirrhotic morphology and shows no significant steatosis or focal abnormality. No significant gallbladder distension, wall thickening or surrounding inflammation. No definite gallstones are seen, although there are several small calculi within the common bile duct, most obvious on coronal image 22 of series 2. As seen on recent CT, there is intra and extrahepatic biliary dilatation which is new from the CT of 07/22/2020. The common hepatic duct measures 1.4 cm in diameter, and the common bile duct 1.3 cm. The duct tapers distally. No ampullary mass is identified. 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  Pancreas:  Mild peripancreatic soft tissue stranding consistent with mild acute interstitial pancreatitis. No focal peripancreatic fluid collection or pancreatic ductal dilatation. The pancreas enhances homogeneously following contrast. No evidence of pancreatic necrosis.  Spleen: Normal in size without focal abnormality.  Adrenals/Urinary Tract: Both adrenal glands appear normal. No evidence of hydronephrosis or suspicious renal lesion. There are small renal cortical and parapelvic cysts bilaterally for which  no specific follow-up imaging is recommended.  Stomach/Bowel: The stomach appears unremarkable for its degree of distension. No evidence of bowel wall thickening, distention or surrounding inflammatory change.  Vascular/Lymphatic: There are no enlarged abdominal lymph nodes. Aortic and branch vessel atherosclerosis better seen on CT. No evidence of aneurysm or large vessel occlusion. The portal, superior mesenteric and splenic veins are patent.  Other: No evidence of abdominal wall hernia, ascites or focal extraluminal fluid collection.  Musculoskeletal: No acute or significant osseous findings. Multilevel spondylosis associated with a mild scoliosis.  Unless specific follow-up recommendations are mentioned in the findings or impression sections, no imaging follow-up of any mentioned incidental findings is recommended.  IMPRESSION: 1. Choledocholithiasis with associated intra and extrahepatic biliary dilatation. No ampullary mass identified. Consider further evaluation with ERCP. 2. Mild peripancreatic soft tissue stranding consistent with mild acute interstitial pancreatitis. No significant pancreatic ductal dilatation, focal peripancreatic fluid collection or evidence of pancreatic necrosis. 3. No other significant findings.  Electronically Signed   By: Carey Bullocks M.D.   On: 06/23/2023 16:18        Iva Boop, MD, [facility] [place] [facility] See Loretha Stapler on call - gastroenterology for best contact person 06/24/2023 10:26 AM

## 2023-06-24 NOTE — Plan of Care (Signed)

## 2023-06-24 NOTE — Progress Notes (Addendum)
**Note De-Identified vi Obfusction** PROGRESS NOTE    NOUREEN STUMPP  WVP:[id number] DOB: 18-Jun-1941 DOA: 06/22/2023 PCP: Lupit Rider, MD  Chief Complint  Ptient presents with   Abdominl Pin    Brief Nrrtive:   JERISHA RISPER is  82 y.o. femle with PMH significnt for Crohn's disese, hypertension,  Hyperlipidemi,  irritble bowel syndrome presented in the ED with complints of bdominl pin rditing towrds the bck.   Admitted with pncretitis.   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 Ptient is tking Entyvio every 8 weeks.   Bilterl LE Edem Echo 01/2023 with norml EF, norml LV distolic prmeters Follow BNP, follow UA.  Albumin is 2.6.  Essentil hypertension: Blood pressure is stble. Resume mlodipine nd metoprolol when ble to tke orl.  Anemi Trend     DVT prophylxis: SCD Code Sttus: full Fmily Communiction: none Disposition:   Sttus is: Inptient Remins inptient pproprite becuse: need for continued inptient cre   Consultnts:  GI  Procedures:  none  Antimicrobils:  Anti-infectives (From dmission, onwrd)    None       Subjective: Abdominl pin is improved  Objective: Vitls:   06/23/23 1701 06/23/23 1940 06/24/23 0334 06/24/23 0742  BP: 139/65 (!) 129/59 138/68 133/63   Pulse: 77 77 81 78  Resp: 18 18 18 16   Temp: 98.3 F (36.8 C) 98.9 F (37.2 C) 98.3 F (36.8 C) 99.6 F (37.6 C)  TempSrc: Orl Orl Orl Orl  SpO2: 95% 99% 94% 95%  Weight:      Height:       No intke or output dt in the 24 hours ending 06/24/23 1039 Filed Weights   06/22/23 2324  Weight: 77.7 kg    Exmintion:  Generl exm: Appers clm nd comfortble  Respirtory system: unlbored Crdiovsculr system: RRR Gstrointestinl system: Abdomen is nondistended, soft nd nontender Centrl nervous system: Alert nd oriented. No focl neurologicl deficits. Extremities: 1+ LE  Dt Reviewed: I hve personlly reviewed following lbs nd imging studies  CBC: Recent Lbs  Lb 06/18/23 1351 06/19/23 0541 06/20/23 0615 06/22/23 2338 06/23/23 1356 06/23/23 2355  WBC 9.4 7.2 4.7 9.5 9.1 6.3  NEUTROABS 8.3*  --   --   --   --   --   HGB 13.0 10.2* 10.5* 11.9* 11.8* 10.2*  HCT 41.4 33.5* 33.6* 38.1 38.5 32.1*  MCV 78.7* 82.9 80.6 78.2* 79.2* 79.3*  PLT 151 181 179 262 222 199    Bsic Metbolic Pnel: Recent Lbs  Lb 06/18/23 2127 06/19/23 0541 06/20/23 0615 06/21/23 0457 06/22/23 2338 06/23/23 1356 06/23/23 2355  NA 138 137 138 138 139  --  135  K 4.3 3.3* 3.6 3.8 3.7  --  3.2*  CL 106 108 110 108 107  --  105  CO2 24 23 21* 20* 23  --  22  GLUCOSE 113* 98 101* 105* **Note De-Identified vi Obfusction** 141*  --  97  BUN 17 17 7* 11 18  --  10  CREATININE 0.51 0.47 0.46 0.46 0.57 0.47 0.45  CALCIUM 8.9 8.3* 8.5* 8.9 9.8  --  8.5*  MG 2.0 2.0  --   --   --   --  1.8  PHOS 3.7 2.8  --   --   --   --  2.5    GFR: Estimted Cretinine Clernce: 55.3 mL/min (by [employer] formul bsed on SCr of 0.45 mg/dL).  Liver Function Tests: Recent Lbs  Lb 06/18/23 1351 06/19/23 0541 06/20/23 0615 06/22/23 2338 06/23/23 2355  AST 27 16 15  88* 150*  ALT 13 13 11 30  147*  ALKPHOS 68 50 51 97 116  BILITOT 0.8 1.3* 0.9 1.1 1.7*  PROT 6.7 5.4* 5.6* 6.7 5.3*  ALBUMIN 4.0 2.6* 2.9* 3.9 2.6*     CBG: No results for input(s): "GLUCAP" in the lst 168 hours.   No results found for this or ny previous visit (from the pst 240 hour(s)).       Rdiology Studies: MR ABDOMEN MRCP W WO CONTAST  Result Dte: 06/23/2023 CLINICAL DATA:  Acute pncretitis. Biliry dilttion on bdominl CT. History of Crohn's disese nd ureterl bowel syndrome. EXAM: MRI ABDOMEN WITHOUT AND WITH CONTRAST (INCLUDING MRCP) TECHNIQUE: Multiplnr multisequence MR imging of the bdomen ws performed both before nd fter the dministrtion of intrvenous contrst. Hevily T2-weighted imges of the biliry nd pncretic ducts were obtined, nd three-dimensionl MRCP imges were rendered by post processing. CONTRAST:  7.43mL GADAVIST GADOBUTROL 1 MMOL/ML IV SOLN COMPARISON:  Abdominopelvic CT 06/18/2023 nd 07/22/2020. FINDINGS: Lower chest: No cute findings re seen in the lower chest. There is similr liner telectsis or scrring t both lung bses. Heptobiliry: The liver hs  non cirrhotic morphology nd shows no significnt stetosis or focl bnormlity. No significnt gllbldder distension, wll thickening or surrounding inflmmtion. No definite gllstones re seen, lthough there re severl smll clculi within the common bile duct, most obvious on coronl imge 22 of series 2. As seen on recent CT, there is intr nd extrheptic biliry dilttion which is new from the CT of 07/22/2020. The common heptic duct mesures 1.4 cm in dimeter, nd the common bile duct 1.3 cm. The duct tpers distlly. No mpullry mss is identified. 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 Pncres: Mild peripncretic soft tissue strnding consistent with mild cute interstitil pncretitis. No focl peripncretic fluid collection or pncretic ductl dilttion. The pncres enhnces homogeneously following contrst. No evidence of  pncretic necrosis. Spleen: Norml in size without focl bnormlity. Adrenls/Urinry Trct: Both drenl glnds pper norml. No evidence of hydronephrosis or suspicious renl lesion. There re smll renl corticl nd prpelvic cysts bilterlly for which no specific follow-up imging is recommended. Stomch/Bowel: The stomch ppers unremrkble for its degree of distension. No evidence of bowel wll thickening, distention or surrounding inflmmtory chnge. Vsculr/Lymphtic: There re no enlrged bdominl lymph nodes. Aortic nd brnch vessel therosclerosis better seen on CT. No evidence of neurysm or lrge vessel occlusion. The portl, superior mesenteric nd splenic veins re ptent. Other: No evidence of bdominl wll herni, scites or focl extrluminl fluid collection. Musculoskeletl: No cute or significnt osseous findings. Multilevel spondylosis ssocited with  mild scoliosis. Unless specific follow-up recommendtions re mentioned in the findings or impression sections, no imging follow-up of ny mentioned incidentl findings is recommended. IMPRESSION: 1. Choledocholithisis with ssocited intr nd extrheptic **Note De-Identified vi Obfusction** biliry dilttion. No mpullry mss identified. Consider further evlution with ERCP. 2. Mild peripncretic soft tissue strnding consistent with mild cute interstitil pncretitis. No significnt pncretic ductl dilttion, focl peripncretic fluid collection or evidence of pncretic necrosis. 3. No other significnt findings. Electroniclly Signed   By: Crey Bullocks M.D.   On: 06/23/2023 16:18   MR 3D Recon At Scnner  Result Dte: 06/23/2023 CLINICAL DATA:  Acute pncretitis. Biliry dilttion on bdominl CT. History of Crohn's disese nd ureterl bowel syndrome. EXAM: MRI ABDOMEN WITHOUT AND WITH CONTRAST (INCLUDING MRCP) TECHNIQUE: Multiplnr multisequence MR imging of the bdomen ws performed both before nd fter the dministrtion of intrvenous  contrst. Hevily T2-weighted imges of the biliry nd pncretic ducts were obtined, nd three-dimensionl MRCP imges were rendered by post processing. CONTRAST:  7.110mL GADAVIST GADOBUTROL 1 MMOL/ML IV SOLN COMPARISON:  Abdominopelvic CT 06/18/2023 nd 07/22/2020. FINDINGS: Lower chest: No cute findings re seen in the lower chest. There is similr liner telectsis or scrring t both lung bses. Heptobiliry: The liver hs  non cirrhotic morphology nd shows no significnt stetosis or focl bnormlity. No significnt gllbldder distension, wll thickening or surrounding inflmmtion. No definite gllstones re seen, lthough there re severl smll clculi within the common bile duct, most obvious on coronl imge 22 of series 2. As seen on recent CT, there is intr nd extrheptic biliry dilttion which is new from the CT of 07/22/2020. The common heptic duct mesures 1.4 cm in dimeter, nd the common bile duct 1.3 cm. The duct tpers distlly. No mpullry mss is identified. 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 Pncres: Mild peripncretic soft tissue strnding consistent with mild cute interstitil pncretitis. No focl peripncretic fluid collection or pncretic ductl dilttion. The pncres enhnces homogeneously following contrst. No evidence of pncretic necrosis. Spleen: Norml in size without focl bnormlity. Adrenls/Urinry Trct: Both drenl glnds pper norml. No evidence of hydronephrosis or suspicious renl lesion. There re smll renl corticl nd prpelvic cysts bilterlly for which no specific follow-up imging is recommended. Stomch/Bowel: The stomch ppers unremrkble for its degree of distension. No evidence of bowel wll thickening, distention or surrounding inflmmtory chnge. Vsculr/Lymphtic: There re no enlrged bdominl lymph nodes. Aortic nd brnch vessel  therosclerosis better seen on CT. No evidence of neurysm or lrge vessel occlusion. The portl, superior mesenteric nd splenic veins re ptent. Other: No evidence of bdominl wll herni, scites or focl extrluminl fluid collection. Musculoskeletl: No cute or significnt osseous findings. Multilevel spondylosis ssocited with  mild scoliosis. Unless specific follow-up recommendtions re mentioned in the findings or impression sections, no imging follow-up of ny mentioned incidentl findings is recommended. IMPRESSION: 1. Choledocholithisis with ssocited intr nd extrheptic biliry dilttion. No mpullry mss identified. Consider further evlution with ERCP. 2. Mild peripncretic soft tissue strnding consistent with mild cute interstitil pncretitis. No significnt pncretic ductl dilttion, focl peripncretic fluid collection or evidence of pncretic necrosis. 3. No other significnt findings. Electroniclly Signed   By: Crey Bullocks M.D.   On: 06/23/2023 16:18        Scheduled Meds:  mLODipine  2.5 mg Orl Dily   docuste sodium  100 mg Orl BID   irbesrtn  150 mg Orl Dily   lidocine  1 ptch Trnsderml Q24H   metoprolol succinte  100 mg Orl Dily   senn  1 tblet Orl BID   Continuous Infusions:  lctted ringers 1,000 mL with potssium chloride 20 mEq infusion LOS: 1 day    Time spent: over 30 min    Lacretia Nicks, MD [facility]   To contact the attending provider between 7A-7P or the covering provider during after hours 7P-7A, please log into the web site [email or web address] and access using universal [place] password for that web site. If you do not have the password, please call the hospital operator.  06/24/2023, 10:39 AM

## 2023-06-24 NOTE — Consult Note (Signed)
   [facility] Inpatient Consult   06/24/2023  Jade Hernandez 1941-01-11 [id number]  [facility] [[facility]]  Accountable Care Organization [ACO] Patient: Medicare ACO REACH insurance  Primary Care Provider:  [name], MD  with Deboraha Sprang is listed to provide the transition of care follow up calls and appointments  Patient screened for less than 7 days readmission hospitalizations with noted low risk score for unplanned readmission risk to assess for potential post hospital transition for care coordination.  Review of patient's electronic medical record reveals patient is followed by [facility] physician team   Plan:  Continue to follow progress and disposition to assess for post hospital community care coordination/management needs.   Referral request for community care coordination:  anticipate the community [facility] for follow up with [facility] team.   Of note, [facility] Care Management/Population Health does not replace or interfere with any arrangements made by the Inpatient Transition of Care team.  For questions contact:   Charlesetta Shanks, RN BSN CCM [facility]  [phone] business mobile phone Toll free office [phone]  *Concierge Line  [phone] Fax number: [phone] [email or web address]

## 2023-06-25 ENCOUNTER — Encounter (HOSPITAL_COMMUNITY)
Admission: EM | Disposition: A | Discharge status: Home / Self Care | Payer: Self-pay | Source: Home / Self Care | Attending: Family Medicine

## 2023-06-25 ENCOUNTER — Inpatient Hospital Stay (HOSPITAL_COMMUNITY): Payer: Medicare Other

## 2023-06-25 ENCOUNTER — Encounter (HOSPITAL_COMMUNITY): Payer: Self-pay | Admitting: Family Medicine

## 2023-06-25 ENCOUNTER — Inpatient Hospital Stay (HOSPITAL_COMMUNITY): Payer: Medicare Other | Admitting: Anesthesiology

## 2023-06-25 DIAGNOSIS — K851 Biliary acute pancreatitis without necrosis or infection: Secondary | ICD-10-CM | POA: Diagnosis not present

## 2023-06-25 DIAGNOSIS — K8050 Calculus of bile duct without cholangitis or cholecystitis without obstruction: Secondary | ICD-10-CM

## 2023-06-25 DIAGNOSIS — K838 Other specified diseases of biliary tract: Secondary | ICD-10-CM | POA: Diagnosis not present

## 2023-06-25 DIAGNOSIS — K859 Acute pancreatitis without necrosis or infection, unspecified: Secondary | ICD-10-CM | POA: Diagnosis not present

## 2023-06-25 DIAGNOSIS — K449 Diaphragmatic hernia without obstruction or gangrene: Secondary | ICD-10-CM

## 2023-06-25 HISTORY — PX: ERCP: SHX5425

## 2023-06-25 HISTORY — DX: Calculus of bile duct without cholangitis or cholecystitis without obstruction: K80.50

## 2023-06-25 HISTORY — PX: SPHINCTEROTOMY: SHX5544

## 2023-06-25 HISTORY — PX: REMOVAL OF STONES: SHX5545

## 2023-06-25 LAB — COMPREHENSIVE METABOLIC PANEL WITH GFR
ALT: 86 U/L — ABNORMAL HIGH (ref 0–44)
AST: 47 U/L — ABNORMAL HIGH (ref 15–41)
Albumin: 2.4 g/dL — ABNORMAL LOW (ref 3.5–5.0)
Alkaline Phosphatase: 99 U/L (ref 38–126)
Anion gap: 7 (ref 5–15)
BUN: <5 mg/dL — ABNORMAL LOW (ref 8–23)
CO2: 22 mmol/L (ref 22–32)
Calcium: 8.4 mg/dL — ABNORMAL LOW (ref 8.9–10.3)
Chloride: 107 mmol/L (ref 98–111)
Creatinine, Ser: 0.47 mg/dL (ref 0.44–1.00)
GFR, Estimated: >60 mL/min
Glucose, Bld: 101 mg/dL — ABNORMAL HIGH (ref 70–99)
Potassium: 3.1 mmol/L — ABNORMAL LOW (ref 3.5–5.1)
Sodium: 136 mmol/L (ref 135–145)
Total Bilirubin: 0.7 mg/dL (ref 0.3–1.2)
Total Protein: 5.1 g/dL — ABNORMAL LOW (ref 6.5–8.1)

## 2023-06-25 LAB — LIPASE, BLOOD: Lipase: 446 U/L — ABNORMAL HIGH (ref 11–51)

## 2023-06-25 SURGERY — ERCP, WITH INTERVENTION IF INDICATED
Anesthesia: General

## 2023-06-25 MED ORDER — SODIUM CHLORIDE 0.9 % IV SOLN
1.5000 g | Freq: Once | INTRAVENOUS | Status: AC
  Administered 2023-06-25: 1.5 g via INTRAVENOUS
  Filled 2023-06-25 (×2): qty 4

## 2023-06-25 MED ORDER — POTASSIUM CHLORIDE 10 MEQ/100ML IV SOLN
10.0000 meq | INTRAVENOUS | Status: AC
  Administered 2023-06-25 (×2): 10 meq via INTRAVENOUS
  Filled 2023-06-25: qty 100

## 2023-06-25 MED ORDER — ROCURONIUM BROMIDE 10 MG/ML (PF) SYRINGE
PREFILLED_SYRINGE | INTRAVENOUS | Status: DC | PRN
  Administered 2023-06-25: 50 mg via INTRAVENOUS

## 2023-06-25 MED ORDER — LIDOCAINE 2% (20 MG/ML) 5 ML SYRINGE
INTRAMUSCULAR | Status: DC | PRN
  Administered 2023-06-25: 60 mg via INTRAVENOUS

## 2023-06-25 MED ORDER — SODIUM CHLORIDE 0.9 % IV SOLN
INTRAVENOUS | Status: DC | PRN
  Administered 2023-06-25: 15 mL

## 2023-06-25 MED ORDER — DICLOFENAC SUPPOSITORY 100 MG
RECTAL | Status: AC
  Filled 2023-06-25: qty 1

## 2023-06-25 MED ORDER — LACTATED RINGERS IV SOLN: INTRAVENOUS | Status: DC

## 2023-06-25 MED ORDER — ONDANSETRON HCL 4 MG/2ML IJ SOLN
INTRAMUSCULAR | Status: DC | PRN
  Administered 2023-06-25: 4 mg via INTRAVENOUS

## 2023-06-25 MED ORDER — FENTANYL CITRATE (PF) 250 MCG/5ML IJ SOLN
INTRAMUSCULAR | Status: AC
  Filled 2023-06-25: qty 5

## 2023-06-25 MED ORDER — SODIUM CHLORIDE 0.9 % IV SOLN: INTRAVENOUS | Status: DC

## 2023-06-25 MED ORDER — GLUCAGON HCL RDNA (DIAGNOSTIC) 1 MG IJ SOLR
INTRAMUSCULAR | Status: DC | PRN
  Administered 2023-06-25 (×2): .25 mg via INTRAVENOUS

## 2023-06-25 MED ORDER — ONDANSETRON HCL 4 MG/2ML IJ SOLN: 4.0000 mg | Freq: Once | INTRAMUSCULAR | Status: DC | PRN

## 2023-06-25 MED ORDER — FENTANYL CITRATE (PF) 250 MCG/5ML IJ SOLN
INTRAMUSCULAR | Status: DC | PRN
  Administered 2023-06-25: 50 ug via INTRAVENOUS

## 2023-06-25 MED ORDER — GLUCAGON HCL RDNA (DIAGNOSTIC) 1 MG IJ SOLR
INTRAMUSCULAR | Status: AC
  Filled 2023-06-25: qty 1

## 2023-06-25 MED ORDER — PROPOFOL 10 MG/ML IV BOLUS
INTRAVENOUS | Status: AC
  Filled 2023-06-25: qty 20

## 2023-06-25 MED ORDER — PHENYLEPHRINE HCL-NACL 20-0.9 MG/250ML-% IV SOLN
INTRAVENOUS | Status: DC | PRN
  Administered 2023-06-25: 50 ug/min via INTRAVENOUS

## 2023-06-25 MED ORDER — POTASSIUM CHLORIDE 2 MEQ/ML IV SOLN
INTRAVENOUS | Status: DC
  Filled 2023-06-25: qty 1000

## 2023-06-25 MED ORDER — SUGAMMADEX SODIUM 200 MG/2ML IV SOLN
INTRAVENOUS | Status: DC | PRN
  Administered 2023-06-25: 310.8 mg via INTRAVENOUS

## 2023-06-25 MED ORDER — DICLOFENAC SUPPOSITORY 100 MG
RECTAL | Status: DC | PRN
  Administered 2023-06-25: 100 mg via RECTAL

## 2023-06-25 MED ORDER — POTASSIUM CHLORIDE 10 MEQ/100ML IV SOLN
10.0000 meq | INTRAVENOUS | Status: AC
  Administered 2023-06-25 – 2023-06-26 (×4): 10 meq via INTRAVENOUS
  Filled 2023-06-25 (×4): qty 100

## 2023-06-25 MED ORDER — OXYCODONE HCL 5 MG PO TABS: 5.0000 mg | ORAL_TABLET | Freq: Once | ORAL | Status: DC | PRN

## 2023-06-25 MED ORDER — PROPOFOL 10 MG/ML IV BOLUS
INTRAVENOUS | Status: DC | PRN
  Administered 2023-06-25: 120 mg via INTRAVENOUS

## 2023-06-25 MED ORDER — FENTANYL CITRATE (PF) 100 MCG/2ML IJ SOLN: 25.0000 ug | INTRAMUSCULAR | Status: DC | PRN

## 2023-06-25 MED ORDER — FENTANYL CITRATE (PF) 100 MCG/2ML IJ SOLN
INTRAMUSCULAR | Status: AC
  Filled 2023-06-25: qty 2

## 2023-06-25 MED ORDER — PHENOL 1.4 % MT LIQD
1.0000 | OROMUCOSAL | Status: DC | PRN
  Filled 2023-06-25: qty 177

## 2023-06-25 MED ORDER — OXYCODONE HCL 5 MG/5ML PO SOLN: 5.0000 mg | Freq: Once | ORAL | Status: DC | PRN

## 2023-06-25 MED ORDER — INDOMETHACIN 50 MG RE SUPP: 100.0000 mg | Freq: Once | RECTAL | Status: DC

## 2023-06-25 NOTE — Op Note (Signed)
[facility] Patient Name: Jade Hernandez Procedure Date : 06/25/2023 MRN: [id number] Attending MD: Corliss Parish , MD, [id number] Date of Birth: [birth year] CSN: [id number] Age: 82 Admit Type: Inpatient Procedure:                ERCP Indications:              Bile duct stone(s), Abnormal MRCP, Gallstone                            associated acute recurrent pancreatitis Providers:                Corliss Parish, MD, Lorenza Evangelist, RN, Harrington Challenger, Technician Referring MD:             Inpatient Medical Service Medicines:                General Anesthesia, Unasyn 1.5 g IV, Diclofenac 100                            mg rectal, Glucagon 0.5 mg IV Complications:            No immediate complications. Estimated Blood Loss:     Estimated blood loss was minimal. Procedure:                Pre-Anesthesia Assessment:                           - Prior to the procedure, a History and Physical                            was performed, and patient medications and                            allergies were reviewed. The patient's tolerance of                            previous anesthesia was also reviewed. The risks                            and benefits of the procedure and the sedation                            options and risks were discussed with the patient.                            All questions were answered, and informed consent                            was obtained. Prior Anticoagulants: The patient has                            taken no anticoagulant or antiplatelet agents. ASA  Grade Assessment: III - A patient with severe                            systemic disease. After reviewing the risks and                            benefits, the patient was deemed in satisfactory                            condition to undergo the procedure.                           After obtaining informed consent, the scope was                             passed under direct vision. Throughout the                            procedure, the patient's blood pressure, pulse, and                            oxygen saturations were monitored continuously. The                            [employer] D single use                            duodenoscope was introduced through the mouth, and                            used to inject contrast into and used to cannulate                            the bile duct. The ERCP was accomplished without                            difficulty. The patient tolerated the procedure. Scope In: Scope Out: Findings:      The scout film was normal.      The esophagus was successfully intubated under direct vision without       detailed examination of the pharynx, larynx, and associated structures.       A 2 cm hiatal hernia was present. No gross lesions were noted in the       entire examined stomach. No gross lesions were noted in the duodenal       bulb, in the first portion of the duodenum and in the second portion of       the duodenum. The major papilla was located partially within a       diverticulum.      A short 0.035 inch Soft Jagwire was passed into the biliary tree. The       Hydratome sphincterotome was passed over the guidewire and the bile duct       was then deeply cannulated. Contrast was injected. I personally       interpreted the bile duct images. Ductal  flow of contrast was adequate.       Image quality was adequate. Contrast extended to the hepatic ducts.       Opacification of the entire biliary tree except for the cystic duct and       gallbladder was successful. The lower third of the main duct contained       filling defect thought to be a stone and sludge. The main bile duct was       moderately dilated. The largest diameter was 14 mm. An 8 mm biliary       sphincterotomy was made with a monofilament Hydratome sphincterotome       using ERBE  electrocautery. There was no post-sphincterotomy bleeding. To       discover objects, the biliary tree was swept with a retrieval balloon.       Sludge was swept from the duct. Two stones were removed. No stones       remained. An occlusion cholangiogram was performed that showed no       further significant biliary pathology.      A pancreatogram was not performed.      The duodenoscope was withdrawn from the patient. Impression:               - 2 cm hiatal hernia.                           - No gross lesions in the entire stomach.                           - No gross lesions in the duodenal bulb, in the                            first portion of the duodenum and in the second                            portion of the duodenum.                           - The major papilla was located partially within a                            diverticulum.                           - A filling defect consistent with a stone and                            sludge was seen on the cholangiogram.                           - The entire main bile duct was moderately dilated.                           - Choledocholithiasis was found. Complete removal                            was accomplished by biliary sphincterotomy and  balloon trawl. Recommendation:           - The patient will be observed post-procedure,                            until all discharge criteria are met.                           - Return patient to hospital ward for ongoing care.                           - Advance diet as tolerated.                           - Watch for pancreatitis, bleeding, perforation,                            and cholangitis.                           - Observe patient's clinical course.                           - Check lipase and liver enzymes (AST, ALT,                            alkaline phosphatase, bilirubin) in the morning.                           - If no contraindication and  patient is doing well,                            surgical consultation for consideration of interval                            cholecystectomy during this hospitalization.                           - The findings and recommendations were discussed                            with the patient.                           - The findings and recommendations were discussed                            with the patient's family.                           - The findings and recommendations were discussed                            with the referring physician. Procedure Code(s):        --- Professional ---  [id number], Endoscopic retrograde                            cholangiopancreatography (ERCP); with removal of                            calculi/debris from biliary/pancreatic duct(s)                           43262, Endoscopic retrograde                            cholangiopancreatography (ERCP); with                            sphincterotomy/papillotomy                           74328, 26, Endoscopic catheterization of the                            biliary ductal system, radiological supervision and                            interpretation Diagnosis Code(s):        --- Professional ---                           K44.9, Diaphragmatic hernia without obstruction or                            gangrene                           R93.2, Abnormal findings on diagnostic imaging of                            liver and biliary tract                           K80.50, Calculus of bile duct without cholangitis                            or cholecystitis without obstruction                           K85.10, Biliary acute pancreatitis without necrosis                            or infection                           K83.8, Other specified diseases of biliary tract CPT copyright 2022 [employer]. All rights reserved. The codes documented in this report are preliminary  and upon coder review may  be revised to meet current compliance requirements. Corliss Parish, MD 06/25/2023 1:11:35 PM Number of Addenda: 0

## 2023-06-25 NOTE — H&P (View-Only) (Signed)
Patient Name: Jade Hernandez Date of Encounter: 06/25/2023, 10:01 AM     Assessment and Plan  Gallstone pancreatitis +Choledocholithiasis  Periampullary diverticulum  Crohn's disease of small and large intestine on Entyvio  Hypokalemia --------------------------------------------------------------------------------------------------------  She is improving  ERCP today hopefully  [facility] consult after ERCP likely  I have stopped Lovenox and started SCD's in anticipation of procedure tomorrow Labs ordered also  Iva Boop, MD, [facility] See AMION on call - gastroenterology for best contact person 06/25/2023 10:01 AM    Subjective  OK - nop pain   Objective  BP 134/78 (BP Location: Left Arm)   Pulse 75   Temp 98.7 F (37.1 C) (Oral)   Resp 16   Ht 5\' 3"  (1.6 m)   Wt 77.7 kg   SpO2 97%   BMI 30.34 kg/m  NAD  Abd soft NT  Recent Labs  Lab 06/19/23 0541 06/20/23 0615 06/22/23 2338 06/23/23 2355 06/25/23 0044  AST 16 15 88* 150* 47*  ALT 13 11 30  147* 86*  ALKPHOS 50 51 97 116 99  BILITOT 1.3* 0.9 1.1 1.7* 0.7  PROT 5.4* 5.6* 6.7 5.3* 5.1*  ALBUMIN 2.6* 2.9* 3.9 2.6* 2.4*    Lab Results  Component Value Date   LIPASE 446 (H) 06/25/2023   Recent Labs  Lab 06/23/23 1356 06/23/23 2355 06/25/23 0044  HGB 11.8* 10.2* 9.5*  HCT 38.5 32.1* 31.6*  WBC 9.1 6.3 4.4  PLT 222 199 175    Lab Results  Component Value Date   FERRITIN 110 06/23/2023   Recent Labs  Lab 06/18/23 2127 06/19/23 0541 06/20/23 0615 06/21/23 0457 06/22/23 2338 06/23/23 1356 06/23/23 2355 06/25/23 0044  NA 138 137 138 138 139  --  135 136  K 4.3 3.3* 3.6 3.8 3.7  --  3.2* 3.1*  CL 106 108 110 108 107  --  105 107  CO2 24 23 21* 20* 23  --  22 22  GLUCOSE 113* 98 101* 105* 141*  --  97 101*  BUN 17 17 7* 11 18  --  10 <5*  CREATININE 0.51 0.47 0.46 0.46 0.57 0.47 0.45 0.47  CALCIUM 8.9 8.3* 8.5* 8.9 9.8  --  8.5* 8.4*  MG 2.0 2.0  --    --   --   --  1.8  --   PHOS 3.7 2.8  --   --   --   --  2.5  --     Images reviewed MR 3D Recon At Scanner CLINICAL DATA:  Acute pancreatitis. Biliary dilatation on abdominal CT. History of Crohn's disease and ureteral bowel syndrome.  EXAM: MRI ABDOMEN WITHOUT AND WITH CONTRAST (INCLUDING MRCP)  TECHNIQUE: Multiplanar multisequence MR imaging of the abdomen was performed both before and after the administration of intravenous contrast. Heavily T2-weighted images of the biliary and pancreatic ducts were obtained, and three-dimensional MRCP images were rendered by post processing.  CONTRAST:  7.69mL GADAVIST GADOBUTROL 1 MMOL/ML IV SOLN  COMPARISON:  Abdominopelvic CT 06/18/2023 and 07/22/2020.  FINDINGS: Lower chest: No acute findings are seen in the lower chest. There is similar linear atelectasis or scarring at both lung bases.  Hepatobiliary: The liver has a non cirrhotic morphology and shows no significant steatosis or focal abnormality. No significant gallbladder distension, wall thickening or surrounding inflammation. No definite gallstones are seen, although there are several small calculi within the common bile duct, most obvious on coronal image 22 of series 2. As seen  on recent CT, there is intra and extrahepatic biliary dilatation which is new from the CT of 07/22/2020. The common hepatic duct measures 1.4 cm in diameter, and the common bile duct 1.3 cm. The duct tapers distally. No ampullary mass is identified. 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  Pancreas: Mild peripancreatic soft tissue stranding consistent with mild acute interstitial pancreatitis. No focal peripancreatic fluid collection or pancreatic ductal dilatation. The pancreas enhances homogeneously following contrast. No evidence of pancreatic necrosis.  Spleen: Normal in size without focal  abnormality.  Adrenals/Urinary Tract: Both adrenal glands appear normal. No evidence of hydronephrosis or suspicious renal lesion. There are small renal cortical and parapelvic cysts bilaterally for which no specific follow-up imaging is recommended.  Stomach/Bowel: The stomach appears unremarkable for its degree of distension. No evidence of bowel wall thickening, distention or surrounding inflammatory change.  Vascular/Lymphatic: There are no enlarged abdominal lymph nodes. Aortic and branch vessel atherosclerosis better seen on CT. No evidence of aneurysm or large vessel occlusion. The portal, superior mesenteric and splenic veins are patent.  Other: No evidence of abdominal wall hernia, ascites or focal extraluminal fluid collection.  Musculoskeletal: No acute or significant osseous findings. Multilevel spondylosis associated with a mild scoliosis.  Unless specific follow-up recommendations are mentioned in the findings or impression sections, no imaging follow-up of any mentioned incidental findings is recommended.  IMPRESSION: 1. Choledocholithiasis with associated intra and extrahepatic biliary dilatation. No ampullary mass identified. Consider further evaluation with ERCP. 2. Mild peripancreatic soft tissue stranding consistent with mild acute interstitial pancreatitis. No significant pancreatic ductal dilatation, focal peripancreatic fluid collection or evidence of pancreatic necrosis. 3. No other significant findings.  Electronically Signed   By: Carey Bullocks M.D.   On: 06/23/2023 16:18 MR ABDOMEN MRCP W WO CONTAST CLINICAL DATA:  Acute pancreatitis. Biliary dilatation on abdominal CT. History of Crohn's disease and ureteral bowel syndrome.  EXAM: MRI ABDOMEN WITHOUT AND WITH CONTRAST (INCLUDING MRCP)  TECHNIQUE: Multiplanar multisequence MR imaging of the abdomen was performed both before and after the administration of intravenous contrast. Heavily  T2-weighted images of the biliary and pancreatic ducts were obtained, and three-dimensional MRCP images were rendered by post processing.  CONTRAST:  7.58mL GADAVIST GADOBUTROL 1 MMOL/ML IV SOLN  COMPARISON:  Abdominopelvic CT 06/18/2023 and 07/22/2020.  FINDINGS: Lower chest: No acute findings are seen in the lower chest. There is similar linear atelectasis or scarring at both lung bases.  Hepatobiliary: The liver has a non cirrhotic morphology and shows no significant steatosis or focal abnormality. No significant gallbladder distension, wall thickening or surrounding inflammation. No definite gallstones are seen, although there are several small calculi within the common bile duct, most obvious on coronal image 22 of series 2. As seen on recent CT, there is intra and extrahepatic biliary dilatation which is new from the CT of 07/22/2020. The common hepatic duct measures 1.4 cm in diameter, and the common bile duct 1.3 cm. The duct tapers distally. No ampullary mass is identified. 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  Pancreas: Mild peripancreatic soft tissue stranding consistent with mild acute interstitial pancreatitis. No focal peripancreatic fluid collection or pancreatic ductal dilatation. The pancreas enhances homogeneously following contrast. No evidence of pancreatic necrosis.  Spleen: Normal in size without focal abnormality.  Adrenals/Urinary Tract: Both adrenal glands appear normal. No evidence of hydronephrosis or suspicious renal  lesion. There are small renal cortical and parapelvic cysts bilaterally for which no specific follow-up imaging is recommended.  Stomach/Bowel: The stomach appears unremarkable for its degree of distension. No evidence of bowel wall thickening, distention or surrounding inflammatory change.  Vascular/Lymphatic: There are no enlarged abdominal lymph  nodes. Aortic and branch vessel atherosclerosis better seen on CT. No evidence of aneurysm or large vessel occlusion. The portal, superior mesenteric and splenic veins are patent.  Other: No evidence of abdominal wall hernia, ascites or focal extraluminal fluid collection.  Musculoskeletal: No acute or significant osseous findings. Multilevel spondylosis associated with a mild scoliosis.  Unless specific follow-up recommendations are mentioned in the findings or impression sections, no imaging follow-up of any mentioned incidental findings is recommended.  IMPRESSION: 1. Choledocholithiasis with associated intra and extrahepatic biliary dilatation. No ampullary mass identified. Consider further evaluation with ERCP. 2. Mild peripancreatic soft tissue stranding consistent with mild acute interstitial pancreatitis. No significant pancreatic ductal dilatation, focal peripancreatic fluid collection or evidence of pancreatic necrosis. 3. No other significant findings.  Electronically Signed   By: Carey Bullocks M.D.   On: 06/23/2023 16:18        Iva Boop, MD, [facility] See Loretha Stapler on call - gastroenterology for best contact person 06/25/2023 10:01 AM

## 2023-06-25 NOTE — Transfer of Care (Signed)
Immediate Anesthesia Transfer of Care Note  Patient: Jade Hernandez  Procedure(s) Performed: ENDOSCOPIC RETROGRADE CHOLANGIOPANCREATOGRAPHY (ERCP) SPHINCTEROTOMY REMOVAL OF STONES  Patient Location: PACU  Anesthesia Type:General  Level of Consciousness: awake  Airway & Oxygen Therapy: Patient Spontanous Breathing  Post-op Assessment: Report given to RN and Post -op Vital signs reviewed and stable  Post vital signs: Reviewed and stable  Last Vitals:  Vitals Value Taken Time  BP 119/62 06/25/23 1315  Temp 36.4 C 06/25/23 1315  Pulse 72 06/25/23 1329  Resp 15 06/25/23 1329  SpO2 94 % 06/25/23 1329  Vitals shown include unfiled device data.  Last Pain:  Vitals:   06/25/23 1315  TempSrc:   PainSc: 0-No pain         Complications: No notable events documented.

## 2023-06-25 NOTE — Anesthesia Postprocedure Evaluation (Signed)
Anesthesia Post Note  Patient: Jade Hernandez  Procedure(s) Performed: ENDOSCOPIC RETROGRADE CHOLANGIOPANCREATOGRAPHY (ERCP) SPHINCTEROTOMY REMOVAL OF STONES     Patient location during evaluation: PACU Anesthesia Type: General Level of consciousness: awake and alert Pain management: pain level controlled Vital Signs Assessment: post-procedure vital signs reviewed and stable Respiratory status: spontaneous breathing, nonlabored ventilation and respiratory function stable Cardiovascular status: stable and blood pressure returned to baseline Anesthetic complications: no   No notable events documented.  Last Vitals:  Vitals:   06/25/23 1345 06/25/23 1413  BP: 116/68 101/85  Pulse:  75  Resp:  18  Temp: 36.7 C 36.8 C  SpO2:  95%    Last Pain:  Vitals:   06/25/23 1413  TempSrc: Oral  PainSc:                  Beryle Lathe

## 2023-06-25 NOTE — Interval H&P Note (Signed)
History and Physical Interval Note:  06/25/2023 12:07 PM  Jade Hernandez  has presented today for surgery, with the diagnosis of Choledocholithiasis.  The various methods of treatment have been discussed with the patient and family. After consideration of risks, benefits and other options for treatment, the patient has consented to  Procedure(s): ENDOSCOPIC RETROGRADE CHOLANGIOPANCREATOGRAPHY (ERCP) (N/A) as a surgical intervention.  The patient's history has been reviewed, patient examined, no change in status, stable for surgery.  I have reviewed the patient's chart and labs.  Questions were answered to the patient's satisfaction.     The risks of an ERCP were discussed at length, including but not limited to the risk of perforation, bleeding, abdominal pain, post-ERCP pancreatitis (while usually mild can be severe and even life threatening).    [employer]

## 2023-06-25 NOTE — Progress Notes (Signed)
Patient Name: Jade Hernandez Date of Encounter: 06/25/2023, 10:01 AM     Assessment and Plan  Gallstone pancreatitis +Choledocholithiasis  Periampullary diverticulum  Crohn's disease of small and large intestine on Entyvio  Hypokalemia --------------------------------------------------------------------------------------------------------  She is improving  ERCP today hopefully  [facility] consult after ERCP likely  I have stopped Lovenox and started SCD's in anticipation of procedure tomorrow Labs ordered also  Iva Boop, MD, [facility] See AMION on call - gastroenterology for best contact person 06/25/2023 10:01 AM    Subjective  OK - nop pain   Objective  BP 134/78 (BP Location: Left Arm)   Pulse 75   Temp 98.7 F (37.1 C) (Oral)   Resp 16   Ht 5\' 3"  (1.6 m)   Wt 77.7 kg   SpO2 97%   BMI 30.34 kg/m  NAD  Abd soft NT  Recent Labs  Lab 06/19/23 0541 06/20/23 0615 06/22/23 2338 06/23/23 2355 06/25/23 0044  AST 16 15 88* 150* 47*  ALT 13 11 30  147* 86*  ALKPHOS 50 51 97 116 99  BILITOT 1.3* 0.9 1.1 1.7* 0.7  PROT 5.4* 5.6* 6.7 5.3* 5.1*  ALBUMIN 2.6* 2.9* 3.9 2.6* 2.4*    Lab Results  Component Value Date   LIPASE 446 (H) 06/25/2023   Recent Labs  Lab 06/23/23 1356 06/23/23 2355 06/25/23 0044  HGB 11.8* 10.2* 9.5*  HCT 38.5 32.1* 31.6*  WBC 9.1 6.3 4.4  PLT 222 199 175    Lab Results  Component Value Date   FERRITIN 110 06/23/2023   Recent Labs  Lab 06/18/23 2127 06/19/23 0541 06/20/23 0615 06/21/23 0457 06/22/23 2338 06/23/23 1356 06/23/23 2355 06/25/23 0044  NA 138 137 138 138 139  --  135 136  K 4.3 3.3* 3.6 3.8 3.7  --  3.2* 3.1*  CL 106 108 110 108 107  --  105 107  CO2 24 23 21* 20* 23  --  22 22  GLUCOSE 113* 98 101* 105* 141*  --  97 101*  BUN 17 17 7* 11 18  --  10 <5*  CREATININE 0.51 0.47 0.46 0.46 0.57 0.47 0.45 0.47  CALCIUM 8.9 8.3* 8.5* 8.9 9.8  --  8.5* 8.4*  MG 2.0 2.0  --    --   --   --  1.8  --   PHOS 3.7 2.8  --   --   --   --  2.5  --     Images reviewed MR 3D Recon At Scanner CLINICAL DATA:  Acute pancreatitis. Biliary dilatation on abdominal CT. History of Crohn's disease and ureteral bowel syndrome.  EXAM: MRI ABDOMEN WITHOUT AND WITH CONTRAST (INCLUDING MRCP)  TECHNIQUE: Multiplanar multisequence MR imaging of the abdomen was performed both before and after the administration of intravenous contrast. Heavily T2-weighted images of the biliary and pancreatic ducts were obtained, and three-dimensional MRCP images were rendered by post processing.  CONTRAST:  7.69mL GADAVIST GADOBUTROL 1 MMOL/ML IV SOLN  COMPARISON:  Abdominopelvic CT 06/18/2023 and 07/22/2020.  FINDINGS: Lower chest: No acute findings are seen in the lower chest. There is similar linear atelectasis or scarring at both lung bases.  Hepatobiliary: The liver has a non cirrhotic morphology and shows no significant steatosis or focal abnormality. No significant gallbladder distension, wall thickening or surrounding inflammation. No definite gallstones are seen, although there are several small calculi within the common bile duct, most obvious on coronal image 22 of series 2. As seen  on recent CT, there is intra and extrahepatic biliary dilatation which is new from the CT of 07/22/2020. The common hepatic duct measures 1.4 cm in diameter, and the common bile duct 1.3 cm. The duct tapers distally. No ampullary mass is identified. 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  Pancreas: Mild peripancreatic soft tissue stranding consistent with mild acute interstitial pancreatitis. No focal peripancreatic fluid collection or pancreatic ductal dilatation. The pancreas enhances homogeneously following contrast. No evidence of pancreatic necrosis.  Spleen: Normal in size without focal  abnormality.  Adrenals/Urinary Tract: Both adrenal glands appear normal. No evidence of hydronephrosis or suspicious renal lesion. There are small renal cortical and parapelvic cysts bilaterally for which no specific follow-up imaging is recommended.  Stomach/Bowel: The stomach appears unremarkable for its degree of distension. No evidence of bowel wall thickening, distention or surrounding inflammatory change.  Vascular/Lymphatic: There are no enlarged abdominal lymph nodes. Aortic and branch vessel atherosclerosis better seen on CT. No evidence of aneurysm or large vessel occlusion. The portal, superior mesenteric and splenic veins are patent.  Other: No evidence of abdominal wall hernia, ascites or focal extraluminal fluid collection.  Musculoskeletal: No acute or significant osseous findings. Multilevel spondylosis associated with a mild scoliosis.  Unless specific follow-up recommendations are mentioned in the findings or impression sections, no imaging follow-up of any mentioned incidental findings is recommended.  IMPRESSION: 1. Choledocholithiasis with associated intra and extrahepatic biliary dilatation. No ampullary mass identified. Consider further evaluation with ERCP. 2. Mild peripancreatic soft tissue stranding consistent with mild acute interstitial pancreatitis. No significant pancreatic ductal dilatation, focal peripancreatic fluid collection or evidence of pancreatic necrosis. 3. No other significant findings.  Electronically Signed   By: Carey Bullocks M.D.   On: 06/23/2023 16:18 MR ABDOMEN MRCP W WO CONTAST CLINICAL DATA:  Acute pancreatitis. Biliary dilatation on abdominal CT. History of Crohn's disease and ureteral bowel syndrome.  EXAM: MRI ABDOMEN WITHOUT AND WITH CONTRAST (INCLUDING MRCP)  TECHNIQUE: Multiplanar multisequence MR imaging of the abdomen was performed both before and after the administration of intravenous contrast. Heavily  T2-weighted images of the biliary and pancreatic ducts were obtained, and three-dimensional MRCP images were rendered by post processing.  CONTRAST:  7.58mL GADAVIST GADOBUTROL 1 MMOL/ML IV SOLN  COMPARISON:  Abdominopelvic CT 06/18/2023 and 07/22/2020.  FINDINGS: Lower chest: No acute findings are seen in the lower chest. There is similar linear atelectasis or scarring at both lung bases.  Hepatobiliary: The liver has a non cirrhotic morphology and shows no significant steatosis or focal abnormality. No significant gallbladder distension, wall thickening or surrounding inflammation. No definite gallstones are seen, although there are several small calculi within the common bile duct, most obvious on coronal image 22 of series 2. As seen on recent CT, there is intra and extrahepatic biliary dilatation which is new from the CT of 07/22/2020. The common hepatic duct measures 1.4 cm in diameter, and the common bile duct 1.3 cm. The duct tapers distally. No ampullary mass is identified. 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  Pancreas: Mild peripancreatic soft tissue stranding consistent with mild acute interstitial pancreatitis. No focal peripancreatic fluid collection or pancreatic ductal dilatation. The pancreas enhances homogeneously following contrast. No evidence of pancreatic necrosis.  Spleen: Normal in size without focal abnormality.  Adrenals/Urinary Tract: Both adrenal glands appear normal. No evidence of hydronephrosis or suspicious renal  lesion. There are small renal cortical and parapelvic cysts bilaterally for which no specific follow-up imaging is recommended.  Stomach/Bowel: The stomach appears unremarkable for its degree of distension. No evidence of bowel wall thickening, distention or surrounding inflammatory change.  Vascular/Lymphatic: There are no enlarged abdominal lymph  nodes. Aortic and branch vessel atherosclerosis better seen on CT. No evidence of aneurysm or large vessel occlusion. The portal, superior mesenteric and splenic veins are patent.  Other: No evidence of abdominal wall hernia, ascites or focal extraluminal fluid collection.  Musculoskeletal: No acute or significant osseous findings. Multilevel spondylosis associated with a mild scoliosis.  Unless specific follow-up recommendations are mentioned in the findings or impression sections, no imaging follow-up of any mentioned incidental findings is recommended.  IMPRESSION: 1. Choledocholithiasis with associated intra and extrahepatic biliary dilatation. No ampullary mass identified. Consider further evaluation with ERCP. 2. Mild peripancreatic soft tissue stranding consistent with mild acute interstitial pancreatitis. No significant pancreatic ductal dilatation, focal peripancreatic fluid collection or evidence of pancreatic necrosis. 3. No other significant findings.  Electronically Signed   By: Carey Bullocks M.D.   On: 06/23/2023 16:18        Iva Boop, MD, [facility] See Loretha Stapler on call - gastroenterology for best contact person 06/25/2023 10:01 AM

## 2023-06-25 NOTE — Consult Note (Signed)
Surgical Evaluation Requesting provider: Dr. Meridee Score  Chief Complaint: choledocholithiasis  HPI: Very pleasant 82 year old woman with history of Crohn's disease on entyvio, diverticulosis, hypertension, arthritis, prediabetes who has been struggling with gallstone pancreatitis for about a week.  She was actually admitted at [facility] about a week ago and with improving symptoms was discharged with outpatient follow-up/follow-up MRCP.  Her admission CT at that time showed pancreatitis along with intra and extrahepatic biliary ductal dilatation with the common bile duct measuring up to 17 mm.  The biliary dilatation was thought to be secondary to pancreatic inflammation. Her pain returned and she represented on 8/7 with recurrent pain associated with emesis.  MRCP 8/8 shows choledocholithiasis, no ampullary mass, of note there was no significant gallbladder distention, wall thickening or inflammation and there were no definite gallstones seen in the gallbladder.  Mild peripancreatic soft tissue stranding also noted, no pancreatic ductal dilatation or fluid collection or evidence of necrosis.  Lipase was 8565 on presentation, down to 446 today.  Currently she denies any abdominal pain.  She just transition to solid foods and seems to be tolerating that well so far.  She denies any previous abdominal surgeries.  Allergies  Allergen Reactions   Feraheme [Ferumoxytol] Shortness Of Breath and Palpitations    Severe pain all over    Past Medical History:  Diagnosis Date   Adenomatous colon polyp    Arthritis    Crohn's disease of both small and large intestine with other complication (HCC) 10/23/2020   Diverticulosis    High cholesterol    Hypertension    IBS (irritable bowel syndrome)    Palpitations    Pre-diabetes    UTI (urinary tract infection)     Past Surgical History:  Procedure Laterality Date   BILATERAL CARPAL TUNNEL RELEASE     COLONOSCOPY     NM MYOCAR PERF WALL MOTION   10/02/2007   No significant ischemia demonstrated   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    Family History  Problem Relation Age of Onset   Hypertension Mother    Diabetes Mother    Heart disease Father        died from heart attack   Heart disease Brother    Colon cancer Neg Hx    Rectal cancer Neg Hx    Liver cancer Neg Hx    Stomach cancer Neg Hx     Social History   Socioeconomic History   Marital status: Married    Spouse name: Not on file   Number of children: 4   Years of education: Not on file   Highest education level: Not on file  Occupational History   Occupation: retired  Tobacco Use   Smoking status: Never   Smokeless tobacco: Never  Vaping Use   Vaping status: Never Used  Substance and Sexual Activity   Alcohol use: Yes    Alcohol/week: 1.0 standard drink of alcohol    Types: 1 Glasses of wine per week    Comment: Rare   Drug use: No  Sexual activity: Not on file  Other Topics Concern   Not on file  Social History Narrative   The patient is married she is originally from [place]   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 [employer]   She does have children 2 daughters 2 sons multiple grandchildren   Rare alcohol no tobacco or drug use   Social Determinants of Corporate investment banker Strain: Not on file  Food Insecurity: No Food Insecurity (06/23/2023)   Hunger Vital Sign    Worried About Running Out of Food in the Last Year: Never true    Ran Out of Food in the Last  Year: Never true  Transportation Needs: No Transportation Needs (06/23/2023)   PRAPARE - Administrator, Civil Service (Medical): No    Lack of Transportation (Non-Medical): No  Physical Activity: Not on file  Stress: Not on file  Social Connections: Not on file    No current facility-administered medications on file prior to encounter.   Current Outpatient Medications on File Prior to Encounter  Medication Sig Dispense Refill   acetaminophen (TYLENOL) 500 MG tablet Take 1,000 mg by mouth in the morning and at bedtime.     amLODipine (NORVASC) 2.5 MG tablet Take 1 tablet (2.5 mg total) by mouth daily. 90 tablet 3   cholecalciferol (VITAMIN D3) 25 MCG (1000 UNIT) tablet Take 1,000 Units by mouth daily.     metoprolol succinate (TOPROL-XL) 100 MG 24 hr tablet TAKE 1 TABLET(100 MG) BY MOUTH DAILY WITH OR IMMEDIATELY FOLLOWING A MEAL (Patient taking differently: Take 100 mg by mouth daily.) 90 tablet 3   Probiotic Product (PROBIOTIC DAILY PO) Take 1 capsule by mouth in the morning.     valsartan (DIOVAN) 160 MG tablet Take 1 tablet (160 mg total) by mouth daily. 90 tablet 3    Review of Systems: a complete, 10pt review of systems was completed with pertinent positives and negatives as documented in the HPI  Physical Exam: Vitals:   06/25/23 1345 06/25/23 1413  BP: 116/68 101/85  Pulse:  75  Resp:  18  Temp: 98 F (36.7 C) 98.2 F (36.8 C)  SpO2:  95%   Gen: A&Ox3, no distress  Eyes: lids and conjunctivae normal, no icterus. Pupils equally round and reactive to light.  Chest: respiratory effort is normal.   Cardiovascular: RRR with palpable distal pulses, no pedal edema Gastrointestinal: soft, mildly distended, nontender. No mass, hepatomegaly or splenomegaly.  Muscoloskeletal: no clubbing or cyanosis of the fingers.  Moves all extremities Neuro: cranial nerves grossly intact.  Sensation intact to light touch diffusely. Psych: appropriate mood and affect, normal  insight/judgment intact  Skin: warm and dry      Latest Ref Rng & Units 06/25/2023   12:44 AM 06/23/2023   11:55 PM 06/23/2023    1:56 PM  CBC  WBC 4.0 - 10.5 K/uL 4.4  6.3  9.1   Hemoglobin 12.0 - 15.0 g/dL 9.5  50.9  32.6   Hematocrit 36.0 - 46.0 % 31.6  32.1  38.5   Platelets 150 - 400 K/uL 175  199  222        Latest Ref Rng & Units 06/25/2023   12:44 AM 06/23/2023   11:55 PM 06/23/2023    1:56 PM  CMP  Glucose 70 - 99 mg/dL 712  97    BUN 8 - 23 mg/dL <5  10    Creatinine 4.58 - 1.00 mg/dL 0.99  8.33  8.25   Sodium  135 - 145 mmol/L 136  135    Potassium 3.5 - 5.1 mmol/L 3.1  3.2    Chloride 98 - 111 mmol/L 107  105    CO2 22 - 32 mmol/L 22  22    Calcium 8.9 - 10.3 mg/dL 8.4  8.5    Total Protein 6.5 - 8.1 g/dL 5.1  5.3    Total Bilirubin 0.3 - 1.2 mg/dL 0.7  1.7    Alkaline Phos 38 - 126 U/L 99  116    AST 15 - 41 U/L 47  150    ALT 0 - 44 U/L 86  147      Lab Results  Component Value Date   INR 1.00 02/15/2017    Imaging: MR ABDOMEN MRCP W WO CONTAST  Result Date: 06/23/2023 CLINICAL DATA:  Acute pancreatitis. Biliary dilatation on abdominal CT. History of Crohn's disease and ureteral bowel syndrome. EXAM: MRI ABDOMEN WITHOUT AND WITH CONTRAST (INCLUDING MRCP) TECHNIQUE: Multiplanar multisequence MR imaging of the abdomen was performed both before and after the administration of intravenous contrast. Heavily T2-weighted images of the biliary and pancreatic ducts were obtained, and three-dimensional MRCP images were rendered by post processing. CONTRAST:  7.45mL GADAVIST GADOBUTROL 1 MMOL/ML IV SOLN COMPARISON:  Abdominopelvic CT 06/18/2023 and 07/22/2020. FINDINGS: Lower chest: No acute findings are seen in the lower chest. There is similar linear atelectasis or scarring at both lung bases. Hepatobiliary: The liver has a non cirrhotic morphology and shows no significant steatosis or focal abnormality. No significant gallbladder distension, wall thickening or surrounding  inflammation. No definite gallstones are seen, although there are several small calculi within the common bile duct, most obvious on coronal image 22 of series 2. As seen on recent CT, there is intra and extrahepatic biliary dilatation which is new from the CT of 07/22/2020. The common hepatic duct measures 1.4 cm in diameter, and the common bile duct 1.3 cm. The duct tapers distally. No ampullary mass is identified. 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 Pancreas: Mild peripancreatic soft tissue stranding consistent with mild acute interstitial pancreatitis. No focal peripancreatic fluid collection or pancreatic ductal dilatation. The pancreas enhances homogeneously following contrast. No evidence of pancreatic necrosis. Spleen: Normal in size without focal abnormality. Adrenals/Urinary Tract: Both adrenal glands appear normal. No evidence of hydronephrosis or suspicious renal lesion. There are small renal cortical and parapelvic cysts bilaterally for which no specific follow-up imaging is recommended. Stomach/Bowel: The stomach appears unremarkable for its degree of distension. No evidence of bowel wall thickening, distention or surrounding inflammatory change. Vascular/Lymphatic: There are no enlarged abdominal lymph nodes. Aortic and branch vessel atherosclerosis better seen on CT. No evidence of aneurysm or large vessel occlusion. The portal, superior mesenteric and splenic veins are patent. Other: No evidence of abdominal wall hernia, ascites or focal extraluminal fluid collection. Musculoskeletal: No acute or significant osseous findings. Multilevel spondylosis associated with a mild scoliosis. Unless specific follow-up recommendations are mentioned in the findings or impression sections, no imaging follow-up of any mentioned incidental findings is recommended. IMPRESSION: 1. Choledocholithiasis with associated intra and  extrahepatic biliary dilatation. No ampullary mass identified. Consider further evaluation with ERCP. 2. Mild peripancreatic soft tissue stranding consistent with mild acute interstitial pancreatitis. No significant pancreatic ductal dilatation, focal peripancreatic fluid collection or evidence of pancreatic necrosis. 3. No other significant findings. Electronically Signed   By: Carey Bullocks M.D.   On: 06/23/2023 16:18  MR 3D Recon At Scanner  Result Date: 06/23/2023 CLINICAL DATA:  Acute pancreatitis. Biliary dilatation on abdominal CT. History of Crohn's disease and ureteral bowel syndrome. EXAM: MRI ABDOMEN WITHOUT AND WITH CONTRAST (INCLUDING MRCP) TECHNIQUE: Multiplanar multisequence MR imaging of the abdomen was performed both before and after the administration of intravenous contrast. Heavily T2-weighted images of the biliary and pancreatic ducts were obtained, and three-dimensional MRCP images were rendered by post processing. CONTRAST:  7.73mL GADAVIST GADOBUTROL 1 MMOL/ML IV SOLN COMPARISON:  Abdominopelvic CT 06/18/2023 and 07/22/2020. FINDINGS: Lower chest: No acute findings are seen in the lower chest. There is similar linear atelectasis or scarring at both lung bases. Hepatobiliary: The liver has a non cirrhotic morphology and shows no significant steatosis or focal abnormality. No significant gallbladder distension, wall thickening or surrounding inflammation. No definite gallstones are seen, although there are several small calculi within the common bile duct, most obvious on coronal image 22 of series 2. As seen on recent CT, there is intra and extrahepatic biliary dilatation which is new from the CT of 07/22/2020. The common hepatic duct measures 1.4 cm in diameter, and the common bile duct 1.3 cm. The duct tapers distally. No ampullary mass is identified. 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 Pancreas:  Mild peripancreatic soft tissue stranding consistent with mild acute interstitial pancreatitis. No focal peripancreatic fluid collection or pancreatic ductal dilatation. The pancreas enhances homogeneously following contrast. No evidence of pancreatic necrosis. Spleen: Normal in size without focal abnormality. Adrenals/Urinary Tract: Both adrenal glands appear normal. No evidence of hydronephrosis or suspicious renal lesion. There are small renal cortical and parapelvic cysts bilaterally for which no specific follow-up imaging is recommended. Stomach/Bowel: The stomach appears unremarkable for its degree of distension. No evidence of bowel wall thickening, distention or surrounding inflammatory change. Vascular/Lymphatic: There are no enlarged abdominal lymph nodes. Aortic and branch vessel atherosclerosis better seen on CT. No evidence of aneurysm or large vessel occlusion. The portal, superior mesenteric and splenic veins are patent. Other: No evidence of abdominal wall hernia, ascites or focal extraluminal fluid collection. Musculoskeletal: No acute or significant osseous findings. Multilevel spondylosis associated with a mild scoliosis. Unless specific follow-up recommendations are mentioned in the findings or impression sections, no imaging follow-up of any mentioned incidental findings is recommended. IMPRESSION: 1. Choledocholithiasis with associated intra and extrahepatic biliary dilatation. No ampullary mass identified. Consider further evaluation with ERCP. 2. Mild peripancreatic soft tissue stranding consistent with mild acute interstitial pancreatitis. No significant pancreatic ductal dilatation, focal peripancreatic fluid collection or evidence of pancreatic necrosis. 3. No other significant findings. Electronically Signed   By: Carey Bullocks M.D.   On: 06/23/2023 16:18     A/P: 82 year old woman who is recovering from gallstone pancreatitis and is now postop day 0 status post successful ERCP.  The  gallbladder/cystic duct did not opacify, an 8 mm biliary sphincterotomy was made and balloon sweep retrieved sludge as well as 2 stones.  I met with the patient as well as her spouse and 2 sons at the bedside this afternoon.  I went over the relevant anatomy and pathology, and we discussed that standard practice is to proceed with cholecystectomy prior to discharge in order to prevent future episodes of choledocholithiasis/cholangitis/pancreatitis.  I went over the surgical technique with them and relevant anatomy, and discussed risks of surgery including bleeding, pain, scarring, intraabdominal injury specifically to the common bile duct and sequelae, bile leak, conversion  to open surgery, subtotal cholecystectomy, blood clot, pneumonia, heart attack, stroke, failure to resolve symptoms, etc.  We discussed the alternative of not having surgery which carries high risk of recurrent choledocholithiasis or pancreatitis in patients who have residual gallstones.  Discussed that in some instances surgery can be deferred for a few weeks but that it is overall recommended. She would like to discuss further with her family.  Given that she did not have any notable gallstones on her MRCP, I offered to proceed with a right upper quadrant ultrasound to evaluate for any remaining cholelithiasis.  If there is no appreciable cholelithiasis or sludge on her ultrasound,  I would feel more comfortable with her deferring surgery which after our first conversation seems to be her hope. Questions welcomed and answered to the patient and family satisfaction.     Patient Active Problem List   Diagnosis Date Noted   Choledocholithiasis 06/25/2023   Acute pancreatitis 06/23/2023   Dilated bile duct 06/20/2023   Pancreatitis 06/18/2023   Duodenitis 06/18/2023   Primary osteoarthritis of right knee 03/14/2023   Long-term use of immunosuppressant medication _ Entyvio 02/16/2021   Iron deficiency anemia due to chronic blood  loss 11/25/2020   Crohn's disease of both small and large intestine with other complication (HCC) 10/23/2020   OA (osteoarthritis) of knee 02/21/2017   History of palpitations 04/22/2015   Hyperlipidemia 01/11/2014   GERD (gastroesophageal reflux disease) 01/11/2014   Family history of coronary artery disease 01/11/2014   Osteoarthritis of knee 06/17/2012   Hypertension        Phylliss Blakes, MD [facility]  See AMION to contact appropriate on-call provider   Mdm- high

## 2023-06-25 NOTE — Anesthesia Procedure Notes (Signed)
Procedure Name: Intubation Date/Time: 06/25/2023 12:21 PM  Performed by: Loleta , CRNAPre-anesthesia Checklist: Patient identified, Patient being monitored, Timeout performed, Emergency Drugs available and Suction available Patient Re-evaluated:Patient Re-evaluated prior to induction Oxygen Delivery Method: Circle system utilized Preoxygenation: Pre-oxygenation with 100% oxygen Induction Type: IV induction Ventilation: Mask ventilation without difficulty Laryngoscope Size: Mac and 4 Grade View: Grade I Tube type: Oral Tube size: 7.0 mm Number of attempts: 1 Airway Equipment and Method: Stylet Placement Confirmation: ETT inserted through vocal cords under direct vision, positive ETCO2 and breath sounds checked- equal and bilateral Secured at: 21 cm Tube secured with: Tape Dental Injury: Teeth and Oropharynx as per pre-operative assessment

## 2023-06-25 NOTE — Anesthesia Preprocedure Evaluation (Addendum)
Anesthesia Evaluation  Patient identified by MRN, date of birth, ID band Patient awake    Reviewed: Allergy & Precautions, NPO status , Patient's Chart, lab work & pertinent test results, reviewed documented beta blocker date and time   History of Anesthesia Complications Negative for: history of anesthetic complications  Airway Mallampati: II  TM Distance: >3 FB Neck ROM: Full    Dental  (+) Dental Advisory Given, Lower Dentures   Pulmonary neg pulmonary ROS   Pulmonary exam normal        Cardiovascular hypertension, Pt. on medications and Pt. on home beta blockers Normal cardiovascular exam     Neuro/Psych negative neurological ROS  negative psych ROS   GI/Hepatic Neg liver ROS,GERD  Controlled,, Crohn's disease    Endo/Other   Pre-DM K 3.1 Obesity   Renal/GU negative Renal ROS     Musculoskeletal  (+) Arthritis , Osteoarthritis,    Abdominal   Peds  Hematology  (+) Blood dyscrasia, anemia   Anesthesia Other Findings   Reproductive/Obstetrics                             Anesthesia Physical Anesthesia Plan  ASA: 2  Anesthesia Plan: General   Post-op Pain Management: Minimal or no pain anticipated   Induction: Intravenous  PONV Risk Score and Plan: 3 and Treatment may vary due to age or medical condition, Ondansetron and Propofol infusion  Airway Management Planned: Oral ETT  Additional Equipment: None  Intra-op Plan:   Post-operative Plan: Extubation in OR  Informed Consent: I have reviewed the patients History and Physical, chart, labs and discussed the procedure including the risks, benefits and alternatives for the proposed anesthesia with the patient or authorized representative who has indicated his/her understanding and acceptance.     Dental advisory given  Plan Discussed with: CRNA and Anesthesiologist  Anesthesia Plan Comments:        Anesthesia  Quick Evaluation

## 2023-06-25 NOTE — Progress Notes (Signed)
**Note De-Identified vi Obfusction** PROGRESS NOTE    Jade Hernandez  ZOX:[id number] DOB: 1941/07/31 DOA: 06/22/2023 PCP: Lupit Rider, MD  Chief Complint  Ptient presents with   Abdominl Pin    Brief Nrrtive:   Jade Hernandez is  82 y.o. femle with PMH significnt for Crohn's disese, hypertension,  Hyperlipidemi,  irritble bowel syndrome presented in the ED with complints of bdominl pin rditing towrds the bck.   Admitted with pncretitis.   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 Ptient is tking Entyvio every 8 weeks.   Bilterl LE [place] Echo 01/2023 with norml EF, norml LV distolic prmeters Follow BNP mildly elevted, follow UA (negtive protein).  Albumin is 2.4. Hold IVF for now  Essentil hypertension: mlodipine nd metoprolol  Microcytic Anemi Trend Anemi lbs     DVT prophylxis: SCD Code Sttus: full Fmily Communiction: none Disposition:   Sttus is: Inptient Remins inptient pproprite becuse: need for continued inptient cre   Consultnts:  GI  Procedures:  none  Antimicrobils:  Anti-infectives (From dmission, onwrd)    None       Subjective: No complints  Objective: Vitls:   06/24/23 1634 06/24/23 2129 06/25/23 0441 06/25/23 0731  BP: 138/81 133/67 137/79 134/78  Pulse: 79 72 77 75   Resp: 16 18 18 16   Temp: 99.2 F (37.3 C) 98 F (36.7 C) 98.9 F (37.2 C) 98.7 F (37.1 C)  TempSrc: Orl Orl Orl Orl  SpO2: 100% 98% 100% 97%  Weight:      Height:        Intke/Output Summry (Lst 24 hours) t 06/25/2023 0844 Lst dt filed t 06/24/2023 2018 Gross per 24 hour  Intke 864.16 ml  Output --  Net 864.16 ml   Filed Weights   06/22/23 2324  Weight: 77.7 kg    Exmintion:  Generl: No cute distress. Crdiovsculr: RRR Lungs: unlbored Abdomen: Soft, nontender, nondistended Neurologicl: Alert nd oriented 3. Moves ll extremities 4 with equl strength. Crnil nerves II through XII grossly intct. Extremities: 1+ edem   Dt Reviewed: I hve personlly reviewed following lbs nd imging studies  CBC: Recent Lbs  Lb 06/18/23 1351 06/19/23 0541 06/20/23 0615 06/22/23 2338 06/23/23 1356 06/23/23 2355 06/25/23 0044  WBC 9.4   < > 4.7 9.5 9.1 6.3 4.4  NEUTROABS 8.3*  --   --   --   --   --   --   HGB 13.0   < > 10.5* 11.9* 11.8* 10.2* 9.5*  HCT 41.4   < > 33.6* 38.1 38.5 32.1* 31.6*  MCV 78.7*   < > 80.6 78.2* 79.2* 79.3* 79.2*  PLT 151   < > 179 262 222 199 175   < > = vlues in this intervl not displyed.    Bsic Metbolic Pnel: Recent Lbs  Lb 06/18/23 2127 06/19/23 0541 06/20/23 0615 06/21/23 0457 06/22/23 2338 06/23/23 1356 06/23/23 2355 **Note De-Identified vi Obfusction** 06/25/23 0044  NA 138 137 138 138 139  --  135 136  K 4.3 3.3* 3.6 3.8 3.7  --  3.2* 3.1*  CL 106 108 110 108 107  --  105 107  CO2 24 23 21* 20* 23  --  22 22  GLUCOSE 113* 98 101* 105* 141*  --  97 101*  BUN 17 17 7* 11 18  --  10 <5*  CREATININE 0.51 0.47 0.46 0.46 0.57 0.47 0.45 0.47  CALCIUM 8.9 8.3* 8.5* 8.9 9.8  --  8.5* 8.4*  MG 2.0 2.0  --   --   --   --  1.8  --   PHOS 3.7 2.8  --   --   --   --  2.5  --     GFR: Estimted Cretinine Clernce: 55.3 mL/min (by [employer] formul bsed on SCr of 0.47 mg/dL).  Liver Function Tests: Recent Lbs  Lb 06/19/23 0541  06/20/23 0615 06/22/23 2338 06/23/23 2355 06/25/23 0044  AST 16 15 88* 150* 47*  ALT 13 11 30  147* 86*  ALKPHOS 50 51 97 116 99  BILITOT 1.3* 0.9 1.1 1.7* 0.7  PROT 5.4* 5.6* 6.7 5.3* 5.1*  ALBUMIN 2.6* 2.9* 3.9 2.6* 2.4*    CBG: No results for input(s): "GLUCAP" in the lst 168 hours.   No results found for this or ny previous visit (from the pst 240 hour(s)).       Rdiology Studies: MR ABDOMEN MRCP W WO CONTAST  Result Dte: 06/23/2023 CLINICAL DATA:  Acute pncretitis. Biliry dilttion on bdominl CT. History of Crohn's disese nd ureterl bowel syndrome. EXAM: MRI ABDOMEN WITHOUT AND WITH CONTRAST (INCLUDING MRCP) TECHNIQUE: Multiplnr multisequence MR imging of the bdomen ws performed both before nd fter the dministrtion of intrvenous contrst. Hevily T2-weighted imges of the biliry nd pncretic ducts were obtined, nd three-dimensionl MRCP imges were rendered by post processing. CONTRAST:  7.65mL GADAVIST GADOBUTROL 1 MMOL/ML IV SOLN COMPARISON:  Abdominopelvic CT 06/18/2023 nd 07/22/2020. FINDINGS: Lower chest: No cute findings re seen in the lower chest. There is similr liner telectsis or scrring t both lung bses. Heptobiliry: The liver hs  non cirrhotic morphology nd shows no significnt stetosis or focl bnormlity. No significnt gllbldder distension, wll thickening or surrounding inflmmtion. No definite gllstones re seen, lthough there re severl smll clculi within the common bile duct, most obvious on coronl imge 22 of series 2. As seen on recent CT, there is intr nd extrheptic biliry dilttion which is new from the CT of 07/22/2020. The common heptic duct mesures 1.4 cm in dimeter, nd the common bile duct 1.3 cm. The duct tpers distlly. No mpullry mss is identified. 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 Pncres: Mild  peripncretic soft tissue strnding consistent with mild cute interstitil pncretitis. No focl peripncretic fluid collection or pncretic ductl dilttion. The pncres enhnces homogeneously following contrst. No evidence of pncretic necrosis. Spleen: Norml in size without focl bnormlity. Adrenls/Urinry Trct: Both drenl glnds pper norml. No evidence of hydronephrosis or suspicious renl lesion. There re smll renl corticl nd prpelvic cysts bilterlly for which no specific follow-up imging is recommended. Stomch/Bowel: The stomch ppers unremrkble for its degree of distension. No evidence of bowel wll thickening, distention or surrounding inflmmtory chnge. Vsculr/Lymphtic: There re no enlrged bdominl lymph nodes. Aortic nd brnch vessel therosclerosis better seen on CT. No evidence of neurysm or lrge vessel occlusion. **Note De-Identified vi Obfusction** The portl, superior mesenteric nd splenic veins re ptent. Other: No evidence of bdominl wll herni, scites or focl extrluminl fluid collection. Musculoskeletl: No cute or significnt osseous findings. Multilevel spondylosis ssocited with  mild scoliosis. Unless specific follow-up recommendtions re mentioned in the findings or impression sections, no imging follow-up of ny mentioned incidentl findings is recommended. IMPRESSION: 1. Choledocholithisis with ssocited intr nd extrheptic biliry dilttion. No mpullry mss identified. Consider further evlution with ERCP. 2. Mild peripncretic soft tissue strnding consistent with mild cute interstitil pncretitis. No significnt pncretic ductl dilttion, focl peripncretic fluid collection or evidence of pncretic necrosis. 3. No other significnt findings. Electroniclly Signed   By: Crey Bullocks M.D.   On: 06/23/2023 16:18   MR 3D Recon At Scnner  Result Dte: 06/23/2023 CLINICAL DATA:  Acute pncretitis. Biliry dilttion on bdominl CT. History of  Crohn's disese nd ureterl bowel syndrome. EXAM: MRI ABDOMEN WITHOUT AND WITH CONTRAST (INCLUDING MRCP) TECHNIQUE: Multiplnr multisequence MR imging of the bdomen ws performed both before nd fter the dministrtion of intrvenous contrst. Hevily T2-weighted imges of the biliry nd pncretic ducts were obtined, nd three-dimensionl MRCP imges were rendered by post processing. CONTRAST:  7.58mL GADAVIST GADOBUTROL 1 MMOL/ML IV SOLN COMPARISON:  Abdominopelvic CT 06/18/2023 nd 07/22/2020. FINDINGS: Lower chest: No cute findings re seen in the lower chest. There is similr liner telectsis or scrring t both lung bses. Heptobiliry: The liver hs  non cirrhotic morphology nd shows no significnt stetosis or focl bnormlity. No significnt gllbldder distension, wll thickening or surrounding inflmmtion. No definite gllstones re seen, lthough there re severl smll clculi within the common bile duct, most obvious on coronl imge 22 of series 2. As seen on recent CT, there is intr nd extrheptic biliry dilttion which is new from the CT of 07/22/2020. The common heptic duct mesures 1.4 cm in dimeter, nd the common bile duct 1.3 cm. The duct tpers distlly. No mpullry mss is identified. 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 Pncres: Mild peripncretic soft tissue strnding consistent with mild cute interstitil pncretitis. No focl peripncretic fluid collection or pncretic ductl dilttion. The pncres enhnces homogeneously following contrst. No evidence of pncretic necrosis. Spleen: Norml in size without focl bnormlity. Adrenls/Urinry Trct: Both drenl glnds pper norml. No evidence of hydronephrosis or suspicious renl lesion. There re smll renl corticl nd prpelvic cysts bilterlly for which no specific follow-up imging is recommended. Stomch/Bowel:  The stomch ppers unremrkble for its degree of distension. No evidence of bowel wll thickening, distention or surrounding inflmmtory chnge. Vsculr/Lymphtic: There re no enlrged bdominl lymph nodes. Aortic nd brnch vessel therosclerosis better seen on CT. No evidence of neurysm or lrge vessel occlusion. The portl, superior mesenteric nd splenic veins re ptent. Other: No evidence of bdominl wll herni, scites or focl extrluminl fluid collection. Musculoskeletl: No cute or significnt osseous findings. Multilevel spondylosis ssocited with  mild scoliosis. Unless specific follow-up recommendtions re mentioned in the findings or impression sections, no imging follow-up of ny mentioned incidentl findings is recommended. IMPRESSION: 1. Choledocholithisis with ssocited intr nd extrheptic biliry dilttion. No mpullry mss identified. Consider further evlution with ERCP. 2. Mild peripncretic soft tissue strnding consistent with mild cute interstitil pncretitis. No significnt pncretic ductl dilttion, focl peripncretic fluid collection or evidence of pncretic necrosis. 3. No other significnt findings. Electroniclly Signed   By: Crey Bullocks M.D.   On: 06/23/2023 16:18 Scheduled Meds:  amLODipine  2.5 mg Oral Daily   docusate sodium  100 mg Oral BID   irbesartan  150 mg Oral Daily   lidocaine  1 patch Transdermal Q24H   metoprolol succinate  100 mg Oral Daily   senna  1 tablet Oral BID   Continuous Infusions:  lactated ringers 1,000 mL with potassium chloride 40 mEq infusion       LOS: 2 days    Time spent: over 30 min    Lacretia Nicks, MD [facility]   To contact the attending provider between 7A-7P or the covering provider during after hours 7P-7A, please log into the web site [email or web address] and access using universal [place] password for that web site. If you do not have the password, please call the  hospital operator.  06/25/2023, 8:44 AM

## 2023-06-26 DIAGNOSIS — K50818 Crohn's disease of both small and large intestine with other complication: Secondary | ICD-10-CM | POA: Diagnosis not present

## 2023-06-26 DIAGNOSIS — K859 Acute pancreatitis without necrosis or infection, unspecified: Secondary | ICD-10-CM | POA: Diagnosis not present

## 2023-06-26 DIAGNOSIS — K851 Biliary acute pancreatitis without necrosis or infection: Secondary | ICD-10-CM | POA: Diagnosis not present

## 2023-06-26 LAB — CBC
HCT: 31.2 % — ABNORMAL LOW (ref 36.0–46.0)
Hemoglobin: 9.6 g/dL — ABNORMAL LOW (ref 12.0–15.0)
MCH: 24.9 pg — ABNORMAL LOW (ref 26.0–34.0)
MCHC: 30.8 g/dL (ref 30.0–36.0)
MCV: 80.8 fL (ref 80.0–100.0)
Platelets: 189 10*3/uL (ref 150–400)
RBC: 3.86 MIL/uL — ABNORMAL LOW (ref 3.87–5.11)
RDW: 15.5 % (ref 11.5–15.5)
WBC: 3.6 10*3/uL — ABNORMAL LOW (ref 4.0–10.5)
nRBC: 0 % (ref 0.0–0.2)

## 2023-06-26 LAB — COMPREHENSIVE METABOLIC PANEL WITH GFR
ALT: 61 U/L — ABNORMAL HIGH (ref 0–44)
AST: 25 U/L (ref 15–41)
Albumin: 2.2 g/dL — ABNORMAL LOW (ref 3.5–5.0)
Alkaline Phosphatase: 104 U/L (ref 38–126)
Anion gap: 6 (ref 5–15)
BUN: <5 mg/dL — ABNORMAL LOW (ref 8–23)
CO2: 22 mmol/L (ref 22–32)
Calcium: 8.4 mg/dL — ABNORMAL LOW (ref 8.9–10.3)
Chloride: 111 mmol/L (ref 98–111)
Creatinine, Ser: 0.45 mg/dL (ref 0.44–1.00)
GFR, Estimated: >60 mL/min (ref >60)
Glucose, Bld: 153 mg/dL — ABNORMAL HIGH (ref 70–99)
Potassium: 3.7 mmol/L (ref 3.5–5.1)
Sodium: 139 mmol/L (ref 135–145)
Total Bilirubin: 0.3 mg/dL (ref 0.3–1.2)
Total Protein: 4.9 g/dL — ABNORMAL LOW (ref 6.5–8.1)

## 2023-06-26 LAB — LIPASE, BLOOD: Lipase: 257 U/L — ABNORMAL HIGH (ref 11–51)

## 2023-06-26 MED ORDER — ENTYVIO 300 MG IV SOLR: 300.0000 mg | INTRAVENOUS | Status: DC

## 2023-06-26 MED ORDER — LIDOCAINE 5 % EX PTCH: 1.0000 | MEDICATED_PATCH | CUTANEOUS | 0 refills | Status: DC

## 2023-06-26 NOTE — Progress Notes (Addendum)
   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 at ERCP 8/11  Crohn's disease large and small intestine on Entyvio  ------------------------------------------------------------------------------------------------------ DC today Has 9/5 appt me  Go to Entyvio infusion as planned 8/17  She does not want to do lap chole (no stones on [place])  OK to restart simvistatin     Subjective  Doing well - to go home today   Objective  BP (!) 134/58 (BP Location: Right Arm)   Pulse 69   Temp 97.9 F (36.6 C) (Oral)   Resp 16   Ht 5\' 3"  (1.6 m)   Wt 77.7 kg   SpO2 98%   BMI 30.34 kg/m    Recent Labs  Lab 06/20/23 0615 06/22/23 2338 06/23/23 2355 06/25/23 0044 06/26/23 0050  AST 15 88* 150* 47* 25  ALT 11 30 147* 86* 61*  ALKPHOS 51 97 116 99 104  BILITOT 0.9 1.1 1.7* 0.7 0.3  PROT 5.6* 6.7 5.3* 5.1* 4.9*  ALBUMIN 2.9* 3.9 2.6* 2.4* 2.2*   Lab Results  Component Value Date   LIPASE 257 (H) 06/26/2023       US Abdomen Limited RUQ (LIVER/GB) CLINICAL DATA:  Choledocholithiasis, evaluate for cholelithiasis/sludge, status post ERCP  EXAM: ULTRASOUND ABDOMEN LIMITED RIGHT UPPER QUADRANT  COMPARISON:  MRI abdomen dated 06/23/2023  FINDINGS: Gallbladder:  No gallstones or wall thickening visualized. No sonographic Murphy sign noted by sonographer.  Common bile duct:  Diameter: 10 mm, previously 13 mm on MR  Liver:  Within the upper limits of normal for parenchymal echogenicity. No focal hepatic lesion is seen. Portal vein is patent on color Doppler imaging with normal direction of blood flow towards the liver.  Other: None.  IMPRESSION: No cholelithiasis or gallbladder sludge.  Mildly dilated common duct, mildly improved status post ERCP.  Electronically Signed   By: Charline Bills M.D.   On: 06/25/2023 23:00 DG ERCP CLINICAL DATA:   Choledocholithiasis  EXAM: ERCP  TECHNIQUE: Multiple spot images obtained with the fluoroscopic device and submitted for interpretation post-procedure.  COMPARISON:  MRCP 06/23/2023  FINDINGS: Series of fluoroscopic spot images document endoscopic cannulation and opacification of the CBD. Subsequent balloon catheter passage through the CBD. Incomplete opacification of the intrahepatic biliary tree, which appears mildly distended centrally.  IMPRESSION: Endoscopic CBD cannulation and intervention as above.  These images were submitted for radiologic interpretation only. Please see the procedural report for the amount of contrast and the fluoroscopy time utilized.  Electronically Signed   By: Corlis Leak M.D.   On: 06/25/2023 17:04     Iva Boop, MD, [facility] See Loretha Stapler on call - gastroenterology for best contact person 06/26/2023 10:56 AM

## 2023-06-26 NOTE — Progress Notes (Signed)
Progress Note  1 Day Post-Op  Subjective: Pt denies abdominal pain, nausea or vomiting. Passing some flatus. Hopeful to go home. Discussed [place] results with no cholelithiasis noted, patient understands and does not wish to have surgery at this time  Objective: Vital signs in last 24 hours: Temp:  [97.5 F (36.4 C)-98.2 F (36.8 C)] 97.9 F (36.6 C) (08/11 0749) Pulse Rate:  [68-99] 69 (08/11 0749) Resp:  [12-18] 16 (08/11 0749) BP: (101-134)/(52-85) 134/58 (08/11 0749) SpO2:  [94 %-99 %] 98 % (08/11 0749) Weight:  [77.7 kg] 77.7 kg (08/10 1140) Last BM Date : 06/23/23  Intake/Output from previous day: 08/10 0701 - 08/11 0700 In: 500 [I.V.:400; IV Piggyback:100] Out: 0  Intake/Output this shift: No intake/output data recorded.  PE: General: pleasant, WD, elderly female who is laying in bed in NAD HEENT: sclera anicteric  Lungs: Respiratory effort nonlabored Abd: soft, NT, ND, +BS, no masses, hernias, or organomegaly Psych: A&Ox3 with an appropriate affect.    Lab Results:  Recent Labs    06/25/23 0044 06/26/23 0050  WBC 4.4 3.6*  HGB 9.5* 9.6*  HCT 31.6* 31.2*  PLT 175 189   BMET Recent Labs    06/25/23 0044 06/26/23 0050  NA 136 139  K 3.1* 3.7  CL 107 111  CO2 22 22  GLUCOSE 101* 153*  BUN <5* <5*  CREATININE 0.47 0.45  CALCIUM 8.4* 8.4*   PT/INR No results for input(s): "LABPROT", "INR" in the last 72 hours. CMP     Component Value Date/Time   NA 139 06/26/2023 0050   NA 142 05/25/2022 1500   K 3.7 06/26/2023 0050   CL 111 06/26/2023 0050   CO2 22 06/26/2023 0050   GLUCOSE 153 (H) 06/26/2023 0050   BUN <5 (L) 06/26/2023 0050   BUN 13 05/25/2022 1500   CREATININE 0.45 06/26/2023 0050   CALCIUM 8.4 (L) 06/26/2023 0050   PROT 4.9 (L) 06/26/2023 0050   ALBUMIN 2.2 (L) 06/26/2023 0050   AST 25 06/26/2023 0050   ALT 61 (H) 06/26/2023 0050   ALKPHOS 104 06/26/2023 0050   BILITOT 0.3 06/26/2023 0050   GFRNONAA >60 06/26/2023 0050   GFRAA >60  02/23/2017 0423   Lipase     Component Value Date/Time   LIPASE 257 (H) 06/26/2023 0050       Studies/Results: US Abdomen Limited RUQ (LIVER/GB)  Result Date: 06/25/2023 CLINICAL DATA:  Choledocholithiasis, evaluate for cholelithiasis/sludge, status post ERCP EXAM: ULTRASOUND ABDOMEN LIMITED RIGHT UPPER QUADRANT COMPARISON:  MRI abdomen dated 06/23/2023 FINDINGS: Gallbladder: No gallstones or wall thickening visualized. No sonographic Murphy sign noted by sonographer. Common bile duct: Diameter: 10 mm, previously 13 mm on MR Liver: Within the upper limits of normal for parenchymal echogenicity. No focal hepatic lesion is seen. Portal vein is patent on color Doppler imaging with normal direction of blood flow towards the liver. Other: None. IMPRESSION: No cholelithiasis or gallbladder sludge. Mildly dilated common duct, mildly improved status post ERCP. Electronically Signed   By: Charline Bills M.D.   On: 06/25/2023 23:00   DG ERCP  Result Date: 06/25/2023 CLINICAL DATA:  Choledocholithiasis EXAM: ERCP TECHNIQUE: Multiple spot images obtained with the fluoroscopic device and submitted for interpretation post-procedure. COMPARISON:  MRCP 06/23/2023 FINDINGS: Series of fluoroscopic spot images document endoscopic cannulation and opacification of the CBD. Subsequent balloon catheter passage through the CBD. Incomplete opacification of the intrahepatic biliary tree, which appears mildly distended centrally. IMPRESSION: Endoscopic CBD cannulation and intervention as above. These  images were submitted for radiologic interpretation only. Please see the procedural report for the amount of contrast and the fluoroscopy time utilized. Electronically Signed   By: Corlis Leak M.D.   On: 06/25/2023 17:04    Anti-infectives: Anti-infectives (From admission, onward)    Start     Dose/Rate Route Frequency Ordered Stop   06/25/23 1000  ampicillin-sulbactam (UNASYN) 1.5 g in sodium chloride 0.9 % 100 mL IVPB         1.5 g 200 mL/hr over 30 Minutes Intravenous  Once 06/25/23 0911 06/25/23 1300        Assessment/Plan  82 year old woman who is recovering from gallstone pancreatitis and is now postop day1 status post successful ERCP.  The gallbladder/cystic duct did not opacify, an 8 mm biliary sphincterotomy was made and balloon sweep retrieved sludge as well as 2 stones.  - RUQ US done yesterday and does not show any gallstones - pt reports symptoms have resolved  - LFTs and lipase are downtrending - patient does not wish to proceed with laparoscopic cholecystectomy with no stones present on imaging  - recommend discharge home with outpatient follow up with GI and C[facility] as needed    LOS: 3 days   I reviewed Consultant GI notes, hospitalist notes, last 24 h vitals and pain scores, last 48 h intake and output, last 24 h labs and trends, and last 24 h imaging results.    Juliet Rude, [facility] 06/26/2023, 9:28 AM Please see Amion for pager number during day hours 7:00am-4:30pm

## 2023-06-26 NOTE — Discharge Summary (Signed)
**Note De-Identified vi Obfusction** Physicin Dischrge Summry  Jade Hernandez NFA:[id number] DOB: [birth year] DOA: 06/22/2023  PCP: Jade Rider, MD  Admit dte: 06/22/2023 Dischrge dte: 06/26/2023  Time spent: 40 minutes  Recommendtions for Outptient Follow-up:  Follow outptient CBC/CMP  Follow with surgery outptient for discussion of cholecystectomy  Follow with GI outptient Mild LE edem, chronic follow with PCP outptient - tril of diuretic? Relted to mlodipine?  (Generlly mild nd not prticulrly bothersome t this time for her it seems)   Dischrge Dignoses:  Principl Problem:   Acute pncretitis Active Problems:   Hypertension   Osteorthritis of knee   Hyperlipidemi   GERD (gstroesophgel reflux disese)   Crohn's disese of both smll nd lrge intestine with other compliction (HCC)   Iron deficiency nemi due to chronic blood loss   Choledocholithisis   Dischrge Condition: stble  Diet recommendtion: hert helthy  Filed Weights   06/22/23 2324 06/25/23 1140  Weight: 77.7 kg 77.7 kg    History of present illness:   Jade Hernandez is  82 y.o. femle with PMH significnt for Crohn's disese, hypertension,  Hyperlipidemi,  irritble bowel syndrome presented in the ED with complints of bdominl pin rditing towrds the bck.   Admitted with gllstone pncretitis. S/p ERCP.  Generl surgery c/s, cholecystectomy deferred t this time per ptient preference, will need outptient follow up.  Hospitl Course:  Assessment nd Pln:  Gllstone pncretitis  Choledocholithisis  Elevted LFT's: MRCP with choledocholithisis with ssocited intr nd extrheptic biliry dilttion Pin improved tody S/p ERCP -> choledocholithisis -> biliry sphincterotomy nd blloon trwl Surgery ws consulted -> RUQ Kore without cholelithisis or gllbldder sludge --- surgery plnning for outptient follow up t this time Follow outptient with GI    Crohn's disese: Ptient ws  treted for 2 months with budesonide. Ptient is tking Entyvio every 8 weeks.  Ok to continue per GI.   Bilterl LE Edem Echo 01/2023 with norml EF, norml LV distolic prmeters Follow BNP mildly elevted, follow UA (negtive protein).  Albumin is 2.4. She notes this is reltively chronic Would follow outptient, consider tril of diuretic? Consider reltionship with mlodipine?   Essentil hypertension: mlodipine nd metoprolol   Microcytic Anemi Trend Follow outptient      Procedures: ERCP   Consulttions: Surgery GI  Dischrge Exm: Vitls:   06/26/23 0438 06/26/23 0749  BP: 128/84 (!) 134/58  Pulse: 68 69  Resp: 18 16  Temp: 98 F (36.7 C) 97.9 F (36.6 C)  SpO2: 99% 98%   No complints Eger to dischrge home if possible  Generl: No cute distress. Crdiovsculr: RRR Lungs: unlbored Abdomen: Soft, nontender, nondistended  Neurologicl: Alert nd oriented 3. Moves ll extremities 4 with equl strength. Crnil nerves II through XII grossly intct. Extremities: No clubbing or cynosis. No edem.   Dischrge Instructions   Dischrge Instructions     Cll MD for:  difficulty brething, hedche or visul disturbnces   Complete by: As directed    Cll MD for:  extreme ftigue   Complete by: As directed    Cll MD for:  hives   Complete by: As directed    Cll MD for:  persistnt dizziness or light-hededness   Complete by: As directed    Cll MD for:  persistnt nuse nd vomiting   Complete by: As directed    Cll MD for:  redness, tenderness, or signs of infection (pin, swelling, redness, odor or green/yellow dischrge round incision site)   Complete by: As directed **Note De-Identified vi Obfusction** Cll MD for:  severe uncontrolled pin   Complete by: s directed    Cll MD for:  temperture >100.4   Complete by: s directed    Diet - low sodium hert helthy   Complete by: s directed    Dischrge instructions   Complete by: s directed    You were seen  for gllstone pncretitis.  You've hd n ERCP nd choledocholithisis (stones in the bile duct) were found nd removed.  You hd  sphincterotomy.    You were seen by surgery nd hd n ultrsound which showed no stones or sludge in the gllbldder.    Cholecystectomy (gllbldder removl) is typiclly recommended fter gllstone pncretitis nd choledocholithisis.  You should continue to discuss this in follow up with generl surgery s n outptient.  This hs been deferred t this time with no stones found in the gllbldder.   Follow up with gstroenterology s n outptient.  You should continue to tke the entyvio s prescribed.   Return for new, recurrent, or worsening symptoms.  Plese sk your PCP to request records from this hospitliztion so they know wht ws done nd wht the next steps will be.   Increse ctivity slowly   Complete by: s directed       llergies s of 06/26/2023       Rections   Ferheme [ferumoxytol] Shortness Of Breth, Plpittions   Severe pin ll over        Mediction List     TKE these medictions    cetminophen 500 MG tblet Commonly known s: TYLENOL Tke 1,000 mg by mouth in the morning nd t bedtime.   mLODipine 2.5 MG tblet Commonly known s: NORVSC Tke 1 tblet (2.5 mg totl) by mouth dily.   choleclciferol 25 MCG (1000 UNIT) tblet Commonly known s: VITMIN D3 Tke 1,000 Units by mouth dily.   Entyvio 300 MG injection Generic drug: vedolizumb Inject 300 mg into the vein every 2 (two) months.   lidocine 5 % Commonly known s: LIDODERM Plce 1 ptch onto the skin dily. Remove & Discrd ptch within 12 hours or s directed by MD Strt tking on: ugust 12, 2024   metoprolol succinte 100 MG 24 hr tblet Commonly known s: TOPROL-XL TKE 1 TBLET(100 MG) BY MOUTH DILY WITH OR IMMEDITELY FOLLOWING  MEL Wht chnged: See the new instructions.   PROBIOTIC DILY PO Tke 1 cpsule by mouth in the  morning.   vlsrtn 160 MG tblet Commonly known s: DIOVN Tke 1 tblet (160 mg totl) by mouth dily.       llergies  llergen Rections   Ferheme [Ferumoxytol] Shortness Of Breth nd Plpittions    Severe pin ll over    Follow-up Informtion     Bern Bue, MD Follow up.   Specilty: Generl Surgery Why: follow outptient to discuss gllbldder surgery Contct informtion: [address] [place] [address] [phone]         Iv Boop, MD Follow up.   Specilty: Gstroenterology Why: follow up s scheduled Contct informtion: [address] [place] [address] [phone]         Jade Rider, MD Follow up.   Specilty: Fmily Medicine Contct informtion: [address] [place] [address] [phone]                  The results of significnt dignostics from this hospitliztion (including imging, microbiology, ncillry nd lbortory) re listed **Note De-Identified vi Obfusction** below for reference.    Significnt Dignostic Studies: [place] Abdomen Limited RUQ (LIVER/GB)  Result Dte: 06/25/2023 CLINICAL DATA:  Choledocholithisis, evlute for cholelithisis/sludge, sttus post ERCP EXAM: ULTRASOUND ABDOMEN LIMITED RIGHT UPPER QUADRANT COMPARISON:  MRI bdomen dted 06/23/2023 FINDINGS: Gllbldder: No gllstones or wll thickening visulized. No sonogrphic Murphy sign noted by sonogrpher. Common bile duct: Dimeter: 10 mm, previously 13 mm on MR Liver: Within the upper limits of norml for prenchyml echogenicity. No focl heptic lesion is seen. Portl vein is ptent on color Doppler imging with norml direction of blood flow towrds the liver. Other: None. IMPRESSION: No cholelithisis or gllbldder sludge. Mildly dilted common duct, mildly improved sttus post ERCP. Electroniclly Signed   By: Chrline Bills M.D.   On: 06/25/2023 23:00   DG ERCP  Result Dte: 06/25/2023 CLINICAL DATA:   Choledocholithisis EXAM: ERCP TECHNIQUE: Multiple spot imges obtined with the fluoroscopic device nd submitted for interprettion post-procedure. COMPARISON:  MRCP 06/23/2023 FINDINGS: Series of fluoroscopic spot imges document endoscopic cnnultion nd opcifiction of the CBD. Subsequent blloon ctheter pssge through the CBD. Incomplete opcifiction of the intrheptic biliry tree, which ppers mildly distended centrlly. IMPRESSION: Endoscopic CBD cnnultion nd intervention s bove. These imges were submitted for rdiologic interprettion only. Plese see the procedurl report for the mount of contrst nd the fluoroscopy time utilized. Electroniclly Signed   By: Corlis Lek M.D.   On: 06/25/2023 17:04   MR ABDOMEN MRCP W WO CONTAST  Result Dte: 06/23/2023 CLINICAL DATA:  Acute pncretitis. Biliry dilttion on bdominl CT. History of Crohn's disese nd ureterl bowel syndrome. EXAM: MRI ABDOMEN WITHOUT AND WITH CONTRAST (INCLUDING MRCP) TECHNIQUE: Multiplnr multisequence MR imging of the bdomen ws performed both before nd fter the dministrtion of intrvenous contrst. Hevily T2-weighted imges of the biliry nd pncretic ducts were obtined, nd three-dimensionl MRCP imges were rendered by post processing. CONTRAST:  7.25mL GADAVIST GADOBUTROL 1 MMOL/ML IV SOLN COMPARISON:  Abdominopelvic CT 06/18/2023 nd 07/22/2020. FINDINGS: Lower chest: No cute findings re seen in the lower chest. There is similr liner telectsis or scrring t both lung bses. Heptobiliry: The liver hs  non cirrhotic morphology nd shows no significnt stetosis or focl bnormlity. No significnt gllbldder distension, wll thickening or surrounding inflmmtion. No definite gllstones re seen, lthough there re severl smll clculi within the common bile duct, most obvious on coronl imge 22 of series 2. As seen on recent CT, there is intr nd extrheptic biliry dilttion which is  new from the CT of 07/22/2020. The common heptic duct mesures 1.4 cm in dimeter, nd the common bile duct 1.3 cm. The duct tpers distlly. No mpullry mss is identified. 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 Pncres: Mild peripncretic soft tissue strnding consistent with mild cute interstitil pncretitis. No focl peripncretic fluid collection or pncretic ductl dilttion. The pncres enhnces homogeneously following contrst. No evidence of pncretic necrosis. Spleen: Norml in size without focl bnormlity. Adrenls/Urinry Trct: Both drenl glnds pper norml. No evidence of hydronephrosis or suspicious renl lesion. There re smll renl corticl nd prpelvic cysts bilterlly for which no specific follow-up imging is recommended. Stomch/Bowel: The stomch ppers unremrkble for its degree of distension. No evidence of bowel wll thickening, distention or surrounding inflmmtory chnge. Vsculr/Lymphtic: There re no enlrged bdominl lymph nodes. Aortic nd brnch vessel therosclerosis better seen on CT. No evidence of neurysm or lrge vessel occlusion. The portl, superior mesenteric nd splenic veins re ptent. **Note De-Identified vi Obfusction** Other: No evidence of bdominl wll herni, scites or focl extrluminl fluid collection. Musculoskeletl: No cute or significnt osseous findings. Multilevel spondylosis ssocited with  mild scoliosis. Unless specific follow-up recommendtions re mentioned in the findings or impression sections, no imging follow-up of ny mentioned incidentl findings is recommended. IMPRESSION: 1. Choledocholithisis with ssocited intr nd extrheptic biliry dilttion. No mpullry mss identified. Consider further evlution with ERCP. 2. Mild peripncretic soft tissue strnding consistent with mild cute interstitil pncretitis. No significnt pncretic ductl dilttion,  focl peripncretic fluid collection or evidence of pncretic necrosis. 3. No other significnt findings. Electroniclly Signed   By: Crey Bullocks M.D.   On: 06/23/2023 16:18   MR 3D Recon At Scnner  Result Dte: 06/23/2023 CLINICAL DATA:  Acute pncretitis. Biliry dilttion on bdominl CT. History of Crohn's disese nd ureterl bowel syndrome. EXAM: MRI ABDOMEN WITHOUT AND WITH CONTRAST (INCLUDING MRCP) TECHNIQUE: Multiplnr multisequence MR imging of the bdomen ws performed both before nd fter the dministrtion of intrvenous contrst. Hevily T2-weighted imges of the biliry nd pncretic ducts were obtined, nd three-dimensionl MRCP imges were rendered by post processing. CONTRAST:  7.25mL GADAVIST GADOBUTROL 1 MMOL/ML IV SOLN COMPARISON:  Abdominopelvic CT 06/18/2023 nd 07/22/2020. FINDINGS: Lower chest: No cute findings re seen in the lower chest. There is similr liner telectsis or scrring t both lung bses. Heptobiliry: The liver hs  non cirrhotic morphology nd shows no significnt stetosis or focl bnormlity. No significnt gllbldder distension, wll thickening or surrounding inflmmtion. No definite gllstones re seen, lthough there re severl smll clculi within the common bile duct, most obvious on coronl imge 22 of series 2. As seen on recent CT, there is intr nd extrheptic biliry dilttion which is new from the CT of 07/22/2020. The common heptic duct mesures 1.4 cm in dimeter, nd the common bile duct 1.3 cm. The duct tpers distlly. No mpullry mss is identified. 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 Pncres: Mild peripncretic soft tissue strnding consistent with mild cute interstitil pncretitis. No focl peripncretic fluid collection or pncretic ductl dilttion. The pncres enhnces homogeneously following contrst. No evidence of  pncretic necrosis. Spleen: Norml in size without focl bnormlity. Adrenls/Urinry Trct: Both drenl glnds pper norml. No evidence of hydronephrosis or suspicious renl lesion. There re smll renl corticl nd prpelvic cysts bilterlly for which no specific follow-up imging is recommended. Stomch/Bowel: The stomch ppers unremrkble for its degree of distension. No evidence of bowel wll thickening, distention or surrounding inflmmtory chnge. Vsculr/Lymphtic: There re no enlrged bdominl lymph nodes. Aortic nd brnch vessel therosclerosis better seen on CT. No evidence of neurysm or lrge vessel occlusion. The portl, superior mesenteric nd splenic veins re ptent. Other: No evidence of bdominl wll herni, scites or focl extrluminl fluid collection. Musculoskeletl: No cute or significnt osseous findings. Multilevel spondylosis ssocited with  mild scoliosis. Unless specific follow-up recommendtions re mentioned in the findings or impression sections, no imging follow-up of ny mentioned incidentl findings is recommended. IMPRESSION: 1. Choledocholithisis with ssocited intr nd extrheptic biliry dilttion. No mpullry mss identified. Consider further evlution with ERCP. 2. Mild peripncretic soft tissue strnding consistent with mild cute interstitil pncretitis. No significnt pncretic ductl dilttion, focl peripncretic fluid collection or evidence of pncretic necrosis. 3. No other significnt findings. Electroniclly Signed   By: Crey Bullocks M.D.   On: 06/23/2023 16:18   CT ABDOMEN PELVIS W CONTRAST  Result Dte: 06/18/2023 CLINICAL DATA:  Acute bdominl **Note De-Identified vi Obfusction** pin.  History of Crohn's disese. EXAM: CT ABDOMEN AND PELVIS WITH CONTRAST TECHNIQUE: Multidetector CT imging of the bdomen nd pelvis ws performed using the stndrd protocol following bolus dministrtion of intrvenous contrst. RADIATION DOSE REDUCTION: This exm ws  performed ccording to the deprtmentl dose-optimiztion progrm which includes utomted exposure control, djustment of the mA nd/or kV ccording to ptient size nd/or use of itertive reconstruction technique. CONTRAST:  OMNIPAQUE IOHEXOL 300 MG/ML  SOLN COMPARISON:  CT 07/22/2020 FINDINGS: Lower chest: Subsegmentl telectsis within both lung bses. No significnt pleurl effusion. Aortic therosclerosis. Heptobiliry: Gllbldder is mildly distended. There is intr nd extrheptic biliry ductl dilttion. The common bile duct mesures 17 mm t the port heptis. No clcified gllstone or visulized choledocholithisis. No pericholecystic ft strnding. No focl heptic lesion. Pncres: Diffuse peripncretic ft strnding nd edem. No evidence of obstructing pncretic mss. Borderline pncretic ductl dilttion, 3-4 mm. Homogeneous pncretic enhncement. No pncretic necrosis. No cute peripncretic collection. Spleen: Norml in size without focl bnormlity. The splenic vein is ptent. Adrenls/Urinry Trct: No drenl nodule. No hydronephrosis. 3 mm nonobstructing stone in the upper pole of the right kidney. Suspicious renl lesion. Unremrkble urinry bldder. Stomch/Bowel: Smll hitl herni. The stomch is nondistended. There is  perimpullry duodenl diverticulum. Mild wll thickening bout the fourth portion of the duodenum. Mild Peri duodenl ft strnding nd edem. No other smll bowel inflmmtion. No smll bowel obstruction. Norml ppendix. No terminl ilel inflmmtion. Scttered colonic diverticul. The mjority of the colon is decompressed which limits ssessment for wll thickening. There is no significnt pericolonic edem or evidence of colonic inflmmtion. Vsculr/Lymphtic: Aortic therosclerosis nd tortuosity. No neurysm. The portl nd splenic veins re ptent. No enlrged lymph nodes in the bdomen or pelvis. Reproductive: Unchnged 18 mm simple  ppering right ovrin cyst series 2, imge 61. Anteverted uterus. No suspicious dnexl mss. Other: Retroperitonel ft strnding nd edem with trce free fluid. No free ir or focl fluid collection. No bdominl wll herni. Musculoskeletl: Degenertive chnge throughout the spine. There re no cute or suspicious osseous bnormlities. IMPRESSION: 1. Findings consistent with cute pncretitis. No evidence of pncretic necrosis or cute peripncretic collection. 2. Intr nd extrheptic biliry ductl dilttion with common bile duct mesuring 17 mm. No clcified gllstone or visulized choledocholithisis. This my be relted to pncretic inflmmtion or perimpullry duodenl diverticulum. Recommend correltion with LFTs. Consider further evlution with MRCP. 3. Wll thickening bout the duodenum with djcent ft strnding, felt to be secondry to pncretic inflmmtion. Primry nd duodenl inflmmtion is felt less likely. No other evidence of bowel inflmmtion. 4. Colonic diverticulosis without diverticulitis. 5. Nonobstructing right renl stone. 6. Simple ppering right ovrin cyst mesuring 18 mm. This is unchnged from 2021 exm nd most likely benign. No follow-up imging is recommended per consensus guidelines. Aortic Atherosclerosis (ICD10-I70.0). Electroniclly Signed   By: Nrd Rutherford M.D.   On: 06/18/2023 15:09    Microbiology: No results found for this or ny previous visit (from the pst 240 hour(s)).   Lbs: Bsic Metbolic Pnel: Recent Lbs  Lb 06/21/23 0457 06/22/23 2338 06/23/23 1356 06/23/23 2355 06/25/23 0044 06/26/23 0050  NA 138 139  --  135 136 139  K 3.8 3.7  --  3.2* 3.1* 3.7  CL 108 107  --  105 107 111  CO2 20* 23  --  22 22 22   GLUCOSE 105* 141*  --  97 101* 153*  BUN 11 18  --  10 <5* <5*  CREATININE 0.46 0.57 0.47 0.45 0.47 0.45  CALCIUM 8.9 9.8  --  8.5* 8.4* 8.4*  MG  --   --   --  1.8  --   --   PHOS  --   --   --  2.5  --   --     Liver Function Tests: Recent Labs  Lab 06/20/23 0615 06/22/23 2338 06/23/23 2355 06/25/23 0044 06/26/23 0050  AST 15 88* 150* 47* 25  ALT 11 30 147* 86* 61*  ALKPHOS 51 97 116 99 104  BILITOT 0.9 1.1 1.7* 0.7 0.3  PROT 5.6* 6.7 5.3* 5.1* 4.9*  ALBUMIN 2.9* 3.9 2.6* 2.4* 2.2*   Recent Labs  Lab 06/21/23 0457 06/22/23 2338 06/23/23 2355 06/25/23 0044 06/26/23 0050  LIPASE 329* 8,565* 1,718* 446* 257*   No results for input(s): "AMMONIA" in the last 168 hours. CBC: Recent Labs  Lab 06/22/23 2338 06/23/23 1356 06/23/23 2355 06/25/23 0044 06/26/23 0050  WBC 9.5 9.1 6.3 4.4 3.6*  HGB 11.9* 11.8* 10.2* 9.5* 9.6*  HCT 38.1 38.5 32.1* 31.6* 31.2*  MCV 78.2* 79.2* 79.3* 79.2* 80.8  PLT 262 222 199 175 189   Cardiac Enzymes: No results for input(s): "CKTOTAL", "CKMB", "CKMBINDEX", "TROPONINI" in the last 168 hours. BNP: BNP (last 3 results) Recent Labs    06/24/23 1123  BNP 130.5*    ProBNP (last 3 results) No results for input(s): "PROBNP" in the last 8760 hours.  CBG: No results for input(s): "GLUCAP" in the last 168 hours.     Signed:  Lacretia Nicks MD.  [facility] 06/26/2023, 9:54 AM

## 2023-06-26 NOTE — Plan of Care (Signed)
Patient alert/oriented X4. Patient compliant with medication administration and PIV's removed prior to discharge. AVS instructions explained to patient in detail. Patient tolerated soft food diet without nausea/vomiting. VSS upon discharge, and patient belongings packed up at bedside.   Problem: Education: Goal: Knowledge of General Education information will improve Description: Including pain rating scale, medication(s)/side effects and non-pharmacologic comfort measures Outcome: Adequate for Discharge   Problem: Health Behavior/Discharge Planning: Goal: Ability to manage health-related needs will improve Outcome: Adequate for Discharge   Problem: Clinical Measurements: Goal: Ability to maintain clinical measurements within normal limits will improve Outcome: Adequate for Discharge   Problem: Clinical Measurements: Goal: Will remain free from infection Outcome: Adequate for Discharge   Problem: Clinical Measurements: Goal: Diagnostic test results will improve Outcome: Adequate for Discharge   Problem: Clinical Measurements: Goal: Respiratory complications will improve Outcome: Adequate for Discharge   Problem: Clinical Measurements: Goal: Cardiovascular complication will be avoided Outcome: Adequate for Discharge   Problem: Activity: Goal: Risk for activity intolerance will decrease Outcome: Adequate for Discharge   Problem: Nutrition: Goal: Adequate nutrition will be maintained Outcome: Adequate for Discharge   Problem: Coping: Goal: Level of anxiety will decrease Outcome: Adequate for Discharge   Problem: Elimination: Goal: Will not experience complications related to bowel motility Outcome: Adequate for Discharge   Problem: Elimination: Goal: Will not experience complications related to urinary retention Outcome: Adequate for Discharge   Problem: Pain Managment: Goal: General experience of comfort will improve Outcome: Adequate for Discharge   Problem:  Safety: Goal: Ability to remain free from injury will improve Outcome: Adequate for Discharge   Problem: Skin Integrity: Goal: Risk for impaired skin integrity will decrease Outcome: Adequate for Discharge

## 2023-06-27 ENCOUNTER — Encounter (HOSPITAL_COMMUNITY): Payer: Self-pay | Admitting: Gastroenterology

## 2023-06-28 DIAGNOSIS — D509 Iron deficiency anemia, unspecified: Secondary | ICD-10-CM | POA: Diagnosis not present

## 2023-06-28 DIAGNOSIS — I10 Essential (primary) hypertension: Secondary | ICD-10-CM | POA: Diagnosis not present

## 2023-06-28 DIAGNOSIS — K851 Biliary acute pancreatitis without necrosis or infection: Secondary | ICD-10-CM | POA: Diagnosis not present

## 2023-06-28 DIAGNOSIS — D649 Anemia, unspecified: Secondary | ICD-10-CM | POA: Diagnosis not present

## 2023-06-28 DIAGNOSIS — K50019 Crohn's disease of small intestine with unspecified complications: Secondary | ICD-10-CM | POA: Diagnosis not present

## 2023-06-28 DIAGNOSIS — R748 Abnormal levels of other serum enzymes: Secondary | ICD-10-CM | POA: Diagnosis not present

## 2023-06-28 DIAGNOSIS — E782 Mixed hyperlipidemia: Secondary | ICD-10-CM | POA: Diagnosis not present

## 2023-06-29 LAB — LAB REPORT - SCANNED: EGFR: 97

## 2023-06-30 ENCOUNTER — Telehealth: Payer: Self-pay | Admitting: Gastroenterology

## 2023-06-30 MED ORDER — HYDROCODONE-ACETAMINOPHEN 5-325 MG PO TABS: 1.0000 | ORAL_TABLET | Freq: Four times a day (QID) | ORAL | 0 refills | Status: DC | PRN

## 2023-06-30 NOTE — Telephone Encounter (Signed)
We did discuss this at hospital - I sent in Rx  Meds ordered this encounter  Medications                  HYDROcodone-acetaminophen (NORCO/VICODIN) 5-325 MG tablet    Sig: Take 1 tablet by mouth every 6 (six) hours as needed for moderate pain.    Dispense:  10 tablet    Refill:  0

## 2023-06-30 NOTE — Telephone Encounter (Signed)
Patient made aware of prescription that had been sent to pharmacy.

## 2023-06-30 NOTE — Telephone Encounter (Signed)
This is a Dr Carlean Purl pt I will send to his nurse

## 2023-06-30 NOTE — Telephone Encounter (Signed)
Patient called requesting to speak with a nurse, said she was recently at the hospital and would like to discus  getting some medication. No other information provided.

## 2023-06-30 NOTE — Telephone Encounter (Signed)
Patient calling in requesting pain medication for the future if her pain returns (recent hospitalization for pancreatitis). Denies any current pain. Advised her that our providers do not typically order pain medication outpatient & since she is not currently having any pain/symptoms, and to give [place] a call back if symptoms return. She's very adamant that I discuss this with Dr. Leone Payor, and states they discussed this while she was in the hospital.

## 2023-07-08 DIAGNOSIS — H6122 Impacted cerumen, left ear: Secondary | ICD-10-CM | POA: Diagnosis not present

## 2023-07-19 ENCOUNTER — Other Ambulatory Visit: Payer: Self-pay | Admitting: Internal Medicine

## 2023-07-19 ENCOUNTER — Ambulatory Visit (HOSPITAL_COMMUNITY): Admission: RE | Admit: 2023-07-19 | Payer: Medicare Other | Source: Ambulatory Visit

## 2023-07-19 DIAGNOSIS — K838 Other specified diseases of biliary tract: Secondary | ICD-10-CM

## 2023-07-19 DIAGNOSIS — K8500 Idiopathic acute pancreatitis without necrosis or infection: Secondary | ICD-10-CM

## 2023-07-21 ENCOUNTER — Ambulatory Visit (INDEPENDENT_AMBULATORY_CARE_PROVIDER_SITE_OTHER): Payer: Medicare Other | Admitting: Internal Medicine

## 2023-07-21 ENCOUNTER — Encounter: Payer: Self-pay | Admitting: Internal Medicine

## 2023-07-21 VITALS — BP 120/60 | HR 61 | Ht 63.0 in | Wt 166.0 lb

## 2023-07-21 DIAGNOSIS — K5080 Crohn's disease of both small and large intestine without complications: Secondary | ICD-10-CM | POA: Diagnosis not present

## 2023-07-21 DIAGNOSIS — K851 Biliary acute pancreatitis without necrosis or infection: Secondary | ICD-10-CM | POA: Diagnosis not present

## 2023-07-21 DIAGNOSIS — Z7960 Long term (current) use of unspecified immunomodulators and immunosuppressants: Secondary | ICD-10-CM

## 2023-07-21 NOTE — Progress Notes (Signed)
Jade Hernandez 82 y.o. 1941-06-24 [id number]  Assessment & Plan:   Encounter Diagnoses  Name Primary?   Acute gallstone pancreatitis - resolved Yes   Crohn's disease of both small and large intestine without complication (HCC)    Long-term use of immunosuppressant medication _ Entyvio    She is improved overall.  Doing well with Crohn's and recovered from her gallstone pancreatitis.  She we will consider elective cholecystectomy but is not enthusiastic about it.  I told her that in general it is a standard of care to do that but she may do okay without any gallstone problems in the future, she did not have any seen on ultrasound or her MRI.  She will let me know if she wants a referral handouts about gallstones and laparoscopic cholecystectomy provided.  Continue Entyvio for her Crohn's disease.  She will come to do labs with CBC CMET lipase and QuantiFERON gold in December, she actually has an appointment on the books for that date which we will leave on for now though I think she could go with a 6-month follow-up.  She you can cancel that if it is not needed.  She may resume pravastatin.  CC: [name], MD   Subjective:  Gastroenterology summary:   Crohn's ileocolitis Colonoscopy 01/2019 acute changes not chronic-ileitis and nonspecific colitis changes on random biopsies - Tx x 2 mos budesonide 9 mg w/ success Recurrent problems, diagnosed with Crohn's disease required prednisone over budesonide in 2021 -CT abdomen pelvis with contrast 07/22/2020 mild to moderate enterocolitis with diffuse involvement of the distal ileum and colon -Entyvio begun 03/04/2021 and has done well on every 8 weeks 300 mg   Associated issues include B12 deficiency and iron deficiency anemia  Gallstone pancreatitis August 2024-admitted twice, originally no signs of stones though had dilated bile duct, returned within days and MRCP showed bile duct stones, status post ERCP and stone extraction by  Dr. Meridee Score.  Periampullary diverticulum.  Chief Complaint: Follow-up of pancreatitis and ongoing Crohn's disease  HPI This 82 year old [place] woman is here for follow-up for the above problems, she is doing well though occasionally has some postprandial defecation with some softer stools but no diarrhea, since her ERCP.  She had labs with Dr. Clelia Croft her hemoglobin was up to 11.5 lipase was 162 but that was not long after hospitalization i.e. 2 days later and it was 257 post 2 days prior.  She feels well overall.  Her daughter Darel Hong is here with her as well.  Crohn's is under control overall continuing with Entyvio.  She had not restarted her pravastatin which was stopped initially prior to understanding she had gallstone pancreatitis.  Wt Readings from Last 3 Encounters:  07/21/23 166 lb (75.3 kg)  06/25/23 171 lb 4.8 oz (77.7 kg)  06/18/23 171 lb 4.8 oz (77.7 kg)    Allergies  Allergen Reactions   Feraheme [Ferumoxytol] Shortness Of Breath and Palpitations    Severe pain all over   Current Meds  Medication Sig   acetaminophen (TYLENOL) 500 MG tablet Take 1,000 mg by mouth in the morning and at bedtime.   amLODipine (NORVASC) 2.5 MG tablet Take 1 tablet (2.5 mg total) by mouth daily.   cholecalciferol (VITAMIN D3) 25 MCG (1000 UNIT) tablet Take 1,000 Units by mouth daily.   metoprolol succinate (TOPROL-XL) 100 MG 24 hr tablet TAKE 1 TABLET(100 MG) BY MOUTH DAILY WITH OR IMMEDIATELY FOLLOWING A MEAL (Patient taking differently: Take 100 mg by mouth daily.)  Probiotic Product (PROBIOTIC DAILY PO) Take 1 capsule by mouth in the morning.   valsartan (DIOVAN) 160 MG tablet Take 1 tablet (160 mg total) by mouth daily.   vedolizumab (ENTYVIO) 300 MG injection Inject 300 mg into the vein every 2 (two) months.   Past Medical History:  Diagnosis Date   Adenomatous colon polyp    Arthritis    Crohn's disease of both small and large intestine with other complication (HCC) 10/23/2020    Diverticulosis    High cholesterol    Hypertension    IBS (irritable bowel syndrome)    Palpitations    Pre-diabetes    UTI (urinary tract infection)    Past Surgical History:  Procedure Laterality Date   BILATERAL CARPAL TUNNEL RELEASE     COLONOSCOPY     ERCP N/A 06/25/2023   Procedure: ENDOSCOPIC RETROGRADE CHOLANGIOPANCREATOGRAPHY (ERCP);  Surgeon: Lemar Lofty., MD;  Location[facility] ENDOSCOPY;  Service: Gastroenterology;  Laterality: N/A;   NM MYOCAR PERF WALL MOTION  10/02/2007   No significant ischemia demonstrated   REMOVAL OF STONES  06/25/2023   Procedure: REMOVAL OF STONES;  Surgeon: Meridee Score Netty Starring., MD;  Location: 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   Review of Systems As above  Objective:   Physical Exam BP 120/60   Pulse 61   Ht 5\' 3"  (1.6 m)   Wt 166 lb (75.3 kg)   BMI 29.41 kg/m  Well-developed  well-nourished no acute distress Eyes anicteric Lungs clear Heart sounds normal Abdomen is soft and nontender

## 2023-07-21 NOTE — Patient Instructions (Addendum)
Please come the first week of December to get labs drawn. No appointment is needed.  We are providing you with handouts to read on Gallstones and gallbladder removal by laparoscopy.  Great to see you today.  _______________________________________________________  If your blood pressure at your visit was 140/90 or greater, please contact your primary care physician to follow up on this.  _______________________________________________________  If you are age 30 or older, your body mass index should be between 23-30. Your Body mass index is 29.41 kg/m. If this is out of the aforementioned range listed, please consider follow up with your Primary Care Provider.  If you are age 42 or younger, your body mass index should be between 19-25. Your Body mass index is 29.41 kg/m. If this is out of the aformentioned range listed, please consider follow up with your Primary Care Provider.   ________________________________________________________  The [facility] providers would like to encourage you to use [facility] to communicate with providers for non-urgent requests or questions.  Due to long hold times on the telephone, sending your provider a message by [facility] may be a faster and more efficient way to get a response.  Please allow 48 business hours for a response.  Please remember that this is for non-urgent requests.  _______________________________________________________  I appreciate the opportunity to care for you. Stan Head, MD, [facility]

## 2023-07-23 NOTE — Progress Notes (Deleted)
Cardiology Clinic Note   Date: 07/23/2023 ID: Jade, Hernandez Mar 16, 1941, MRN [id number]  Primary Cardiologist:  Nicki Guadalajara, MD  Patient Profile    Jade Hernandez is a 82 y.o. female who presents to the clinic today for ***    Past medical history significant for: Aortic dilatation. Echo 02/07/2023: EF 60 to 65%.  Mild LVH.  Normal RV function.  Mild LAE.  Trivial MR.  Mild AI.  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 Hypertension. Hyperlipidemia. Lipid panel 06/19/2023: LDL 35, HDL 35, TG 84, total 87. Palpitations. GERD. Crohn's disease. Vedolizumab infusions every 8 weeks.      History of Present Illness    Jade Hernandez is a longtime patient of cardiology. She is followed by Dr. Tresa Endo for the above outlined history.   Patient was last seen in the office by me on 04/27/2023 to discuss going back on amlodipine.  Amlodipine was stopped secondary to low BP during hospitalization for knee replacement.  Recent BP readings mostly  >130/80.  She reported physical therapist was concerned about R>L lower extremity edema however both she and her daughter agreed extremities unchanged from previous.  He was restarted on amlodipine.  Patient underwent hospital admission from 06/18/2023 to 06/21/2023 for pancreatitis.  She was instructed to follow-up with GI for MRCP.  Home statin was held secondary to possible cause of acute pancreatitis.  She was readmitted to the hospital on 06/22/2023 until 06/26/2023 for continued abdominal pain.  She underwent ERCP.  She was instructed to follow-up with surgery as an outpatient to discuss cholecystectomy.  Today, patient ***  Hypertension. BP today *** Patient denies headaches, dizziness or vision changes. Continue amlodipine, metoprolol, valsartan. Hyperlipidemia.  LDL August 2024 35.  Statin recently discontinued secondary to pancreatitis.  LFTs 06/23/2023 during hospital admission showed AST 150, ALT 147.  Repeat labs 06/26/2023 showed AST 25, ALT  61.***   ROS: All other systems reviewed and are otherwise negative except as noted in History of Present Illness.  Studies Reviewed       ***  Risk Assessment/Calculations    {Does this patient have ATRIAL FIBRILLATION?[phone]} No BP recorded.  {Refresh Note OR Click here to enter BP  :1}***        Physical Exam    VS:  There were no vitals taken for this visit. , BMI There is no height or weight on file to calculate BMI.  GEN: Well nourished, well developed, in no acute distress. Neck: No JVD or carotid bruits. Cardiac: *** RRR. No murmurs. No rubs or gallops.   Respiratory:  Respirations regular and unlabored. Clear to auscultation without rales, wheezing or rhonchi. GI: Soft, nontender, nondistended. Extremities: Radials/DP/PT 2+ and equal bilaterally. No clubbing or cyanosis. No edema ***  Skin: Warm and dry, no rash. Neuro: Strength intact.  Assessment & Plan   ***  Disposition: ***     {Are you ordering a CV Procedure (e.g. stress test, cath, DCCV, TEE, etc)?   Press F2        :[id number]}   Signed, Etta Grandchild. Ramani Riva, DNP, NP-C

## 2023-07-26 ENCOUNTER — Ambulatory Visit: Payer: Medicare Other | Attending: Student | Admitting: Student

## 2023-07-27 ENCOUNTER — Ambulatory Visit (INDEPENDENT_AMBULATORY_CARE_PROVIDER_SITE_OTHER): Payer: Medicare Other

## 2023-07-27 VITALS — BP 144/88 | HR 65 | Temp 97.4°F | Resp 18 | Ht 63.0 in | Wt 170.0 lb

## 2023-07-27 DIAGNOSIS — K50818 Crohn's disease of both small and large intestine with other complication: Secondary | ICD-10-CM

## 2023-07-27 MED ORDER — VEDOLIZUMAB 300 MG IV SOLR
300.0000 mg | Freq: Once | INTRAVENOUS | Status: AC
  Administered 2023-07-27: 300 mg via INTRAVENOUS
  Filled 2023-07-27: qty 5

## 2023-07-27 NOTE — Progress Notes (Signed)
Diagnosis: Crohn's Disease  Provider:  Chilton Greathouse MD  Procedure: IV Infusion  IV Type: Peripheral, IV Location: L Forearm  Entyvio (Vedolizumab), Dose: 300 mg  Infusion Start Time: 1401  Infusion Stop Time: 1433  Post Infusion IV Care: Peripheral IV Discontinued  Discharge: Condition: Good, Destination: Home . AVS Declined  Performed by:  Garnette Czech, RN

## 2023-08-02 ENCOUNTER — Ambulatory Visit: Payer: Medicare Other

## 2023-08-05 ENCOUNTER — Ambulatory Visit: Payer: Medicare Other | Admitting: Gastroenterology

## 2023-08-08 DIAGNOSIS — Z01419 Encounter for gynecological examination (general) (routine) without abnormal findings: Secondary | ICD-10-CM | POA: Diagnosis not present

## 2023-08-08 DIAGNOSIS — L292 Pruritus vulvae: Secondary | ICD-10-CM | POA: Diagnosis not present

## 2023-08-08 DIAGNOSIS — Z6831 Body mass index (BMI) 31.0-31.9, adult: Secondary | ICD-10-CM | POA: Diagnosis not present

## 2023-08-10 DIAGNOSIS — M79621 Pain in right upper arm: Secondary | ICD-10-CM | POA: Diagnosis not present

## 2023-08-10 DIAGNOSIS — M79622 Pain in left upper arm: Secondary | ICD-10-CM | POA: Diagnosis not present

## 2023-08-17 DIAGNOSIS — M19011 Primary osteoarthritis, right shoulder: Secondary | ICD-10-CM | POA: Diagnosis not present

## 2023-08-17 DIAGNOSIS — M19012 Primary osteoarthritis, left shoulder: Secondary | ICD-10-CM | POA: Diagnosis not present

## 2023-08-24 DIAGNOSIS — M19011 Primary osteoarthritis, right shoulder: Secondary | ICD-10-CM | POA: Diagnosis not present

## 2023-09-08 ENCOUNTER — Telehealth: Payer: Self-pay | Admitting: Internal Medicine

## 2023-09-08 NOTE — Telephone Encounter (Signed)
Patient called in with concern for lower back pain that started yesterday. She has taken some tylenol which helped some, but not entirely. She's unsure if she could have lifted anything too heavy or did anything out of her usual routine. She talked with PCP yesterday. Advised her to call and follow up with PCP for further recommendations, and if she develops any new GI symptoms or has further questions then to call back. Pt verbalized all understanding.

## 2023-09-08 NOTE — Telephone Encounter (Signed)
Inbound call from patient, states she is having back pain and would like to be seen ASAP

## 2023-09-09 DIAGNOSIS — K8020 Calculus of gallbladder without cholecystitis without obstruction: Secondary | ICD-10-CM | POA: Diagnosis not present

## 2023-09-09 DIAGNOSIS — M549 Dorsalgia, unspecified: Secondary | ICD-10-CM | POA: Diagnosis not present

## 2023-09-09 DIAGNOSIS — K50019 Crohn's disease of small intestine with unspecified complications: Secondary | ICD-10-CM | POA: Diagnosis not present

## 2023-09-09 LAB — LAB REPORT - SCANNED: EGFR: 97

## 2023-09-12 DIAGNOSIS — M8588 Other specified disorders of bone density and structure, other site: Secondary | ICD-10-CM | POA: Diagnosis not present

## 2023-09-21 ENCOUNTER — Ambulatory Visit: Payer: Medicare Other

## 2023-09-21 VITALS — BP 151/77 | HR 63 | Temp 97.6°F | Resp 18 | Ht 63.0 in | Wt 173.8 lb

## 2023-09-21 DIAGNOSIS — K50818 Crohn's disease of both small and large intestine with other complication: Secondary | ICD-10-CM | POA: Diagnosis not present

## 2023-09-21 MED ORDER — VEDOLIZUMAB 300 MG IV SOLR
300.0000 mg | Freq: Once | INTRAVENOUS | Status: AC
Start: 2023-09-21 — End: 2023-09-21
  Administered 2023-09-21: 300 mg via INTRAVENOUS
  Filled 2023-09-21: qty 5

## 2023-09-21 NOTE — Progress Notes (Signed)
Diagnosis: Crohn's Disease  Provider:  Chilton Greathouse MD  Procedure: IV Infusion  IV Type: Peripheral, IV Location: L Antecubital  Entyvio (Vedolizumab), Dose: 300 mg  Infusion Start Time: 1358  Infusion Stop Time: 1441  Post Infusion IV Care: PIV discontinue.  Discharge: Condition: Good, Destination: Home . AVS Provided  Performed by:  Wyvonne Lenz, RN

## 2023-09-30 DIAGNOSIS — N3000 Acute cystitis without hematuria: Secondary | ICD-10-CM | POA: Diagnosis not present

## 2023-10-03 DIAGNOSIS — M19012 Primary osteoarthritis, left shoulder: Secondary | ICD-10-CM | POA: Diagnosis not present

## 2023-10-12 DIAGNOSIS — M810 Age-related osteoporosis without current pathological fracture: Secondary | ICD-10-CM | POA: Diagnosis not present

## 2023-10-21 ENCOUNTER — Other Ambulatory Visit (INDEPENDENT_AMBULATORY_CARE_PROVIDER_SITE_OTHER): Payer: Medicare Other

## 2023-10-21 DIAGNOSIS — K851 Biliary acute pancreatitis without necrosis or infection: Secondary | ICD-10-CM

## 2023-10-21 DIAGNOSIS — K5080 Crohn's disease of both small and large intestine without complications: Secondary | ICD-10-CM | POA: Diagnosis not present

## 2023-10-21 LAB — COMPREHENSIVE METABOLIC PANEL
ALT: 8 U/L (ref 0–35)
AST: 11 U/L (ref 0–37)
Albumin: 3.8 g/dL (ref 3.5–5.2)
Alkaline Phosphatase: 66 U/L (ref 39–117)
BUN: 19 mg/dL (ref 6–23)
CO2: 26 meq/L (ref 19–32)
Calcium: 9.2 mg/dL (ref 8.4–10.5)
Chloride: 105 meq/L (ref 96–112)
Creatinine, Ser: 0.46 mg/dL (ref 0.40–1.20)
GFR: 90.21 mL/min (ref >60.00)
Glucose, Bld: 92 mg/dL (ref 70–99)
Potassium: 4 meq/L (ref 3.5–5.1)
Sodium: 138 meq/L (ref 135–145)
Total Bilirubin: 0.6 mg/dL (ref 0.2–1.2)
Total Protein: 6.5 g/dL (ref 6.0–8.3)

## 2023-10-21 LAB — CBC WITH DIFFERENTIAL/PLATELET
Basophils Absolute: 0.1 10*3/uL (ref 0.0–0.1)
Basophils Relative: 1.2 % (ref 0.0–3.0)
Eosinophils Absolute: 0.2 10*3/uL (ref 0.0–0.7)
Eosinophils Relative: 2.9 % (ref 0.0–5.0)
HCT: 38.3 % (ref 36.0–46.0)
Hemoglobin: 12.4 g/dL (ref 12.0–15.0)
Lymphocytes Relative: 17.5 % (ref 12.0–46.0)
Lymphs Abs: 1 10*3/uL (ref 0.7–4.0)
MCHC: 32.5 g/dL (ref 30.0–36.0)
MCV: 79.4 fL (ref 78.0–100.0)
Monocytes Absolute: 0.5 10*3/uL (ref 0.1–1.0)
Monocytes Relative: 8.6 % (ref 3.0–12.0)
Neutro Abs: 4 10*3/uL (ref 1.4–7.7)
Neutrophils Relative %: 69.8 % (ref 43.0–77.0)
Platelets: 217 10*3/uL (ref 150.0–400.0)
RBC: 4.82 Mil/uL (ref 3.87–5.11)
RDW: 16.4 % — ABNORMAL HIGH (ref 11.5–15.5)
WBC: 5.8 10*3/uL (ref 4.0–10.5)

## 2023-10-21 LAB — LIPASE: Lipase: 37 U/L (ref 11.0–59.0)

## 2023-10-24 ENCOUNTER — Ambulatory Visit: Payer: Medicare Other | Admitting: Internal Medicine

## 2023-10-25 LAB — QUANTIFERON-TB GOLD PLUS
Mitogen-NIL: 1.34 [IU]/mL
NIL: 0.04 [IU]/mL
QuantiFERON-TB Gold Plus: NEGATIVE
TB1-NIL: 0.01 [IU]/mL
TB2-NIL: 0.01 [IU]/mL

## 2023-10-26 ENCOUNTER — Ambulatory Visit: Payer: Medicare Other | Admitting: Physical Therapy

## 2023-11-17 ENCOUNTER — Ambulatory Visit: Payer: Medicare Other

## 2023-11-17 VITALS — BP 131/60 | HR 68 | Temp 97.8°F | Resp 16 | Ht 63.0 in | Wt 173.0 lb

## 2023-11-17 DIAGNOSIS — K50818 Crohn's disease of both small and large intestine with other complication: Secondary | ICD-10-CM | POA: Diagnosis not present

## 2023-11-17 MED ORDER — SODIUM CHLORIDE 0.9 % IV SOLN
300.0000 mg | Freq: Once | INTRAVENOUS | Status: AC
  Administered 2023-11-17: 300 mg via INTRAVENOUS
  Filled 2023-11-17: qty 5

## 2023-11-17 NOTE — Progress Notes (Signed)
 Diagnosis: Crohn's Disease  Provider:  Praveen Mannam MD  Procedure: IV Infusion  IV Type: Peripheral, IV Location: L Antecubital  Entyvio  (Vedolizumab ), Dose: 300 mg  Infusion Start Time: 1430  Infusion Stop Time: 1505  Post Infusion IV Care: Peripheral IV Discontinued  Discharge: Condition: Good, Destination: Home . AVS Provided  Performed by:  Leita FORBES Miles, LPN

## 2023-11-24 ENCOUNTER — Telehealth: Payer: Self-pay

## 2023-11-24 NOTE — Telephone Encounter (Signed)
  Auth Submission: NO AUTH NEEDED Payer: MEDICARE A/B & BCBS SUPP Medication & CPT/J Code(s) submitted: Entyvio  (Vedolizumab ) J3380 Route of submission (phone, fax, portal):  Phone # Fax # Auth type: Buy/Bill Units/visits requested: 7 VISITS Reference number:  Approval from: 11/24/22 to 12/15/24

## 2023-12-12 ENCOUNTER — Other Ambulatory Visit: Payer: Self-pay | Admitting: Cardiovascular Disease

## 2023-12-26 DIAGNOSIS — I10 Essential (primary) hypertension: Secondary | ICD-10-CM | POA: Diagnosis not present

## 2023-12-26 DIAGNOSIS — R7303 Prediabetes: Secondary | ICD-10-CM | POA: Diagnosis not present

## 2023-12-26 DIAGNOSIS — D509 Iron deficiency anemia, unspecified: Secondary | ICD-10-CM | POA: Diagnosis not present

## 2023-12-26 DIAGNOSIS — E782 Mixed hyperlipidemia: Secondary | ICD-10-CM | POA: Diagnosis not present

## 2023-12-26 DIAGNOSIS — E559 Vitamin D deficiency, unspecified: Secondary | ICD-10-CM | POA: Diagnosis not present

## 2023-12-26 DIAGNOSIS — M1990 Unspecified osteoarthritis, unspecified site: Secondary | ICD-10-CM | POA: Diagnosis not present

## 2023-12-26 DIAGNOSIS — Z0000 Encounter for general adult medical examination without abnormal findings: Secondary | ICD-10-CM | POA: Diagnosis not present

## 2023-12-26 DIAGNOSIS — J309 Allergic rhinitis, unspecified: Secondary | ICD-10-CM | POA: Diagnosis not present

## 2023-12-26 DIAGNOSIS — E669 Obesity, unspecified: Secondary | ICD-10-CM | POA: Diagnosis not present

## 2023-12-26 DIAGNOSIS — K50019 Crohn's disease of small intestine with unspecified complications: Secondary | ICD-10-CM | POA: Diagnosis not present

## 2023-12-26 DIAGNOSIS — M810 Age-related osteoporosis without current pathological fracture: Secondary | ICD-10-CM | POA: Diagnosis not present

## 2024-01-16 ENCOUNTER — Ambulatory Visit: Payer: Medicare Other

## 2024-01-23 ENCOUNTER — Ambulatory Visit (INDEPENDENT_AMBULATORY_CARE_PROVIDER_SITE_OTHER)

## 2024-01-23 VITALS — BP 144/77 | HR 65 | Temp 98.1°F | Resp 16 | Ht 63.0 in | Wt 173.8 lb

## 2024-01-23 DIAGNOSIS — K50818 Crohn's disease of both small and large intestine with other complication: Secondary | ICD-10-CM | POA: Diagnosis not present

## 2024-01-23 MED ORDER — VEDOLIZUMAB 300 MG IV SOLR
300.0000 mg | Freq: Once | INTRAVENOUS | Status: AC
  Administered 2024-01-23: 300 mg via INTRAVENOUS
  Filled 2024-01-23: qty 5

## 2024-01-23 NOTE — Progress Notes (Signed)
 Diagnosis: Crohn's Disease  Provider:  Chilton Greathouse MD  Procedure: IV Infusion  IV Type: Peripheral, IV Location: L Forearm  Entyvio (Vedolizumab), Dose: 300 mg  Infusion Start Time: 1357  Infusion Stop Time: 1438  Post Infusion IV Care: Peripheral IV Discontinued  Discharge: Condition: Good, Destination: Home . AVS Provided  Performed by:  Nat Math, RN

## 2024-02-07 ENCOUNTER — Encounter: Payer: Self-pay | Admitting: Internal Medicine

## 2024-02-07 ENCOUNTER — Ambulatory Visit (INDEPENDENT_AMBULATORY_CARE_PROVIDER_SITE_OTHER): Payer: Medicare Other | Admitting: Internal Medicine

## 2024-02-07 VITALS — BP 124/70 | HR 87 | Ht 63.0 in | Wt 174.0 lb

## 2024-02-07 DIAGNOSIS — D500 Iron deficiency anemia secondary to blood loss (chronic): Secondary | ICD-10-CM | POA: Diagnosis not present

## 2024-02-07 DIAGNOSIS — K5080 Crohn's disease of both small and large intestine without complications: Secondary | ICD-10-CM | POA: Diagnosis not present

## 2024-02-07 DIAGNOSIS — Z7960 Long term (current) use of unspecified immunomodulators and immunosuppressants: Secondary | ICD-10-CM

## 2024-02-07 NOTE — Patient Instructions (Signed)
 Great seeing you today.  _______________________________________________________  If your blood pressure at your visit was 140/90 or greater, please contact your primary care physician to follow up on this.  _______________________________________________________  If you are age 81 or older, your body mass index should be between 23-30. Your Body mass index is 30.82 kg/m. If this is out of the aforementioned range listed, please consider follow up with your Primary Care Provider.  If you are age 54 or younger, your body mass index should be between 19-25. Your Body mass index is 30.82 kg/m. If this is out of the aformentioned range listed, please consider follow up with your Primary Care Provider.   ________________________________________________________  The [facility] providers would like to encourage you to use [facility] to communicate with providers for non-urgent requests or questions.  Due to long hold times on the telephone, sending your provider a message by [facility] may be a faster and more efficient way to get a response.  Please allow 48 business hours for a response.  Please remember that this is for non-urgent requests.  _______________________________________________________  I will call you to set up a Nov/Dec appointment.  I appreciate the opportunity to care for you. Stan Head, MD, [facility]

## 2024-02-07 NOTE — Progress Notes (Signed)
 Jade Hernandez 82 y.o. Oct 10, 1941 [id number]  Assessment & Plan:   Encounter Diagnoses  Name Primary?   Crohn's disease of both small and large intestine without complication (HCC) Yes   Long-term use of immunosuppressant medication _ Entyvio    Iron deficiency anemia due to chronic blood loss    The patient is doing well.  We will continue current therapy with Entyvio (vedolizumab) 300 mg IV every 8 weeks.  We talked about repeating a colonoscopy and have decided to follow her clinically.  Same for drug levels at this point.  She does have some softer loose stools occasionally it does not sound like it is very frequent once or twice a month and very self-limited.  This has been ongoing over time.  This does not sound like disease flaring to me.  She does note that if she drinks milk this may happen so she could certainly have some lactose intolerance.  We will monitor and if she has persistent changes in bowel habits she knows to call me.  As of December her iron deficiency anemia had resolved.  She has follow-up with primary care later this year and CBC can be rechecked then.  Check iron levels would also check B12 level later this year.  Follow-up me in November or December 2025.   CC: [name], MD    Subjective:  Gastroenterology summary:   Crohn's ileocolitis Colonoscopy 01/2019 acute changes not chronic-ileitis and nonspecific colitis changes on random biopsies - Tx x 2 mos budesonide 9 mg w/ success Recurrent problems, diagnosed with Crohn's disease required prednisone over budesonide in 2021 -CT abdomen pelvis with contrast 07/22/2020 mild to moderate enterocolitis with diffuse involvement of the distal ileum and colon -Entyvio begun 03/04/2021 and has done well on every 8 weeks 300 mg   Associated issues include  iron deficiency anemia   Gallstone pancreatitis August 2024-admitted twice, originally no signs of stones though had dilated bile duct, returned  within days and MRCP showed bile duct stones, status post ERCP and stone extraction by Dr. Meridee Score.  Periampullary diverticulum.  ------------------------------------------------------------------------------------------------  Chief Complaint: Crohn's disease  HPI 83 year old woman, soon[age]to-be 89, here with daughter for follow-up of Crohn's ileocolitis.  History as above.  She is doing well with occasional softer stools which she cannot track to any specific dietary change.  Overall she does well with formed stools no bleeding or abdominal pain.  She continues on Entyvio 300 mg every 8 weeks.  Her husband passed away the other month, succumbing to pancreatic cancer after 4 years.  Wt Readings from Last 3 Encounters:  02/07/24 174 lb (78.9 kg)  01/23/24 173 lb 12.8 oz (78.8 kg)  11/17/23 173 lb (78.5 kg)    Lab Results  Component Value Date   WBC 5.8 10/21/2023   HGB 12.4 10/21/2023   HCT 38.3 10/21/2023   MCV 79.4 10/21/2023   PLT 217.0 10/21/2023   Lab Results  Component Value Date   FERRITIN 110 06/23/2023     Allergies  Allergen Reactions   Feraheme [Ferumoxytol] Shortness Of Breath and Palpitations    Severe pain all over   Current Meds  Medication Sig   acetaminophen (TYLENOL) 500 MG tablet Take 1,000 mg by mouth in the morning and at bedtime.   amLODipine (NORVASC) 2.5 MG tablet Take 1 tablet (2.5 mg total) by mouth daily.   cholecalciferol (VITAMIN D3) 25 MCG (1000 UNIT) tablet Take 1,000 Units by mouth daily.   metoprolol succinate (TOPROL-XL) 100 MG  24 hr tablet TAKE 1 TABLET(100 MG) BY MOUTH DAILY WITH OR IMMEDIATELY FOLLOWING A MEAL (Patient taking differently: Take 100 mg by mouth daily.)   Probiotic Product (PROBIOTIC DAILY PO) Take 1 capsule by mouth in the morning.   valsartan (DIOVAN) 160 MG tablet TAKE 1 TABLET(160 MG) BY MOUTH DAILY   vedolizumab (ENTYVIO) 300 MG injection Inject 300 mg into the vein every 2 (two) months.   Past Medical History:   Diagnosis Date   Acute gallstone pancreatitis 06/23/2023   Adenomatous colon polyp    Arthritis    Choledocholithiasis 06/25/2023   Crohn's disease of both small and large intestine with other complication (HCC) 10/23/2020   Diverticulosis    High cholesterol    Hypertension    IBS (irritable bowel syndrome)    Palpitations    Pre-diabetes    UTI (urinary tract infection)    Past Surgical History:  Procedure Laterality Date   BILATERAL CARPAL TUNNEL RELEASE     COLONOSCOPY     ERCP N/A 06/25/2023   Procedure: ENDOSCOPIC RETROGRADE CHOLANGIOPANCREATOGRAPHY (ERCP);  Surgeon: Lemar Lofty., MD;  Location: [facility] ENDOSCOPY;  Service: Gastroenterology;  Laterality: N/A;   NM MYOCAR PERF WALL MOTION  10/02/2007   No significant ischemia demonstrated   REMOVAL OF STONES  06/25/2023   Procedure: REMOVAL OF STONES;  Surgeon: Meridee Score Netty Starring., MD;  Location: [facility] ENDOSCOPY;  Service: Gastroenterology;;   Dennison Mascot  06/25/2023   Procedure: Dennison Mascot;  Surgeon: Lemar Lofty., MD;  Location: [facility] ENDOSCOPY;  Service: Gastroenterology;;   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   Review of Systems As per HPI  Objective:   Physical Exam @BP  124/70   Pulse 87   Ht 5\' 3"  (1.6 m)   Wt 174 lb (78.9 kg)   BMI 30.82 kg/m @  General:  NAD Eyes:   anicteric Lungs:  clear Heart::  S1S2 no rubs, murmurs or gallops Abdomen:  soft and nontender, BS+     Data Reviewed:  See HPI and summary

## 2024-02-16 ENCOUNTER — Telehealth: Payer: Self-pay | Admitting: Internal Medicine

## 2024-02-16 DIAGNOSIS — K50819 Crohn's disease of both small and large intestine with unspecified complications: Secondary | ICD-10-CM

## 2024-02-16 NOTE — Telephone Encounter (Signed)
 The pt has been advised and will come in as soon as she is able.  Order entered

## 2024-02-16 NOTE — Telephone Encounter (Signed)
 Inbound call from patient stating that her stools are still not normal and Dr. Leone Payor wanted her to call back if stool did not change  and is requesting a call back to discuss. Please advise.

## 2024-02-16 NOTE — Telephone Encounter (Signed)
 Please have her collect a fecal calprotectin  Encounter Diagnosis  Name Primary?   Crohn's disease of small and large intestines with complication (HCC) Yes

## 2024-02-16 NOTE — Telephone Encounter (Signed)
 The pt was last seen on 3/25 and states she was told by Dr Leone Payor to call with an update.  She states she still has the same symptoms with loose stools. No change- no worse but no better. FYI Dr Leone Payor

## 2024-02-17 ENCOUNTER — Other Ambulatory Visit

## 2024-02-17 DIAGNOSIS — K50819 Crohn's disease of both small and large intestine with unspecified complications: Secondary | ICD-10-CM | POA: Diagnosis not present

## 2024-02-19 LAB — CALPROTECTIN, FECAL: Calprotectin, Fecal: 130 ug/g — ABNORMAL HIGH (ref 0–120)

## 2024-02-21 ENCOUNTER — Other Ambulatory Visit: Payer: Self-pay | Admitting: Internal Medicine

## 2024-02-21 DIAGNOSIS — K50819 Crohn's disease of both small and large intestine with unspecified complications: Secondary | ICD-10-CM

## 2024-02-21 DIAGNOSIS — Z7960 Long term (current) use of unspecified immunomodulators and immunosuppressants: Secondary | ICD-10-CM

## 2024-02-23 ENCOUNTER — Telehealth: Payer: Self-pay | Admitting: Internal Medicine

## 2024-02-23 NOTE — Telephone Encounter (Signed)
 The pt wants to understand why her labs need to be drawn in May and not now. I was able to speak with the pt and explain that her labs need to be drawn so that we can get an accurate view of the trough level just prior to infusion.   The pt has been advised of the information and verbalized understanding.

## 2024-02-23 NOTE — Telephone Encounter (Signed)
 Patient called and stated she would like to discuss with they nurse about her appointment for her infusion. Patient is requesting a call back. Please advise.

## 2024-02-27 DIAGNOSIS — Z1231 Encounter for screening mammogram for malignant neoplasm of breast: Secondary | ICD-10-CM | POA: Diagnosis not present

## 2024-03-08 DIAGNOSIS — K50019 Crohn's disease of small intestine with unspecified complications: Secondary | ICD-10-CM | POA: Diagnosis not present

## 2024-03-08 DIAGNOSIS — F4321 Adjustment disorder with depressed mood: Secondary | ICD-10-CM | POA: Diagnosis not present

## 2024-03-08 DIAGNOSIS — R42 Dizziness and giddiness: Secondary | ICD-10-CM | POA: Diagnosis not present

## 2024-03-16 ENCOUNTER — Other Ambulatory Visit

## 2024-03-16 DIAGNOSIS — Z7960 Long term (current) use of unspecified immunomodulators and immunosuppressants: Secondary | ICD-10-CM | POA: Diagnosis not present

## 2024-03-16 DIAGNOSIS — K50819 Crohn's disease of both small and large intestine with unspecified complications: Secondary | ICD-10-CM | POA: Diagnosis not present

## 2024-03-19 ENCOUNTER — Ambulatory Visit

## 2024-03-19 VITALS — BP 130/74 | HR 60 | Temp 98.0°F | Resp 16 | Ht 63.0 in | Wt 175.6 lb

## 2024-03-19 DIAGNOSIS — K50818 Crohn's disease of both small and large intestine with other complication: Secondary | ICD-10-CM | POA: Diagnosis not present

## 2024-03-19 MED ORDER — VEDOLIZUMAB 300 MG IV SOLR
300.0000 mg | Freq: Once | INTRAVENOUS | Status: AC
  Administered 2024-03-19: 300 mg via INTRAVENOUS
  Filled 2024-03-19: qty 5

## 2024-03-19 NOTE — Progress Notes (Signed)
 Diagnosis: Crohn's Disease  Provider:  Praveen Mannam MD  Procedure: IV Infusion  IV Type: Peripheral, IV Location: L Antecubital  Entyvio  (Vedolizumab ), Dose: 300 mg  Infusion Start Time: 1001  Infusion Stop Time: 1036  Post Infusion IV Care: Peripheral IV Discontinued  Discharge: Condition: Good, Destination: Home . AVS Provided  Performed by:  Star East, LPN

## 2024-03-28 LAB — VEDOLIZUMAB AND ANTI-VEDO AB: Vedolizumab: 13 ug/mL

## 2024-03-28 LAB — SERIAL MONITORING

## 2024-03-30 ENCOUNTER — Ambulatory Visit: Payer: Self-pay | Admitting: Internal Medicine

## 2024-03-30 ENCOUNTER — Other Ambulatory Visit: Payer: Self-pay | Admitting: Internal Medicine

## 2024-03-30 DIAGNOSIS — K50819 Crohn's disease of both small and large intestine with unspecified complications: Secondary | ICD-10-CM

## 2024-04-03 ENCOUNTER — Other Ambulatory Visit

## 2024-04-03 DIAGNOSIS — K50819 Crohn's disease of both small and large intestine with unspecified complications: Secondary | ICD-10-CM | POA: Diagnosis not present

## 2024-04-05 ENCOUNTER — Other Ambulatory Visit (HOSPITAL_COMMUNITY): Payer: Self-pay

## 2024-04-05 ENCOUNTER — Encounter: Payer: Self-pay | Admitting: Internal Medicine

## 2024-04-05 ENCOUNTER — Ambulatory Visit: Payer: Self-pay | Admitting: Internal Medicine

## 2024-04-05 ENCOUNTER — Telehealth: Payer: Self-pay | Admitting: Internal Medicine

## 2024-04-05 DIAGNOSIS — H6121 Impacted cerumen, right ear: Secondary | ICD-10-CM | POA: Diagnosis not present

## 2024-04-05 DIAGNOSIS — K50819 Crohn's disease of both small and large intestine with unspecified complications: Secondary | ICD-10-CM

## 2024-04-05 LAB — CALPROTECTIN, FECAL: Calprotectin, Fecal: 275 ug/g — ABNORMAL HIGH (ref 0–120)

## 2024-04-05 MED ORDER — BUDESONIDE 3 MG PO CPEP: 9.0000 mg | ORAL_CAPSULE | Freq: Every day | ORAL | 1 refills | Status: DC

## 2024-04-05 NOTE — Telephone Encounter (Signed)
 Patient is calling regarding the Budesonide  3 MG that was sent over to her pharmacy today. Patient is requesting a call back. Please advise.

## 2024-04-05 NOTE — Telephone Encounter (Signed)
 Returned call to patient. She states that she called [facility] to get the medication and they told her it would need a prior authorization. Will send message to pharmacy to start PA for medication, Budesonide  3mg  capsule.

## 2024-04-06 ENCOUNTER — Encounter: Payer: Self-pay | Admitting: Internal Medicine

## 2024-04-06 ENCOUNTER — Other Ambulatory Visit (HOSPITAL_COMMUNITY): Payer: Self-pay

## 2024-04-06 ENCOUNTER — Telehealth: Payer: Self-pay

## 2024-04-06 NOTE — Telephone Encounter (Signed)
 Pharmacy Patient Advocate Encounter  Received notification from [facility] Medicare that Prior Authorization for Budesonide  3MG  dr capsules has been APPROVED from 04-06-2024 to 04-06-2025   PA #/Case ID/Reference #: [id number]

## 2024-04-06 NOTE — Telephone Encounter (Signed)
 Inbound call from [employer] advising they will fax medication approval for budesonide .   Effective from march of 2025 to February 04 2025.

## 2024-04-06 NOTE — Telephone Encounter (Signed)
 Pharmacy Patient Advocate Encounter   Received notification from Pt Calls Messages that prior authorization for Budesonide  3MG  dr capsules is required/requested.   Insurance verification completed.   The patient is insured through [employer].   Per test claim: PA required; PA submitted to above mentioned insurance via CoverMyMeds Key/confirmation #/EOC [facility] Status is pending

## 2024-04-06 NOTE — Telephone Encounter (Signed)
 Noted pt has been advised

## 2024-04-07 ENCOUNTER — Other Ambulatory Visit: Payer: Self-pay | Admitting: Nurse Practitioner

## 2024-04-09 ENCOUNTER — Telehealth: Payer: Self-pay | Admitting: Gastroenterology

## 2024-04-09 NOTE — Telephone Encounter (Signed)
 Received page from on-call service that she is having diarrhea and waiting for prescription approval.  Upon calling number back that was provided [phone]) and also calling home phone that was in chart there was no answer and a voice message was left.

## 2024-04-11 ENCOUNTER — Telehealth: Payer: Self-pay | Admitting: Internal Medicine

## 2024-04-11 NOTE — Telephone Encounter (Signed)
 Patient had reached out 2 days ago and we could not contact her in follow-up.  She actually left a voicemail on my cell phone that I saw today.  She was having diarrhea on Monday, she did pick up the budesonide  and started.  I reviewed the plan to take 9 mg budesonide  daily and to follow-up at the end of August at which point we would make a determination about next steps.  She has a slightly subtherapeutic vedolizumab  level so we are considering going to monthly therapy.

## 2024-04-13 DIAGNOSIS — Z471 Aftercare following joint replacement surgery: Secondary | ICD-10-CM | POA: Diagnosis not present

## 2024-04-13 DIAGNOSIS — Z96651 Presence of right artificial knee joint: Secondary | ICD-10-CM | POA: Diagnosis not present

## 2024-05-14 ENCOUNTER — Other Ambulatory Visit: Payer: Self-pay | Admitting: Nurse Practitioner

## 2024-05-15 ENCOUNTER — Ambulatory Visit

## 2024-05-15 VITALS — BP 155/79 | HR 62 | Temp 98.5°F | Resp 16 | Ht 63.0 in | Wt 179.0 lb

## 2024-05-15 DIAGNOSIS — K50818 Crohn's disease of both small and large intestine with other complication: Secondary | ICD-10-CM | POA: Diagnosis not present

## 2024-05-15 MED ORDER — VEDOLIZUMAB 300 MG IV SOLR
300.0000 mg | Freq: Once | INTRAVENOUS | Status: AC
  Administered 2024-05-15: 300 mg via INTRAVENOUS
  Filled 2024-05-15: qty 5

## 2024-05-15 NOTE — Progress Notes (Signed)
 Diagnosis: Crohn's Disease  Provider:  Praveen Mannam MD  Procedure: IV Infusion  IV Type: Peripheral, IV Location: L Antecubital  Entyvio  (Vedolizumab ), Dose: 300 mg  Infusion Start Time: 1007  Infusion Stop Time: 1044  Post Infusion IV Care: Peripheral IV Discontinued  Discharge: Condition: Good, Destination: Home . AVS Provided  Performed by:  Leita FORBES Miles, LPN

## 2024-05-17 ENCOUNTER — Telehealth: Payer: Self-pay | Admitting: Student

## 2024-05-17 NOTE — Telephone Encounter (Signed)
 Call back to patient to discuss concerns about blood pressure.   Patient states she usually never checks her blood pressure at home, but when she was getting her Entyvio  injection on 7/2 she noticed her BP was elevated, but cannot remember the reading.  She states she takes her meds around 7 AM every morning, including her 2.5 mg amlodipine  daily, 100 mg toprol  daily and 160 mg diovan  daily. She states she took these medications yesterday 05/17/24 at 7 AM and then checked her BP at home one hour later at 8 AM. Her BP was 161/86 and 155/85 on recheck shortly thereafter. She denies any headache, blurry vision or weakness on 05/16/24.  She states she checked her BP at 3:30 pm yesterday 05/17/24 and it was 157/57. She states that last night at bedtime she took an extra 2.5 mg of amlodipine  due to her concern about her BP but did not check her BP at that time. She denies any headache, blurry vision, or weakness on 05/17/24.  She states today 05/17/24 she took her medications at 7 AM and at 8 AM checked her BP. She reports her readings were 137/81 and then 121/71 a few mins later.  She denies any lightheadedness, dizziness.   At last office visit with D. Wittenborn on 04/27/23 her BP was 132/86 in office. Advised patient that her BP will probably be elevated during her infusions due to normal body responses to infusions. Also advised that taking blood pressure readings 1 hour after taking blood pressure medications is too soon to get an accurate idea of how well her medications are working, asked that patient check her BP no earlier than lunchtime and track readings over the weekend. Also asked patient not to take any extra doses of medication until she hears from our office as extra doses of medication could cause her BP to drop too low too fast and cause her symptoms. Patient verbalizes understanding.

## 2024-05-17 NOTE — Telephone Encounter (Signed)
 Pt c/o BP issue: STAT if pt c/o blurred vision, one-sided weakness or slurred speech.  STAT if BP is GREATER than 180/120 TODAY.  STAT if BP is LESS than 90/60 and SYMPTOMATIC TODAY  1. What is your BP concern? ^BP  2. Have you taken any BP medication today?yes  3. What are your last 5 BP readings?157/85 161/86 155/85 this morning-137/81 , 121/71  4. Are you having any other symptoms (ex. Dizziness, headache, blurred vision, passed out)? No. Pt took 5mg  of amlodipine  this morning and wants to discuss adjustment

## 2024-05-17 NOTE — Telephone Encounter (Signed)
 I agree that she may have small and transient increases in blood pressure in anticipation of medical procedures and during those infusions, Entyvio  itself can sometimes increase the blood pressure. Please check BP daily when fully relaxed and at rest for at least 10-15 minutes and send us  a log after about a week, before we make any decisions regarding changes in blood pressure medicine. Reassure her that that degree of elevation in blood pressure is not immediately dangerous and does not need to be treated urgently unless she has symptoms from it.

## 2024-05-19 ENCOUNTER — Other Ambulatory Visit: Payer: Self-pay | Admitting: Cardiovascular Disease

## 2024-05-21 ENCOUNTER — Telehealth: Payer: Self-pay | Admitting: Internal Medicine

## 2024-05-21 NOTE — Telephone Encounter (Signed)
 Please monitor to see if this persists or was self-limited - if still happening over the next 2 to 3 days she should call back and I will make recommendations

## 2024-05-21 NOTE — Telephone Encounter (Signed)
 Patient is advised.

## 2024-05-21 NOTE — Telephone Encounter (Signed)
 Inbound call from patient, states she is taking budesonide  but was having diarrhea over the weekend, she states she does not know how to explain it, but she states she would like to speak to a nurse to explain her symptoms and if the medication is working.

## 2024-05-21 NOTE — Telephone Encounter (Signed)
 Patient reports 2 days of frequent stools that would progress to diarrhea or loose stools. She states not watery but pieces. She notes no stools today at this time. She stopped fiberous foods when this started. No cramping or discomfort. No recent antibiotics.

## 2024-05-29 DIAGNOSIS — H5213 Myopia, bilateral: Secondary | ICD-10-CM | POA: Diagnosis not present

## 2024-05-29 DIAGNOSIS — H2512 Age-related nuclear cataract, left eye: Secondary | ICD-10-CM | POA: Diagnosis not present

## 2024-06-06 ENCOUNTER — Other Ambulatory Visit

## 2024-06-06 DIAGNOSIS — K50819 Crohn's disease of both small and large intestine with unspecified complications: Secondary | ICD-10-CM

## 2024-06-07 DIAGNOSIS — M25511 Pain in right shoulder: Secondary | ICD-10-CM | POA: Diagnosis not present

## 2024-06-07 DIAGNOSIS — M25512 Pain in left shoulder: Secondary | ICD-10-CM | POA: Diagnosis not present

## 2024-06-08 LAB — CALPROTECTIN, FECAL: Calprotectin, Fecal: 224 ug/g — ABNORMAL HIGH (ref 0–120)

## 2024-06-12 ENCOUNTER — Ambulatory Visit: Payer: Self-pay | Admitting: Internal Medicine

## 2024-06-13 DIAGNOSIS — N3020 Other chronic cystitis without hematuria: Secondary | ICD-10-CM | POA: Diagnosis not present

## 2024-06-13 DIAGNOSIS — R350 Frequency of micturition: Secondary | ICD-10-CM | POA: Diagnosis not present

## 2024-06-13 DIAGNOSIS — R8271 Bacteriuria: Secondary | ICD-10-CM | POA: Diagnosis not present

## 2024-06-22 ENCOUNTER — Other Ambulatory Visit: Payer: Self-pay

## 2024-06-22 MED ORDER — VALSARTAN 160 MG PO TABS: 160.0000 mg | ORAL_TABLET | Freq: Every day | ORAL | 0 refills | Status: DC

## 2024-06-25 DIAGNOSIS — K50019 Crohn's disease of small intestine with unspecified complications: Secondary | ICD-10-CM | POA: Diagnosis not present

## 2024-06-25 DIAGNOSIS — I10 Essential (primary) hypertension: Secondary | ICD-10-CM | POA: Diagnosis not present

## 2024-06-25 DIAGNOSIS — E782 Mixed hyperlipidemia: Secondary | ICD-10-CM | POA: Diagnosis not present

## 2024-06-26 LAB — LAB REPORT - SCANNED: EGFR: 95

## 2024-07-06 DIAGNOSIS — J029 Acute pharyngitis, unspecified: Secondary | ICD-10-CM | POA: Diagnosis not present

## 2024-07-06 DIAGNOSIS — K219 Gastro-esophageal reflux disease without esophagitis: Secondary | ICD-10-CM | POA: Diagnosis not present

## 2024-07-06 DIAGNOSIS — Z820 Family history of epilepsy and other diseases of the nervous system: Secondary | ICD-10-CM | POA: Diagnosis not present

## 2024-07-06 DIAGNOSIS — R251 Tremor, unspecified: Secondary | ICD-10-CM | POA: Diagnosis not present

## 2024-07-06 DIAGNOSIS — Z03818 Encounter for observation for suspected exposure to other biological agents ruled out: Secondary | ICD-10-CM | POA: Diagnosis not present

## 2024-07-09 ENCOUNTER — Ambulatory Visit

## 2024-07-09 VITALS — BP 106/61 | HR 57 | Temp 97.6°F | Resp 18 | Ht 63.0 in | Wt 173.6 lb

## 2024-07-09 DIAGNOSIS — K50818 Crohn's disease of both small and large intestine with other complication: Secondary | ICD-10-CM

## 2024-07-09 MED ORDER — VEDOLIZUMAB 300 MG IV SOLR
300.0000 mg | Freq: Once | INTRAVENOUS | Status: AC
  Administered 2024-07-09: 300 mg via INTRAVENOUS
  Filled 2024-07-09: qty 5

## 2024-07-09 NOTE — Progress Notes (Signed)
 Diagnosis: Crohn's Disease  Provider:  Praveen Mannam MD  Procedure: IV Infusion  IV Type: Peripheral, IV Location: L Antecubital  Entyvio  (Vedolizumab ), Dose: 300 mg  Infusion Start Time: 1038  Infusion Stop Time: 1113  Post Infusion IV Care: Peripheral IV Discontinued  Discharge: Condition: Good, Destination: Home . AVS Provided  Performed by:  Leita FORBES Miles, LPN

## 2024-07-10 ENCOUNTER — Ambulatory Visit (INDEPENDENT_AMBULATORY_CARE_PROVIDER_SITE_OTHER): Admitting: Internal Medicine

## 2024-07-10 ENCOUNTER — Encounter: Payer: Self-pay | Admitting: Internal Medicine

## 2024-07-10 VITALS — BP 118/64 | HR 65 | Ht 63.0 in | Wt 174.0 lb

## 2024-07-10 DIAGNOSIS — Z7960 Long term (current) use of unspecified immunomodulators and immunosuppressants: Secondary | ICD-10-CM

## 2024-07-10 DIAGNOSIS — D500 Iron deficiency anemia secondary to blood loss (chronic): Secondary | ICD-10-CM | POA: Diagnosis not present

## 2024-07-10 DIAGNOSIS — K50819 Crohn's disease of both small and large intestine with unspecified complications: Secondary | ICD-10-CM | POA: Diagnosis not present

## 2024-07-10 MED ORDER — VITAMIN B-12 1000 MCG PO TABS: 1000.0000 ug | ORAL_TABLET | Freq: Every day | ORAL | Status: AC

## 2024-07-10 NOTE — Patient Instructions (Addendum)
 Dr Avram is going to work on getting your Entyvio  approved for every 6 weeks.  Purchase over the counter Ferrous Sulfate 325 mg and take one daily. Iron can cause your stool to look dark.   _______________________________________________________  If your blood pressure at your visit was 140/90 or greater, please contact your primary care physician to follow up on this.  _______________________________________________________  If you are age 80 or older, your body mass index should be between 23-30. Your Body mass index is 30.82 kg/m. If this is out of the aforementioned range listed, please consider follow up with your Primary Care Provider.  If you are age 25 or younger, your body mass index should be between 19-25. Your Body mass index is 30.82 kg/m. If this is out of the aformentioned range listed, please consider follow up with your Primary Care Provider.   ________________________________________________________  The [facility] providers would like to encourage you to use MYCHART to communicate with providers for non-urgent requests or questions.  Due to long hold times on the telephone, sending your provider a message by [facility] may be a faster and more efficient way to get a response.  Please allow 48 business hours for a response.  Please remember that this is for non-urgent requests.  _______________________________________________________  [facility] is using a team-based approach to care.  Your team is made up of your doctor and two to three APPS. Our APPS (Nurse Practitioners and Physician Assistants) work with your physician to ensure care continuity for you. They are fully qualified to address your health concerns and develop a treatment plan. They communicate directly with your gastroenterologist to care for you. Seeing the Advanced Practice Practitioners on your physician's team can help you by facilitating care more promptly, often allowing for earlier  appointments, access to diagnostic testing, procedures, and other specialty referrals.    I appreciate the opportunity to care for you. Lupita Avram, MD, [facility]

## 2024-07-10 NOTE — Progress Notes (Signed)
 Jade Hernandez 83 y.o. [birth year] [id number]  Assessment & Plan:   Encounter Diagnoses  Name Primary?   Crohn's disease of small and large intestines with complication (HCC) Yes   Long-term use of immunosuppressant medication    Iron deficiency anemia due to chronic blood loss      Elevated fecal calprotectin indicates persistent inflammation in the small intestine despite budesonide  and Entyvio . Entyvio  trough level slightly below target. Disease not controlled but not severely exacerbated. Stress may exacerbate condition (widowed earlier this year) - Prescribe Entyvio  every six weeks, pending insurance approval. - Monitor response to adjusted Entyvio  dosing and arrange follow-up timing once I know new Entyvio  schedule. -Consider follow-up colonoscopy at some point.  We discussed this by phone after the visit she is not inclined to pursue that.  Iron deficiency Iron deficiency with low levels, not treated. Low dietary meat intake noted. - Recommend over-the-counter iron supplementation.  Gastroesophageal reflux symptoms Intermittent symptoms suggestive of gastroesophageal reflux, relieved by Tums. Not related to Crohn's disease. - Continue use of Tums as needed for symptom relief.   B12 level was normal but in the lower normal range.  I called her and recommended she take 1000 mcg vitamin B12 by mouth daily.  With her Crohn's disease she is at risk for deficiency in the future.     CC: Jade Kins, MD    Subjective:  Gastroenterology summary:   Crohn's ileocolitis Colonoscopy 01/2019 acute changes not chronic-ileitis and nonspecific colitis changes on random biopsies - Tx x 2 mos budesonide  9 mg w/ success Recurrent problems, diagnosed with Crohn's disease required prednisone  over budesonide  in 2021 -CT abdomen pelvis with contrast 07/22/2020 mild to moderate enterocolitis with diffuse involvement of the distal ileum and colon -Entyvio  begun 03/04/2021 and has done  well on every 8 weeks 300 mg -May 2025 some diarrhea reported fecal calprotectin 275, trough vedolizumab  13 with no antibodies 2 months 9 mg budesonide  prescribed   Associated issues include  iron deficiency anemia   Gallstone pancreatitis August 2024-admitted twice, originally no signs of stones though had dilated bile duct, returned within days and MRCP showed bile duct stones, status post ERCP and stone extraction by Dr. Wilhelmenia.  Periampullary diverticulum.   ----------------------------------------------------------------------------------------------------------  Chief Complaint: Follow-up of Crohn's disease on Entyvio   HPI Discussed the use of AI scribe software for clinical note transcription with the patient, who gave verbal consent to proceed.   Jade Hernandez is an 83 year old female with Crohn's disease on Entyvio  every 8 weeks here for follow-up, [name] is present.  Earlier this year she called in complaining of diarrhea at times. fecal calprotectin level was 275 04/03/24. A 2 month trial of budesonide  9 mg was taken. Repeat calprotectin was 224 on 06/06/24.  She experiences bowel irregularities, including variable stool consistency, with stools sometimes soft, sometimes hard, and varying in size from skinny to wide. Her most recent Entyvio  trough level was 13 but no antibodies in May 2025.   She experiences occasional left upper quadrant pain, described as a slow, achy sensation that does not last long. No sharp or crampy pain is noted, and she does not associate it with gas. She has not experienced any bleeding.  Recent lab work showed normal hemoglobin and chemistries, but low iron levels. She does not consume much meat, which may contribute to her low iron levels.  She experiences symptoms suggestive of acid reflux, including a sensation of heaviness  which improves with Tums. No  persistent sore throat or heartburn. No dysphagia.   She has been treated for a  UTI in the interim since her last visit here in March.  Labs at primary care 06/25/2024 CBC with hemoglobin 12.5 normal white count normal platelet count MCV 77.8.  Iron panel with TIBC high at 487, iron 38 saturation 8% B12 234 CMET normal      Allergies  Allergen Reactions   Feraheme  [Ferumoxytol ] Shortness Of Breath and Palpitations    Severe pain all over   Current Meds  Medication Sig   acetaminophen  (TYLENOL ) 500 MG tablet Take 1,000 mg by mouth in the morning and at bedtime.   amLODipine  (NORVASC ) 2.5 MG tablet TAKE 1 TABLET(2.5 MG) BY MOUTH DAILY   cholecalciferol  (VITAMIN D3) 25 MCG (1000 UNIT) tablet Take 1,000 Units by mouth daily.   metoprolol  succinate (TOPROL -XL) 100 MG 24 hr tablet TAKE 1 TABLET(100 MG) BY MOUTH DAILY WITH OR IMMEDIATELY FOLLOWING A MEAL   Probiotic Product (PROBIOTIC DAILY PO) Take 1 capsule by mouth in the morning.   valsartan  (DIOVAN ) 160 MG tablet Take 1 tablet (160 mg total) by mouth daily.   vedolizumab  (ENTYVIO ) 300 MG injection Inject 300 mg into the vein every 2 (two) months.   Past Medical History:  Diagnosis Date   Acute gallstone pancreatitis 06/23/2023   Adenomatous colon polyp    Arthritis    Choledocholithiasis 06/25/2023   Crohn's disease of both small and large intestine with other complication (HCC) 10/23/2020   Diverticulosis    High cholesterol    Hypertension    IBS (irritable bowel syndrome)    Palpitations    Pre-diabetes    UTI (urinary tract infection)    Past Surgical History:  Procedure Laterality Date   BILATERAL CARPAL TUNNEL RELEASE     COLONOSCOPY     ERCP N/A 06/25/2023   Procedure: ENDOSCOPIC RETROGRADE CHOLANGIOPANCREATOGRAPHY (ERCP);  Surgeon: Wilhelmenia Aloha Raddle., MD;  Location: [facility] ENDOSCOPY;  Service: Gastroenterology;  Laterality: N/A;   NM MYOCAR PERF WALL MOTION  10/02/2007   No significant ischemia demonstrated   REMOVAL OF STONES  06/25/2023   Procedure: REMOVAL OF STONES;  Surgeon: Wilhelmenia  Aloha Raddle., MD;  Location: [facility] ENDOSCOPY;  Service: Gastroenterology;;   ANNETT  06/25/2023   Procedure: ANNETT;  Surgeon: Wilhelmenia Aloha Raddle., MD;  Location: [facility] ENDOSCOPY;  Service: Gastroenterology;;   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   Review of Systems See HPI  Objective:   Physical Exam @BP  118/64   Pulse 65   Ht 5' 3 (1.6 m)   Wt 174 lb (78.9 kg)   BMI 30.82  kg/m @  General:  NAD Eyes:   anicteric Lungs:  clear Heart::  S1S2 no rubs, murmurs or gallops Abdomen:  soft and nontender, BS+ Ext:   no edema, cyanosis or clubbing    Data Reviewed:  See HPI

## 2024-07-11 ENCOUNTER — Other Ambulatory Visit: Payer: Self-pay | Admitting: Internal Medicine

## 2024-07-11 ENCOUNTER — Telehealth: Payer: Self-pay | Admitting: Internal Medicine

## 2024-07-11 DIAGNOSIS — K50819 Crohn's disease of both small and large intestine with unspecified complications: Secondary | ICD-10-CM

## 2024-07-11 NOTE — Telephone Encounter (Signed)
 This patient is on Entyvio  300 mg every 8 weeks.  She is having some diarrhea and change in bowel habits and has elevated fecal calprotectin.  Her trough level of Entyvio  was 13 which is below expert recommendations to maintain remission on regular infusions.  I would like to change her to 300 mg Entyvio  every 6 weeks.  Please help me with how to do this, I know we will have to ask for a prior authorization.  Thanks

## 2024-07-12 NOTE — Telephone Encounter (Signed)
 Thank you  PJ please let the patient know also  Have her do a fecal calprotectin in 4 months, please

## 2024-07-12 NOTE — Telephone Encounter (Signed)
 I spoke with Jade Hernandez and informed her that her infusions will be every 6 weeks. They will reach out to her to set up. And she knows to come and get the stool kit and do the stool study in December. Order entered into epic.

## 2024-07-12 NOTE — Telephone Encounter (Signed)
 Dr. Avram,  Patient last dose was 07/09/24.  She will be scheduled 6wks from that date.  Auth Submission: NO AUTH NEEDED Site of care: Site of care: [facility] Payer: MEDICARE A/B & BCBS SUPP Medication & CPT/J Code(s) submitted: Entyvio  (Vedolizumab ) J3380 Diagnosis Code:  Route of submission (phone, fax, portal):  Phone # Fax # Auth type: Buy/Bill PB Units/visits requested: 300MG  [id number] Reference number:  Approval from: 07/12/24 to 11/14/24

## 2024-07-31 ENCOUNTER — Other Ambulatory Visit: Payer: Self-pay | Admitting: Cardiovascular Disease

## 2024-08-02 DIAGNOSIS — H25812 Combined forms of age-related cataract, left eye: Secondary | ICD-10-CM | POA: Diagnosis not present

## 2024-08-02 DIAGNOSIS — H2181 Floppy iris syndrome: Secondary | ICD-10-CM | POA: Diagnosis not present

## 2024-08-02 DIAGNOSIS — H2512 Age-related nuclear cataract, left eye: Secondary | ICD-10-CM | POA: Diagnosis not present

## 2024-08-14 ENCOUNTER — Encounter: Payer: Self-pay | Admitting: Cardiovascular Disease

## 2024-08-14 ENCOUNTER — Ambulatory Visit: Attending: Cardiovascular Disease | Admitting: Cardiovascular Disease

## 2024-08-14 VITALS — BP 140/70 | HR 73 | Ht 63.0 in | Wt 177.0 lb

## 2024-08-14 DIAGNOSIS — I700 Atherosclerosis of aorta: Secondary | ICD-10-CM

## 2024-08-14 DIAGNOSIS — R600 Localized edema: Secondary | ICD-10-CM

## 2024-08-14 DIAGNOSIS — I10 Essential (primary) hypertension: Secondary | ICD-10-CM | POA: Insufficient documentation

## 2024-08-14 DIAGNOSIS — E782 Mixed hyperlipidemia: Secondary | ICD-10-CM | POA: Insufficient documentation

## 2024-08-14 DIAGNOSIS — K50818 Crohn's disease of both small and large intestine with other complication: Secondary | ICD-10-CM | POA: Diagnosis not present

## 2024-08-14 NOTE — Patient Instructions (Signed)

## 2024-08-21 ENCOUNTER — Other Ambulatory Visit: Payer: Self-pay | Admitting: Nurse Practitioner

## 2024-08-21 ENCOUNTER — Other Ambulatory Visit: Payer: Self-pay | Admitting: Cardiovascular Disease

## 2024-08-21 ENCOUNTER — Ambulatory Visit (INDEPENDENT_AMBULATORY_CARE_PROVIDER_SITE_OTHER)

## 2024-08-21 VITALS — BP 121/74 | HR 65 | Temp 97.2°F | Resp 18 | Ht 63.0 in | Wt 179.0 lb

## 2024-08-21 DIAGNOSIS — K50818 Crohn's disease of both small and large intestine with other complication: Secondary | ICD-10-CM | POA: Diagnosis not present

## 2024-08-21 MED ORDER — VEDOLIZUMAB 300 MG IV SOLR
300.0000 mg | Freq: Once | INTRAVENOUS | Status: AC
  Administered 2024-08-21: 300 mg via INTRAVENOUS
  Filled 2024-08-21: qty 5

## 2024-08-21 NOTE — Progress Notes (Signed)
 Diagnosis: Crohn's Disease  Provider:  Praveen Mannam MD  Procedure: IV Infusion  IV Type: Peripheral, IV Location: L Antecubital  Entyvio  (Vedolizumab ), Dose: 300 mg  Infusion Start Time: 1045  Infusion Stop Time: 1121  Post Infusion IV Care: Peripheral IV Discontinued  Discharge: Condition: Good, Destination: Home . AVS Provided  Performed by:  Leita FORBES Miles, LPN

## 2024-08-21 NOTE — Progress Notes (Signed)
 Cardiology Office Note   Date:  08/21/2024  ID:  Jade Hernandez, Jade Hernandez 04-02-1941, MRN [id number] PCP: [name], MD   [facility] Providers Cardiologist:  Debby Sor, MD (Inactive)     History of Present Illness Jade Hernandez is a 83 y.o. female with a history of hypertension, Crohn's disease and chronic lower extremity edema, incidentally discovered aortic atherosclerosis on imaging studies, who is here to transition care from Dr. Charlena Sor after his retirement.  I took care of her husband, Tery for many years until he unfortunately passed away from pancreatic cancer earlier this year.  I have also seen one of her sons as a patient in our office.  She is accompanied today by her daughter, Dagoberto.  Overall she has been doing well except she has had some spikes in blood pressure when she receives Entyvio  infusions for Crohn's disease.  She has chronic lower extremity edema that is always a little worse in the right lower extremity (she has a history of right knee replacement in April 2024).  She does not think the swelling in her legs is any worse than usual.  She has not had problems with orthopnea or PND and has never had complaints of chest pain at rest or with activity.  She denies dizziness, palpitations or syncope.  ROS: As described above  Studies Reviewed EKG Interpretation Date/Time:  Tuesday August 14 2024 15:23:02 EDT Ventricular Rate:  73 PR Interval:  174 QRS Duration:  82 QT Interval:  400 QTC Calculation: 440 R Axis:   -18  Text Interpretation: Normal sinus rhythm Minimal voltage criteria for LVH, may be normal variant ( R in aVL ) When compared with ECG of 18-Jun-2023 11:55, Nonspecific T wave abnormality no longer evident in Lateral leads Confirmed by Lilinoe Acklin ([id number]) on 08/14/2024 3:44:39 PM     Risk Assessment/Calculations           Physical Exam VS:  BP (!) 140/70   Pulse 73   Ht 5' 3 (1.6 m)   Wt 177 lb (80.3 kg)   SpO2 97%   BMI  31.35 kg/m        Wt Readings from Last 3 Encounters:  08/21/24 179 lb (81.2 kg)  08/14/24 177 lb (80.3 kg)  07/10/24 174 lb (78.9 kg)    GEN: Well nourished, well developed in no acute distress NECK: No JVD; No carotid bruits CARDIAC: RRR, no murmurs, rubs, gallops RESPIRATORY:  Clear to auscultation without rales, wheezing or rhonchi  ABDOMEN: Soft, non-tender, non-distended EXTREMITIES: She has soft nonpitting edema of the feet and ankles bilaterally, 2+ but slightly more obvious on the right.  ASSESSMENT AND PLAN HTN: Adequately controlled on combination of valsartan , metoprolol  and amlodipine .  Borderline systolic blood pressure today, but just a few weeks ago her blood pressure was 118/64.  No changes were made today. 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  Try to keep the legs elevated on the recliner will wear compression stockings. Crohn's disease/history of iron deficiency anemia: Currently appears to be well compensated on Entyvio .  She occasionally has mildly elevated blood pressure during Entyvio  infusions, but it is never severe. Aortic atherosclerosis: Incidentally described on imaging studies (CT abdomen and pelvis), but without associated aneurysm.  Probably average for an 83 year old on my review.  Does not have clinically significant CAD or PAD.  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  I do not think lipid-lowering therapy is necessary.       Dispo: Follow-up in 1 year  Signed, Jerel Balding, MD

## 2024-09-04 ENCOUNTER — Ambulatory Visit

## 2024-09-04 DIAGNOSIS — Z23 Encounter for immunization: Secondary | ICD-10-CM | POA: Diagnosis not present

## 2024-09-13 DIAGNOSIS — N958 Other specified menopausal and perimenopausal disorders: Secondary | ICD-10-CM | POA: Diagnosis not present

## 2024-09-13 DIAGNOSIS — N3020 Other chronic cystitis without hematuria: Secondary | ICD-10-CM | POA: Diagnosis not present

## 2024-10-02 ENCOUNTER — Ambulatory Visit (INDEPENDENT_AMBULATORY_CARE_PROVIDER_SITE_OTHER)

## 2024-10-02 VITALS — BP 137/78 | HR 61 | Temp 97.9°F | Resp 16 | Ht 63.0 in | Wt 177.4 lb

## 2024-10-02 DIAGNOSIS — K50818 Crohn's disease of both small and large intestine with other complication: Secondary | ICD-10-CM | POA: Diagnosis not present

## 2024-10-02 MED ORDER — VEDOLIZUMAB 300 MG IV SOLR
300.0000 mg | Freq: Once | INTRAVENOUS | Status: AC
  Administered 2024-10-02: 300 mg via INTRAVENOUS
  Filled 2024-10-02: qty 5

## 2024-10-02 NOTE — Progress Notes (Signed)
 Diagnosis: Crohn's Disease  Provider:  Praveen Mannam MD  Procedure: IV Infusion  IV Type: Peripheral, IV Location: L Antecubital  Entyvio  (Vedolizumab ), Dose: 300 mg  Infusion Start Time: 1103  Infusion Stop Time: 1137  Post Infusion IV Care: Peripheral IV Discontinued  Discharge: Condition: Good, Destination: Home . AVS Provided  Performed by:  Leita FORBES Miles, LPN

## 2024-10-04 ENCOUNTER — Other Ambulatory Visit: Payer: Self-pay | Admitting: Cardiovascular Disease

## 2024-10-04 ENCOUNTER — Other Ambulatory Visit: Payer: Self-pay | Admitting: Student

## 2024-10-16 ENCOUNTER — Other Ambulatory Visit

## 2024-10-16 DIAGNOSIS — K50819 Crohn's disease of both small and large intestine with unspecified complications: Secondary | ICD-10-CM

## 2024-10-18 LAB — CALPROTECTIN, FECAL: Calprotectin, Fecal: 578 ug/g — ABNORMAL HIGH (ref 0–120)

## 2024-10-19 ENCOUNTER — Ambulatory Visit: Payer: Self-pay | Admitting: Internal Medicine

## 2024-10-19 DIAGNOSIS — K50819 Crohn's disease of both small and large intestine with unspecified complications: Secondary | ICD-10-CM

## 2024-10-19 DIAGNOSIS — Z7960 Long term (current) use of unspecified immunomodulators and immunosuppressants: Secondary | ICD-10-CM

## 2024-10-19 MED ORDER — BUDESONIDE 3 MG PO CPEP: 9.0000 mg | ORAL_CAPSULE | Freq: Every day | ORAL | 1 refills | Status: DC

## 2024-10-19 NOTE — Telephone Encounter (Signed)
 [name],  Please contact her and make an appointment for mid January 2 or 3 week it is okay to use a banding spot or a nurse visit spot if necessary

## 2024-10-19 NOTE — Telephone Encounter (Signed)
 Reviewed rising calprotectin level. Patient is having more diarrhea  Plan:  Orders Placed This Encounter  Procedures   Vedolizumab  and Anti-Vedo Ab    Standing Status:   Future    Expected Date:   11/12/2024    Expiration Date:   11/19/2024   Meds ordered this encounter  Medications   budesonide  (ENTOCORT EC ) 3 MG 24 hr capsule    Sig: Take 3 capsules (9 mg total) by mouth daily.    Dispense:  90 capsule    Refill:  1

## 2024-10-27 DIAGNOSIS — M25531 Pain in right wrist: Secondary | ICD-10-CM | POA: Diagnosis not present

## 2024-11-12 ENCOUNTER — Other Ambulatory Visit (INDEPENDENT_AMBULATORY_CARE_PROVIDER_SITE_OTHER)

## 2024-11-12 DIAGNOSIS — Z7960 Long term (current) use of unspecified immunomodulators and immunosuppressants: Secondary | ICD-10-CM

## 2024-11-12 DIAGNOSIS — K50819 Crohn's disease of both small and large intestine with unspecified complications: Secondary | ICD-10-CM

## 2024-11-13 ENCOUNTER — Ambulatory Visit (INDEPENDENT_AMBULATORY_CARE_PROVIDER_SITE_OTHER)

## 2024-11-13 VITALS — BP 141/77 | HR 60 | Temp 97.6°F | Resp 16 | Ht 63.0 in | Wt 177.6 lb

## 2024-11-13 DIAGNOSIS — K50818 Crohn's disease of both small and large intestine with other complication: Secondary | ICD-10-CM | POA: Diagnosis not present

## 2024-11-13 MED ORDER — VEDOLIZUMAB 300 MG IV SOLR
300.0000 mg | Freq: Once | INTRAVENOUS | Status: AC
  Administered 2024-11-13: 300 mg via INTRAVENOUS
  Filled 2024-11-13: qty 5

## 2024-11-13 NOTE — Progress Notes (Signed)
 Diagnosis: Crohn's Disease  Provider:  Praveen Mannam MD  Procedure: IV Infusion  IV Type: Peripheral, IV Location: L Antecubital   Entyvio  (Vedolizumab ), Dose: 300 mg  Infusion Start Time: 1112  Infusion Stop Time: 1147  Post Infusion IV Care: Peripheral IV Discontinued  Discharge: Condition: Good, Destination: Home . AVS Provided  Performed by:  Leita FORBES Miles, LPN

## 2024-11-22 ENCOUNTER — Telehealth: Payer: Self-pay

## 2024-11-22 LAB — VEDOLIZUMAB AND ANTI-VEDO AB
Anti-Vedolizumab Antibody: <25 ng/mL
Vedolizumab: 22 ug/mL

## 2024-11-22 LAB — SERIAL MONITORING

## 2024-11-22 NOTE — Telephone Encounter (Signed)
 Auth Submission: NO AUTH NEEDED Site of care: Site of care: [facility] Payer: Medicare A/B with BCBS supplement Medication & CPT/J Code(s) submitted: Entyvio  (Vedolizumab ) J3380 Diagnosis Code:  Route of submission (phone, fax, portal):  Phone # Fax # Auth type: Buy/Bill PB Units/visits requested: 300mg  x 9 doses Reference number:  Approval from: 11/22/24 to 12/15/25

## 2024-12-04 ENCOUNTER — Ambulatory Visit: Admitting: Internal Medicine

## 2024-12-04 ENCOUNTER — Other Ambulatory Visit (INDEPENDENT_AMBULATORY_CARE_PROVIDER_SITE_OTHER)

## 2024-12-04 VITALS — BP 110/68 | HR 68 | Ht 63.0 in | Wt 179.6 lb

## 2024-12-04 DIAGNOSIS — Z7960 Long term (current) use of unspecified immunomodulators and immunosuppressants: Secondary | ICD-10-CM

## 2024-12-04 DIAGNOSIS — K50819 Crohn's disease of both small and large intestine with unspecified complications: Secondary | ICD-10-CM | POA: Diagnosis not present

## 2024-12-04 DIAGNOSIS — D500 Iron deficiency anemia secondary to blood loss (chronic): Secondary | ICD-10-CM

## 2024-12-04 LAB — CBC WITH DIFFERENTIAL/PLATELET
Basophils Absolute: 0 K/uL (ref 0.0–0.1)
Basophils Relative: 0.6 % (ref 0.0–3.0)
Eosinophils Absolute: 0 K/uL (ref 0.0–0.7)
Eosinophils Relative: 0.3 % (ref 0.0–5.0)
HCT: 40.6 % (ref 36.0–46.0)
Hemoglobin: 13.6 g/dL (ref 12.0–15.0)
Lymphocytes Relative: 14.6 % (ref 12.0–46.0)
Lymphs Abs: 0.8 K/uL (ref 0.7–4.0)
MCHC: 33.5 g/dL (ref 30.0–36.0)
MCV: 83.7 fl (ref 78.0–100.0)
Monocytes Absolute: 0.5 K/uL (ref 0.1–1.0)
Monocytes Relative: 8.4 % (ref 3.0–12.0)
Neutro Abs: 4.2 K/uL (ref 1.4–7.7)
Neutrophils Relative %: 76.1 % (ref 43.0–77.0)
Platelets: 183 K/uL (ref 150.0–400.0)
RBC: 4.85 Mil/uL (ref 3.87–5.11)
RDW: 15.8 % — ABNORMAL HIGH (ref 11.5–15.5)
WBC: 5.6 K/uL (ref 4.0–10.5)

## 2024-12-04 LAB — COMPREHENSIVE METABOLIC PANEL WITH GFR
ALT: 10 U/L (ref 3–35)
AST: 14 U/L (ref 5–37)
Albumin: 3.7 g/dL (ref 3.5–5.2)
Alkaline Phosphatase: 61 U/L (ref 39–117)
BUN: 22 mg/dL (ref 6–23)
CO2: 28 meq/L (ref 19–32)
Calcium: 9.4 mg/dL (ref 8.4–10.5)
Chloride: 107 meq/L (ref 96–112)
Creatinine, Ser: 0.55 mg/dL (ref 0.40–1.20)
GFR: 85.73 mL/min
Glucose, Bld: 88 mg/dL (ref 70–99)
Potassium: 3.8 meq/L (ref 3.5–5.1)
Sodium: 140 meq/L (ref 135–145)
Total Bilirubin: 0.8 mg/dL (ref 0.2–1.2)
Total Protein: 6.4 g/dL (ref 6.0–8.3)

## 2024-12-04 LAB — C-REACTIVE PROTEIN: CRP: <0.5 mg/dL — ABNORMAL LOW (ref 1.0–20.0)

## 2024-12-04 LAB — FERRITIN: Ferritin: 37.3 ng/mL (ref 10.0–291.0)

## 2024-12-04 MED ORDER — BUDESONIDE 3 MG PO CPEP: 9.0000 mg | ORAL_CAPSULE | Freq: Every day | ORAL | 1 refills | Status: AC

## 2024-12-04 NOTE — Patient Instructions (Addendum)
" °  VISIT SUMMARY: Today we discussed the management of your ulcerative colitis and your recent wrist fracture. We also reviewed your immunization history and living situation.  YOUR PLAN: ULCERATIVE COLITIS: Your ulcerative colitis is currently being managed with vedolizumab  infusions and budesonide . However, your symptoms and inflammatory markers suggest that this treatment is not fully effective. -Continue taking budesonide  as prescribed. A prescription refill has been provided. -Do not schedule any further vedolizumab  infusions. Maintain your next dose until we finalize the transition to a new therapy. -We are considering transitioning you to Rinvoq. We will discuss this further and initiate it after you complete the shingles vaccination series. -Receive the second dose of the shingles vaccine before starting Rinvoq. Coordinate this with your primary care provider. -Blood work has been ordered, including repeat tuberculosis testing and other required labs for monitoring and pre-initiation of the new therapy. -Follow up after your lab results are available and after you discuss the shingles vaccination with your primary care provider. -We have deferred the colonoscopy as per your preference. It will only be necessary if your clinical status changes.  WRIST FRACTURE: You recently had a fall at home that resulted in a wrist fracture, which is being managed with a splint. -Continue to monitor your wrist for improvement. Follow up with your healthcare provider if you experience any issues.  IMMUNIZATIONS: You have received the first dose of the shingles vaccine but did not complete the series. I recommend that you receive the second dose of the shingles vaccine before starting Rinvoq. Please discuss with Dr. Loreli.  Your provider has requested that you go to the basement level for lab work before leaving today. Press B on the elevator. The lab is located at the first door on the left as you exit the  elevator.  Due to recent changes in healthcare laws, you may see the results of your imaging and laboratory studies on MyChart before your provider has had a chance to review them.  We understand that in some cases there may be results that are confusing or concerning to you. Not all laboratory results come back in the same time frame and the provider may be waiting for multiple results in order to interpret others.  Please give us  48 hours in order for your provider to thoroughly review all the results before contacting the office for clarification of your results.    I appreciate the opportunity to care for you. Lupita Commander, MD, [facility]   Contains text generated by [employer]bridge.   "

## 2024-12-04 NOTE — Progress Notes (Signed)
 [address]       Jade Hernandez 84 y.o. Apr 16, 1941 [id number]  Assessment & Plan:   Encounter Diagnoses  Name Primary?   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 Current vedolizumab  therapy inadequate, considering transition to Rinvoq. Increased herpes zoster risk with Rinvoq necessitates shingles vaccination. - Continued budesonide ; prescription refill provided. - Plan to discontinue vedolizumab ; advised not to schedule further infusions, maintain next dose until transition finalized. - Discussed transition to Rinvoq as next-line therapy, initiate after further discussion and shingles vaccination completion. - Recommended she receive second dose of shingles vaccine prior to starting Rinvoq; advised to coordinate with primary care provider. - Ordered blood work including repeat tuberculosis testing and other required labs for monitoring and pre-initiation of new therapy. - To follow up after lab results and after she discusses shingles vaccination with primary care provider. - Deferred colonoscopy per her preference; agreed not necessary unless clinical status changes.  Orders Placed This Encounter  Procedures   CBC with Differential/Platelet   Comprehensive metabolic panel with GFR   C-reactive protein   Ferritin   Hepatitis B surface antigen   Hepatitis B core antibody, total   QuantiFERON-TB Gold Plus   Sedimentation rate     I appreciate the opportunity to care for this patient.   CC: Jade Kins, MD   Subjective:  Gastroenterology summary:   Crohn's ileocolitis Colonoscopy 01/2019 acute changes not chronic-ileitis and nonspecific colitis changes on random biopsies - Tx x 2 mos budesonide  9 mg w/ success Recurrent problems, diagnosed with Crohn's disease  required prednisone  over budesonide  in 2021 -CT abdomen pelvis with contrast 07/22/2020 mild to moderate enterocolitis with diffuse involvement of the distal ileum and colon -Entyvio  begun 03/04/2021 and has done well on every 8 weeks 300 mg -May 2025 some diarrhea reported fecal calprotectin 275, trough vedolizumab  13 with no antibodies 2 months 9 mg budesonide  prescribed - July/August 2025 calprotectin 224, decided to go to every 6-week Entyvio .  Then required budesonide  for 2 months - December 2025 diarrhea and calprotectin 578   Associated issues include  iron deficiency anemia   Gallstone pancreatitis August 2024-admitted twice, originally no signs of stones though had dilated bile duct, returned within days and MRCP showed bile duct stones, status post ERCP and stone extraction by Dr. Wilhelmenia.  Periampullary diverticulum.  -----------------------------------------------------------------------------------  Chief Complaint: Crohn's ileocolitis  HPI Discussed the use of AI scribe software for clinical note transcription with the patient, who gave verbal consent to proceed.   Jade Hernandez is an 84 year old female with ulcerative colitis who presents for follow-up of disease management and medication regimen.  Daughter Jade Hernandez is present and participates and provides details of the history patient unable to provide at times.  Ulcerative Colitis Disease Activity and Management: - Ulcerative colitis managed with vedolizumab  infusions; dosing interval shortened to every six weeks in August 2025 due to persistent symptoms - In December 2025, elevated stool calprotectin and increased diarrhea prompted reintroduction of budesonide ; currently taking second bottle - Diarrhea minimal, with only one episode this morning - Bowel movements generally good - Appetite increased since starting budesonide   Constitutional and Neurologic Symptoms: - Occasional dizziness when standing  Recent Trauma  and Musculoskeletal Symptoms: - Recent fall at home resulted in wrist fracture - Wrist fracture managed  with splint and showing improvement  Immunization History: - Received first dose of shingles vaccine one to two years ago; did not complete series - Mild case of shingles occurred many years ago   Functional Status and Living Situation: - Lives alone in a large house after husband's death approximately 1 year ago in February - Considering moving to a smaller residence - Jade Hernandez support from family, including a son who lives in [place]      Jade Hernandez Readings from Last 3 Encounters:  12/04/24 179 lb 9.6 oz (81.5 kg)  11/13/24 177 lb 9.6 oz (80.6 kg)  10/02/24 177 lb 6.4 oz (80.5 kg)     Allergies[1] Active Medications[2] Past Medical History:  Diagnosis Date   Acute gallstone pancreatitis 06/23/2023   Adenomatous colon polyp    Arthritis    Choledocholithiasis 06/25/2023   Crohn's disease of both small and large intestine with other complication (HCC) 10/23/2020   Diverticulosis    High cholesterol    Hypertension    IBS (irritable bowel syndrome)    Palpitations    Pre-diabetes    UTI (urinary tract infection)    Past Surgical History:  Procedure Laterality Date   BILATERAL CARPAL TUNNEL RELEASE     COLONOSCOPY     ERCP N/A 06/25/2023   Procedure: ENDOSCOPIC RETROGRADE CHOLANGIOPANCREATOGRAPHY (ERCP);  Surgeon: Jade Hernandez., MD;  [facility] ENDOSCOPY;  Service: Gastroenterology;  Laterality: N/A;   NM MYOCAR PERF WALL MOTION  10/02/2007   No significant ischemia demonstrated   REMOVAL OF STONES  06/25/2023   Procedure: REMOVAL OF STONES;  Surgeon: Jade Hernandez., MD;  Location: [facility] ENDOSCOPY;  Service: Gastroenterology;;   Jade Hernandez  06/25/2023   Procedure: Jade Hernandez;  Surgeon: Jade Hernandez., MD;  Location: [facility] ENDOSCOPY;  Service: Gastroenterology;;   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   Review of Systems As per HPI  Objective:   Physical Exam BP 110/68   Pulse 68   Ht 5' 3 (1.6 m)   Wt 179 lb 9.6 oz (81.5 kg)   BMI 31.81 kg/m  Well-developed no acute distress Lungs clear Heart sounds normal S1-S2 no rubs or gallops Abdomen is soft nontender bowel sounds present no masses     [1]  Allergies Allergen Reactions   Feraheme  [Ferumoxytol ] Shortness Of Breath and Palpitations    Severe pain all over  [2]  Current Meds  Medication Sig   acetaminophen  (TYLENOL ) 500 MG tablet Take 1,000 mg by mouth in the morning and at bedtime.   amLODipine  (NORVASC ) 2.5 MG tablet TAKE 1 TABLET(2.5 MG) BY MOUTH DAILY   budesonide  (ENTOCORT  EC) 3 MG 24 hr capsule Take 3 capsules (9 mg total) by mouth daily.   calcium carbonate (SUPER CALCIUM) 1500 (600 Ca) MG TABS tablet 1 tablet Orally once a day   cholecalciferol  (VITAMIN D3) 25 MCG (1000 UNIT) tablet Take 1,000 Units by mouth daily.   cyanocobalamin  (VITAMIN B12) 1000 MCG tablet Take 1 tablet (1,000 mcg total) by mouth daily.   Ferrous Sulfate (IRON PO) Take by mouth.   metoprolol  succinate (TOPROL -XL)  100 MG 24 hr tablet TAKE 1 TABLET(100 MG) BY MOUTH DAILY WITH OR IMMEDIATELY FOLLOWING A MEAL   Probiotic Product (PROBIOTIC DAILY PO) Take 1 capsule by mouth in the morning.   valsartan  (DIOVAN ) 160 MG tablet Take 1 tablet (160 mg total) by mouth daily.   vedolizumab  (ENTYVIO ) 300 MG injection Inject 300 mg into the vein every 6 (six) weeks.   "

## 2024-12-06 ENCOUNTER — Encounter: Payer: Self-pay | Admitting: Internal Medicine

## 2024-12-06 LAB — HEPATITIS B CORE ANTIBODY, TOTAL: Hep B Core Total Ab: NONREACTIVE

## 2024-12-06 LAB — QUANTIFERON-TB GOLD PLUS
Mitogen-NIL: 4.12 [IU]/mL
NIL: 0.01 [IU]/mL
QuantiFERON-TB Gold Plus: NEGATIVE
TB1-NIL: 0 [IU]/mL
TB2-NIL: 0 [IU]/mL

## 2024-12-06 LAB — SEDIMENTATION RATE: Sed Rate: 6 mm/h (ref 0–30)

## 2024-12-06 LAB — HEPATITIS B SURFACE ANTIGEN: Hepatitis B Surface Ag: NONREACTIVE

## 2024-12-07 ENCOUNTER — Other Ambulatory Visit (HOSPITAL_COMMUNITY): Payer: Self-pay

## 2024-12-07 ENCOUNTER — Telehealth: Payer: Self-pay | Admitting: Internal Medicine

## 2024-12-07 ENCOUNTER — Telehealth: Payer: Self-pay

## 2024-12-07 ENCOUNTER — Ambulatory Visit: Payer: Self-pay | Admitting: Internal Medicine

## 2024-12-07 NOTE — Telephone Encounter (Signed)
 Pharmacy Patient Advocate Encounter   Received notification from Pt Calls Messages that prior authorization for Rinvoq 45MG  er tablets is required/requested.   Insurance verification completed.   The patient is insured through [employer].   Per test claim: PA required; PA submitted to above mentioned insurance via Latent Key/confirmation #/[id number] [facility] Status is pending

## 2024-12-07 NOTE — Telephone Encounter (Signed)
 PA request has been Submitted. New Encounter has been or will be created for follow up. For additional info see Pharmacy Prior Auth telephone encounter from 12-07-2024.

## 2024-12-07 NOTE — Telephone Encounter (Signed)
 Prior authorization request for Rinvoq for Crohn's disease

## 2024-12-10 ENCOUNTER — Other Ambulatory Visit (HOSPITAL_COMMUNITY): Payer: Self-pay

## 2024-12-10 NOTE — Telephone Encounter (Signed)
 Pharmacy Patient Advocate Encounter  Received notification from [facility] [employer] that Prior Authorization for Rinvoq 45MG  er tablets has been APPROVED from 12-07-2024 to 12-07-2025   PA #/Case ID/Reference #: [id number]

## 2024-12-16 NOTE — Telephone Encounter (Signed)
 Maintenance dose will be 15 mg daily Rinvoq

## 2024-12-17 ENCOUNTER — Other Ambulatory Visit (HOSPITAL_COMMUNITY): Payer: Self-pay

## 2024-12-21 NOTE — Telephone Encounter (Signed)
 What do you recommend?

## 2024-12-21 NOTE — Telephone Encounter (Signed)
 She can stop the Entyvio .  Budesonide  is helping her more than anything right now I think.

## 2024-12-21 NOTE — Telephone Encounter (Signed)
 Spoke with patient.  Instructed her to stop the Entyvio  while waiting for the Rinvoq.

## 2024-12-21 NOTE — Telephone Encounter (Signed)
 Patient would like to know if they should attend appointment on 2/10 for injection or reschedule due to not having Rinvoq. Please Advise, Thank you.

## 2024-12-25 ENCOUNTER — Ambulatory Visit
